# Patient Record
Sex: Male | Born: 1945 | Race: White | Hispanic: No | Smoking: Current every day smoker
Health system: Southern US, Community
[De-identification: ages and names within clinical notes are randomized; demographics above are authoritative.]

## PROBLEM LIST (undated history)

## (undated) DIAGNOSIS — F1010 Alcohol abuse, uncomplicated: Secondary | ICD-10-CM

## (undated) DIAGNOSIS — F0390 Unspecified dementia without behavioral disturbance: Secondary | ICD-10-CM

## (undated) DIAGNOSIS — J449 Chronic obstructive pulmonary disease, unspecified: Secondary | ICD-10-CM

## (undated) DIAGNOSIS — E46 Unspecified protein-calorie malnutrition: Secondary | ICD-10-CM

## (undated) DIAGNOSIS — K7460 Unspecified cirrhosis of liver: Secondary | ICD-10-CM

## (undated) DIAGNOSIS — C140 Malignant neoplasm of pharynx, unspecified: Secondary | ICD-10-CM

## (undated) DIAGNOSIS — B1920 Unspecified viral hepatitis C without hepatic coma: Secondary | ICD-10-CM

## (undated) HISTORY — PX: TONSILLECTOMY: SUR1361

## (undated) HISTORY — PX: VASECTOMY: SHX75

---

## 2006-09-09 ENCOUNTER — Emergency Department: Payer: Self-pay | Admitting: Emergency Medicine

## 2009-09-26 ENCOUNTER — Inpatient Hospital Stay: Payer: Self-pay | Admitting: Psychiatry

## 2009-10-01 ENCOUNTER — Ambulatory Visit: Payer: Self-pay | Admitting: Unknown Physician Specialty

## 2011-03-28 LAB — TSH: Thyroid Stimulating Horm: 4.84 u[IU]/mL — ABNORMAL HIGH

## 2011-03-28 LAB — ACETAMINOPHEN LEVEL: Acetaminophen: <2 ug/mL

## 2011-03-28 LAB — URINALYSIS, COMPLETE
Bacteria: NONE SEEN
Blood: NEGATIVE
Nitrite: NEGATIVE
Ph: 5 (ref 4.5–8.0)
Specific Gravity: 1.025 (ref 1.003–1.030)
Squamous Epithelial: 1

## 2011-03-28 LAB — CBC
HCT: 43.2 % (ref 40.0–52.0)
HGB: 15.1 g/dL (ref 13.0–18.0)
MCH: 34.4 pg — ABNORMAL HIGH (ref 26.0–34.0)
MCHC: 35 g/dL (ref 32.0–36.0)
MCV: 98 fL (ref 80–100)
RDW: 14.3 % (ref 11.5–14.5)
WBC: 6.2 10*3/uL (ref 3.8–10.6)

## 2011-03-28 LAB — COMPREHENSIVE METABOLIC PANEL
Alkaline Phosphatase: 91 U/L (ref 50–136)
Anion Gap: 15 (ref 7–16)
BUN: 6 mg/dL — ABNORMAL LOW (ref 7–18)
Bilirubin,Total: 1.3 mg/dL — ABNORMAL HIGH (ref 0.2–1.0)
Chloride: 96 mmol/L — ABNORMAL LOW (ref 98–107)
Creatinine: 0.67 mg/dL (ref 0.60–1.30)
EGFR (African American): >60
EGFR (Non-African Amer.): >60
Osmolality: 262 (ref 275–301)
SGOT(AST): 292 U/L — ABNORMAL HIGH (ref 15–37)
SGPT (ALT): 195 U/L — ABNORMAL HIGH
Total Protein: 8.2 g/dL (ref 6.4–8.2)

## 2011-03-28 LAB — DRUG SCREEN, URINE
Barbiturates, Ur Screen: NEGATIVE (ref ?–200)
Benzodiazepine, Ur Scrn: NEGATIVE (ref ?–200)
Cannabinoid 50 Ng, Ur ~~LOC~~: POSITIVE (ref ?–50)
Methadone, Ur Screen: NEGATIVE (ref ?–300)
Opiate, Ur Screen: NEGATIVE (ref ?–300)
Phencyclidine (PCP) Ur S: NEGATIVE (ref ?–25)

## 2011-03-28 LAB — ETHANOL
Ethanol %: 0.054 % (ref 0.000–0.080)
Ethanol: 54 mg/dL

## 2011-03-28 LAB — SALICYLATE LEVEL: Salicylates, Serum: 3.2 mg/dL — ABNORMAL HIGH

## 2011-03-29 ENCOUNTER — Inpatient Hospital Stay: Payer: Self-pay | Admitting: Psychiatry

## 2011-04-02 LAB — BASIC METABOLIC PANEL WITH GFR
Anion Gap: 7
BUN: 7 mg/dL
Calcium, Total: 8.8 mg/dL
Chloride: 102 mmol/L
Co2: 26 mmol/L
Creatinine: 0.63 mg/dL
EGFR (African American): >60
EGFR (Non-African Amer.): >60
Glucose: 95 mg/dL
Osmolality: 268
Potassium: 3.3 mmol/L — ABNORMAL LOW
Sodium: 135 mmol/L — ABNORMAL LOW

## 2011-04-02 LAB — HEPATIC FUNCTION PANEL A (ARMC)
Bilirubin,Total: 1.1 mg/dL — ABNORMAL HIGH (ref 0.2–1.0)
SGOT(AST): 164 U/L — ABNORMAL HIGH (ref 15–37)
SGPT (ALT): 144 U/L — ABNORMAL HIGH

## 2011-04-02 LAB — TSH: Thyroid Stimulating Horm: 7.37 u[IU]/mL — ABNORMAL HIGH

## 2011-04-13 ENCOUNTER — Emergency Department: Payer: Self-pay | Admitting: Emergency Medicine

## 2011-04-13 LAB — COMPREHENSIVE METABOLIC PANEL
Albumin: 3 g/dL — ABNORMAL LOW (ref 3.4–5.0)
Alkaline Phosphatase: 119 U/L (ref 50–136)
Anion Gap: 10 (ref 7–16)
BUN: 4 mg/dL — ABNORMAL LOW (ref 7–18)
Bilirubin,Total: 0.6 mg/dL (ref 0.2–1.0)
Calcium, Total: 8.4 mg/dL — ABNORMAL LOW (ref 8.5–10.1)
Co2: 25 mmol/L (ref 21–32)
EGFR (Non-African Amer.): >60
Glucose: 129 mg/dL — ABNORMAL HIGH (ref 65–99)
SGPT (ALT): 106 U/L — ABNORMAL HIGH
Sodium: 143 mmol/L (ref 136–145)
Total Protein: 6.9 g/dL (ref 6.4–8.2)

## 2011-04-13 LAB — URINALYSIS, COMPLETE
Bacteria: NONE SEEN
Blood: NEGATIVE
Leukocyte Esterase: NEGATIVE
Nitrite: NEGATIVE
Ph: 5 (ref 4.5–8.0)
RBC,UR: 1 /HPF (ref 0–5)
Specific Gravity: 1.023 (ref 1.003–1.030)

## 2011-04-13 LAB — DRUG SCREEN, URINE
Barbiturates, Ur Screen: NEGATIVE (ref ?–200)
Benzodiazepine, Ur Scrn: NEGATIVE (ref ?–200)
Cannabinoid 50 Ng, Ur ~~LOC~~: NEGATIVE (ref ?–50)
Methadone, Ur Screen: NEGATIVE (ref ?–300)
Opiate, Ur Screen: NEGATIVE (ref ?–300)
Phencyclidine (PCP) Ur S: NEGATIVE (ref ?–25)
Tricyclic, Ur Screen: NEGATIVE (ref ?–1000)

## 2011-04-13 LAB — TSH: Thyroid Stimulating Horm: 7.03 u[IU]/mL — ABNORMAL HIGH

## 2011-04-13 LAB — CBC
MCH: 34.1 pg — ABNORMAL HIGH (ref 26.0–34.0)
Platelet: 299 10*3/uL (ref 150–440)
RBC: 3.81 10*6/uL — ABNORMAL LOW (ref 4.40–5.90)
RDW: 14.2 % (ref 11.5–14.5)
WBC: 3.8 10*3/uL (ref 3.8–10.6)

## 2011-10-06 ENCOUNTER — Inpatient Hospital Stay: Payer: Self-pay | Admitting: Unknown Physician Specialty

## 2011-10-06 LAB — COMPREHENSIVE METABOLIC PANEL
Albumin: 3.1 g/dL — ABNORMAL LOW (ref 3.4–5.0)
Anion Gap: 6 — ABNORMAL LOW (ref 7–16)
BUN: 6 mg/dL — ABNORMAL LOW (ref 7–18)
Calcium, Total: 8.7 mg/dL (ref 8.5–10.1)
Chloride: 99 mmol/L (ref 98–107)
Creatinine: 0.89 mg/dL (ref 0.60–1.30)
EGFR (African American): >60
Glucose: 94 mg/dL (ref 65–99)
Potassium: 3.1 mmol/L — ABNORMAL LOW (ref 3.5–5.1)
SGOT(AST): 197 U/L — ABNORMAL HIGH (ref 15–37)
Sodium: 135 mmol/L — ABNORMAL LOW (ref 136–145)

## 2011-10-06 LAB — DRUG SCREEN, URINE
Amphetamines, Ur Screen: NEGATIVE (ref ?–1000)
Barbiturates, Ur Screen: NEGATIVE (ref ?–200)
Cannabinoid 50 Ng, Ur ~~LOC~~: POSITIVE (ref ?–50)
Cocaine Metabolite,Ur ~~LOC~~: POSITIVE (ref ?–300)
Methadone, Ur Screen: NEGATIVE (ref ?–300)
Opiate, Ur Screen: NEGATIVE (ref ?–300)
Phencyclidine (PCP) Ur S: NEGATIVE (ref ?–25)
Tricyclic, Ur Screen: NEGATIVE (ref ?–1000)

## 2011-10-06 LAB — CBC
HGB: 13.7 g/dL (ref 13.0–18.0)
MCH: 34.6 pg — ABNORMAL HIGH (ref 26.0–34.0)
MCV: 101 fL — ABNORMAL HIGH (ref 80–100)
Platelet: 158 10*3/uL (ref 150–440)
RBC: 3.95 10*6/uL — ABNORMAL LOW (ref 4.40–5.90)

## 2011-10-06 LAB — URINALYSIS, COMPLETE
Bilirubin,UR: NEGATIVE
Blood: NEGATIVE
Ketone: NEGATIVE
Ph: 6 (ref 4.5–8.0)
Squamous Epithelial: NONE SEEN

## 2011-10-06 LAB — ETHANOL: Ethanol: <3 mg/dL

## 2011-10-06 LAB — TROPONIN I: Troponin-I: <0.02 ng/mL

## 2011-10-06 LAB — SALICYLATE LEVEL: Salicylates, Serum: <1.7 mg/dL

## 2011-10-06 LAB — ACETAMINOPHEN LEVEL: Acetaminophen: <2 ug/mL

## 2012-08-29 ENCOUNTER — Inpatient Hospital Stay: Payer: Self-pay | Admitting: Family Medicine

## 2012-08-29 LAB — COMPREHENSIVE METABOLIC PANEL
Anion Gap: 17 — ABNORMAL HIGH (ref 7–16)
BUN: 7 mg/dL (ref 7–18)
Bilirubin,Total: 4.3 mg/dL — ABNORMAL HIGH (ref 0.2–1.0)
Calcium, Total: 8.3 mg/dL — ABNORMAL LOW (ref 8.5–10.1)
Chloride: 91 mmol/L — ABNORMAL LOW (ref 98–107)
Creatinine: 0.71 mg/dL (ref 0.60–1.30)
EGFR (African American): >60
EGFR (Non-African Amer.): >60
Glucose: 73 mg/dL (ref 65–99)
SGPT (ALT): 64 U/L (ref 12–78)
Sodium: 126 mmol/L — ABNORMAL LOW (ref 136–145)
Total Protein: 7.5 g/dL (ref 6.4–8.2)

## 2012-08-29 LAB — URINALYSIS, COMPLETE
Blood: NEGATIVE
Hyaline Cast: 4
Leukocyte Esterase: NEGATIVE
Ph: 6 (ref 4.5–8.0)
Protein: 30
RBC,UR: 2 /HPF (ref 0–5)
Specific Gravity: 1.026 (ref 1.003–1.030)
Squamous Epithelial: <1

## 2012-08-29 LAB — DRUG SCREEN, URINE
Benzodiazepine, Ur Scrn: NEGATIVE (ref ?–200)
Cannabinoid 50 Ng, Ur ~~LOC~~: NEGATIVE (ref ?–50)
Cocaine Metabolite,Ur ~~LOC~~: NEGATIVE (ref ?–300)
MDMA (Ecstasy)Ur Screen: NEGATIVE (ref ?–500)
Methadone, Ur Screen: NEGATIVE (ref ?–300)
Opiate, Ur Screen: NEGATIVE (ref ?–300)
Phencyclidine (PCP) Ur S: NEGATIVE (ref ?–25)

## 2012-08-29 LAB — CBC
HCT: 34.6 % — ABNORMAL LOW (ref 40.0–52.0)
MCHC: 35.2 g/dL (ref 32.0–36.0)
Platelet: 86 10*3/uL — ABNORMAL LOW (ref 150–440)
RBC: 3.33 10*6/uL — ABNORMAL LOW (ref 4.40–5.90)
WBC: 7.1 10*3/uL (ref 3.8–10.6)

## 2012-08-29 LAB — TSH: Thyroid Stimulating Horm: 3.03 u[IU]/mL

## 2012-08-29 LAB — LIPASE, BLOOD: Lipase: 191 U/L (ref 73–393)

## 2012-08-29 LAB — TROPONIN I: Troponin-I: <0.02 ng/mL

## 2012-08-29 LAB — ETHANOL
Ethanol %: 0.063 % (ref 0.000–0.080)
Ethanol: 63 mg/dL

## 2012-08-30 LAB — BASIC METABOLIC PANEL
BUN: 9 mg/dL (ref 7–18)
Co2: 25 mmol/L (ref 21–32)
Creatinine: 0.76 mg/dL (ref 0.60–1.30)
EGFR (African American): >60
EGFR (Non-African Amer.): >60
Osmolality: 261 (ref 275–301)
Potassium: 3.5 mmol/L (ref 3.5–5.1)

## 2012-08-30 LAB — CBC WITH DIFFERENTIAL/PLATELET
Basophil %: 0.5 %
Eosinophil #: 0 10*3/uL (ref 0.0–0.7)
Eosinophil %: 0.6 %
HCT: 29.8 % — ABNORMAL LOW (ref 40.0–52.0)
HGB: 10.6 g/dL — ABNORMAL LOW (ref 13.0–18.0)
Lymphocyte %: 25 %
MCH: 37.1 pg — ABNORMAL HIGH (ref 26.0–34.0)
MCHC: 35.5 g/dL (ref 32.0–36.0)
Monocyte #: 0.7 x10 3/mm (ref 0.2–1.0)
Neutrophil #: 3.1 10*3/uL (ref 1.4–6.5)
Platelet: 74 10*3/uL — ABNORMAL LOW (ref 150–440)
RBC: 2.86 10*6/uL — ABNORMAL LOW (ref 4.40–5.90)
RDW: 13.7 % (ref 11.5–14.5)

## 2012-08-30 LAB — PROTIME-INR
INR: 2
Prothrombin Time: 22.2 secs — ABNORMAL HIGH (ref 11.5–14.7)

## 2012-08-30 LAB — HEPATIC FUNCTION PANEL A (ARMC)
Albumin: 2.1 g/dL — ABNORMAL LOW (ref 3.4–5.0)
Alkaline Phosphatase: 123 U/L (ref 50–136)
SGOT(AST): 102 U/L — ABNORMAL HIGH (ref 15–37)
Total Protein: 6.4 g/dL (ref 6.4–8.2)

## 2012-08-30 LAB — AMMONIA: Ammonia, Plasma: 47 mcmol/L — ABNORMAL HIGH (ref 11–32)

## 2012-08-31 LAB — CBC WITH DIFFERENTIAL/PLATELET
Basophil #: 0 10*3/uL (ref 0.0–0.1)
Basophil %: 0.4 %
Eosinophil #: 0 10*3/uL (ref 0.0–0.7)
Eosinophil %: 0.5 %
HGB: 11.4 g/dL — ABNORMAL LOW (ref 13.0–18.0)
Lymphocyte #: 0.8 10*3/uL — ABNORMAL LOW (ref 1.0–3.6)
MCH: 36.9 pg — ABNORMAL HIGH (ref 26.0–34.0)
Neutrophil %: 68.4 %
RBC: 3.08 10*6/uL — ABNORMAL LOW (ref 4.40–5.90)
RDW: 14 % (ref 11.5–14.5)
WBC: 4.2 10*3/uL (ref 3.8–10.6)

## 2012-08-31 LAB — SODIUM: Sodium: 134 mmol/L — ABNORMAL LOW (ref 136–145)

## 2012-08-31 LAB — PROTIME-INR: INR: 1.8

## 2012-09-04 LAB — CBC WITH DIFFERENTIAL/PLATELET
Basophil #: 0.1 10*3/uL (ref 0.0–0.1)
Eosinophil %: 1.5 %
HCT: 33.4 % — ABNORMAL LOW (ref 40.0–52.0)
HGB: 11.5 g/dL — ABNORMAL LOW (ref 13.0–18.0)
Lymphocyte %: 34.1 %
MCH: 36.8 pg — ABNORMAL HIGH (ref 26.0–34.0)
MCHC: 34.3 g/dL (ref 32.0–36.0)
MCV: 107 fL — ABNORMAL HIGH (ref 80–100)
Monocyte #: 1.4 x10 3/mm — ABNORMAL HIGH (ref 0.2–1.0)
Neutrophil #: 3.1 10*3/uL (ref 1.4–6.5)
WBC: 7.1 10*3/uL (ref 3.8–10.6)

## 2012-09-04 LAB — BASIC METABOLIC PANEL
Anion Gap: 6 — ABNORMAL LOW (ref 7–16)
Chloride: 109 mmol/L — ABNORMAL HIGH (ref 98–107)
Creatinine: 0.72 mg/dL (ref 0.60–1.30)
Glucose: 108 mg/dL — ABNORMAL HIGH (ref 65–99)
Potassium: 3.5 mmol/L (ref 3.5–5.1)
Sodium: 138 mmol/L (ref 136–145)

## 2012-09-05 LAB — PLATELET COUNT: Platelet: 106 10*3/uL — ABNORMAL LOW (ref 150–440)

## 2012-09-05 LAB — PROTIME-INR: Prothrombin Time: 21.2 secs — ABNORMAL HIGH (ref 11.5–14.7)

## 2012-09-06 LAB — BASIC METABOLIC PANEL
Co2: 23 mmol/L (ref 21–32)
Creatinine: 0.66 mg/dL (ref 0.60–1.30)
EGFR (African American): >60
EGFR (Non-African Amer.): >60
Glucose: 94 mg/dL (ref 65–99)
Osmolality: 267 (ref 275–301)
Sodium: 135 mmol/L — ABNORMAL LOW (ref 136–145)

## 2012-09-06 LAB — BODY FLUID CELL COUNT WITH DIFFERENTIAL
Basophil: 0 %
Neutrophils: 5 %
Other Cells BF: 0 %

## 2012-09-06 LAB — PROTIME-INR
INR: 1.8
Prothrombin Time: 20.3 secs — ABNORMAL HIGH (ref 11.5–14.7)

## 2012-09-06 LAB — PROTEIN, BODY FLUID: Protein, Body Fluid: 0.8 g/dL

## 2012-09-06 LAB — ALBUMIN, FLUID (OTHER): Body Fluid Albumin: <0.6 g/dL

## 2012-09-07 LAB — PROTIME-INR
INR: 1.7
Prothrombin Time: 19.5 secs — ABNORMAL HIGH (ref 11.5–14.7)

## 2012-09-07 LAB — APTT: Activated PTT: 37.9 secs — ABNORMAL HIGH (ref 23.6–35.9)

## 2012-09-22 ENCOUNTER — Inpatient Hospital Stay: Payer: Self-pay | Admitting: Internal Medicine

## 2012-09-22 LAB — URINALYSIS, COMPLETE
Bacteria: NONE SEEN
Blood: NEGATIVE
Glucose,UR: NEGATIVE mg/dL (ref 0–75)
Nitrite: NEGATIVE
RBC,UR: 5 /HPF (ref 0–5)

## 2012-09-22 LAB — COMPREHENSIVE METABOLIC PANEL
Albumin: 1.8 g/dL — ABNORMAL LOW (ref 3.4–5.0)
Alkaline Phosphatase: 154 U/L — ABNORMAL HIGH (ref 50–136)
Anion Gap: 3 — ABNORMAL LOW (ref 7–16)
BUN: 6 mg/dL — ABNORMAL LOW (ref 7–18)
Bilirubin,Total: 0.9 mg/dL (ref 0.2–1.0)
Calcium, Total: 8.4 mg/dL — ABNORMAL LOW (ref 8.5–10.1)
Chloride: 105 mmol/L (ref 98–107)
Co2: 29 mmol/L (ref 21–32)
Creatinine: 0.71 mg/dL (ref 0.60–1.30)
EGFR (African American): >60
Glucose: 97 mg/dL (ref 65–99)
Osmolality: 271 (ref 275–301)
Potassium: 3.7 mmol/L (ref 3.5–5.1)
Sodium: 137 mmol/L (ref 136–145)

## 2012-09-22 LAB — CBC
MCH: 36.5 pg — ABNORMAL HIGH (ref 26.0–34.0)
Platelet: 181 10*3/uL (ref 150–440)
RDW: 14.7 % — ABNORMAL HIGH (ref 11.5–14.5)
WBC: 7.6 10*3/uL (ref 3.8–10.6)

## 2012-09-22 LAB — PROTIME-INR
INR: 1.4
Prothrombin Time: 17.5 secs — ABNORMAL HIGH (ref 11.5–14.7)

## 2012-09-22 LAB — ETHANOL
Ethanol %: <0.003 % (ref 0.000–0.080)
Ethanol: <3 mg/dL

## 2012-09-22 LAB — MAGNESIUM: Magnesium: 1.8 mg/dL

## 2012-09-22 LAB — TROPONIN I: Troponin-I: <0.02 ng/mL

## 2012-09-23 LAB — CBC WITH DIFFERENTIAL/PLATELET
Basophil #: 0 10*3/uL (ref 0.0–0.1)
Eosinophil #: 0.1 10*3/uL (ref 0.0–0.7)
HCT: 30.1 % — ABNORMAL LOW (ref 40.0–52.0)
Lymphocyte #: 2.2 10*3/uL (ref 1.0–3.6)
Lymphocyte %: 33.1 %
MCH: 36.6 pg — ABNORMAL HIGH (ref 26.0–34.0)
MCV: 105 fL — ABNORMAL HIGH (ref 80–100)
Monocyte %: 15.7 %
Neutrophil #: 3.3 10*3/uL (ref 1.4–6.5)
Platelet: 147 10*3/uL — ABNORMAL LOW (ref 150–440)
RDW: 14.5 % (ref 11.5–14.5)
WBC: 6.8 10*3/uL (ref 3.8–10.6)

## 2012-09-23 LAB — COMPREHENSIVE METABOLIC PANEL
Albumin: 2.1 g/dL — ABNORMAL LOW (ref 3.4–5.0)
Alkaline Phosphatase: 120 U/L (ref 50–136)
Calcium, Total: 8.3 mg/dL — ABNORMAL LOW (ref 8.5–10.1)
Chloride: 105 mmol/L (ref 98–107)
Co2: 30 mmol/L (ref 21–32)
EGFR (African American): >60
Potassium: 3.8 mmol/L (ref 3.5–5.1)
SGPT (ALT): 45 U/L (ref 12–78)
Total Protein: 6 g/dL — ABNORMAL LOW (ref 6.4–8.2)

## 2012-09-23 LAB — LIPID PANEL
Cholesterol: 91 mg/dL (ref 0–200)
HDL Cholesterol: 18 mg/dL — ABNORMAL LOW (ref 40–60)
Ldl Cholesterol, Calc: 64 mg/dL (ref 0–100)
Triglycerides: 45 mg/dL (ref 0–200)
VLDL Cholesterol, Calc: 9 mg/dL (ref 5–40)

## 2012-09-23 LAB — PROTIME-INR: Prothrombin Time: 19.6 secs — ABNORMAL HIGH (ref 11.5–14.7)

## 2012-09-23 LAB — TSH: Thyroid Stimulating Horm: 9.54 u[IU]/mL — ABNORMAL HIGH

## 2012-09-23 LAB — MAGNESIUM: Magnesium: 2 mg/dL

## 2012-09-24 LAB — BASIC METABOLIC PANEL
Anion Gap: 6 — ABNORMAL LOW (ref 7–16)
Calcium, Total: 8.5 mg/dL (ref 8.5–10.1)
EGFR (African American): >60
EGFR (Non-African Amer.): >60
Osmolality: 273 (ref 275–301)
Potassium: 3.7 mmol/L (ref 3.5–5.1)

## 2012-09-24 LAB — SODIUM, URINE, RANDOM: Sodium, Urine Random: 148 mmol/L (ref 20–110)

## 2012-09-24 LAB — PROTIME-INR: INR: 1.6

## 2012-09-25 LAB — PLATELET COUNT: Platelet: 155 10*3/uL (ref 150–440)

## 2012-09-25 LAB — PROTIME-INR
INR: 1.6
Prothrombin Time: 19.2 secs — ABNORMAL HIGH (ref 11.5–14.7)

## 2012-09-25 LAB — BASIC METABOLIC PANEL
Calcium, Total: 8.2 mg/dL — ABNORMAL LOW (ref 8.5–10.1)
Chloride: 103 mmol/L (ref 98–107)
Co2: 29 mmol/L (ref 21–32)
Creatinine: 0.61 mg/dL (ref 0.60–1.30)
EGFR (African American): >60
Glucose: 95 mg/dL (ref 65–99)
Osmolality: 267 (ref 275–301)
Potassium: 3.7 mmol/L (ref 3.5–5.1)

## 2012-09-25 LAB — HEMOGLOBIN: HGB: 10.3 g/dL — ABNORMAL LOW (ref 13.0–18.0)

## 2012-09-26 LAB — BASIC METABOLIC PANEL
Chloride: 101 mmol/L (ref 98–107)
Co2: 31 mmol/L (ref 21–32)
Creatinine: 0.71 mg/dL (ref 0.60–1.30)
EGFR (African American): >60
EGFR (Non-African Amer.): >60
Glucose: 85 mg/dL (ref 65–99)
Osmolality: 270 (ref 275–301)
Sodium: 137 mmol/L (ref 136–145)

## 2012-09-26 LAB — PROTIME-INR
INR: 1.4
Prothrombin Time: 17.6 secs — ABNORMAL HIGH (ref 11.5–14.7)

## 2012-09-27 LAB — MAGNESIUM: Magnesium: 1.4 mg/dL — ABNORMAL LOW

## 2012-09-27 LAB — POTASSIUM: Potassium: 3.7 mmol/L (ref 3.5–5.1)

## 2013-02-03 ENCOUNTER — Ambulatory Visit: Payer: Self-pay | Admitting: Unknown Physician Specialty

## 2013-05-17 ENCOUNTER — Ambulatory Visit: Payer: Self-pay | Admitting: Unknown Physician Specialty

## 2013-12-28 ENCOUNTER — Ambulatory Visit: Payer: Self-pay | Admitting: Unknown Physician Specialty

## 2014-06-05 NOTE — Discharge Summary (Signed)
PATIENT NAME:  [name], [name] MR#:  [id number] DATE OF BIRTH:  1945-04-11  DATE OF ADMISSION:  10/06/2011 DATE OF DISCHARGE:  10/15/2011  HISTORY OF PRESENT ILLNESS: The patient was admitted with alcohol dependence. For further details, please see typed attached History and Physical.   ACCESSORY CLINICAL DATA: Acetaminophen was negative. BUN, creatinine, and electrolytes within normal limits with sodium 135, potassium 3.1. Liver function tests: Slightly elevated alkaline phosphatase at 138, SGPT 142, SGOT 197, albumin 3.1, otherwise liver function tests within normal limits. Calcium within normal limits. Ethanol blood level was less than 0.03.  CBC showed hemoglobin 13.7, hematocrit 39.8, otherwise within normal limits. Salicylates less than 1.7. TSH 4.1, within normal limits. Drug screen positive for cocaine and cannabinoids.  Urinalysis is within normal limits. Troponin less than 0.2? EKG was within normal limits.   HOSPITAL COURSE: The patient was withdrawn from alcohol using Ativan, thiamine, vitamins, etc.  Alcohol withdrawal with somewhat prolonged primarily because of dystaxia which had about subsided by the time of discharge. At the time of discharge he was no longer dystaxic or shaky. He agreed to follow up at mental health, did not desire inpatient rehab. He did admit to using cocaine and marijuana, but he reports just doing it rarely.   DIAGNOSES:  AXIS I:  Alcohol dependence, severe.  Marijuana and cocaine abuse.   AXIS III: Alcohol withdrawal state. Alcoholic liver disease.   CONDITION ON DISCHARGE: Good.   DISPOSITION: The patient will be seen at [facility]. He will follow up with his medical doctor.  MEDICATIONS:  No medications.     DIET AND ACTIVITY: As tolerated.     ____________________________ Tanna Furry, MD wjr:bjt D: 10/14/2011 15:12:24 ET T: 10/15/2011 12:05:42 ET JOB#: [id number]  cc: Tanna Furry, MD, <Dictator> Lew Dawes MD ELECTRONICALLY SIGNED 10/16/2011 14:50

## 2014-06-05 NOTE — Consult Note (Signed)
Patient seen at request of Baldo Ash, RN, LCAS for evaluation for participation in the CD-IOP after this inpatient Discharge. He reported that he has Antabuse at home and that he plans to use it to assist with alcoholic beverage free life. He was also able to express that he does not desire treatment in the CD-IOP at this time and also does not have the transportation to travel to [facility]. Persons with substance use disorders who are not motivated to change may not benefit from or participate in intensive treatment interventions unless their motivation improves. Such was the case with this patient. Assigned nursed informed of patient?s decision to not attend the CD-IOP.    Electronic Signatures: Laqueta Due (PsyD)  (Signed on 29-Aug-13 14:14)  Authored  Last Updated: 29-Aug-13 14:14 by Laqueta Due (PsyD)

## 2014-06-05 NOTE — H&P (Signed)
PATIENT NAME:  [name], [name] MR#:  [id number] DATE OF BIRTH:  [birth year]  DATE OF ADMISSION:  10/06/2011  CHIEF COMPLAINT: Drinking.   HISTORY OF PRESENT ILLNESS: This is one of several psychiatric hospitalizations for this 69 year old white single male who is admitted from the Emergency Room. The patient has a long-term substance abuse history, primarily alcohol, and has had periods of sobriety in the past. He was last hospitalized here in February of 2013, at that time discharged on Antabuse, which he subsequently stopped taking. He has taken naltrexone in the past. He, again, has had periods of time of up to a year of sobriety.    The patient came into the Emergency Room last night stating that he lives in his mother's house. She is in a nursing home. His sister took him to [place], who gave him a bunch of doctors' numbers to call. The patient was then brought to the Emergency Room. In the Emergency Room last night, he stated he was out of money. He drinks 12 beers per day, needs some nerve pills and some detox. The patient ran out of money and food, and his sister brought him to the Emergency Room. The patient also has some history of cocaine and marijuana abuse, and they are both positive in his urine, and this may be playing a factor.   Anyway, it is estimated that he has been drinking a 12-pack a day for some time, perhaps since last March or February. He does have blackouts. He has history of DWIs. He was in jail for six months for DWI, getting out in January of 2013, and he does have delirium when coming off alcohol. The patient also becomes extremely drowsy and noncommunicative when coming off alcohol. This has been reported in previous charts.   FAMILY HISTORY: Not known at this time. The patient is currently unable to give me a history as he is getting quite sleepy and is slightly disheveled. Nurses do report that this morning he was also incontinent as well as dystaxic with gait. He has been  given Ativan 2 mg at least 2 or 3 times.   PAST MEDICAL HISTORY: Past history is fairly unremarkable. He has a history of alcoholic hepatitis, and that apparently is all. He takes no current medications.   REVIEW OF SYSTEMS: Review of systems, according to the patient, is negative; but it is very difficult as he is a very poor historian at this point in time.  SOCIAL HISTORY: The patient currently lives in his mother's house by himself. He does get Fish farm manager. He is not married and apparently has no children. He does smoke cigarettes as well as abusing drugs.   ALLERGIES: Penicillin causes hives.   MENTAL STATUS EXAM: Mental status at this time revealed a drowsy white male. He could be aroused and awakened but was slightly irritable and was a very poor historian at this time. It was difficult to test for orientation, memory; and he did not appear to be hallucinating and appeared perhaps slightly having some difficulty with attention. He a couple of times he just wanted to sleep, and he was again slightly hard to arouse.   PHYSICAL EXAMINATION:  VITAL SIGNS: Vital signs at this time are stable with normal blood pressure, pulse and respirations.   HEENT: Pupils equal, round, and reactive to light and accommodation. Throat is clear.   NECK: Supple without masses.   CHEST: Clear.   CARDIAC: Markedly distant heart sounds.   ABDOMEN: Soft without tenderness,  masses, or organomegaly. Bowel sounds are hypoactive.   NEUROLOGICAL: The patient had sustention tremor on finger-to-nose. I did not test gait because I could not get him up. Reflexes were 2 to 3+ and symmetrical with no pathological reflexes.   IMPRESSION:  AXIS I:  1. Alcohol dependence, severe. 2. Marijuana abuse.  3. Cocaine abuse.   AXIS II: None.    AXIS III:  1. Alcohol withdrawal state.   2. History of alcoholic liver disease.   AXIS IV: Living alone.   AXIS V: 20. The patient is going through withdrawal at this  point in time.   ASSESSMENT AND PLAN: The patient will be continued on CIWA. We will watch giving too much Ativan, but he is going to need some because he has got a history of DTs in the past, and we will try to monitor his vital signs.   ____________________________ Tanna Furry, MD wjr:cbb D: 10/07/2011 12:05:04 ET T: 10/07/2011 12:22:18 ET JOB#: [id number]  cc: Tanna Furry, MD, <Dictator> Lew Dawes MD ELECTRONICALLY SIGNED 10/07/2011 16:39

## 2014-06-08 NOTE — H&P (Signed)
PATIENT NAME:  [name], [name] MR#:  [id number] DATE OF BIRTH:  21-Aug-1945  DATE OF ADMISSION:  08/29/2012  PRIMARY CAR PHYSICIAN:  None.    CHIEF COMPLAINT: I want to get help for alcohol.   HISTORY OF PRESENT ILLNESS: This is a 69 year old man who came in wanting to get help for alcohol. He drinks a 12 pack of beer per day and a few bottles of wine per day. He states that he does have some frequent falls, especially when he is not drinking. In the ER, he was found to have a low sodium of 126 and acidotic, with CO2 18 and an anion gap 17. The ER physician asked for medical admission because of the acidosis and hyponatremia. They also did place a Foley in the Emergency Room secondary to urinary retention, but only 100 mL came out. The patient is having a lot of pain with that and wants removed.   PAST MEDICAL HISTORY: Alcohol abuse. Frequent falls, anxiety.   PAST SURGICAL HISTORY: On the palate bone.   ALLERGIES: PENICILLIN.   MEDICATIONS ON A DAILY BASIS:  None.   SOCIAL HISTORY: Smokes a 1+ pack per day. Alcohol: Drinks a 12 pack of beer per day plus a few bottles of wine. Drug use: Occasional crack cocaine. He does live alone. Used to work as a Brewing technologist.   FAMILY HISTORY: Father died of heart disease, also had chronic obstructive pulmonary disease. Mother and living in a nursing home with dementia.   REVIEW OF SYSTEMS:  CONSTITUTIONAL: Positive for weight loss. No fever, chills, or sweats. No weakness or fatigue.  EYES: He does wear glasses.  EARS, NOSE, MOUTH, AND THROAT: Decreased hearing. No sore throat. No difficulty swallowing.   CARDIOVASCULAR: No chest pain. No palpitation.  RESPIRATORY: No shortness of breath. No sputum. No hemoptysis.  GASTROINTESTINAL: No nausea. No vomiting. No abdominal pain. No diarrhea. No constipation. No bright red blood per rectum.  GENITOURINARY: Last night and today had some difficulty urinating.  MUSCULOSKELETAL: No joint pain.  INTEGUMENT:  No rashes or eruptions.  NEUROLOGIC: No fainting or blackouts, but unsteady gait with frequent falls when not drinking.  PSYCHIATRIC: Positive for anxiety.  ENDOCRINE: No thyroid problems.  HEMATOLOGIC/LYMPHATIC: No anemia.   PHYSICAL EXAMINATION: VITAL SIGNS: Temperature 97.6, pulse 86, respirations 18, blood pressure 128/67, pulse oximetry 98%.  GENERAL: No respiratory distress.  EYES: Conjunctivae and lids normal. Pupils equal, round, and reactive to light. Extraocular muscles intact. No nystagmus.  EARS, NOSE, MOUTH, AND THROAT: Tympanic membranes: No erythema. Nasal mucosa: No erythema. Throat:  No erythema, no exudate seen. Lips and gums: No lesions.  NECK: No JVD. No bruits. No lymphadenopathy. No thyromegaly. No thyroid nodules palpated.  LUNGS: Clear to auscultation. No use of accessory muscles to breathe. No rhonchi, rales, or wheeze heard.  CARDIOVASCULAR: S1, S2 normal. No gallops, rubs or murmurs heard. Carotid upstroke 2+ bilaterally. No bruits. Dorsalis pedis pulses 2+ bilaterally. No edema of lower extremity.  ABDOMEN: Soft, distended. No organomegaly/splenomegaly palpated. Normoactive bowel sounds. No masses felt.  LYMPHATIC: No lymph nodes in the neck.  MUSCULOSKELETAL: No clubbing, edema or cyanosis.  SKIN: Positive bruising upper and lower extremities.  NEUROLOGICAL: Cranial nerves II through XII grossly intact. Power 5/5 upper and lower extremities. Deep tendon reflexes 2+ bilateral lower extremity. Babinski negative.  PSYCHIATRIC: The patient is oriented to person, place and time. The patient states that his alcohol has run out.   LABORATORY AND RADIOLOGICAL DATA: Lipase 191. Troponin negative. TSH  3.03. Ethanol 63, glucose 73, BUN 7, creatinine 0.71, sodium 126, potassium 3.4, chloride 91, CO2 18, calcium 8.3, albumin 2.4, AST 128. White blood cell count 7.1, hemoglobin and hematocrit 12.2 and 34.3, platelet count of 86, MCV 104. Urine toxicology negative.   Urinalysis:  Protein of 30 mg/dL 1+ ketones, 1+ bilirubin.   EKG: Sinus tachycardia at 107 beats per minute.   ASSESSMENT AND PLAN: 1.  Metabolic acidosis. I believe this is all secondary to alcohol. We will give IV fluid hydration and recheck a chemistry in the morning. No need for arterial blood gas at this point.  2.  Hyponatremia secondary to beer. We will give IV fluid hydration with normal saline and continue monitor sodium in the morning.  3. Alcohol abuse. The patient is slowly killing himself with alcohol. I advised if he wants to be aboveground and living, he must stop the alcohol. I will put CIWA protocol since the patient is high risk for going through withdrawal.  4.  Likely cirrhosis with thrombocytopenia and elevated liver function tests. Must stop the alcohol.  5.  Urinary retention. Discontinue Foley since it is paining him,  start Flomax 0.4 mg daily and urecholine to stimulate urination.  6.  Tobacco abuse. Smoking cessation counseling done 3 minutes by me. Nicotine patch applied.  7.  Hypokalemia: We will replace potassium orally stat and at night and also magnesium supplementation and recheck electrolytes in the morning   TIME SPENT ON ADMISSION: 55 minutes.   CODE STATUS: The patient is a full code    ____________________________ Tana Conch. Leslye Peer, MD rjw:cc D: 08/29/2012 17:41:00 ET T: 08/29/2012 17:48:44 ET JOB#: [id number]  cc: Tana Conch. Leslye Peer, MD, <Dictator> Marisue Brooklyn MD ELECTRONICALLY SIGNED 08/30/2012 19:17

## 2014-06-08 NOTE — H&P (Signed)
PATIENT NAME:  [name], [name] MR#:  [id number] DATE OF BIRTH:  1945/03/22  DATE OF ADMISSION:  09/22/2012  PRIMARY CARE PHYSICIAN:  None.    REFERRING ER PHYSICIAN:  Dr. Debria Garret   CHIEF COMPLAINT:  Worsening abdominal distention and pain causing shortness of breath.   HISTORY OF PRESENT ILLNESS:  A 69 year old male with past medical history of alcohol abuse, frequent falls, anxiety, and alcoholic liver cirrhosis, recent admission to the hospital 3 weeks ago for the same issue, was sent to [facility] because of his generalized weakness for rehab and, as per him, his swelling continued getting worse and he had abdominal pain and now he started getting short of breath for the last night. He was sent to the ER from [facility] because of these issues. On further questioning, he denies any complaint of diarrhea, constipation, nausea/vomiting, or having any fever. His pain is constant, which is dull in the abdomen, and his swelling is getting worse. Now it has gone to his scrotum and his legs, and is causing him trouble breathing, too. Hospitalist service is contacted for further management of his case.   REVIEW OF SYSTEMS:  CONSTITUTIONAL: Negative for fever, fatigue. Positive for generalized weakness and abdominal pain. No weight loss. He has increased swelling all over his body.  EYES:  No blurring, double vision, pain or redness.  EARS, NOSE, THROAT:  No tinnitus, ear pain or hearing loss.  RESPIRATORY:  No cough, wheezing, hemoptysis or dyspnea, but he has shallow and slightly rapid breathing.  CARDIOVASCULAR:  No chest pain, orthopnea. He has significant edema in both his legs. High scrotum and abdomen, but no palpitations.  GASTROINTESTINAL:  No nausea, vomiting, diarrhea. Has abdominal pain and distention due to ascites.  GENITOURINARY:  No dysuria, hematuria. He has slightly decreased frequency of the urination.  ENDOCRINE: No polyuria, nocturia or heat or cold intolerance.  SKIN:  No  acne or rashes.  MUSCULOSKELETAL: No pain or swelling in the joints.  NEUROLOGICAL: No numbness, weakness or tremors  PAST MEDICAL HISTORY: Alcohol abuse, frequent falls, anxiety and ascites due to liver cirrhosis.   PAST SURGICAL HISTORY:  Surgery on palate bone.   ALLERGIES:  PENICILLIN.   SOCIAL HISTORY: He smokes 1 pack cigarettes per day. Alcohol, he was drinking a 12-pack of beer every day, plus a few bottles of wine, but since he was admitted last time to hospital and then sent to rehab, he did not have any drink. Occasional use of cocaine before previous admission to the hospital. He was living at home alone before last admission, and since last admission he was sent to [facility] for the last 2 weeks.  FAMILY HISTORY: Father died of heart disease. He had chronic obstructive pulmonary disease also. Mother living in a nursing home with dementia.   HOME MEDICATIONS:  1.  Tamsulosin 0.4 mg oral 2 times a day.  2.  Nicotine 21 mg patch. 3.  Nadolol 20 mg oral tablet 2 times a day.  4.  Multivitamin once a day.  5.  Magnesium oxide 400 mg 2 times a day.  6.  Lactulose 30 mL 2 times a day.  7.  Benzonatate 100 mg oral 4 times a day.   8.  Ativan 0.5 mg every 6 hours as needed for anxiety.   PHYSICAL EXAMINATION: VITAL SIGNS: In the ER, temperature 98.4, pulse 78, respirations 20, blood pressure 105/70, pulse ox 95% on room air.  GENERAL: The patient has pain and  has severe muscle wasting on his face and temporal area. He is alert and oriented to time, place and person, and cooperative with history taking and physical examination. Mild distress due to distended abdomen.  HEENT: Head and neck atraumatic. Conjunctivae pink. Oral mucosa moist.  NECK:  Supple. No JVD.  RESPIRATORY: Bilateral clear and equal air entry. Decreased respiratory effort due to distended abdomen. No rhonchi.  CARDIOVASCULAR:  S1, S2 present. Regular. No murmur.  ABDOMEN:  Distended. Bowel sounds present. No  organomegaly felt due to distention. Dullness present.  SKIN: There is severe edema from abdomen on scrotum, thighs and legs, and this stretched and has shiny skin due to that. No rashes.  NEUROLOGICAL: Moves all 4 limbs. Power 4/5. Generalized weakness. No tremors of limbs.   LAB RESULTS:  Glucose of 97, BNP 411, BUN 6, creatinine 0.71, sodium 137, potassium 3.7, chloride 105, CO2 of 29, calcium 8.4. Total protein 6.7, albumin 1.8, bilirubin 0.9, alkaline phosphatase  154, SGOT 74. Troponin less than 0.02. WBC 7.6, hemoglobin 11.9, platelet count 181, MCV 105. Prothrombin time 17.5, and INR 1.4.   ASSESSMENT AND PLAN:  A 69 year old male who had recent admission to the hospital for ascites, and was sent to rehab. Came back with ascites and abdominal pain, with shortness of breath.   1.  Ascites causing respiratory distress. This is possibly due to alcoholic liver cirrhosis. Will try to get ultrasound-guided ascitic trap tomorrow morning. Currently there are no signs of infection, so will not start on any antibiotics, and will get GI consult for further management for him.  2.  Hypoalbuminemia, generalized edema. Will give him albumin IV, and he might need more albumin tomorrow after getting ascitic tap.  Due to his liver failure and severe ascites,  will check his ammonia level, though right now he does not appear having disorientation, and he does not have any tremors. Will continue him on lactulose.  3.  Urinary retention. He has more than 900 mL of retained urine, as per bladder scan done in ER. I instructed nurse to place Foley catheter in, as he has severe swelling on his scrotum and penis, and Foley would be beneficial until we get rid of swelling. Will continue on tamsulosin meanwhile.   4.  Smoking cessation counseling done for 5 minutes, and nicotine patch will be applied.  5.  Alcohol abuse. Last drink was one month ago. Currently no signs of withdrawal.   6.  Anxiety disorder. Will  continue Ativan on as-needed basis.   His health care power of attorney is his sister, as per him, and he wants to be a full code.   TOTAL TIME SPENT:  50 minutes on this admission.   ____________________________ Ceasar Lund Anselm Jungling, MD vgv:mr D: 09/22/2012 18:02:06 ET T: 09/22/2012 20:03:34 ET JOB#: [id number]  cc: Ceasar Lund. Anselm Jungling, MD, <Dictator> Vaughan Basta MD ELECTRONICALLY SIGNED 10/04/2012 11:26

## 2014-06-08 NOTE — Consult Note (Signed)
PATIENT NAME:  [name], [name] MR#:  [id number] DATE OF BIRTH:  1945-03-15  DATE OF CONSULTATION:  09/23/2012  REFERRING PHYSICIAN: Dr. Anselm Jungling.  CONSULTING PHYSICIAN: Dr. Gaylyn Cheers, MD/Tomisha Reppucci Jerelene Redden, ANP.   REASON FOR CONSULTATION: Ascites with history of cirrhosis.   HISTORY OF PRESENT ILLNESS: This 69 year old patient who was admitted to the hospital from [facility] for worsening ascites, abdominal pain and shortness of breath. The patient was found to have increasing abdominal girth due to ascites and edema to the lower legs. He was hospitalized for further management.   The patient was admitted 08/29/2012 through 09/07/2012 for alcohol withdrawal assistance. He was found to have metabolic acidosis and was detoxed and treated appropriately. He did have an INR of 1.7 and received oral vitamin K that did not work. He required 2 units of FFP. A bedside paracentesis last month revealed 200 mL of fluid, and the culture did not meet criteria for SBP and antibiotic use. The patient was discharged to [facility] on nadolol, lactulose, and his usual medication.   The patient states that yesterday he started to notice shortness of breath and had one of his coughing spells. He says he coughs a lot. He does not bring up any phlegm most of the time. He has noticed no fevers or chills or chest pain. He says his abdomen is getting more tight, and is mildly uncomfortable, but no discrete pain. He reports laxatives makes him have a bowel movement twice a day. He cannot control his bowels often. He is not missing alcohol and reports he has been abstinent for 2 months now. The patient does note edema in the legs and scrotum and that is bothersome to him. He denies abdominal pain, but chart review, reports he has reported a dull, constant ache in the abdomen. The patient denies prior history of EGD.   PAST MEDICAL HISTORY:   1.  Alcoholic cirrhosis with ascites, hypoalbuminemia, coagulopathy,  hyponatremia, history of alcohol withdrawal and delirium tremors, thrombocytopenia and anemia.  2.  Urinary retention.  3.  Alcohol abuse.  4.  Tobacco abuse.  5.  History of fractured right jaw.  6.  History of substance abuse, prior cocaine use.  7.  History of ruptured ulcer.  8.  Alcoholic hepatitis.  9.  Emphysema.    PAST SURGICAL HISTORY:   1.  Appendectomy.  2.  Right jaw fracture with palate bone.   MEDICATIONS ON ADMISSION:  1. Tamsulosin 0.4 mg twice daily.  2.  Nicotine 21 mg patch. 3.  Nadolol 20 mg twice daily.  4.  Multivitamin daily.  5.  Magnesium oxide 400 mg twice daily.  6.  Lactulose 30 mL 2 times daily.  7.  Benzonatate 100 mg  4 times daily.  8.  Ativan 0.5 mg every 6 hours as needed for anxiety.   ALLERGIES: PENICILLIN.   HABITS: Tobacco, 1 pack per day. No longer smoking at [facility]. On nicotine patch.  Alcohol:  Was drinking 12 packs of beer daily plus bottles of wine, reports no alcohol over the last two months. He has had a prior history of marijuana and cocaine use noted in previous chart records.   FAMILY HISTORY: Father deceased with heart disease. Mother with dementia, negative for colon cancer or GI malignancy.   REVIEW OF SYSTEMS: The patient is a poor historian. He does report cough, edema and aggravation with bowel laxatives that cause him to have loose movements twice a day. He currently denies any  abdominal pain, chest pain or shortness of breath. He has noted generalized weakness. He reports good appetite, diet.  GU: He reports of urinary retention, no burning, no blood, Foley catheter kinks at times but he is draining urine and feels better.  MUSCULOSKELETAL: Denies any pain, joint swelling.  SKIN: Denies rashes or pruritus.   LABORATORY:  Admission blood work with BUN 6, creatinine 0.71, sodium 137, glucose 97. B-type natriuretic peptide 411, CO2 29, calcium 8.4, magnesium 1.8, albumin 1.8, alkaline phosphatase 154, total bilirubin  0.9, AST 74, ALT 58. Troponin less than 0.2, hemoglobin 11.9, platelet 181, WBC 7.6. Pro time 17.5, INR 1.4. Urine is hazy amber, 1+ bilirubin, positive for protein, leukocyte esterase and WBCs.   Repeat laboratory studies 09/23/2012 notable for BUN 6, creatinine 0.65, sodium 137, albumin 2.1, total bilirubin 1.1. TSH 9.54, hemoglobin 10.5, platelet 147, pro time 19.6, INR 1.7.   RADIOLOGY:  Three-way of the abdomen including PA chest showed nonspecific gas pattern, mildly distended loops of small bowel filled with air in the mid abdominal region.  Air and fecal material is present in the colon to the rectum without abnormal distention. No free air evident. Ultrasound performed today with intention of paracentesis revealed a moderate amount of abdominal free fluid. The INR was elevated and Radiology recommend correction of INR prior to paracentesis.    PHYSICAL EXAMINATION: VITAL SIGNS: 98.8, 81, 16, 97/61, repeat at 128/80, pulse ox, room air is about 94%, on 1 liter 100%.  GENERAL: Older than stated age. Small framed Caucasian male sitting upright in bed. NAD. The patient is disheveled.  HEENT: Head is normocephalic. Conjunctivae pink. Sclerae anicteric. Oral mucosa is dry and intact.  NECK: Supple without thyromegaly or lymphadenopathy.  CARDIAC: S1 and S2 regular without murmur or gallop.  LUNGS: CTA, however, there is scattered rhonchi noted. The patient has frequent cough noted.  ABDOMEN: Distended. Bowel sounds are present. There is no discrete area of tenderness.  RECTAL: Deferred.  SKIN: Warm and dry, severe edema from the abdomen, scrotum, thighs and legs down to the feet. No rashes no weeping skin noted.  MUSCULOSKELETAL: No joint swelling, inflammation. Gait not evaluated.  NEUROLOGIC: The patient is alert, follows commands weakly, unable to sit up easily in bed. No tremors noted.  PSYCHIATRIC: He is mildly irritable, alert, fair historian.   IMPRESSION:  1.  This patient presents  with advanced alcoholic cirrhosis with significant ascites and peripheral edema.  2.  He has significant hypoalbuminemia, coagulopathy, and has a MELD score of 13 today. Child-Pugh score class C. He is abstinent from alcohol as he had a detox admission last month and has been residing at a skilled nursing facility until this admission.   PLAN: 1.  Began SBP empiric treatment for nonspecific abdominal discomfort and ascites with Levaquin 500 mg IV. The patient is allergic to penicillin so Rocephin cannot be used.  2.  He is not a paracentesis candidate according to Radiology because of his elevated INR. Therefore, we will begin diuretics, furosemide and Aldactone and monitor blood pressure, electrolytes and urine sodium closely for response.  3.  Try vitamin K subcutaneous daily x3. It probably will not improve the INR because of the severity of the liver disease. Continue with proton pump inhibitor daily. This case was discussed with Dr. Vira Agar in collaboration care. Further GI recommendations pending the patient's clinical course.   Thank you for this consultation. These services were provided by Denice Paradise.  ____________________________ Janalyn Harder Jerelene Redden, ANP (Adult Nurse  Practitioner) kam:dp D: 09/23/2012 14:03:49 ET T: 09/23/2012 14:38:43 ET JOB#: [id number]  cc: Joelene Millin A. Jerelene Redden, ANP (Adult Nurse Practitioner), <Dictator> Janalyn Harder Sherlyn Hay, MSN, ANP-BC Adult Nurse Practitioner ELECTRONICALLY SIGNED 09/28/2012 12:15

## 2014-06-08 NOTE — Discharge Summary (Signed)
PATIENT NAME:  [name], [name] MR#:  [id number] DATE OF BIRTH:  [birth year]  DATE OF ADMISSION:  09/22/2012 DATE OF DISCHARGE:  09/27/2012   ADMITTING DIAGNOSIS: Ascites.   DISCHARGE DIAGNOSES:  1. Ascites, lower extremity swelling as well as dyspnea due to ascites.  2. Liver failure with coagulopathy, status post 1 unit of packed red blood cell transfusion. 3. Hepatic encephalopathy.  4. Medical noncompliance.  5. Tobacco and alcohol abuse, ongoing.  6. Anxiety.  7. Falls. 8. Skin erosions.    DISCHARGE CONDITION: Stable.   DISCHARGE MEDICATIONS: The patient is to resume:  1. Ativan 0.5 mg p.o. every 6 hours as needed.  2. Tamsulosin 0.5 mg twice daily.  3. Benzonatate 100 mg p.o. every 4 hours as needed. 4. Nadolol 20 mg p.o. twice daily. 5. Lactulose 30 mL twice daily.  6. NicoDerm transdermal film 21 mg topically daily.  7. Multivitamins 1 daily.  8. Melatonin 3 mg p.o. at bedtime as needed.  9. Guaifenesin 100 mg in 5 mL oral liquid 20 mL 4 times daily as needed. 10. Spironolactone 100 mg p.o. once daily. 11. Bacitracin polymyxin 500/10,000 units topical ointment 3 times daily to affected areas. 12. Furosemide 10 mg p.o. twice daily.  13. Potassium chloride 20 mEq p.o. once daily.  14. Magnesium oxide 400 mg p.o. twice daily. 15. Rifaximin 550 mg p.o. twice daily.  16. Pantoprazole 40 mg p.o. once daily.  17. Levofloxacin 500 mg p.o. once daily for 2 more days.  18. Folic acid 1 mg p.o. once daily. 19. Thiamine 100 mg p.o. once daily. 20. Ensure Plus 237 mL 3 times daily with meals.  OXYGEN: None.   DIET: 2 grams salt, 60 grams protein restriction. Also, the patient is to use Ensure 3 times a day. The diet will be mechanical soft.   ACTIVITY LIMITATIONS: As tolerated.   FOLLOWUP APPOINTMENT: With Dr. Vira Agar in 1 week after discharge. The patient is referred to physical therapy 2 to 4 times a week.  CONSULTANTS: Care management, Dr. Vira Agar, social work, Ms. Denice Paradise, nurse practitioner for Dr. Vira Agar.   RADIOLOGIC STUDIES: Abdominal 3-way, including PA of chest x-ray on 09/22/2012 showed nonspecific bowel gas pattern. Correlate for ileus or possibly partial obstruction. Hyperinflation of the lungs was also noted. Ultrasound-guided paracentesis on 09/23/2012. Real-time ultrasound of abdomen was performed. There is moderate amount of abdominal free fluid noted. The amount of fluid was adequate for performing paracentesis. The patient's INR was elevated at 1.7. Recommend correction of the patient's INR prior to paracentesis. Paracentesis was not performed. Ultrasound-guided paracentesis on 09/26/2012 showed ascites. No safe pockets for fluid aspiration identified. Repeat exam can be obtained when ascites progresses, according to radiologist.   HISTORY OF PRESENT ILLNESS: The patient is a 69 year old Caucasian male with history of alcohol abuse, history of hepatic liver cirrhosis with ascites, who presented to the hospital with complaints of worsening abdominal distention and pain, also shortness of breath due to abdominal distention. Please refer to Dr. Nichola Sizer admission note on 09/22/2012. On arrival to the Emergency Room, the patient's, temperature was 98.4, pulse was 78, respiratory rate was 20, blood pressure 105/70, pulse oximetry was 95% on room air. Physical exam revealed a distended abdomen with good bowel sounds and fluid wave. The patient's lab data done in the Emergency Room showed elevated beta natriuretic peptide of 411. Otherwise, BMP was unremarkable. The patient's calcium level was low at 8.4. Magnesium 1.8. Lipase 162. Alcohol level was less than 3. Liver enzymes revealed  albumin level of 1.8, alkaline phosphatase 154, AST was 74. The patient's troponin was less than 0.02. TSH was elevated at 9.54 on 09/23/2012. CBC showed normal white blood cell count of 7.6, hemoglobin 11.9, platelet count 181 with MCV of 105. Coagulation panel initially on  09/22/2012: The patient's pro time was 17.5 and INR was 1.4; however, repeated on 09/23/2012, showed pro time elevation to 19.6 and INR of 1.7. Urinalysis revealed 11 white blood cells, 5 red blood cells, 1+ leukocyte esterase, 1+ bilirubin, trace ketones, 30 mg/dL of protein. Mucus was present, but no bacterial or epithelial cells were noted. EKG showed normal sinus rhythm at 76 beats per minute, normal axis, no acute ST-T changes were noted; however, QRS voltage was decreased.   HOSPITAL COURSE: The patient was admitted to the hospital for further evaluation. He was started on spironolactone and Lasix, and with this, the patient's condition somewhat improved. His shortness of breath improved as well. We attempted paracentesis on 09/23/2012. At that time, the patient's INR was too high, and the radiologist was not able to perform that. However, fluid amount found in peritoneal cavity was high. We attempted one more time paracentesis on 09/26/2012. By that time, the patient's fluid level was little, and even though pro time was normal, paracentesis could not be safely performed due to very little amount of fluid. So, it was felt that the patient would benefit from medical management only with spironolactone as well as Lasix. The patient was followed by Dr. Vira Agar, and the patient is to follow up with Dr. Vira Agar as outpatient as well.   In regards to coagulopathy, as mentioned above, the patient's pro time was elevated. We initiated the patient on vitamin K to prepare him for paracentesis, and we transfused him with 1 unit of fresh frozen plasma on 09/26/2012 for correction of his coagulopathy, after which the patient's coagulopathy has improved somewhat. We checked the patient's pro time level on 09/26/2012 prior to attempted paracentesis. At that time, the patient's pro time level was 17.6 with INR of 1.4.  In regards to hepatic encephalopathy, will continue the patient on lactulose as well as Xifaxan;  however, the patient refused intermittently lactulose due to diarrhea which is usually very uncomfortable for the patient. We feel that the patient should continue on lactulose; however, even if he refuses this medication, he safely can be continued on Xifaxan alone.  In regards to tobacco and alcohol abuse, it appeared that in fact he still drinks alcohol at home. Now, since he is in [facility], his alcohol consumption of course is none, but discussion with the patient's sister yielded information that he still abused alcohol quite significantly while at home.   He was noted to have some skin erosions after falls. We should continue this patient on bacitracin and polymyxin topical ointment; however, no infection signs were noted upon discharge.   The patient is being discharged in stable condition with the above-mentioned medications and followup. His temperature is 98.4, pulse is 87 to 93, respiratory rate was 18, blood pressure 112/65, O2 saturations were 96% on room air at rest.   TIME SPENT: 40 minutes.   ____________________________ Theodoro Grist, MD rv:OSi D: 09/27/2012 13:55:15 ET T: 09/27/2012 14:22:32 ET JOB#: [id number]  cc: Theodoro Grist, MD, <Dictator> Manya Silvas, MD [place] MD ELECTRONICALLY SIGNED 10/05/2012 12:13

## 2014-06-08 NOTE — Consult Note (Signed)
CC: cirrhosis from alcoholism.  Pt case discussed with Dr. Farris Has.  Continued ascites, leg edema, low albumin, possible SBP on oral Levaquin due to iv came out and he refused another one.  Pt asleep and will see him tomorrow.  Electronic Signatures: Manya Silvas (MD)  (Signed on 09-Aug-14 12:20)  Authored  Last Updated: 09-Aug-14 12:20 by Manya Silvas (MD)

## 2014-06-08 NOTE — Discharge Summary (Signed)
PATIENT NAME:  [name], [name] MR#:  [id number] DATE OF BIRTH:  19-Feb-1945  DATE OF ADMISSION:  08/29/2012 DATE OF DISCHARGE:  09/07/2012  REASON FOR ADMISSION: Alcohol withdrawal. The patient presented to ER saying, "I want to get help for alcohol."    DISCHARGE DIAGNOSES:  1.  Alcohol withdrawal.  2.  Delirium tremens.  3.  Ascites.  4.  Coagulopathy.  5.  Hyponatremia.  6.  Hypomagnesemia.  7.  Urinary retention.  8.  Liver cirrhosis.  9.  Ascites.  10.  Thrombocytopenia.  11.  Anemia.  12.  Metabolic acidosis due to alcohol and exacerbation.   DISPOSITION: To skilled nursing facility.   DISCHARGE MEDICATIONS: Tylenol p.r.n. pain, Ativan 0.5 mg every 6 hours as needed for anxiety, agitation or detox, benzonatate 100 mg every 4 hours needed for cough, nadolol 20 mg 2 times a day, lactulose 20 grams 2 times a day, magnesium oxide 400 mg twice daily, Protonix 40 mg once a day, nicotine 21 mg dermal patch, Robitussin-DM for cough and multivitamins once daily.   DISPOSITION: Skilled nursing facility. Follow up with attending physician at the facility. Referral to see GI outpatient for management of cirrhosis and portal hypertension.   HOSPITAL COURSE: This is a 69 year old gentleman with history of severe alcoholism, no other previous medical history. HE IS ALLERGIC TO PENICILLIN. Comes on 07/14 asking for help trying to stop drinking. He drinks a 12-pack of beer a day and a few bottles of wine per day and has frequent falls, especially when he is not drinking. The patient was found to be very acidotic with a CO2 of 18 and a sodium of 126, anion gap of 17. The patient was admitted after evaluation and also because he had severe urinary retention.   He has a history of occasional cocaine and crack use and he lives alone. At this moment, he is not able to go back to live by himself, as he cannot take care of himself. He still is very weak and has multiple risks of fall, for which he is going to  be to a skilled nursing facility.  As for problems:  1.  Metabolic acidosis that was secondary to alcohol. IV fluids were given and it resolved with hydration.  2.  Hyponatremia was also due to alcohol, beer potomania and on top of that dehydration, for which the patient got better after he stopped drinking and IV fluids were given.  3.  Cirrhosis with thrombocytopenia and portal hypertension. The patient was started on nadolol.  4.  Urinary retention. Foley was removed. Flomax was started.  5.  Tobacco abuse. The patient has been counseled. 6.  Hypokalemia, likely due to alcohol abuse.   The patient actually did very well. We kept him several days because of withdrawal. After his withdrawal from the alcohol with CIWA protocol, we decided to do a paracentesis. His INR was really high, close to 2, for which we had to give him vitamin K. Vitamin K did not work, for which we had to give him 2 units of FFP. I did a bedside paracentesis, removing only 200 mL of fluid, as the patient did not have that much significant ascites. The patient is actually feeling pretty good and he has no other medical problems to account in this hospitalization.   His sodium is back to normal at 135. His hemoglobin is 11.5. At discharge, his platelets were 106 without any signs of bleeding. His INR is 1.7. His fluid urinalysis showed 400  nucleated cell count with 5% neutrophils, 50% lymphocytes.  not meeting criteria for SBP as less than 5 % ar PMN.  pt discharged in good condition. time spend 40 min    ____________________________ [place] Sink, MD rsg:jm D: 09/07/2012 13:44:00 ET T: 09/07/2012 16:32:49 ET JOB#: [id number]  cc: Bridger Sink, MD, <Dictator> Osker Ayoub America Brown MD ELECTRONICALLY SIGNED 09/09/2012 20:51

## 2014-06-08 NOTE — Consult Note (Signed)
CC: alcohol cirrhosis.  Has signif abd fluid on my exam, 2-3 + pitting edema of legs.  Can go up on aldactone, will start lasix low but increase soon.  Oral is well absorbed.  Concern about possible SBP and since may be few days til radiology is able to tap we will cover for possible SBP which is increased in cirrhotics with low albumin.  Prognosis poor.  Electronic Signatures: Manya Silvas (MD)  (Signed on 08-Aug-14 20:49)  Authored  Last Updated: 08-Aug-14 20:49 by Manya Silvas (MD)

## 2014-06-08 NOTE — Consult Note (Signed)
Pt CC abd pain and swelling, ascites, leg edema.  He is more alert and easier to understand.  Abd a little less swollen, legs still with signif edema.  urine sodium noted.  He has agreed to have an iv again and Dr. Clayton Bibles will give him FFP and try to get a tap from radiology. No new suggestions.  Electronic Signatures: Manya Silvas (MD)  (Signed on 10-Aug-14 11:49)  Authored  Last Updated: 10-Aug-14 11:49 by Manya Silvas (MD)

## 2014-06-08 NOTE — Consult Note (Signed)
Brief Consult Note: Diagnosis: Cirrhosis secondary to alcoholism.   Patient was seen by consultant.   Consult note dictated.   Orders entered.   Comments: Begin emperic treatment for SBP as patient had complained of mild non -specific abdominal discomfort in setting of ascites. SBP is catastrophic in end stage liver disease if it occurs. He is allergic to PCN so Rocephin is not used. Levaquin ordered. For  tense ascites and gross peripheral edema begin diuretics. Patient is small frame, low weight male with BP 97/61, therefore  will begin with lower doses of furosemide and aldactone and monitor BP, met b and urine sodium closely for response. Recommend diuretics now since paracentesis cannot be performed per radiology note becasue of INR1.7. Try Vit K SQ-but it may not improve the INR because of the severity  of his liver disease. Continue ppi. This cse was d/w Dr. Vira Agar in collaboration of care.  Electronic Signatures: Gershon Mussel (NP)  (Signed 08-Aug-14 13:43)  Authored: Brief Consult Note   Last Updated: 08-Aug-14 13:43 by Gershon Mussel (NP)

## 2014-06-08 NOTE — Consult Note (Signed)
CC: ascites and edema.  Pt weight down from 71 to 69.8 kg, his legs are less edematous, abd a little less distended.  He has skin bruises and abrasions and does not want to keep duoderm on so will apply polysporin.  Consider psy consult, pt refusing a lot of routine care measures.  Nurse aid said he slept better with the melatonin.  Needs magnesium supplement as level is 1.4,  I will order that for you. Check ammonia level in morning.  No hand flap today, responds better to questions.  I doubt radiology will be able to tap him, finish Levaquin for 7 days treatment.  Electronic Signatures: Manya Silvas (MD)  (Signed on 12-Aug-14 06:55)  Authored  Last Updated: 12-Aug-14 06:55 by Manya Silvas (MD)

## 2014-06-08 NOTE — Consult Note (Signed)
CC: pt [place] did not show a safe place to tap.  He has a little less fluid in legs, abd about same.  He requests something for sleep. Will try melatonin which is very safe in cirrhotics.  Will bump his lasix also.  check daily weights. finish levaquin 7 days of treatment since can't tap.  Electronic Signatures: Manya Silvas (MD)  (Signed on 11-Aug-14 19:27)  Authored  Last Updated: 11-Aug-14 19:27 by Manya Silvas (MD)

## 2014-06-10 NOTE — Discharge Summary (Signed)
PATIENT NAME:  [name], [name] MR#:  [id number] DATE OF BIRTH:  [birth year]  DATE OF ADMISSION:  03/29/2011 DATE OF DISCHARGE:  04/07/2011  HOSPITAL COURSE: See dictated History and Physical for details of admission. 69 year old man with a long history of alcohol dependence was admitted to the hospital for detoxification and consideration of substance abuse treatment. The patient was very unsteady and confused for several days. Underwent detox protocol. Required quite a bit of medications to control vital signs and shakiness.  He became slightly delirious at one point. Eventually he was able to come through the detox and regain lucidity. At that point, he did cooperate in some substance abuse treatment on the unit and showed some insight.  He was not complaining of depression or psychotic symptoms. He was offered the opportunity to go to the alcohol and drug abuse treatment center in [place]. He was counseled about the effects of continued drinking including death, seizures, DTs, etc. The patient understood all this and declined going for inpatient substance abuse treatment. He did request starting Antabuse. He had taken it before and was well aware of the risks of Antabuse including the risk of seizures or death if he drinks on it. He was started on 250 mg of Antabuse a day and took this for several days prior to discharge. The patient went back to stay at the home of his late mother where he had been staying previously. Followup was to be arranged with [facility] in the community.   LABORATORY RESULTS: CBC showed a low red blood cell count at 4.39, only slightly low. Chemistry panel showed a low potassium? at 132, elevated bilirubin at 1.3, elevated ALT at 195, elevated AST at 292. Alcohol level was 54 at admission. TSH elevated at 4.84. Urinalysis had some protein, did not appear to probably be infected. Drug screen positive for cocaine and cannabis.   DISCHARGE MEDICATIONS: Disulfiram 250 mg 1 p.o. daily.    MENTAL STATUS EXAM AT DISCHARGE: Elderly gentleman who looks his stated age or older. Good eye contact. Psychomotor activity shows a tremor but otherwise unremarkable. Affect is euthymic and reactive. Mood stated as being good. Thoughts are lucid with no evidence of loosening of associations. Alert and oriented. He shows reasonable understanding about his substance abuse and the risks he is running. Denies hallucinations. Denies suicidal or homicidal ideation.   DISPOSITION: As mentioned above, discharged home to the place he has been staying with followup through substance abuse services in the community.   DIAGNOSIS PRINCIPLE AND PRIMARY:  AXIS I: Alcohol dependence.   SECONDARY DIAGNOSES:  AXIS I: Cocaine and marijuana abuse versus dependence.   AXIS II: Deferred.   AXIS III: Status post alcohol withdrawal. Had an elevated TSH but was not thought to require treatment at this point.   AXIS IV: Severe. Chronic stress from homelessness partially, lack of work, lack of the general social support, ill health.   AXIS V: Functioning at time of discharge: 21.    ____________________________ Gonzella Lex, MD jtc:bjt D: 04/16/2011 16:14:13 ET T: 04/17/2011 11:53:28 ET JOB#: [id number]  cc: Gonzella Lex, MD, <Dictator> Gonzella Lex MD ELECTRONICALLY SIGNED 04/17/2011 13:54

## 2014-06-10 NOTE — H&P (Signed)
PATIENT NAME:  [name], Alder MR#:  [id number] DATE OF BIRTH:  04-03-1945  DATE OF ADMISSION:  03/28/2011  INITIAL ASSESSMENT AND PSYCHIATRIC EVALUATION   IDENTIFYING INFORMATION: The patient is a 69 year old white male who has long history of alcohol dependence and cocaine abuse and is brought back for admission to [facility] and was dropped off at the Emergency Room by his sister for the same. According to information obtained from the chart and ED notes, the patient has been sweating and has been restless and has been having rapid pulse and was nauseated and confused. The chief complaint was desire to get detox from alcohol. Most of the history was obtained from the Emergency Room notes and from the staff and from the chart as the patient was drowsy with CIWA protocol and medications and could not give history and is a poor historian.   HISTORY OF PRESENT ILLNESS:  The patient has long history of alcohol dependence and had been through detox programs in the past. Currently, he had been drinking alcohol probably as much as he could get. In addition, according to information obtained from the chart, the patient has been abusing drugs and there was past history of cocaine abuse. He has been sober in the past after he went through treatment at [facility] many years ago.   PAST PSYCHIATRIC HISTORY: There were three prior detox admissions to [facility] and was also admitted to [facility]. He has been sober for a year after being through [facility]. He has been sober for some time after he had been through detox at [facility] in August of 2011, but started drinking alcohol and started getting sick. The patient was discharged on Naltrexone 50 milligrams per day when he was discharged from [facility] in August of 2011. It is doubtful how long he took this medication.   FAMILY HISTORY OF MENTAL  ILLNESS: None known for mental illness in the family. No known history of suicides in the family.    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 No further history is available at this time.   PAST MEDICAL HISTORY:   History of alcoholic hepatitis. No known history of drug allergies. Not being followed by any physician at this time. No known history of high blood pressure. No known diabetes mellitus. No major surgeries. No major injuries or alcoholic hepatitis according to the chart.   ALCOHOL AND DRUGS: Long history of alcohol drinking. Had DWIs. History of cocaine abuse.   PHYSICAL EXAMINATION:   VITAL SIGNS: Temperature 98.2, pulse 86 per minute, regular, respirations 20 per minute, regular, blood pressure 105/60.   HEENT: According to information obtained from the chart and Emergency Room notes, normocephalic and atraumatic. EYES: Pupils are equal, round and reactive to light and accommodation. Fundi bilaterally benign.    NECK: Supple without any organomegaly, lymphadenopathy or thyromegaly. Extraocular movements visualized. Tympanic membranes visualized. No exudates.   CHEST: Normal expansion. Normal breath sounds.   HEART: Normal S1, S2 without any murmurs or gallops.   ABDOMEN: Soft. No organomegaly. Bowel sounds heard.   RECTAL: Deferred.   NEUROLOGIC: Gait is not tested. Romberg is not tested. Cranial nerves 2-12 are grossly intact. Deep tendon reflexes 2+, normal plantars, normal response.   MENTAL STATUS EXAMINATION: The patient is seen lying down  in bed. He is arousable, though he is very drowsy. He knew his name and person, place and situation. He was pleasant and he wants to get help. Grooming is rather poor and disheveled. Speech is very low and soft. Appears very tired. Mood is low and down about his drinking. Thought processes are logical and goal directed.  Denies any auditory or visual hallucinations. Denies any ideas to hurt himself or others and wants to get help. Further mental status could not be done. Insight and judgment are very limited and guarded.   IMPRESSION: AXIS I:  1. Alcohol dependence, chronic, continuous. 2. Cocaine abuse.   AXIS II: Deferred.   AXIS III: Alcoholic hepatitis, history of.   AXIS IV: Severe, substance abuse, problems with primary support, no insight into his alcohol-related problems.   AXIS V: Global Assessment of Functioning 25.   PLAN: The patient is admitted to [facility] for close observation, evaluation and help. He will be started on CIWA program. During his stay in the hospital, he will be given milieu therapy and supportive counseling and he will be encouraged to take part in substance abuse counseling as soon as he is ready to do so. Social Services will look into appropriate alcohol-related programs that will suit the convenience of the patient and appropriate follow-up appointments will be made at the time of discharge.    ____________________________ Wallace Cullens. Franchot Mimes, MD skc:ap D: 03/29/2011 20:25:57 ET T: 03/30/2011 08:04:05 ET JOB#: [id number]  cc: Arlyn Leak K. Franchot Mimes, MD, <Dictator> Dewain Penning MD ELECTRONICALLY SIGNED 04/04/2011 15:24
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
## 2015-08-24 ENCOUNTER — Emergency Department
Admission: EM | Admit: 2015-08-24 | Discharge: 2015-08-24 | Disposition: A | Discharge status: Home / Self Care | Payer: Medicare Other | Attending: Emergency Medicine | Admitting: Emergency Medicine

## 2015-08-24 ENCOUNTER — Encounter: Payer: Self-pay | Admitting: Emergency Medicine

## 2015-08-24 DIAGNOSIS — R531 Weakness: Secondary | ICD-10-CM | POA: Insufficient documentation

## 2015-08-24 DIAGNOSIS — Z5321 Procedure and treatment not carried out due to patient leaving prior to being seen by health care provider: Secondary | ICD-10-CM | POA: Insufficient documentation

## 2015-08-24 NOTE — ED Notes (Signed)
Pt refusing care and lab work.  Pt states he just wants to go home.   [place] for pt and they state they will come and pick up pt.

## 2015-08-24 NOTE — ED Notes (Signed)
Spoke with pt sister who states she is on her way to come and pick up pt.

## 2015-08-24 NOTE — ED Notes (Signed)
Called and spoke with worker at [facility].  She states she has tried to contact pt sister and has contacted the owner of the care home but has not been able to get anyone to come to get pt.  I asked for owners number but she refused and states she will call him again.

## 2015-08-24 NOTE — ED Notes (Signed)
Patient states was out walking and felt weak, states lives in a group home. Denies pain.

## 2015-10-21 ENCOUNTER — Encounter: Payer: Self-pay | Admitting: Emergency Medicine

## 2015-10-21 ENCOUNTER — Emergency Department
Admission: EM | Admit: 2015-10-21 | Discharge: 2015-10-25 | Disposition: A | Discharge status: Other Acute Inpatient Hospital | Payer: Medicare Other | Attending: Emergency Medicine | Admitting: Emergency Medicine

## 2015-10-21 ENCOUNTER — Emergency Department: Payer: Medicare Other

## 2015-10-21 DIAGNOSIS — F17210 Nicotine dependence, cigarettes, uncomplicated: Secondary | ICD-10-CM | POA: Insufficient documentation

## 2015-10-21 DIAGNOSIS — R4689 Other symptoms and signs involving appearance and behavior: Secondary | ICD-10-CM | POA: Diagnosis present

## 2015-10-21 DIAGNOSIS — F03918 Unspecified dementia, unspecified severity, with other behavioral disturbance: Secondary | ICD-10-CM

## 2015-10-21 DIAGNOSIS — F0391 Unspecified dementia with behavioral disturbance: Secondary | ICD-10-CM

## 2015-10-21 HISTORY — DX: Unspecified cirrhosis of liver: K74.60

## 2015-10-21 HISTORY — DX: Alcohol abuse, uncomplicated: F10.10

## 2015-10-21 LAB — URINALYSIS COMPLETE WITH MICROSCOPIC (ARMC ONLY)
Bacteria, UA: NONE SEEN
Bilirubin Urine: NEGATIVE
GLUCOSE, UA: NEGATIVE mg/dL
Hgb urine dipstick: NEGATIVE
KETONES UR: NEGATIVE mg/dL
LEUKOCYTES UA: NEGATIVE
NITRITE: NEGATIVE
Protein, ur: NEGATIVE mg/dL
RBC / HPF: NONE SEEN RBC/hpf (ref 0–5)
SPECIFIC GRAVITY, URINE: 1.01 (ref 1.005–1.030)
Squamous Epithelial / LPF: NONE SEEN
pH: 5 (ref 5.0–8.0)

## 2015-10-21 LAB — COMPREHENSIVE METABOLIC PANEL
ALT: 39 U/L (ref 17–63)
AST: 68 U/L — ABNORMAL HIGH (ref 15–41)
Albumin: 3.1 g/dL — ABNORMAL LOW (ref 3.5–5.0)
Alkaline Phosphatase: 72 U/L (ref 38–126)
Anion gap: 5 (ref 5–15)
BUN: 6 mg/dL (ref 6–20)
CHLORIDE: 107 mmol/L (ref 101–111)
CO2: 27 mmol/L (ref 22–32)
CREATININE: 0.63 mg/dL (ref 0.61–1.24)
Calcium: 9 mg/dL (ref 8.9–10.3)
Glucose, Bld: 83 mg/dL (ref 65–99)
Potassium: 3.8 mmol/L (ref 3.5–5.1)
Sodium: 139 mmol/L (ref 135–145)
Total Bilirubin: 0.6 mg/dL (ref 0.3–1.2)
Total Protein: 6.6 g/dL (ref 6.5–8.1)

## 2015-10-21 LAB — CBC WITH DIFFERENTIAL/PLATELET
Basophils Absolute: 0.1 10*3/uL (ref 0–0.1)
Basophils Relative: 1 %
EOS PCT: 3 %
Eosinophils Absolute: 0.2 10*3/uL (ref 0–0.7)
HCT: 34.8 % — ABNORMAL LOW (ref 40.0–52.0)
Hemoglobin: 12.2 g/dL — ABNORMAL LOW (ref 13.0–18.0)
LYMPHS ABS: 1.7 10*3/uL (ref 1.0–3.6)
LYMPHS PCT: 28 %
MCH: 33.8 pg (ref 26.0–34.0)
MCHC: 35 g/dL (ref 32.0–36.0)
MCV: 96.7 fL (ref 80.0–100.0)
MONO ABS: 0.9 10*3/uL (ref 0.2–1.0)
MONOS PCT: 14 %
Neutro Abs: 3.3 10*3/uL (ref 1.4–6.5)
Neutrophils Relative %: 54 %
PLATELETS: 215 10*3/uL (ref 150–440)
RBC: 3.6 MIL/uL — AB (ref 4.40–5.90)
RDW: 15 % — AB (ref 11.5–14.5)
WBC: 6.2 10*3/uL (ref 3.8–10.6)

## 2015-10-21 LAB — ETHANOL

## 2015-10-21 LAB — TROPONIN I

## 2015-10-21 MED ORDER — RISPERIDONE 0.5 MG PO TBDP
0.2500 mg | ORAL_TABLET | Freq: Every day | ORAL | Status: DC
  Filled 2015-10-21: qty 0.5

## 2015-10-21 NOTE — ED Notes (Signed)
BEHAVIORAL HEALTH ROUNDING Patient sleeping: Yes.   Patient alert and oriented: yes Behavior appropriate: Yes.  ; If no, describe:  Nutrition and fluids offered: Yes  Toileting and hygiene offered: Yes  Sitter present: not applicable Law enforcement present: Yes  

## 2015-10-21 NOTE — ED Notes (Signed)
Pt. Alert and oriented, warm and dry, in no distress. Pt. Denies SI, HI, and AVH. Pt. Encouraged to let nursing staff know of any concerns or needs. 

## 2015-10-21 NOTE — BH Assessment (Signed)
Writer called and left a HIPPA Compliant message with sister (Patrica-[phone]), requesting a return phone call. Writer seeking collateral information.

## 2015-10-21 NOTE — BH Assessment (Signed)
Writer called and left a HIPPA Compliant message with Group Home [phone]), requesting a return phone call. Wanted to update them of patient's disposition.  Writer spoke with the patient's sister ([phone]) and informed her of the disposition.   Informed them of the patient's disposition. Informed her of the process of a patient being placed at another facility. Informed her the patient will remain in the ER until placed with a [facility] or if he clears up and return to baseline while in the ER he may discharged back to the [place].

## 2015-10-21 NOTE — ED Notes (Signed)

## 2015-10-21 NOTE — ED Notes (Signed)
Pt continues to come out of his room naked even though he has been reminded several times to put on some clothes or even just underwear,ate well for dinner

## 2015-10-21 NOTE — Consult Note (Signed)
[name]   Reason for Consult:  Consult for 70 year old man with a history of alcohol abuse and alcohol related dementia brought in with worsening behavior problems at his living facility Referring Physician:  Clearnce Hasten Patient Identification: [name] MRN:  [id number] Principal Diagnosis: Dementia with behavioral disturbance Diagnosis:   Patient Active Problem List   Diagnosis Date Noted  . Dementia with behavioral disturbance [F03.91] 10/21/2015    Total Time spent with patient: 45 minutes  Subjective:   [name] is a 70 y.o. male patient admitted with "I just like to get out".  HPI:  Patient interviewed. Chart reviewed. 70 year old man with a history of alcohol abuse and alcohol related dementia. Had recently been living at a group home or assisted-living kind of facility. Has been getting more agitated wandering away and now at one point found lying down out in the middle of the street. Patient not able to give any coherent explanation for this makes sense. Denies any suicidal thoughts. Denies having any hallucinations. He appears confused although somewhat jolly and pleasant.  Medical history: Long-standing alcohol abuse problem. No other known active ongoing medical problems right now.  Social history: Daughter appears to be closest relative. Not sure if anyone has been appointed guardian or power of attorney. Recently had been living in an assisted living facility or group home of some sort.  Substance abuse history: Long-standing documented history of alcohol abuse with behavior problems alcohol withdrawal but no clear delirium tremens. Has been long resistant to engaging in any kind of actual substance abuse treatment.  Past Psychiatric History: No history of psychiatric problems other than alcohol abuse  Risk to Self: Suicidal Ideation: No Suicidal Intent: No Is patient at risk for suicide?: No Suicidal Plan?: No Access to Means: No What  has been your use of drugs/alcohol within the last 12 months?: Alcohol and THC How many times?: 0 Other Self Harm Risks: Reports of none Triggers for Past Attempts: None known Intentional Self Injurious Behavior: None Risk to Others: Homicidal Ideation: No Thoughts of Harm to Others: No Current Homicidal Intent: No Current Homicidal Plan: No Access to Homicidal Means: No Identified Victim: Reports of none History of harm to others?: No Assessment of Violence: None Noted Violent Behavior Description: Reports of none Does patient have access to weapons?: No Criminal Charges Pending?: No Does patient have a court date: No Prior Inpatient Therapy: Prior Inpatient Therapy: Yes Prior Therapy Dates: 1968 Prior Therapy Facilty/Provider(s): Patient unable to remember Reason for Treatment: Alcohol Prior Outpatient Therapy: Prior Outpatient Therapy: No Prior Therapy Dates: Reports of none Prior Therapy Facilty/Provider(s): Reports of none Reason for Treatment: Reports of none Does patient have an ACCT team?: No Does patient have Intensive In-House Services?  : No Does patient have [facility] services? : No Does patient have P4CC services?: No  Past Medical History:  Past Medical History:  Diagnosis Date  . Alcohol abuse   . Cirrhosis ([place])    History reviewed. No pertinent surgical history. Family History: No family history on file. Family Psychiatric  History: No known family history and the patient is not aware of any. Social History:  History  Alcohol Use No     History  Drug use: Unknown    Social History   Social History  . Marital status: Divorced    Spouse name: N/A  . Number of children: N/A  . Years of education: N/A   Social History Main Topics  . Smoking status: Current Every Day Smoker  Packs/day: 0.50    Types: Cigarettes  . Smokeless tobacco: Never Used  . Alcohol use No  . Drug use: Unknown  . Sexual activity: Not Asked   Other Topics Concern  . None    Social History Narrative  . None   Additional Social History:    Allergies:   Allergies  Allergen Reactions  . Penicillins Other (See Comments)    unknown    Labs:  Results for orders placed or performed during the hospital encounter of 10/21/15 (from the past 48 hour(s))  Urinalysis complete, with microscopic ([facility] only)     Status: Abnormal   Collection Time: 10/21/15 11:58 AM  Result Value Ref Range   Color, Urine YELLOW (A) YELLOW   APPearance CLEAR (A) CLEAR   Glucose, UA NEGATIVE NEGATIVE mg/dL   Bilirubin Urine NEGATIVE NEGATIVE   Ketones, ur NEGATIVE NEGATIVE mg/dL   Specific Gravity, Urine 1.010 1.005 - 1.030   Hgb urine dipstick NEGATIVE NEGATIVE   pH 5.0 5.0 - 8.0   Protein, ur NEGATIVE NEGATIVE mg/dL   Nitrite NEGATIVE NEGATIVE   Leukocytes, UA NEGATIVE NEGATIVE   RBC / HPF NONE SEEN 0 - 5 RBC/hpf   WBC, UA 0-5 0 - 5 WBC/hpf   Bacteria, UA NONE SEEN NONE SEEN   Squamous Epithelial / LPF NONE SEEN NONE SEEN   Mucous PRESENT   CBC with Differential     Status: Abnormal   Collection Time: 10/21/15 11:58 AM  Result Value Ref Range   WBC 6.2 3.8 - 10.6 K/uL   RBC 3.60 (L) 4.40 - 5.90 MIL/uL   Hemoglobin 12.2 (L) 13.0 - 18.0 g/dL   HCT 34.8 (L) 40.0 - 52.0 %   MCV 96.7 80.0 - 100.0 fL   MCH 33.8 26.0 - 34.0 pg   MCHC 35.0 32.0 - 36.0 g/dL   RDW 15.0 (H) 11.5 - 14.5 %   Platelets 215 150 - 440 K/uL   Neutrophils Relative % 54 %   Neutro Abs 3.3 1.4 - 6.5 K/uL   Lymphocytes Relative 28 %   Lymphs Abs 1.7 1.0 - 3.6 K/uL   Monocytes Relative 14 %   Monocytes Absolute 0.9 0.2 - 1.0 K/uL   Eosinophils Relative 3 %   Eosinophils Absolute 0.2 0 - 0.7 K/uL   Basophils Relative 1 %   Basophils Absolute 0.1 0 - 0.1 K/uL  Comprehensive metabolic panel     Status: Abnormal   Collection Time: 10/21/15 11:58 AM  Result Value Ref Range   Sodium 139 135 - 145 mmol/L   Potassium 3.8 3.5 - 5.1 mmol/L   Chloride 107 101 - 111 mmol/L   CO2 27 22 - 32 mmol/L    Glucose, Bld 83 65 - 99 mg/dL   BUN 6 6 - 20 mg/dL   Creatinine, Ser 0.63 0.61 - 1.24 mg/dL   Calcium 9.0 8.9 - 10.3 mg/dL   Total Protein 6.6 6.5 - 8.1 g/dL   Albumin 3.1 (L) 3.5 - 5.0 g/dL   AST 68 (H) 15 - 41 U/L   ALT 39 17 - 63 U/L   Alkaline Phosphatase 72 38 - 126 U/L   Total Bilirubin 0.6 0.3 - 1.2 mg/dL   GFR calc non Af Amer >60 >60 mL/min   GFR calc Af Amer >60 >60 mL/min    Comment: (NOTE) The eGFR has been calculated using the CKD EPI equation. This calculation has not been validated in all clinical situations. eGFR's persistently <60 mL/min signify  possible Chronic Kidney Disease.    Anion gap 5 5 - 15  Ethanol     Status: None   Collection Time: 10/21/15 11:58 AM  Result Value Ref Range   Alcohol, Ethyl (B) <5 <5 mg/dL    Comment:        LOWEST DETECTABLE LIMIT FOR SERUM ALCOHOL IS 5 mg/dL FOR MEDICAL PURPOSES ONLY     Current Facility-Administered Medications  Medication Dose Route Frequency Provider Last Rate Last Dose  . risperiDONE (RISPERDAL M-TABS) disintegrating tablet 0.25 mg  0.25 mg Oral QHS Gonzella Lex, MD       No current outpatient prescriptions on file.    Musculoskeletal: Strength & Muscle Tone: decreased Gait & Station: unsteady Patient leans: N/A  Psychiatric Specialty Exam: Physical Exam  Nursing note and vitals reviewed. Constitutional: He appears cachectic.  HENT:  Head: Normocephalic and atraumatic.  Eyes: Conjunctivae are normal. Pupils are equal, round, and reactive to light.  Neck: Normal range of motion.  Cardiovascular: Regular rhythm and normal heart sounds.   Respiratory: Effort normal. No respiratory distress.  GI: Soft.  Musculoskeletal: Normal range of motion.  Neurological: He is alert.  Skin: Skin is warm and dry.  Psychiatric: His affect is blunt. His speech is delayed. He is slowed. Cognition and memory are impaired. He expresses impulsivity. He expresses no suicidal ideation.    Review of Systems   Constitutional: Negative.   HENT: Negative.   Eyes: Negative.   Respiratory: Negative.   Cardiovascular: Negative.   Gastrointestinal: Negative.   Musculoskeletal: Negative.   Skin: Negative.   Neurological: Negative.   Psychiatric/Behavioral: Positive for memory loss. Negative for depression, hallucinations, substance abuse and suicidal ideas. The patient is not nervous/anxious and does not have insomnia.     Blood pressure 92/60, pulse 74, temperature 98.1 F (36.7 C), temperature source Oral, resp. rate 16, height 5' 10" (1.778 m), weight 58.1 kg (128 lb), SpO2 100 %.Body mass index is 18.37 kg/m.  General Appearance: Disheveled  Eye Contact:  Fair  Speech:  Garbled and Slow  Volume:  Decreased  Mood:  Euthymic  Affect:  Constricted  Thought Process:  Disorganized  Orientation:  Negative  Thought Content:  Illogical  Suicidal Thoughts:  No  Homicidal Thoughts:  No  Memory:  Immediate;   Fair Recent;   Poor Remote;   Fair  Judgement:  Impaired  Insight:  Lacking  Psychomotor Activity:  Decreased  Concentration:  Concentration: Poor  Recall:  Poor  Fund of Knowledge:  Poor  Language:  Poor  Akathisia:  No  Handed:  Right  AIMS (if indicated):     Assets:  Housing Social Support  ADL's:  Impaired  Cognition:  Impaired,  Mild  Sleep:        Treatment Plan Summary: Medication management and Plan 70 year old man with history of alcohol abuse and behavior problems. Asian is likely going to require eventual transfer to a locked unit but may need some control of behavior problems in the meantime. Recommend referral to geriatric psychiatry unit. IVC upheld. I had initially put in an order for a low dose of risperidone but will hold off on that given that there is now some concern about an abnormal EKG. TTS and ER physician aware.  Disposition: Recommend psychiatric Inpatient admission when medically cleared.  Alethia Berthold, MD 10/21/2015 5:04 PM

## 2015-10-21 NOTE — ED Notes (Signed)
New burgundy scrubs and socks given by this Probation officer.

## 2015-10-21 NOTE — ED Notes (Signed)
BEHAVIORAL HEALTH ROUNDING Patient sleeping: No. Patient alert and oriented: yes Behavior appropriate: Yes.  ; If no, describe:  Nutrition and fluids offered: Yes  Toileting and hygiene offered: Yes  Sitter present: not applicable Law enforcement present: Yes  

## 2015-10-21 NOTE — ED Provider Notes (Signed)
-----------------------------------------   5:06 PM on 10/21/2015 -----------------------------------------   Blood pressure 92/60, pulse 74, temperature 98.1 F (36.7 C), temperature source Oral, resp. rate 16, height '5\' 10"'$  (1.778 m), weight 128 lb (58.1 kg), SpO2 100 %.  The patient had no acute events since last update.  Calm and cooperative at this time.   Plan is to have the patient placed. EKG done for screening purposes.  ED ECG REPORT I, Doran Stabler, the attending physician, personally viewed and interpreted this ECG.   Date: 10/21/2015  EKG Time: 1655  Rate: 70  Rhythm: normal sinus rhythm  Axis: Normal  Intervals:mildly prolonged qtc at 468  ST&T Change: T wave inversions in aVL, V2 and V3 which are new from previous.  We'll check troponin to make sure there is no further indication for cardiac workup. Patient without any acute complaints at this time an EKG was done for screening purposes.     John Pyo, MD 10/21/15 [id number]

## 2015-10-21 NOTE — ED Triage Notes (Signed)
Brought in by [facility], IVCd by group home, reports pt has been wondering and trying to break in homes.  Pt calm and cooperative.

## 2015-10-21 NOTE — ED Provider Notes (Signed)
[facility] Emergency Department Provider Note ____________________________________________   I have reviewed the triage vital signs and the triage nursing note.  HISTORY  Chief Complaint Aggressive Behavior   Historian Level V, patient is a poor historian Some additional history obtained from IVC paperwork by sister who was the petitioner Additional history obtained from group home staff  HPI [name] is a 70 y.o. male with a history reportedly of prior drug abuse and alcohol abuse, but states now he is in a group home and his sister manages his money that he only drinks about 1 beer per month, is here under IVC from his sister the petitioner for odd behavior. In her IVC paperwork she notes that over the past month or so he has had increase in all behavior including wandering from the group home. After speaking with the group home staff, the patient has been found lifting the flags on stranger's mailboxes, taking down the Jesus signs in strangers yards, and also found lying a street. When I asked the patient about these incidents, he states that sometimes I do leave the group home without signing out. He deflects the question. He states he is not depressed. He denies suicidal ideation.  Per the group home, it sounds like things do seem more significantly different in the past several weeks.  They report no known prior psychiatric history, states that he was going to walk-in appointment at [facility] behavioral health last week, but they were not taking walk-ins.   Past Medical History:  Diagnosis Date  . Alcohol abuse   . Cirrhosis ([place])     There are no active problems to display for this patient.   History reviewed. No pertinent surgical history.  Prior to Admission medications   Not on File    Allergies  Allergen Reactions  . Penicillins Other (See Comments)    unknown    No family history on file.  Social History Social History  Substance  Use Topics  . Smoking status: Current Every Day Smoker    Packs/day: 0.50    Types: Cigarettes  . Smokeless tobacco: Never Used  . Alcohol use No    Review of Systems  Constitutional: Negative for fever. Eyes: Negative for visual changes. ENT: Negative for sore throat. Cardiovascular: Negative for chest pain. Respiratory: Negative for shortness of breath. Gastrointestinal: Negative for abdominal pain, vomiting and diarrhea. Genitourinary: Negative for dysuria. Musculoskeletal: Negative for back pain. Skin: Negative for rash. Neurological: Negative for headache. 10 point Review of Systems otherwise negative ____________________________________________   PHYSICAL EXAM:  VITAL SIGNS: ED Triage Vitals  Enc Vitals Group     BP 10/21/15 1153 129/62     Pulse Rate 10/21/15 1153 66     Resp 10/21/15 1153 18     Temp 10/21/15 1153 98 F (36.7 C)     Temp Source 10/21/15 1153 Oral     SpO2 10/21/15 1153 99 %     Weight 10/21/15 1153 128 lb (58.1 kg)     Height 10/21/15 1153 '5\' 10"'$  (1.778 m)     Head Circumference --      Peak Flow --      Pain Score 10/21/15 1154 0     Pain Loc --      Pain Edu? --      Excl. in [place]? --      Constitutional: Alert and oriented. Cooperative, well appearing and in no acute distress.  HEENT   Head: Normocephalic and atraumatic.  Eyes: Conjunctivae are normal. PERRL. Normal extraocular movements.      Ears:         Nose: No congestion/rhinnorhea.   Mouth/Throat: Mucous membranes are moist. Missing teeth.   Neck: No stridor. Cardiovascular/Chest: Normal rate, regular rhythm.  No murmurs, rubs, or gallops. Respiratory: Normal respiratory effort without tachypnea nor retractions. Breath sounds are clear and equal bilaterally. No wheezes/rales/rhonchi. Gastrointestinal: Soft. Nontender, mild distention in cirrhotic appearance. Genitourinary/rectal:Deferred Musculoskeletal: Nontender with normal range of motion in all extremities.  No joint effusions.  No lower extremity tenderness.  No edema. Neurologic: No slurred speech. No facial droop. No gross or focal neurologic deficits are appreciated. Skin:  Skin is warm, dry and intact. No rash noted. Psychiatric: Overall poor historian, but patient does not appear to be having acute psychosis, and certainly has no agitation or aggression now. Reports mild depressed mood due to boredom, but no suicidal ideation.  ____________________________________________   EKG I, Lisa Roca, MD, the attending physician have personally viewed and interpreted all ECGs.  None ____________________________________________  LABS (pertinent positives/negatives)  Labs Reviewed  URINALYSIS COMPLETEWITH MICROSCOPIC ([facility] ONLY) - Abnormal; Notable for the following:       Result Value   Color, Urine YELLOW (*)    APPearance CLEAR (*)    All other components within normal limits  CBC WITH DIFFERENTIAL/PLATELET - Abnormal; Notable for the following:    RBC 3.60 (*)    Hemoglobin 12.2 (*)    HCT 34.8 (*)    RDW 15.0 (*)    All other components within normal limits  COMPREHENSIVE METABOLIC PANEL - Abnormal; Notable for the following:    Albumin 3.1 (*)    AST 68 (*)    All other components within normal limits  ETHANOL    ____________________________________________  RADIOLOGY All Xrays were viewed by me. Imaging interpreted by Radiologist.  CT without contrast:  IMPRESSION: 1. Age related cerebral atrophy, ventriculomegaly and periventricular white matter disease. 2. Remote right temporal parietal infarct. 3. No acute intracranial findings.  __________________________________________  PROCEDURES  Procedure(s) performed: None  Critical Care performed: None  ____________________________________________   ED COURSE / ASSESSMENT AND PLAN  Pertinent labs & imaging results that were available during my care of the patient were reviewed by me and considered in my medical  decision making (see chart for details).   Mr. [name] is here by IVC per his sister out of concern for his safety. Although my initial evaluation the patient, he seems calm and without acute psychosis or suicidal ideation, after speaking with the group home staff, it sounds like he has had some bit of change in very odd behavior over the past several weeks and some dangerous behavior including lying in the street.  I will continue his IVC so he can see his psychiatrist and evaluation as well as TTS.  Due to the new behavioral change, without a known psychiatric history, I am going to CT image his head.  CT head without acute finding. Dispo per TTS and psychiatry.  Patient to be moved to [facility] for consults.    CONSULTATIONS:  TTS and psychiatry.    Patient / Family / Caregiver informed of clinical course, medical decision-making process, and agree with plan.     ___________________________________________   FINAL CLINICAL IMPRESSION(S) / ED DIAGNOSES   Final diagnoses:  None              Note: This dictation was prepared with Dragon dictation. Any transcriptional errors that result from  this process are unintentional    Lisa Roca, MD 10/21/15 1400

## 2015-10-21 NOTE — BH Assessment (Addendum)
Assessment Note  [name] is an 70 y.o. male who presents to the ER via law enforcement, due to his sister petitioning him to be under IVC. Per IVC, patient is having odd and bizarre behaviors. Per the sister, he is leaving the [place] and not returning on time. Several nights ago, he was seen by law enforcement trying to enter an abandoned house. He was taking back to the Group Home. Earlier on yesterday (10/20/2015), he left the [place] and law enforcement had to locate him and bring him back. When he was found, he was laying in the middle of the street. Sister reports, he stated he was tired and God told him to lay down and he will find him a way home. Sister states, she is concerned he is minimizing what is going on and he is going to get hurt while in the community. He often asked her about a million dollars family have left him. He also talked to her about people she doesn't know. She believes they are all "hallucinations" and he isn't completely forth coming about it.  Per the patient, he is unsure why he's in the ER. He denies SI/HI and AV/H. When he leaves the Group Home, he goes walking for exercise. He admits to drinking alcohol once a month. It's in the amount of an 8oz beer. He states he uses cannabis once a month as well.  Per Group Home, [facility] ([place]-[phone]), the patient is leaving the home and not returning in time. He is able to sign his self out and he is his own guardian. The home is concerned he is going to get hurt by someone in the community. He is also bringing alcohol back to the house but it's against the rules. Staff states, he is pleasant and polite. They have no problems with him at the home. They believe the patient isn't taking his medications and that is causing the changes.  He's been with the home since 01/24/2015. When stable, he is able to return.  Past Medical History:  Past Medical History:  Diagnosis Date  . Alcohol abuse   . Cirrhosis ([place])      History reviewed. No pertinent surgical history.  Family History: No family history on file.  Social History:  reports that he has been smoking Cigarettes.  He has been smoking about 0.50 packs per day. He has never used smokeless tobacco. He reports that he does not drink alcohol. His drug history is not on file.  Additional Social History:  Alcohol / Drug Use Pain Medications: See PTA Prescriptions: See PTA Over the Counter: See PTA History of alcohol / drug use?: Yes Longest period of sobriety (when/how long): Unable to Quantify Negative Consequences of Use:  (Reports of none) Withdrawal Symptoms:  (Reports of none) Substance #1 Name of Substance 1: Alcohol 1 - Age of First Use: Teenager 1 - Amount (size/oz): "A beer" 1 - Frequency: "About once a month." 1 - Duration: Unable to quantify  1 - Last Use / Amount: 09/2015  CIWA: CIWA-Ar BP: 92/60 (sleeping) Pulse Rate: 74 COWS:    Allergies:  Allergies  Allergen Reactions  . Penicillins Other (See Comments)    unknown    Home Medications:  (Not in a hospital admission)  OB/GYN Status:  No LMP for male patient.  General Assessment Data Location of Assessment: [facility] ED TTS Assessment: In system Is this a Tele or Face-to-Face Assessment?: Face-to-Face Is this an Initial Assessment or a Re-assessment for  this encounter?: Initial Assessment Marital status: Single Maiden name: n/a Is patient pregnant?: No Pregnancy Status: No Living Arrangements: Group Home Can pt return to current living arrangement?: Yes Admission Status: Involuntary Is patient capable of signing voluntary admission?: No Referral Source: Self/Family/Friend Insurance type: Medicare  Medical Screening Exam ([place]) Medical Exam completed: Yes  Crisis Care Plan Living Arrangements: Group Home Legal Guardian: Other: (Reports of none) Name of Psychiatrist: Reports of none Name of Therapist: Reports of none  Education Status Is  patient currently in school?: No Current Grade: n/a Highest grade of school patient has completed: 9th Grade Name of school: n/a Contact person: n/a  Risk to self with the past 6 months Suicidal Ideation: No Has patient been a risk to self within the past 6 months prior to admission? : No Suicidal Intent: No Has patient had any suicidal intent within the past 6 months prior to admission? : No Is patient at risk for suicide?: No Suicidal Plan?: No Has patient had any suicidal plan within the past 6 months prior to admission? : No Access to Means: No What has been your use of drugs/alcohol within the last 12 months?: Alcohol and THC Previous Attempts/Gestures: No How many times?: 0 Other Self Harm Risks: Reports of none Triggers for Past Attempts: None known Intentional Self Injurious Behavior: None Family Suicide History: No Recent stressful life event(s): Other (Comment) (Reports of none) Persecutory voices/beliefs?: No Depression: No Depression Symptoms:  (Patient denies symptoms) Substance abuse history and/or treatment for substance abuse?: No Suicide prevention information given to non-admitted patients: Not applicable  Risk to Others within the past 6 months Homicidal Ideation: No Does patient have any lifetime risk of violence toward others beyond the six months prior to admission? : No Thoughts of Harm to Others: No Current Homicidal Intent: No Current Homicidal Plan: No Access to Homicidal Means: No Identified Victim: Reports of none History of harm to others?: No Assessment of Violence: None Noted Violent Behavior Description: Reports of none Does patient have access to weapons?: No Criminal Charges Pending?: No Does patient have a court date: No Is patient on probation?: No  Psychosis Hallucinations: None noted Delusions: None noted  Mental Status Report Appearance/Hygiene: In scrubs, Unremarkable, In hospital gown Eye Contact: Good Motor Activity:  Freedom of movement, Unremarkable Speech: Logical/coherent Level of Consciousness: Alert Mood: Pleasant, Euthymic Affect: Appropriate to circumstance Anxiety Level: None Thought Processes: Coherent, Relevant Judgement: Unimpaired Orientation: Person, Place, Time, Situation, Appropriate for developmental age Obsessive Compulsive Thoughts/Behaviors: Minimal  Cognitive Functioning Concentration: Normal Memory: Recent Intact, Remote Intact IQ: Average Insight: Fair Impulse Control: Fair Appetite: Good Weight Loss: 0 Weight Gain: 0 Sleep: No Change Total Hours of Sleep: 8 Vegetative Symptoms: None  ADLScreening [facility] Assessment Services) Patient's cognitive ability adequate to safely complete daily activities?: Yes Patient able to express need for assistance with ADLs?: Yes Independently performs ADLs?: Yes (appropriate for developmental age)  Prior Inpatient Therapy Prior Inpatient Therapy: Yes Prior Therapy Dates: 1968 Prior Therapy Facilty/Provider(s): Patient unable to remember Reason for Treatment: Alcohol  Prior Outpatient Therapy Prior Outpatient Therapy: No Prior Therapy Dates: Reports of none Prior Therapy Facilty/Provider(s): Reports of none Reason for Treatment: Reports of none Does patient have an ACCT team?: No Does patient have Intensive In-House Services?  : No Does patient have [facility] services? : No Does patient have P4CC services?: No  ADL Screening (condition at time of admission) Patient's cognitive ability adequate to safely complete daily activities?: Yes Is the patient deaf  or have difficulty hearing?: No Does the patient have difficulty seeing, even when wearing glasses/contacts?: No Does the patient have difficulty concentrating, remembering, or making decisions?: No Patient able to express need for assistance with ADLs?: Yes Does the patient have difficulty dressing or bathing?: No Independently performs ADLs?: Yes (appropriate for developmental  age) Does the patient have difficulty walking or climbing stairs?: No Weakness of Legs: None Weakness of Arms/Hands: None  Home Assistive Devices/Equipment Home Assistive Devices/Equipment: None  Therapy Consults (therapy consults require a physician order) PT Evaluation Needed: No OT Evalulation Needed: No SLP Evaluation Needed: No Abuse/Neglect Assessment (Assessment to be complete while patient is alone) Physical Abuse: Denies Verbal Abuse: Denies Sexual Abuse: Denies Exploitation of patient/patient's resources: Denies Self-Neglect: Denies Values / Beliefs Cultural Requests During Hospitalization: None Spiritual Requests During Hospitalization: None Consults Spiritual Care Consult Needed: No Social Work Consult Needed: No Regulatory affairs officer (For Healthcare) Does patient have an advance directive?: No Would patient like information on creating an advanced directive?: Yes Higher education careers adviser given    Additional Information 1:1 In Past 12 Months?: No CIRT Risk: No Elopement Risk: No Does patient have medical clearance?: Yes  Child/Adolescent Assessment Running Away Risk: Denies (Patient is an adult)  Disposition:  Disposition Initial Assessment Completed for this Encounter: Yes Disposition of Patient: Other dispositions (ER MD ordered Psych Consult)  On Site Evaluation by:   Reviewed with Physician:    Gunnar Fusi MS, LCAS, LPC, [place], CCSI Therapeutic Triage Specialist 10/21/2015 3:59 PM

## 2015-10-21 NOTE — ED Notes (Signed)
Pt given lunch tray.

## 2015-10-21 NOTE — ED Notes (Signed)
Pt transferred to [facility] from main   ED pt is ambulatory without assistance ,his only c/o is that he is cold . He states that he was just outside getting exercise he says "I guess they called the cops on me cause I am wacko" he claims he is fine he has no si/hi or avh.He is very thin  But appears in no acute distress he is waiting to be seen by psych MD

## 2015-10-21 NOTE — ED Notes (Signed)
even though pt states he is freezing and wants coffee he took off all his clothes to sleep he says he always sleeps naked

## 2015-10-21 NOTE — ED Notes (Signed)
Pt given TV remote and two warm blankets.

## 2015-10-21 NOTE — ED Notes (Signed)
Sandwich and grape juice given.

## 2015-10-22 DIAGNOSIS — F0391 Unspecified dementia with behavioral disturbance: Secondary | ICD-10-CM | POA: Diagnosis not present

## 2015-10-22 NOTE — ED Notes (Signed)
Patient assigned to appropriate care area. Patient oriented to unit/care area: Informed that, for their safety, care areas are designed for safety and monitored by security cameras at all times; and visiting hours explained to patient. Patient verbalizes understanding, and verbal contract for safety obtained. 

## 2015-10-22 NOTE — ED Notes (Signed)

## 2015-10-22 NOTE — BHH Counselor (Signed)
Referral information for Geriatric Placement have been faxed to:  [facility] [phone])  [facility] [phone])  [facility] [phone])  [facility] [phone])  [facility] [phone])

## 2015-10-22 NOTE — ED Notes (Signed)
IVC/Pending Geri-psych placement

## 2015-10-22 NOTE — ED Notes (Signed)
Pt offered meal tray but reported being finished after a few bites. When asked if pt would prefer to keep meal tray near him in the event he was hungry later he reported he "won't be here long" and he was planning  "to make his way out" despite IVC status. Security notified.

## 2015-10-22 NOTE — ED Notes (Signed)
Patient currently denies SI/HI/AVH and pain. Patient says the he is bored and that he does not understand why he is in the hospital. Patient is calm and cooperative. Is currently resting in room with underwear on his head saying that his ears are cold. Maintained on 15 minute checks and observation by security camera for safety.

## 2015-10-22 NOTE — Consult Note (Signed)
[place] Psychiatry Consult   Reason for Consult:  Consult for 70 year old man with a history of alcohol abuse and alcohol related dementia brought in with worsening behavior problems at his living facility Referring Physician:  Clearnce Hasten Patient Identification: [name] MRN:  [id number] Principal Diagnosis: Dementia with behavioral disturbance Diagnosis:   Patient Active Problem List   Diagnosis Date Noted  . Dementia with behavioral disturbance [F03.91] 10/21/2015    Total Time spent with patient: 20 minutes  Subjective:   [name] is a 70 y.o. male patient admitted with "I just like to get out".   Follow-up for Tuesday the fifth. Patient has no new complaints. He has not shown any dangerous or agitated behavior. Still having some significant memory problems. Poor self-care. Poor insight.  HPI:  Patient interviewed. Chart reviewed. 70 year old man with a history of alcohol abuse and alcohol related dementia. Had recently been living at a group home or assisted-living kind of facility. Has been getting more agitated wandering away and now at one point found lying down out in the middle of the street. Patient not able to give any coherent explanation for this makes sense. Denies any suicidal thoughts. Denies having any hallucinations. He appears confused although somewhat jolly and pleasant.  Medical history: Long-standing alcohol abuse problem. No other known active ongoing medical problems right now.  Social history: Daughter appears to be closest relative. Not sure if anyone has been appointed guardian or power of attorney. Recently had been living in an assisted living facility or group home of some sort.  Substance abuse history: Long-standing documented history of alcohol abuse with behavior problems alcohol withdrawal but no clear delirium tremens. Has been long resistant to engaging in any kind of actual substance abuse treatment.  Past Psychiatric History: No  history of psychiatric problems other than alcohol abuse  Risk to Self: Suicidal Ideation: No Suicidal Intent: No Is patient at risk for suicide?: No Suicidal Plan?: No Access to Means: No What has been your use of drugs/alcohol within the last 12 months?: Alcohol and THC How many times?: 0 Other Self Harm Risks: Reports of none Triggers for Past Attempts: None known Intentional Self Injurious Behavior: None Risk to Others: Homicidal Ideation: No Thoughts of Harm to Others: No Current Homicidal Intent: No Current Homicidal Plan: No Access to Homicidal Means: No Identified Victim: Reports of none History of harm to others?: No Assessment of Violence: None Noted Violent Behavior Description: Reports of none Does patient have access to weapons?: No Criminal Charges Pending?: No Does patient have a court date: No Prior Inpatient Therapy: Prior Inpatient Therapy: Yes Prior Therapy Dates: 1968 Prior Therapy Facilty/Provider(s): Patient unable to remember Reason for Treatment: Alcohol Prior Outpatient Therapy: Prior Outpatient Therapy: No Prior Therapy Dates: Reports of none Prior Therapy Facilty/Provider(s): Reports of none Reason for Treatment: Reports of none Does patient have an ACCT team?: No Does patient have Intensive In-House Services?  : No Does patient have [facility] services? : No Does patient have P4CC services?: No  Past Medical History:  Past Medical History:  Diagnosis Date  . Alcohol abuse   . Cirrhosis ([place])    History reviewed. No pertinent surgical history. Family History: No family history on file. Family Psychiatric  History: No known family history and the patient is not aware of any. Social History:  History  Alcohol Use No     History  Drug use: Unknown    Social History   Social History  . Marital status: Divorced  Spouse name: N/A  . Number of children: N/A  . Years of education: N/A   Social History Main Topics  . Smoking status:  Current Every Day Smoker    Packs/day: 0.50    Types: Cigarettes  . Smokeless tobacco: Never Used  . Alcohol use No  . Drug use: Unknown  . Sexual activity: Not Asked   Other Topics Concern  . None   Social History Narrative  . None   Additional Social History:    Allergies:   Allergies  Allergen Reactions  . Penicillins Other (See Comments)    unknown    Labs:  Results for orders placed or performed during the hospital encounter of 10/21/15 (from the past 48 hour(s))  Urinalysis complete, with microscopic ([facility] only)     Status: Abnormal   Collection Time: 10/21/15 11:58 AM  Result Value Ref Range   Color, Urine YELLOW (A) YELLOW   APPearance CLEAR (A) CLEAR   Glucose, UA NEGATIVE NEGATIVE mg/dL   Bilirubin Urine NEGATIVE NEGATIVE   Ketones, ur NEGATIVE NEGATIVE mg/dL   Specific Gravity, Urine 1.010 1.005 - 1.030   Hgb urine dipstick NEGATIVE NEGATIVE   pH 5.0 5.0 - 8.0   Protein, ur NEGATIVE NEGATIVE mg/dL   Nitrite NEGATIVE NEGATIVE   Leukocytes, UA NEGATIVE NEGATIVE   RBC / HPF NONE SEEN 0 - 5 RBC/hpf   WBC, UA 0-5 0 - 5 WBC/hpf   Bacteria, UA NONE SEEN NONE SEEN   Squamous Epithelial / LPF NONE SEEN NONE SEEN   Mucous PRESENT   CBC with Differential     Status: Abnormal   Collection Time: 10/21/15 11:58 AM  Result Value Ref Range   WBC 6.2 3.8 - 10.6 K/uL   RBC 3.60 (L) 4.40 - 5.90 MIL/uL   Hemoglobin 12.2 (L) 13.0 - 18.0 g/dL   HCT 34.8 (L) 40.0 - 52.0 %   MCV 96.7 80.0 - 100.0 fL   MCH 33.8 26.0 - 34.0 pg   MCHC 35.0 32.0 - 36.0 g/dL   RDW 15.0 (H) 11.5 - 14.5 %   Platelets 215 150 - 440 K/uL   Neutrophils Relative % 54 %   Neutro Abs 3.3 1.4 - 6.5 K/uL   Lymphocytes Relative 28 %   Lymphs Abs 1.7 1.0 - 3.6 K/uL   Monocytes Relative 14 %   Monocytes Absolute 0.9 0.2 - 1.0 K/uL   Eosinophils Relative 3 %   Eosinophils Absolute 0.2 0 - 0.7 K/uL   Basophils Relative 1 %   Basophils Absolute 0.1 0 - 0.1 K/uL  Comprehensive metabolic panel      Status: Abnormal   Collection Time: 10/21/15 11:58 AM  Result Value Ref Range   Sodium 139 135 - 145 mmol/L   Potassium 3.8 3.5 - 5.1 mmol/L   Chloride 107 101 - 111 mmol/L   CO2 27 22 - 32 mmol/L   Glucose, Bld 83 65 - 99 mg/dL   BUN 6 6 - 20 mg/dL   Creatinine, Ser 0.63 0.61 - 1.24 mg/dL   Calcium 9.0 8.9 - 10.3 mg/dL   Total Protein 6.6 6.5 - 8.1 g/dL   Albumin 3.1 (L) 3.5 - 5.0 g/dL   AST 68 (H) 15 - 41 U/L   ALT 39 17 - 63 U/L   Alkaline Phosphatase 72 38 - 126 U/L   Total Bilirubin 0.6 0.3 - 1.2 mg/dL   GFR calc non Af Amer >60 >60 mL/min   GFR calc Af Amer >60 >  60 mL/min    Comment: (NOTE) The eGFR has been calculated using the CKD EPI equation. This calculation has not been validated in all clinical situations. eGFR's persistently <60 mL/min signify possible Chronic Kidney Disease.    Anion gap 5 5 - 15  Ethanol     Status: None   Collection Time: 10/21/15 11:58 AM  Result Value Ref Range   Alcohol, Ethyl (B) <5 <5 mg/dL    Comment:        LOWEST DETECTABLE LIMIT FOR SERUM ALCOHOL IS 5 mg/dL FOR MEDICAL PURPOSES ONLY   Troponin I     Status: None   Collection Time: 10/21/15 11:58 AM  Result Value Ref Range   Troponin I <0.03 <0.03 ng/mL    No current facility-administered medications for this encounter.    Current Outpatient Prescriptions  Medication Sig Dispense Refill  . ADVAIR DISKUS 250-50 MCG/DOSE AEPB Inhale 2 puffs into the lungs daily.    Marland Kitchen alendronate (FOSAMAX) 70 MG tablet Take 1 tablet by mouth once a week.    . folic acid (FOLVITE) 1 MG tablet Take 1 mg by mouth daily.    . furosemide (LASIX) 20 MG tablet Take 1 tablet by mouth daily.    . Ledipasvir-Sofosbuvir (HARVONI) 90-400 MG TABS Take 1 tablet by mouth daily. 12 weeks,    . LORazepam (ATIVAN) 0.5 MG tablet Take 1 tablet by mouth 2 (two) times daily.    . magnesium oxide (MAG-OX) 400 MG tablet Take 400 mg by mouth daily.    . meloxicam (MOBIC) 7.5 MG tablet Take 1 tablet by mouth daily.     . Multiple Vitamins-Minerals (MULTIVITAMIN WITH MINERALS) tablet Take 1 tablet by mouth daily.    . nadolol (CORGARD) 20 MG tablet Take 1 tablet by mouth daily.    . potassium chloride (K-DUR) 10 MEQ tablet Take 1 tablet by mouth daily.    Marland Kitchen SPIRIVA HANDIHALER 18 MCG inhalation capsule Place 1 capsule into inhaler and inhale daily.    Marland Kitchen spironolactone (ALDACTONE) 50 MG tablet Take 1 tablet by mouth daily.    . tamsulosin (FLOMAX) 0.4 MG CAPS capsule Take 1 capsule by mouth 2 (two) times daily.    Marland Kitchen thiamine 100 MG tablet Take 100 mg by mouth daily.      Musculoskeletal: Strength & Muscle Tone: decreased Gait & Station: unsteady Patient leans: N/A  Psychiatric Specialty Exam: Physical Exam  Nursing note and vitals reviewed. Constitutional: He appears cachectic.  HENT:  Head: Normocephalic and atraumatic.  Eyes: Conjunctivae are normal. Pupils are equal, round, and reactive to light.  Neck: Normal range of motion.  Cardiovascular: Regular rhythm and normal heart sounds.   Respiratory: Effort normal. No respiratory distress.  GI: Soft.  Musculoskeletal: Normal range of motion.  Neurological: He is alert.  Skin: Skin is warm and dry.  Psychiatric: His affect is blunt. His speech is delayed. He is slowed. Cognition and memory are impaired. He expresses impulsivity. He expresses no suicidal ideation.    Review of Systems  Constitutional: Negative.   HENT: Negative.   Eyes: Negative.   Respiratory: Negative.   Cardiovascular: Negative.   Gastrointestinal: Negative.   Musculoskeletal: Negative.   Skin: Negative.   Neurological: Negative.   Psychiatric/Behavioral: Positive for memory loss. Negative for depression, hallucinations, substance abuse and suicidal ideas. The patient is not nervous/anxious and does not have insomnia.     Blood pressure 125/78, pulse 61, temperature 98.7 F (37.1 C), temperature source Oral, resp. rate 17, height  5' 10" (1.778 m), weight 58.1 kg (128 lb),  SpO2 98 %.Body mass index is 18.37 kg/m.  General Appearance: Disheveled  Eye Contact:  Fair  Speech:  Garbled and Slow  Volume:  Decreased  Mood:  Euthymic  Affect:  Constricted  Thought Process:  Disorganized  Orientation:  Negative  Thought Content:  Illogical  Suicidal Thoughts:  No  Homicidal Thoughts:  No  Memory:  Immediate;   Fair Recent;   Poor Remote;   Fair  Judgement:  Impaired  Insight:  Lacking  Psychomotor Activity:  Decreased  Concentration:  Concentration: Poor  Recall:  Poor  Fund of Knowledge:  Poor  Language:  Poor  Akathisia:  No  Handed:  Right  AIMS (if indicated):     Assets:  Housing Social Support  ADL's:  Impaired  Cognition:  Impaired,  Mild  Sleep:        Treatment Plan Summary: Medication management and Plan Continue modest dose of risperidone. Still working on possible transfer to geriatric psychiatry unit.  Disposition: Recommend psychiatric Inpatient admission when medically cleared.  Alethia Berthold, MD 10/22/2015 11:36 PM

## 2015-10-22 NOTE — ED Notes (Signed)
Patient's sister states that patient's social worker is: Musician Thaxton [phone]

## 2015-10-22 NOTE — ED Notes (Signed)
Handoff given to [place], Therapist, sports.

## 2015-10-23 NOTE — ED Notes (Signed)
IVC/Pending Geri-psych placement

## 2015-10-23 NOTE — ED Notes (Signed)
Lunch was given to patient 

## 2015-10-23 NOTE — Progress Notes (Signed)
CSW followed up on the following referrals for pt:  [name] [phone]) is currently at capacity.  [name] [phone]) is currently at capacity.  [name] [phone]) did not receive initial fax. CSW resent information via fax number [phone].  [name] [phone]) did not answer. CSW left a message.  [name] [phone]) reports that pt is currently on the waitlist.  Georga Kaufmann, MSW, [place]

## 2015-10-23 NOTE — ED Notes (Signed)
This RN spoke to Dr. Joya Martyr in regards to patients medications. No new orders at this time.

## 2015-10-23 NOTE — ED Notes (Signed)
Breakfast was given to patient. 

## 2015-10-23 NOTE — ED Notes (Signed)
Pt lying in stretcher reading his book with briefs on his head. Pt calm and cooperative.

## 2015-10-23 NOTE — ED Notes (Signed)
IVC/Geri-psych placement

## 2015-10-23 NOTE — ED Notes (Signed)
Gave pt [place] snack tray and gingerale

## 2015-10-23 NOTE — Progress Notes (Signed)
CSW sent referral for pt to [facility].  P: [phone] F: [place], MSW, [place]

## 2015-10-23 NOTE — ED Notes (Signed)
Patient refused shower.

## 2015-10-23 NOTE — ED Provider Notes (Signed)
-----------------------------------------   6:44 AM on 10/23/2015 -----------------------------------------   Blood pressure 118/74, pulse 65, temperature 98.2 F (36.8 C), temperature source Oral, resp. rate 18, height '5\' 10"'$  (1.778 m), weight 128 lb (58.1 kg), SpO2 95 %.  The patient had no acute events since last update.  Calm and cooperative at this time.  Disposition is pending Psychiatry/Behavioral Medicine team recommendations.     Paulette Blanch, MD 10/23/15 [phone]

## 2015-10-24 DIAGNOSIS — F0391 Unspecified dementia with behavioral disturbance: Secondary | ICD-10-CM | POA: Diagnosis not present

## 2015-10-24 MED ORDER — MELOXICAM 7.5 MG PO TABS
7.5000 mg | ORAL_TABLET | Freq: Every day | ORAL | Status: DC
  Administered 2015-10-24 – 2015-10-25 (×2): 7.5 mg via ORAL
  Filled 2015-10-24 (×2): qty 1

## 2015-10-24 MED ORDER — MAGNESIUM OXIDE 400 (241.3 MG) MG PO TABS
800.0000 mg | ORAL_TABLET | Freq: Three times a day (TID) | ORAL | Status: DC
  Administered 2015-10-24 – 2015-10-25 (×4): 800 mg via ORAL
  Filled 2015-10-24: qty 2

## 2015-10-24 MED ORDER — NADOLOL 20 MG PO TABS
10.0000 mg | ORAL_TABLET | Freq: Two times a day (BID) | ORAL | Status: DC
  Administered 2015-10-24 – 2015-10-25 (×2): 10 mg via ORAL
  Filled 2015-10-24 (×4): qty 1

## 2015-10-24 MED ORDER — ADULT MULTIVITAMIN W/MINERALS CH
1.0000 | ORAL_TABLET | Freq: Every day | ORAL | Status: DC
  Administered 2015-10-24 – 2015-10-25 (×2): 1 via ORAL
  Filled 2015-10-24: qty 1

## 2015-10-24 MED ORDER — ALENDRONATE SODIUM 70 MG PO TABS: 70.0000 mg | ORAL_TABLET | ORAL | Status: DC

## 2015-10-24 MED ORDER — VITAMIN B-1 100 MG PO TABS
ORAL_TABLET | ORAL | Status: AC
  Filled 2015-10-24: qty 1

## 2015-10-24 MED ORDER — SPIRONOLACTONE 50 MG PO TABS
50.0000 mg | ORAL_TABLET | Freq: Every day | ORAL | Status: DC
  Administered 2015-10-24 – 2015-10-25 (×2): 50 mg via ORAL
  Filled 2015-10-24 (×2): qty 1

## 2015-10-24 MED ORDER — LACTULOSE 10 GM/15ML PO SOLN
20.0000 g | Freq: Three times a day (TID) | ORAL | Status: DC
  Administered 2015-10-24 – 2015-10-25 (×4): 20 g via ORAL
  Filled 2015-10-24 (×4): qty 30

## 2015-10-24 MED ORDER — MAGNESIUM OXIDE 400 (241.3 MG) MG PO TABS
ORAL_TABLET | ORAL | Status: AC
  Administered 2015-10-24: 800 mg via ORAL
  Filled 2015-10-24: qty 2

## 2015-10-24 MED ORDER — FUROSEMIDE 40 MG PO TABS
20.0000 mg | ORAL_TABLET | Freq: Every day | ORAL | Status: DC
  Administered 2015-10-24 – 2015-10-25 (×2): 20 mg via ORAL
  Filled 2015-10-24 (×3): qty 1

## 2015-10-24 MED ORDER — FOLIC ACID 1 MG PO TABS
1.0000 mg | ORAL_TABLET | Freq: Every day | ORAL | Status: DC
  Administered 2015-10-24 – 2015-10-25 (×2): 1 mg via ORAL
  Filled 2015-10-24 (×2): qty 1

## 2015-10-24 MED ORDER — ALBUTEROL SULFATE HFA 108 (90 BASE) MCG/ACT IN AERS
2.0000 | INHALATION_SPRAY | Freq: Four times a day (QID) | RESPIRATORY_TRACT | Status: DC | PRN
  Filled 2015-10-24: qty 6.7

## 2015-10-24 MED ORDER — TAMSULOSIN HCL 0.4 MG PO CAPS
0.4000 mg | ORAL_CAPSULE | Freq: Two times a day (BID) | ORAL | Status: DC
  Administered 2015-10-24 – 2015-10-25 (×3): 0.4 mg via ORAL
  Filled 2015-10-24 (×3): qty 1

## 2015-10-24 MED ORDER — ADULT MULTIVITAMIN W/MINERALS CH
ORAL_TABLET | ORAL | Status: AC
Start: 2015-10-24 — End: 2015-10-24
  Administered 2015-10-24: 1 via ORAL
  Filled 2015-10-24: qty 1

## 2015-10-24 MED ORDER — MOMETASONE FURO-FORMOTEROL FUM 200-5 MCG/ACT IN AERO
2.0000 | INHALATION_SPRAY | Freq: Two times a day (BID) | RESPIRATORY_TRACT | Status: DC
  Administered 2015-10-24 – 2015-10-25 (×2): 2 via RESPIRATORY_TRACT
  Filled 2015-10-24: qty 8.8

## 2015-10-24 MED ORDER — VITAMIN B-1 100 MG PO TABS
100.0000 mg | ORAL_TABLET | Freq: Every day | ORAL | Status: DC
  Administered 2015-10-24: 100 mg via ORAL

## 2015-10-24 MED ORDER — POTASSIUM CHLORIDE ER 10 MEQ PO TBCR
10.0000 meq | EXTENDED_RELEASE_TABLET | Freq: Every day | ORAL | Status: DC
  Administered 2015-10-24 – 2015-10-25 (×2): 10 meq via ORAL
  Filled 2015-10-24 (×2): qty 1

## 2015-10-24 MED ORDER — VITAMIN D 1000 UNITS PO TABS
1000.0000 [IU] | ORAL_TABLET | Freq: Every day | ORAL | Status: DC
  Administered 2015-10-24 – 2015-10-25 (×2): 1000 [IU] via ORAL
  Filled 2015-10-24 (×2): qty 1

## 2015-10-24 MED ORDER — MAGNESIUM OXIDE 400 (241.3 MG) MG PO TABS
ORAL_TABLET | ORAL | Status: AC
  Filled 2015-10-24: qty 2

## 2015-10-24 MED ORDER — TIOTROPIUM BROMIDE MONOHYDRATE 18 MCG IN CAPS
1.0000 | ORAL_CAPSULE | Freq: Every day | RESPIRATORY_TRACT | Status: DC
  Administered 2015-10-24 – 2015-10-25 (×2): 18 ug via RESPIRATORY_TRACT
  Filled 2015-10-24: qty 5

## 2015-10-24 MED ORDER — LORAZEPAM 1 MG PO TABS
1.0000 mg | ORAL_TABLET | Freq: Every day | ORAL | Status: DC
  Administered 2015-10-24: 1 mg via ORAL
  Filled 2015-10-24: qty 1

## 2015-10-24 NOTE — Progress Notes (Signed)
CSW called pt's sister Mardene Celeste at [phone] to give her an update on how pt is doing. Mardene Celeste states that she will be over to visit the pt later this afternoon.  Georga Kaufmann, MSW, [place]

## 2015-10-24 NOTE — BH Assessment (Signed)
Patient sister came to the ER to visit him. After the visit she spoke with this Probation officer and shared she had concerns and that he wasn't at baseline. She states he was focused on "God taking care of him." Writer told her at this point still looking for a bed with a [facility].

## 2015-10-24 NOTE — ED Provider Notes (Signed)
-----------------------------------------   6:20 AM on 10/24/2015 -----------------------------------------   Blood pressure (!) 125/96, pulse 63, temperature 98.2 F (36.8 C), temperature source Oral, resp. rate 16, height '5\' 10"'$  (1.778 m), weight 128 lb (58.1 kg), SpO2 100 %.  The patient had no acute events since last update.  Calm and cooperative at this time.  Disposition is pending Psychiatry/Behavioral Medicine team recommendations.    Paulette Blanch, MD 10/24/15 [phone]

## 2015-10-24 NOTE — ED Notes (Signed)
Pt eating lunch at present.

## 2015-10-25 DIAGNOSIS — F0391 Unspecified dementia with behavioral disturbance: Secondary | ICD-10-CM | POA: Diagnosis not present

## 2015-10-25 MED ORDER — HYDROXYZINE HCL 25 MG PO TABS
50.0000 mg | ORAL_TABLET | Freq: Once | ORAL | Status: AC
  Administered 2015-10-25: 50 mg via ORAL
  Filled 2015-10-25 (×2): qty 2

## 2015-10-25 NOTE — BHH Counselor (Signed)
Call received from [facility] with [facility] reporting that patient has been accepted for admission at their [place] facility.  Attending physician is Dr. Jamse Arn.  Pt can arrive after 11 am.  Call report to [phone] unit 900.

## 2015-10-25 NOTE — ED Notes (Signed)
Pt offered meal tray

## 2015-10-25 NOTE — ED Notes (Signed)
Pt getting agitated in room and wants to be away from current roommate.  MD notified, medication ordered.  Pt transferred to 22B and is refusing medicine at this time, wants medicine if unable to sleep.

## 2015-10-25 NOTE — BH Assessment (Signed)
Writer received phone call from patient's sister ([phone]) and was informed the patient is going to be admitted to [facility] in [place]. She stated she was going to come to the ER and drop off clothes to take with him. Writer informed the sister that he doesn't know the time when she is going to leave but it may be within the hour.  Writer updated the patient nurse Vicente Males).

## 2015-10-25 NOTE — ED Provider Notes (Signed)
Accepted to outside facility by Dr. Kieth Brightly.   John Drafts, MD 10/25/15 [phone]

## 2015-10-25 NOTE — ED Provider Notes (Signed)
-----------------------------------------   7:36 AM on 10/25/2015 -----------------------------------------   Blood pressure 111/71, pulse 89, temperature 98 F (36.7 C), temperature source Oral, resp. rate 18, height '5\' 10"'$  (1.778 m), weight 128 lb (58.1 kg), SpO2 98 %.  The patient had no acute events since last update.  Calm and cooperative at this time.  Disposition is pending Psychiatry/Behavioral Medicine team recommendations.     Paulette Blanch, MD 10/25/15 [phone]

## 2015-10-25 NOTE — ED Notes (Signed)
Spoke to patient's sister who brought extra belongings for patient to take to behavioral health hospital.  Sister gave password and was informed that patient is being transferred to [facility] in [place].

## 2015-11-14 ENCOUNTER — Encounter: Payer: Self-pay | Admitting: Urgent Care

## 2015-11-14 DIAGNOSIS — J449 Chronic obstructive pulmonary disease, unspecified: Secondary | ICD-10-CM | POA: Insufficient documentation

## 2015-11-14 DIAGNOSIS — F17210 Nicotine dependence, cigarettes, uncomplicated: Secondary | ICD-10-CM | POA: Insufficient documentation

## 2015-11-14 DIAGNOSIS — F1010 Alcohol abuse, uncomplicated: Secondary | ICD-10-CM | POA: Diagnosis not present

## 2015-11-14 DIAGNOSIS — F10120 Alcohol abuse with intoxication, uncomplicated: Secondary | ICD-10-CM | POA: Insufficient documentation

## 2015-11-14 NOTE — ED Notes (Signed)
.  FIRST NURSE NOTE: Patient presents to ED in custody of [employer] officer Pride; patient VOLUNTARY at this point. Patient reported to be intoxicated; was found sitting on the side of the road with feet in the road drinking a "pint of liquor".

## 2015-11-14 NOTE — ED Notes (Signed)
Patient presents to ED with large walking stick. Patient intoxicated and is sitting in close proximity to another patient who is expressing SI/HI. Stick could be used as a weapon, and was therefore removed from patient's possession at this time, labeled with his name, and placed with other [place] belongings until disposition is decided upon.

## 2015-11-14 NOTE — ED Notes (Signed)
Brown,MD consulted. MD made aware of presenting complaints and triage assessment. MD with VORB for ETOH level only. Patient not to be dressed out; place in sub-wait with sitter per ED charge nurse. Orders to be entered and carried by this RN.

## 2015-11-15 ENCOUNTER — Emergency Department
Admission: EM | Admit: 2015-11-15 | Discharge: 2015-11-15 | Disposition: A | Discharge status: Home / Self Care | Payer: Medicare Other | Attending: Emergency Medicine | Admitting: Emergency Medicine

## 2015-11-15 DIAGNOSIS — F1010 Alcohol abuse, uncomplicated: Secondary | ICD-10-CM

## 2015-11-15 DIAGNOSIS — F10920 Alcohol use, unspecified with intoxication, uncomplicated: Secondary | ICD-10-CM

## 2015-11-15 DIAGNOSIS — F10120 Alcohol abuse with intoxication, uncomplicated: Secondary | ICD-10-CM | POA: Diagnosis not present

## 2015-11-15 LAB — COMPREHENSIVE METABOLIC PANEL
ALT: 42 U/L (ref 17–63)
ANION GAP: 3 — AB (ref 5–15)
AST: 59 U/L — ABNORMAL HIGH (ref 15–41)
Albumin: 3.2 g/dL — ABNORMAL LOW (ref 3.5–5.0)
Alkaline Phosphatase: 82 U/L (ref 38–126)
BILIRUBIN TOTAL: 0.5 mg/dL (ref 0.3–1.2)
BUN: 5 mg/dL — AB (ref 6–20)
CO2: 28 mmol/L (ref 22–32)
Calcium: 8.7 mg/dL — ABNORMAL LOW (ref 8.9–10.3)
Chloride: 105 mmol/L (ref 101–111)
Creatinine, Ser: 0.63 mg/dL (ref 0.61–1.24)
Glucose, Bld: 104 mg/dL — ABNORMAL HIGH (ref 65–99)
POTASSIUM: 3.3 mmol/L — AB (ref 3.5–5.1)
Sodium: 136 mmol/L (ref 135–145)
TOTAL PROTEIN: 6.7 g/dL (ref 6.5–8.1)

## 2015-11-15 LAB — CBC WITH DIFFERENTIAL/PLATELET
BASOS ABS: 0 10*3/uL (ref 0–0.1)
BASOS PCT: 0 %
EOS ABS: 0.1 10*3/uL (ref 0–0.7)
EOS PCT: 1 %
HCT: 36.5 % — ABNORMAL LOW (ref 40.0–52.0)
Hemoglobin: 12.5 g/dL — ABNORMAL LOW (ref 13.0–18.0)
Lymphocytes Relative: 24 %
Lymphs Abs: 2.1 10*3/uL (ref 1.0–3.6)
MCH: 33.7 pg (ref 26.0–34.0)
MCHC: 34.3 g/dL (ref 32.0–36.0)
MCV: 98.4 fL (ref 80.0–100.0)
Monocytes Absolute: 1.1 10*3/uL — ABNORMAL HIGH (ref 0.2–1.0)
Monocytes Relative: 13 %
Neutro Abs: 5.2 10*3/uL (ref 1.4–6.5)
Neutrophils Relative %: 62 %
PLATELETS: 192 10*3/uL (ref 150–440)
RBC: 3.71 MIL/uL — AB (ref 4.40–5.90)
RDW: 13.8 % (ref 11.5–14.5)
WBC: 8.5 10*3/uL (ref 3.8–10.6)

## 2015-11-15 LAB — SALICYLATE LEVEL: Salicylate Lvl: <4 mg/dL (ref 2.8–30.0)

## 2015-11-15 LAB — ACETAMINOPHEN LEVEL

## 2015-11-15 LAB — LIPASE, BLOOD: LIPASE: 87 U/L — AB (ref 11–51)

## 2015-11-15 LAB — ETHANOL: Alcohol, Ethyl (B): 215 mg/dL — ABNORMAL HIGH (ref <5)

## 2015-11-15 NOTE — ED Provider Notes (Signed)
[facility] Emergency Department Provider Note   ____________________________________________   First MD Initiated Contact with Patient 11/15/15 [phone]     (approximate)  I have reviewed the triage vital signs and the nursing notes.   HISTORY  Chief Complaint Alcohol Intoxication and Gait Problem  History limited by ethanol intoxication  HPI [name] is a 70 y.o. male patient brought in intoxicated was sitting on the side of the road drinking a plan of liquor. The ER patient wakes up does not complain of anything since he is feeling okay and wants to go back to sleep again.Patient denies any coughing nausea vomiting abdominal pain.   Past Medical History:  Diagnosis Date  . Alcohol abuse   . Cirrhosis [facility])     Patient Active Problem List   Diagnosis Date Noted  . Dementia with behavioral disturbance 10/21/2015    History reviewed. No pertinent surgical history.  Prior to Admission medications   Medication Sig Start Date End Date Taking? Authorizing Provider  ADVAIR DISKUS 250-50 MCG/DOSE AEPB Inhale 2 puffs into the lungs daily. 08/29/15   Historical Provider, MD  albuterol (PROVENTIL HFA;VENTOLIN HFA) 108 (90 Base) MCG/ACT inhaler Inhale 2 puffs into the lungs every 6 (six) hours as needed for wheezing or shortness of breath.    Historical Provider, MD  alendronate (FOSAMAX) 70 MG tablet Take 70 mg by mouth once a week.  09/24/15   Historical Provider, MD  cholecalciferol (VITAMIN D) 1000 units tablet Take 5,000 Units by mouth daily.    Historical Provider, MD  folic acid (FOLVITE) 1 MG tablet Take 1 mg by mouth daily.    Historical Provider, MD  furosemide (LASIX) 20 MG tablet Take 20 mg by mouth daily.  10/14/15   Historical Provider, MD  lactulose (CHRONULAC) 10 GM/15ML solution Take 20 g by mouth 3 (three) times daily.    Historical Provider, MD  Ledipasvir-Sofosbuvir (HARVONI) 90-400 MG TABS Take 1 tablet by mouth daily. 12 weeks,     Historical Provider, MD  LORazepam (ATIVAN) 0.5 MG tablet Take 1 mg by mouth at bedtime.  09/16/15   Historical Provider, MD  magnesium oxide (MAG-OX) 400 MG tablet Take 800 mg by mouth 3 (three) times daily.     Historical Provider, MD  Melatonin 3 MG TABS Take 6 mg by mouth at bedtime.    Historical Provider, MD  meloxicam (MOBIC) 7.5 MG tablet Take 7.5 mg by mouth daily.  10/14/15   Historical Provider, MD  Multiple Vitamins-Minerals (MULTIVITAMIN WITH MINERALS) tablet Take 1 tablet by mouth daily.    Historical Provider, MD  nadolol (CORGARD) 20 MG tablet Take 10 mg by mouth 2 (two) times daily.  09/16/15   Historical Provider, MD  potassium chloride (K-DUR) 10 MEQ tablet Take 20 mEq by mouth daily.  10/14/15   Historical Provider, MD  SPIRIVA HANDIHALER 18 MCG inhalation capsule Place 18 mcg into inhaler and inhale daily.  10/07/15   Historical Provider, MD  spironolactone (ALDACTONE) 50 MG tablet Take 50 mg by mouth daily.  10/14/15   Historical Provider, MD  tamsulosin (FLOMAX) 0.4 MG CAPS capsule Take 0.4 mg by mouth 2 (two) times daily.  10/14/15   Historical Provider, MD  thiamine 100 MG tablet Take 100 mg by mouth daily.    Historical Provider, MD    Allergies Penicillins  No family history on file.  Social History Social History  Substance Use Topics  . Smoking status: Current Every Day Smoker  Packs/day: 0.50    Types: Cigarettes  . Smokeless tobacco: Never Used  . Alcohol use No    Review of Systems Constitutional: No fever/chills Eyes: No visual changes. ENT: No sore throat. Cardiovascular: Denies chest pain. Respiratory: Denies shortness of breath. Gastrointestinal: No abdominal pain.  No nausea, no vomiting.  No diarrhea.  No constipation. Genitourinary: Negative for dysuria.  10-point ROS otherwise negative.  ____________________________________________   PHYSICAL EXAM:  VITAL SIGNS: ED Triage Vitals  Enc Vitals Group     BP 11/14/15 2328 (!) 104/56      Pulse Rate 11/14/15 2328 73     Resp 11/14/15 2328 18     Temp 11/14/15 2328 98.6 F (37 C)     Temp Source 11/14/15 2328 Oral     SpO2 11/14/15 2328 98 %     Weight 11/14/15 2329 128 lb (58.1 kg)     Height 11/14/15 2329 '5\' 10"'$  (1.778 m)     Head Circumference --      Peak Flow --      Pain Score --      Pain Loc --      Pain Edu? --      Excl. in [place]? --     Constitutional: Sleeping but easily arousable Well appearing and in no acute distress. Eyes: Conjunctivae are normal. PERRL. EOMI. Head: Atraumatic. Nose: No congestion/rhinnorhea. Mouth/Throat: Mucous membranes are moist.  Oropharynx non-erythematous. Neck: No stridor. Cardiovascular: Normal rate, regular rhythm. Grossly normal heart sounds.  Good peripheral circulation. Respiratory: Normal respiratory effort.  No retractions. Lungs CTAB. Gastrointestinal: Soft and nontender. No distention. No abdominal bruits. No CVA tenderness. Musculoskeletal: No lower extremity tenderness nor edema.  No joint effusions. Neurologic:  Normal speech and language. No gross focal neurologic deficits are appreciated.  Skin:  Skin is warm, dry and intact. No rash noted.   ____________________________________________   LABS (all labs ordered are listed, but only abnormal results are displayed)  Labs Reviewed  ETHANOL - Abnormal; Notable for the following:       Result Value   Alcohol, Ethyl (B) 215 (*)    All other components within normal limits  LIPASE, BLOOD - Abnormal; Notable for the following:    Lipase 87 (*)    All other components within normal limits  CBC WITH DIFFERENTIAL/PLATELET - Abnormal; Notable for the following:    RBC 3.71 (*)    Hemoglobin 12.5 (*)    HCT 36.5 (*)    Monocytes Absolute 1.1 (*)    All other components within normal limits  ACETAMINOPHEN LEVEL - Abnormal; Notable for the following:    Acetaminophen (Tylenol), Serum <10 (*)    All other components within normal limits  COMPREHENSIVE METABOLIC  PANEL - Abnormal; Notable for the following:    Potassium 3.3 (*)    Glucose, Bld 104 (*)    BUN 5 (*)    Calcium 8.7 (*)    Albumin 3.2 (*)    AST 59 (*)    Anion gap 3 (*)    All other components within normal limits  SALICYLATE LEVEL   ____________________________________________  EKG   ____________________________________________  RADIOLOGY   ____________________________________________   PROCEDURES  Procedure(s) performed:   Procedures  Critical Care performed:   ____________________________________________   INITIAL IMPRESSION / ASSESSMENT AND PLAN / ED COURSE  Pertinent labs & imaging results that were available during my care of the patient were reviewed by me and considered in my medical decision making (see chart for  details).    Clinical Course     ____________________________________________   FINAL CLINICAL IMPRESSION(S) / ED DIAGNOSES  Final diagnoses:  Alcohol intoxication, uncomplicated ([place])  Alcohol abuse      NEW MEDICATIONS STARTED DURING THIS VISIT:  New Prescriptions   No medications on file     Note:  This document was prepared using Dragon voice recognition software and may include unintentional dictation errors.    Nena Polio, MD 11/15/15 [phone]

## 2015-11-15 NOTE — ED Provider Notes (Signed)
-----------------------------------------   7:46 AM on 11/15/2015 ----------------------------------------- Care was assumed from Dr. Cinda Quest at 7 AM. Briefly, this is a 70 year old male presenting with alcohol intoxication. Patient is awake, alert, oriented, ambulatory, appears clinically sober. He reports to me that he would like to go home, he dues not want to pursue detox at this time. His vital signs are stable, he is afebrile. His lipase was elevated at 87 however he has no abdominal pain, nausea or vomiting, is tolerating by mouth intake, not consistent with  clinically significant pancreatitis. We'll DC with return precautions and follow-up. DC home.   Joanne Gavel, MD 11/15/15 [phone]

## 2015-11-15 NOTE — ED Notes (Signed)
BEHAVIORAL HEALTH ROUNDING  Patient sleeping: Yes Patient alert and oriented: Sleeping Behavior appropriate: Yes. ; If no, describe:  Nutrition and fluids offered: No, sleeping  Toileting and hygiene offered: No, sleeping  Sitter present: q15 minute observations and security monitoring  Law enforcement present: Yes ODS 

## 2015-11-15 NOTE — ED Notes (Signed)
Pt from [facility]. Staff called and states that they will call back in a few minutes once they try to find transportation for the patient.

## 2015-11-15 NOTE — ED Notes (Signed)
Pt ambulatory to bathroom. Steady gait. Pt given phone to call for a ride. States he has a sister who may can pick him up.

## 2015-11-15 NOTE — ED Notes (Signed)
Spoke with staff at the group They have no one who can come to pick him up at this time. P[t t

## 2015-11-15 NOTE — ED Notes (Signed)
Attempted to assess pt but pt is asleep at this time. Pt in NAD.

## 2015-11-18 ENCOUNTER — Emergency Department
Admission: EM | Admit: 2015-11-18 | Discharge: 2015-11-18 | Disposition: A | Discharge status: Home / Self Care | Payer: Medicare Other | Attending: Emergency Medicine | Admitting: Emergency Medicine

## 2015-11-18 DIAGNOSIS — J449 Chronic obstructive pulmonary disease, unspecified: Secondary | ICD-10-CM | POA: Diagnosis not present

## 2015-11-18 DIAGNOSIS — F0391 Unspecified dementia with behavioral disturbance: Secondary | ICD-10-CM

## 2015-11-18 DIAGNOSIS — Z79899 Other long term (current) drug therapy: Secondary | ICD-10-CM | POA: Diagnosis not present

## 2015-11-18 DIAGNOSIS — F1010 Alcohol abuse, uncomplicated: Secondary | ICD-10-CM

## 2015-11-18 DIAGNOSIS — F1097 Alcohol use, unspecified with alcohol-induced persisting dementia: Secondary | ICD-10-CM

## 2015-11-18 DIAGNOSIS — E876 Hypokalemia: Secondary | ICD-10-CM | POA: Insufficient documentation

## 2015-11-18 DIAGNOSIS — Z791 Long term (current) use of non-steroidal anti-inflammatories (NSAID): Secondary | ICD-10-CM | POA: Insufficient documentation

## 2015-11-18 DIAGNOSIS — F17210 Nicotine dependence, cigarettes, uncomplicated: Secondary | ICD-10-CM | POA: Insufficient documentation

## 2015-11-18 DIAGNOSIS — F1019 Alcohol abuse with unspecified alcohol-induced disorder: Secondary | ICD-10-CM | POA: Diagnosis not present

## 2015-11-18 DIAGNOSIS — F03918 Unspecified dementia, unspecified severity, with other behavioral disturbance: Secondary | ICD-10-CM | POA: Diagnosis present

## 2015-11-18 DIAGNOSIS — Z046 Encounter for general psychiatric examination, requested by authority: Secondary | ICD-10-CM | POA: Diagnosis present

## 2015-11-18 HISTORY — DX: Chronic obstructive pulmonary disease, unspecified: J44.9

## 2015-11-18 LAB — COMPREHENSIVE METABOLIC PANEL
ALBUMIN: 3 g/dL — AB (ref 3.5–5.0)
ALT: 53 U/L (ref 17–63)
ANION GAP: 7 (ref 5–15)
AST: 125 U/L — AB (ref 15–41)
Alkaline Phosphatase: 96 U/L (ref 38–126)
BUN: 10 mg/dL (ref 6–20)
CHLORIDE: 105 mmol/L (ref 101–111)
CO2: 22 mmol/L (ref 22–32)
Calcium: 8.6 mg/dL — ABNORMAL LOW (ref 8.9–10.3)
Creatinine, Ser: 0.86 mg/dL (ref 0.61–1.24)
GFR calc Af Amer: >60 mL/min (ref >60)
GFR calc non Af Amer: >60 mL/min (ref >60)
GLUCOSE: 105 mg/dL — AB (ref 65–99)
POTASSIUM: 3.2 mmol/L — AB (ref 3.5–5.1)
SODIUM: 134 mmol/L — AB (ref 135–145)
TOTAL PROTEIN: 6.7 g/dL (ref 6.5–8.1)
Total Bilirubin: 0.5 mg/dL (ref 0.3–1.2)

## 2015-11-18 LAB — AMMONIA: Ammonia: 32 umol/L (ref 9–35)

## 2015-11-18 LAB — URINE DRUG SCREEN, QUALITATIVE (ARMC ONLY)
Amphetamines, Ur Screen: NOT DETECTED
Barbiturates, Ur Screen: NOT DETECTED
Benzodiazepine, Ur Scrn: NOT DETECTED
CANNABINOID 50 NG, UR ~~LOC~~: NOT DETECTED
COCAINE METABOLITE, UR ~~LOC~~: NOT DETECTED
MDMA (ECSTASY) UR SCREEN: NOT DETECTED
Methadone Scn, Ur: NOT DETECTED
OPIATE, UR SCREEN: NOT DETECTED
Phencyclidine (PCP) Ur S: NOT DETECTED
TRICYCLIC, UR SCREEN: NOT DETECTED

## 2015-11-18 LAB — CBC
HEMATOCRIT: 35 % — AB (ref 40.0–52.0)
HEMOGLOBIN: 12.1 g/dL — AB (ref 13.0–18.0)
MCH: 33.4 pg (ref 26.0–34.0)
MCHC: 34.5 g/dL (ref 32.0–36.0)
MCV: 96.9 fL (ref 80.0–100.0)
Platelets: 179 10*3/uL (ref 150–440)
RBC: 3.61 MIL/uL — AB (ref 4.40–5.90)
RDW: 14.1 % (ref 11.5–14.5)
WBC: 7 10*3/uL (ref 3.8–10.6)

## 2015-11-18 LAB — SALICYLATE LEVEL: Salicylate Lvl: <4 mg/dL (ref 2.8–30.0)

## 2015-11-18 LAB — ACETAMINOPHEN LEVEL

## 2015-11-18 LAB — ETHANOL
Alcohol, Ethyl (B): 93 mg/dL — ABNORMAL HIGH (ref <5)
Alcohol, Ethyl (B): 78 mg/dL — ABNORMAL HIGH (ref <5)

## 2015-11-18 MED ORDER — TRAZODONE HCL 150 MG PO TABS: 150.0000 mg | ORAL_TABLET | Freq: Every evening | ORAL | 0 refills | Status: DC | PRN

## 2015-11-18 NOTE — ED Notes (Signed)
Jeani Hawking from social work spoke with group home leader Leonard brown and told her the patient is being d/c and they have to accept him back. Cindy asked Jeani Hawking to call her supervisor Jerrye Noble and let him know. Jeani Hawking called [facility] and left a message since he did not answer. PD arrived to take patient home since group home refused to come pick up patient.

## 2015-11-18 NOTE — ED Triage Notes (Signed)
Pt brought into the ED via [employer] officer under IVC with c/o from group home that the pt is cutting out the screen in door and leaving the home at 2am, pt also has been burning sticks in the neighbors yard.. Officer today states pt was found in [place] store drinking alcohol  Pt denies burning sticks in neighbors yard , states he left the home just to go for a walk.Janice Norrie at group home [phone]

## 2015-11-18 NOTE — ED Notes (Addendum)
This RN called lynn with our social work and she reported that they cannot refuse to take patient back and she will be calling Cindy brown from group home.

## 2015-11-18 NOTE — ED Notes (Signed)
Spoke with Philis Nettle who is the caregiver at the home. She reports she cannot pick him up because she is working. Gave this RN Cindy's number [phone]

## 2015-11-18 NOTE — Progress Notes (Signed)
CSW called Janice Norrie (group home director) [phone]. CSW stated that pt has been medically and psychiatrically cleared and will be returning to the facility. Jenny Reichmann expressed her understanding and requested for CSW to call her supervisor to explain why pt is not being admitted to the hospital. CSW agreed.  CSW called Lamount Leath [phone] (Cindy's supervisor). No answer, CSW left a message and explained that pt would be returning to the facility shortly. CSW left contact information for Mr. Dia Sitter should he have additional questions.  Georga Kaufmann, MSW, [place]

## 2015-11-18 NOTE — Consult Note (Signed)
[place] [facility] Consult   Reason for Consult:  Consult for 70 year old man with a history of alcohol abuse and dementia who came into the hospital on IVC after being picked up in public using alcohol. Referring Physician:  Eual Fines Patient Identification: [name] MRN:  [id number] Principal Diagnosis: Dementia with behavioral disturbance Diagnosis:   Patient Active Problem List   Diagnosis Date Noted  . Alcohol abuse [F10.10] 11/18/2015  . Dementia with behavioral disturbance [F03.91] 10/21/2015    Total Time spent with patient: 45 minutes  Subjective:   [name] is a 70 y.o. male patient admitted with "I don't know they just brought me here".  HPI:  Patient interviewed. Chart reviewed. Patient known from previous hospital stay. Labs reviewed. 70 year old man was picked up by police and brought to the hospital after his group home became concerned about him. Reportedly he cut the screen out of the screen door so that he could leave the group home. He went to a Geophysicist/field seism[employer] store and was drinking beer. Patient admits that he was at the convenience store drinking beer. Denies that he damaged a door. Doesn't see anything particularly wrong about his behavior. Denies depression denies suicidal thoughts denies homicidal thoughts. Denies any new medical problems. Has some chronic trouble sleeping.  Social history: Has been living in a group home although they're having trouble keeping him there. Daughter is his closest relative and I suspect she may be at least his power of attorney.  Medical history: Remarkably good health given his age and his heavy drinking. Does have a diagnosis of some degree of cirrhosis although his ammonia level today was unremarkable.  Substance abuse history: Long-standing probably lifelong alcohol abuse. No history of seizures or delirium tremens  Past Psychiatric History: No history of psychiatric problems other than alcohol abuse. No history  of suicide attempts. No history of violence.  Risk to Self: Is patient at risk for suicide?: No Risk to Others:   Prior Inpatient Therapy:   Prior Outpatient Therapy:    Past Medical History:  Past Medical History:  Diagnosis Date  . Alcohol abuse   . Cirrhosis ([place])   . COPD (chronic obstructive pulmonary disease) ([place])    History reviewed. No pertinent surgical history. Family History: No family history on file. Family Psychiatric  History: Patient does not know of any family history of mental health or substance abuse problems Social History:  History  Alcohol Use  . Yes     History  Drug use: Unknown    Social History   Social History  . Marital status: Divorced    Spouse name: N/A  . Number of children: N/A  . Years of education: N/A   Social History Main Topics  . Smoking status: Current Every Day Smoker    Packs/day: 0.50    Types: Cigarettes  . Smokeless tobacco: Never Used  . Alcohol use Yes  . Drug use: Unknown  . Sexual activity: Not Asked   Other Topics Concern  . None   Social History Narrative  . None   Additional Social History:    Allergies:   Allergies  Allergen Reactions  . Penicillins Other (See Comments)    unknown    Labs:  Results for orders placed or performed during the hospital encounter of 11/18/15 (from the past 48 hour(s))  Comprehensive metabolic panel     Status: Abnormal   Collection Time: 11/18/15  1:52 PM  Result Value Ref Range   Sodium 134 (L) 135 -  145 mmol/L   Potassium 3.2 (L) 3.5 - 5.1 mmol/L   Chloride 105 101 - 111 mmol/L   CO2 22 22 - 32 mmol/L   Glucose, Bld 105 (H) 65 - 99 mg/dL   BUN 10 6 - 20 mg/dL   Creatinine, Ser 0.86 0.61 - 1.24 mg/dL   Calcium 8.6 (L) 8.9 - 10.3 mg/dL   Total Protein 6.7 6.5 - 8.1 g/dL   Albumin 3.0 (L) 3.5 - 5.0 g/dL   AST 125 (H) 15 - 41 U/L   ALT 53 17 - 63 U/L   Alkaline Phosphatase 96 38 - 126 U/L   Total Bilirubin 0.5 0.3 - 1.2 mg/dL   GFR calc non Af Amer >60 >60  mL/min   GFR calc Af Amer >60 >60 mL/min    Comment: (NOTE) The eGFR has been calculated using the CKD EPI equation. This calculation has not been validated in all clinical situations. eGFR's persistently <60 mL/min signify possible Chronic Kidney Disease.    Anion gap 7 5 - 15  Ethanol     Status: Abnormal   Collection Time: 11/18/15  1:52 PM  Result Value Ref Range   Alcohol, Ethyl (B) 93 (H) <5 mg/dL    Comment:        LOWEST DETECTABLE LIMIT FOR SERUM ALCOHOL IS 5 mg/dL FOR MEDICAL PURPOSES ONLY   cbc     Status: Abnormal   Collection Time: 11/18/15  1:52 PM  Result Value Ref Range   WBC 7.0 3.8 - 10.6 K/uL   RBC 3.61 (L) 4.40 - 5.90 MIL/uL   Hemoglobin 12.1 (L) 13.0 - 18.0 g/dL   HCT 35.0 (L) 40.0 - 52.0 %   MCV 96.9 80.0 - 100.0 fL   MCH 33.4 26.0 - 34.0 pg   MCHC 34.5 32.0 - 36.0 g/dL   RDW 14.1 11.5 - 14.5 %   Platelets 179 150 - 440 K/uL  Urine Drug Screen, Qualitative     Status: None   Collection Time: 11/18/15  1:52 PM  Result Value Ref Range   Tricyclic, Ur Screen NONE DETECTED NONE DETECTED   Amphetamines, Ur Screen NONE DETECTED NONE DETECTED   MDMA (Ecstasy)Ur Screen NONE DETECTED NONE DETECTED   Cocaine Metabolite,Ur [place] NONE DETECTED NONE DETECTED   Opiate, Ur Screen NONE DETECTED NONE DETECTED   Phencyclidine (PCP) Ur S NONE DETECTED NONE DETECTED   Cannabinoid 50 Ng, Ur [place] NONE DETECTED NONE DETECTED   Barbiturates, Ur Screen NONE DETECTED NONE DETECTED   Benzodiazepine, Ur Scrn NONE DETECTED NONE DETECTED   Methadone Scn, Ur NONE DETECTED NONE DETECTED    Comment: (NOTE) 751  Tricyclics, urine               Cutoff 1000 ng/mL 200  Amphetamines, urine             Cutoff 1000 ng/mL 300  MDMA (Ecstasy), urine           Cutoff 500 ng/mL 400  Cocaine Metabolite, urine       Cutoff 300 ng/mL 500  Opiate, urine                   Cutoff 300 ng/mL 600  Phencyclidine (PCP), urine      Cutoff 25 ng/mL 700  Cannabinoid, urine              Cutoff 50 ng/mL 800   Barbiturates, urine             Cutoff  200 ng/mL 900  Benzodiazepine, urine           Cutoff 200 ng/mL 1000 Methadone, urine                Cutoff 300 ng/mL 1100 1200 The urine drug screen provides only a preliminary, unconfirmed 1300 analytical test result and should not be used for non-medical 1400 purposes. Clinical consideration and professional judgment should 1500 be applied to any positive drug screen result due to possible 1600 interfering substances. A more specific alternate chemical method 1700 must be used in order to obtain a confirmed analytical result.  1800 Gas chromato graphy / mass spectrometry (GC/MS) is the preferred 1900 confirmatory method.   Salicylate level     Status: None   Collection Time: 11/18/15  1:52 PM  Result Value Ref Range   Salicylate Lvl <8.1 2.8 - 30.0 mg/dL  Acetaminophen level     Status: Abnormal   Collection Time: 11/18/15  1:52 PM  Result Value Ref Range   Acetaminophen (Tylenol), Serum <10 (L) 10 - 30 ug/mL    Comment:        THERAPEUTIC CONCENTRATIONS VARY SIGNIFICANTLY. A RANGE OF 10-30 ug/mL MAY BE AN EFFECTIVE CONCENTRATION FOR MANY PATIENTS. HOWEVER, SOME ARE BEST TREATED AT CONCENTRATIONS OUTSIDE THIS RANGE. ACETAMINOPHEN CONCENTRATIONS >150 ug/mL AT 4 HOURS AFTER INGESTION AND >50 ug/mL AT 12 HOURS AFTER INGESTION ARE OFTEN ASSOCIATED WITH TOXIC REACTIONS.   Ammonia     Status: None   Collection Time: 11/18/15  2:45 PM  Result Value Ref Range   Ammonia 32 9 - 35 umol/L  Ethanol     Status: Abnormal   Collection Time: 11/18/15  2:45 PM  Result Value Ref Range   Alcohol, Ethyl (B) 78 (H) <5 mg/dL    Comment:        LOWEST DETECTABLE LIMIT FOR SERUM ALCOHOL IS 5 mg/dL FOR MEDICAL PURPOSES ONLY     No current facility-administered medications for this encounter.    Current Outpatient Prescriptions  Medication Sig Dispense Refill  . ADVAIR DISKUS 250-50 MCG/DOSE AEPB Inhale 2 puffs into the lungs daily.    Marland Kitchen  albuterol (PROVENTIL HFA;VENTOLIN HFA) 108 (90 Base) MCG/ACT inhaler Inhale 2 puffs into the lungs every 6 (six) hours as needed for wheezing or shortness of breath.    Marland Kitchen alendronate (FOSAMAX) 70 MG tablet Take 70 mg by mouth once a week.     . cholecalciferol (VITAMIN D) 1000 units tablet Take 5,000 Units by mouth daily.    . folic acid (FOLVITE) 1 MG tablet Take 1 mg by mouth daily.    . furosemide (LASIX) 20 MG tablet Take 20 mg by mouth daily.     Marland Kitchen lactulose (CHRONULAC) 10 GM/15ML solution Take 20 g by mouth 3 (three) times daily.    . Ledipasvir-Sofosbuvir (HARVONI) 90-400 MG TABS Take 1 tablet by mouth daily. 12 weeks,    . LORazepam (ATIVAN) 0.5 MG tablet Take 1 mg by mouth at bedtime.     . magnesium oxide (MAG-OX) 400 MG tablet Take 800 mg by mouth 3 (three) times daily.     . Melatonin 3 MG TABS Take 6 mg by mouth at bedtime.    . meloxicam (MOBIC) 7.5 MG tablet Take 7.5 mg by mouth daily.     . Multiple Vitamins-Minerals (MULTIVITAMIN WITH MINERALS) tablet Take 1 tablet by mouth daily.    . nadolol (CORGARD) 20 MG tablet Take 10 mg by mouth 2 (two) times daily.     Marland Kitchen  potassium chloride (K-DUR) 10 MEQ tablet Take 20 mEq by mouth daily.     Marland Kitchen SPIRIVA HANDIHALER 18 MCG inhalation capsule Place 18 mcg into inhaler and inhale daily.     Marland Kitchen spironolactone (ALDACTONE) 50 MG tablet Take 50 mg by mouth daily.     . tamsulosin (FLOMAX) 0.4 MG CAPS capsule Take 0.4 mg by mouth 2 (two) times daily.     Marland Kitchen thiamine 100 MG tablet Take 100 mg by mouth daily.      Musculoskeletal: Strength & Muscle Tone: decreased Gait & Station: broad based Patient leans: N/A  Psychiatric Specialty Exam: Physical Exam  Nursing note and vitals reviewed. Constitutional: He appears well-developed.  HENT:  Head: Normocephalic and atraumatic.  Eyes: Conjunctivae are normal. Pupils are equal, round, and reactive to light.  Neck: Normal range of motion.  Cardiovascular: Regular rhythm and normal heart sounds.    Respiratory: Effort normal. No respiratory distress.  GI: Soft.  Musculoskeletal: Normal range of motion.  Neurological: He is alert.  Skin: Skin is warm and dry.  Psychiatric: He has a normal mood and affect. His speech is normal and behavior is normal. Judgment normal. Thought content is not paranoid. He expresses no homicidal and no suicidal ideation. He exhibits abnormal recent memory and abnormal remote memory.    Review of Systems  Constitutional: Negative.   HENT: Negative.   Eyes: Negative.   Respiratory: Negative.   Cardiovascular: Negative.   Gastrointestinal: Negative.   Musculoskeletal: Negative.   Skin: Negative.   Neurological: Negative.   Psychiatric/Behavioral: Positive for substance abuse. Negative for depression, hallucinations, memory loss and suicidal ideas. The patient has insomnia. The patient is not nervous/anxious.     Blood pressure (!) 122/55, pulse 79, resp. rate 16, SpO2 99 %.There is no height or weight on file to calculate BMI.  General Appearance: Fairly Groomed  Eye Contact:  Fair  Speech:  Clear and Coherent  Volume:  Decreased  Mood:  Euthymic  Affect:  Congruent  Thought Process:  Goal Directed  Orientation:  Full (Time, Place, and Person)  Thought Content:  Logical  Suicidal Thoughts:  No  Homicidal Thoughts:  No  Memory:  Immediate;   Fair Recent;   Poor Remote;   Fair  Judgement:  Impaired  Insight:  Shallow  Psychomotor Activity:  Decreased  Concentration:  Concentration: Fair  Recall:  [employer] of Knowledge:  Fair  Language:  Fair  Akathisia:  No  Handed:  Right  AIMS (if indicated):     Assets:  Housing Physical Health Resilience Social Support  ADL's:  Intact  Cognition:  Impaired,  Mild  Sleep:        Treatment Plan Summary: Plan 70 year old man with mild dementia and ongoing alcohol problems. Doesn't seem to have any intention of staying in the group home if he can possibly get out to buy himself some beer.  Pleasant gentleman who has not been violent or attempting to harm himself. Does not meet commitment criteria. Discontinue IVC. No need for any particular medication. Did a little bit of counseling with him about why he keeps being brought into the hospital. Case reviewed with TTS and ER physician.  Disposition: Patient does not meet criteria for psychiatric inpatient admission. Supportive therapy provided about ongoing stressors.  Alethia Berthold, MD 11/18/2015 3:46 PM

## 2015-11-18 NOTE — ED Notes (Signed)
[name] states [name] cannot come back to facility due to open guardianship case going on that started Friday. Milana Kidney social worker [phone]

## 2015-11-18 NOTE — Discharge Instructions (Signed)
If you desire to stop drinking alcohol, please talk directly to your primary care physician for safe detoxification.  Please also have your primary care doctor recheck your potassium.  Return to the emergency department for thoughts of hurting yourself or anyone else, hallucinations, seizures, or any other symptoms concerning to you.

## 2015-11-18 NOTE — ED Provider Notes (Signed)
[facility] Emergency Department Provider Note  ____________________________________________  Time seen: Approximately 2:09 PM  I have reviewed the triage vital signs and the nursing notes.   HISTORY  Chief Complaint Psychiatric Evaluation   HPI [name] is a 70 y.o. male history of dementia, alcohol abuse, cirrhosis, COPD who was brought in IVC by police from his group home. According to paperwork patient has been escaping from his group home. He cut a hole in the screen of his door and left 2AM to drink. He was found burn matches in the neighbor's yard. He was then found by police in a convenience store drinking alcohol. He denies suicidal homicidal ideation. He does endorse running away from the group home because he wants drink alcohol. Reports had a beer earlier today. He denies chest pain, shortness of breath, abdominal pain, nausea, vomiting.  Past Medical History:  Diagnosis Date  . Alcohol abuse   . Cirrhosis ([place])   . COPD (chronic obstructive pulmonary disease) [facility])     Patient Active Problem List   Diagnosis Date Noted  . Alcohol abuse 11/18/2015  . Dementia with behavioral disturbance 10/21/2015    History reviewed. No pertinent surgical history.  Prior to Admission medications   Medication Sig Start Date End Date Taking? Authorizing Provider  ADVAIR DISKUS 250-50 MCG/DOSE AEPB Inhale 2 puffs into the lungs daily. 08/29/15   Historical Provider, MD  albuterol (PROVENTIL HFA;VENTOLIN HFA) 108 (90 Base) MCG/ACT inhaler Inhale 2 puffs into the lungs every 6 (six) hours as needed for wheezing or shortness of breath.    Historical Provider, MD  alendronate (FOSAMAX) 70 MG tablet Take 70 mg by mouth once a week.  09/24/15   Historical Provider, MD  cholecalciferol (VITAMIN D) 1000 units tablet Take 5,000 Units by mouth daily.    Historical Provider, MD  folic acid (FOLVITE) 1 MG tablet Take 1 mg by mouth daily.    Historical Provider, MD    furosemide (LASIX) 20 MG tablet Take 20 mg by mouth daily.  10/14/15   Historical Provider, MD  lactulose (CHRONULAC) 10 GM/15ML solution Take 20 g by mouth 3 (three) times daily.    Historical Provider, MD  Ledipasvir-Sofosbuvir (HARVONI) 90-400 MG TABS Take 1 tablet by mouth daily. 12 weeks,    Historical Provider, MD  LORazepam (ATIVAN) 0.5 MG tablet Take 1 mg by mouth at bedtime.  09/16/15   Historical Provider, MD  magnesium oxide (MAG-OX) 400 MG tablet Take 800 mg by mouth 3 (three) times daily.     Historical Provider, MD  Melatonin 3 MG TABS Take 6 mg by mouth at bedtime.    Historical Provider, MD  meloxicam (MOBIC) 7.5 MG tablet Take 7.5 mg by mouth daily.  10/14/15   Historical Provider, MD  Multiple Vitamins-Minerals (MULTIVITAMIN WITH MINERALS) tablet Take 1 tablet by mouth daily.    Historical Provider, MD  nadolol (CORGARD) 20 MG tablet Take 10 mg by mouth 2 (two) times daily.  09/16/15   Historical Provider, MD  potassium chloride (K-DUR) 10 MEQ tablet Take 20 mEq by mouth daily.  10/14/15   Historical Provider, MD  SPIRIVA HANDIHALER 18 MCG inhalation capsule Place 18 mcg into inhaler and inhale daily.  10/07/15   Historical Provider, MD  spironolactone (ALDACTONE) 50 MG tablet Take 50 mg by mouth daily.  10/14/15   Historical Provider, MD  tamsulosin (FLOMAX) 0.4 MG CAPS capsule Take 0.4 mg by mouth 2 (two) times daily.  10/14/15   Historical  Provider, MD  thiamine 100 MG tablet Take 100 mg by mouth daily.    Historical Provider, MD  traZODone (DESYREL) 150 MG tablet Take 1 tablet (150 mg total) by mouth at bedtime as needed for sleep. 11/18/15   Gonzella Lex, MD    Allergies Penicillins  No family history on file.  Social History Social History  Substance Use Topics  . Smoking status: Current Every Day Smoker    Packs/day: 0.50    Types: Cigarettes  . Smokeless tobacco: Never Used  . Alcohol use Yes    Review of Systems  Constitutional: Negative for fever. Eyes:  Negative for visual changes. ENT: Negative for sore throat. Cardiovascular: Negative for chest pain. Respiratory: Negative for shortness of breath. Gastrointestinal: Negative for abdominal pain, vomiting or diarrhea. Genitourinary: Negative for dysuria. Musculoskeletal: Negative for back pain. Skin: Negative for rash. Neurological: Negative for headaches, weakness or numbness.  ____________________________________________   PHYSICAL EXAM:  VITAL SIGNS: Vitals:   11/18/15 1536  BP: (!) 122/55  Pulse: 79  Resp: 16   Constitutional: Alert and oriented. Well appearing and in no apparent distress. HEENT:      Head: Normocephalic and atraumatic.         Eyes: Conjunctivae are normal. Sclera is non-icteric. EOMI. PERRL      Mouth/Throat: Mucous membranes are moist.       Neck: Supple with no signs of meningismus. Cardiovascular: Regular rate and rhythm. No murmurs, gallops, or rubs. 2+ symmetrical distal pulses are present in all extremities. No JVD. Respiratory: Normal respiratory effort. Lungs are clear to auscultation bilaterally. No wheezes, crackles, or rhonchi.  Gastrointestinal: Soft, non tender, and non distended with positive bowel sounds. No rebound or guarding. Musculoskeletal: Nontender with normal range of motion in all extremities. No edema, cyanosis, or erythema of extremities. Neurologic: Normal speech and language. Face is symmetric. Moving all extremities. No gross focal neurologic deficits are appreciated. Skin: Skin is warm, dry and intact. No rash noted. Psychiatric: Mood and affect are normal. Speech and behavior are normal.  ____________________________________________   LABS (all labs ordered are listed, but only abnormal results are displayed)  Labs Reviewed  COMPREHENSIVE METABOLIC PANEL - Abnormal; Notable for the following:       Result Value   Sodium 134 (*)    Potassium 3.2 (*)    Glucose, Bld 105 (*)    Calcium 8.6 (*)    Albumin 3.0 (*)    AST  125 (*)    All other components within normal limits  ETHANOL - Abnormal; Notable for the following:    Alcohol, Ethyl (B) 93 (*)    All other components within normal limits  CBC - Abnormal; Notable for the following:    RBC 3.61 (*)    Hemoglobin 12.1 (*)    HCT 35.0 (*)    All other components within normal limits  ACETAMINOPHEN LEVEL - Abnormal; Notable for the following:    Acetaminophen (Tylenol), Serum <10 (*)    All other components within normal limits  ETHANOL - Abnormal; Notable for the following:    Alcohol, Ethyl (B) 78 (*)    All other components within normal limits  URINE DRUG SCREEN, QUALITATIVE ([facility] ONLY)  SALICYLATE LEVEL  AMMONIA  URINALYSIS COMPLETEWITH MICROSCOPIC ([facility] ONLY)   ____________________________________________  EKG  none  ____________________________________________  RADIOLOGY  none  ____________________________________________   PROCEDURES  Procedure(s) performed: None Procedures Critical Care performed:  None ____________________________________________   INITIAL IMPRESSION / ASSESSMENT AND PLAN /  ED COURSE  70 y.o. male history of dementia, alcohol abuse, cirrhosis, COPD who was brought in IVC by police from his group home for running away to drink alcohol. Patient denies SI or HI and has no medical complaints at this time. Plan for medical clearance and evaluation by psychiatry.   Clinical Course   Labs WNL, patient evaluated by psychiatry and cleared for DC back to group home. Patient transported by police back to group home.  Pertinent labs & imaging results that were available during my care of the patient were reviewed by me and considered in my medical decision making (see chart for details).    ____________________________________________   FINAL CLINICAL IMPRESSION(S) / ED DIAGNOSES  Final diagnoses:  Alcohol abuse  Hypokalemia      NEW MEDICATIONS STARTED DURING THIS VISIT:  Discharge Medication List  as of 11/18/2015  3:16 PM       Note:  This document was prepared using Dragon voice recognition software and may include unintentional dictation errors.    Rudene Re, MD 11/18/15 [phone]

## 2016-01-15 ENCOUNTER — Other Ambulatory Visit: Payer: Self-pay | Admitting: Unknown Physician Specialty

## 2016-01-15 DIAGNOSIS — R221 Localized swelling, mass and lump, neck: Secondary | ICD-10-CM

## 2016-01-22 ENCOUNTER — Ambulatory Visit
Admission: RE | Admit: 2016-01-22 | Discharge: 2016-01-22 | Disposition: A | Discharge status: Home / Self Care | Payer: Medicare Other | Source: Ambulatory Visit | Attending: Unknown Physician Specialty | Admitting: Unknown Physician Specialty

## 2016-01-22 DIAGNOSIS — R221 Localized swelling, mass and lump, neck: Secondary | ICD-10-CM | POA: Diagnosis present

## 2016-01-22 DIAGNOSIS — J320 Chronic maxillary sinusitis: Secondary | ICD-10-CM | POA: Insufficient documentation

## 2016-01-22 DIAGNOSIS — J439 Emphysema, unspecified: Secondary | ICD-10-CM | POA: Insufficient documentation

## 2016-01-22 LAB — POCT I-STAT CREATININE: Creatinine, Ser: 0.7 mg/dL (ref 0.61–1.24)

## 2016-01-22 MED ORDER — IOPAMIDOL (ISOVUE-300) INJECTION 61%
75.0000 mL | Freq: Once | INTRAVENOUS | Status: AC | PRN
  Administered 2016-01-22: 75 mL via INTRAVENOUS

## 2016-02-07 ENCOUNTER — Other Ambulatory Visit: Payer: Self-pay | Admitting: Unknown Physician Specialty

## 2016-02-07 DIAGNOSIS — C770 Secondary and unspecified malignant neoplasm of lymph nodes of head, face and neck: Secondary | ICD-10-CM

## 2016-02-19 ENCOUNTER — Encounter
Admission: RE | Admit: 2016-02-19 | Discharge: 2016-02-19 | Disposition: A | Discharge status: Home / Self Care | Payer: Medicare Other | Source: Ambulatory Visit | Attending: Unknown Physician Specialty | Admitting: Unknown Physician Specialty

## 2016-02-19 DIAGNOSIS — C329 Malignant neoplasm of larynx, unspecified: Secondary | ICD-10-CM | POA: Insufficient documentation

## 2016-02-19 DIAGNOSIS — C770 Secondary and unspecified malignant neoplasm of lymph nodes of head, face and neck: Secondary | ICD-10-CM | POA: Insufficient documentation

## 2016-02-19 NOTE — Progress Notes (Deleted)
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 Otherwise, a complete review of systems is negative.  PAST MEDICAL HISTORY: Past Medical History:  Diagnosis Date  . Alcohol abuse   . Cirrhosis ([place])   . COPD (chronic obstructive pulmonary disease) ([place])     PAST SURGICAL HISTORY: No past surgical history on file.  FAMILY HISTORY: No family history on file.  ADVANCED DIRECTIVES (Y/N):  N  HEALTH MAINTENANCE: Social History  Substance Use Topics  . Smoking status: Current Every Day Smoker    Packs/day: 0.50    Types: Cigarettes  . Smokeless tobacco: Never Used  . Alcohol use Yes     Colonoscopy:  PAP:  Bone density:  Lipid panel:  Allergies  Allergen Reactions  . Penicillins Other (See Comments)    unknown    Current Outpatient Prescriptions  Medication Sig Dispense Refill  . ADVAIR DISKUS 250-50 MCG/DOSE AEPB Inhale 2 puffs into the lungs daily.    Marland Kitchen albuterol (PROVENTIL HFA;VENTOLIN HFA) 108 (90 Base) MCG/ACT inhaler Inhale 2 puffs into the lungs every 6 (six) hours as needed for wheezing or shortness of breath.    Marland Kitchen alendronate (FOSAMAX) 70 MG tablet Take 70 mg by mouth once a week.     . cholecalciferol (VITAMIN D) 1000 units tablet Take 5,000 Units by mouth daily.    . folic acid (FOLVITE) 1 MG tablet Take 1 mg by mouth daily.    . furosemide (LASIX) 20 MG tablet Take 20 mg by mouth daily.     Marland Kitchen lactulose (CHRONULAC) 10 GM/15ML solution Take 20 g by mouth 3 (three) times daily.    . Ledipasvir-Sofosbuvir (HARVONI) 90-400 MG TABS Take 1 tablet by mouth daily. 12 weeks,    . LORazepam (ATIVAN) 0.5 MG tablet Take 1 mg by mouth at bedtime.     . magnesium oxide  (MAG-OX) 400 MG tablet Take 800 mg by mouth 3 (three) times daily.     . Melatonin 3 MG TABS Take 6 mg by mouth at bedtime.    . meloxicam (MOBIC) 7.5 MG tablet Take 7.5 mg by mouth daily.     . Multiple Vitamins-Minerals (MULTIVITAMIN WITH MINERALS) tablet Take 1 tablet by mouth daily.    . nadolol (CORGARD) 20 MG tablet Take 10 mg by mouth 2 (two) times daily.     . potassium chloride (K-DUR) 10 MEQ tablet Take 20 mEq by mouth daily.     Marland Kitchen SPIRIVA HANDIHALER 18 MCG inhalation capsule Place 18 mcg into inhaler and inhale daily.     Marland Kitchen spironolactone (ALDACTONE) 50 MG tablet Take 50 mg by mouth daily.     . tamsulosin (FLOMAX) 0.4 MG CAPS capsule Take 0.4 mg by mouth 2 (two) times daily.     Marland Kitchen thiamine 100 MG tablet Take 100 mg by mouth daily.    . traZODone (DESYREL) 150 MG tablet Take 1 tablet (150 mg total) by mouth at bedtime as needed for sleep. 30 tablet 0   No current facility-administered medications for this visit.     OBJECTIVE: There were no vitals filed for this visit.   There is no height or weight on file to calculate BMI.  ECOG FS:{CHL ONC [id number]  General: Well-developed, well-nourished, no acute distress. Eyes: Pink conjunctiva, anicteric sclera. HEENT: Normocephalic, moist mucous membranes, clear oropharnyx. Lungs: Clear to auscultation bilaterally. Heart: Regular rate and rhythm. No rubs, murmurs, or gallops. Abdomen: Soft, nontender, nondistended. No organomegaly noted, normoactive bowel sounds. Musculoskeletal: No edema, cyanosis, or clubbing. Neuro: Alert, answering all questions appropriately. Cranial nerves grossly intact. Skin: No rashes or petechiae noted. Psych: Normal affect. Lymphatics: No cervical, calvicular, axillary or inguinal LAD.   LAB RESULTS:  Lab Results  Component Value Date   NA 134 (L) 11/18/2015   K 3.2 (L) 11/18/2015   CL 105 11/18/2015   CO2 22 11/18/2015   GLUCOSE 105 (H) 11/18/2015   BUN 10 11/18/2015   CREATININE 0.70  01/22/2016   CALCIUM 8.6 (L) 11/18/2015   PROT 6.7 11/18/2015   ALBUMIN 3.0 (L) 11/18/2015   AST 125 (H) 11/18/2015   ALT 53 11/18/2015   ALKPHOS 96 11/18/2015   BILITOT 0.5 11/18/2015   GFRNONAA >60 11/18/2015   GFRAA >60 11/18/2015    Lab Results  Component Value Date   WBC 7.0 11/18/2015   NEUTROABS 5.2 11/15/2015   HGB 12.1 (L) 11/18/2015   HCT 35.0 (L) 11/18/2015   MCV 96.9 11/18/2015   PLT 179 11/18/2015     STUDIES: Ct Soft Tissue Neck W Contrast  Result Date: 01/22/2016 CLINICAL DATA:  Initial evaluation for right-sided neck swelling for 6 months. EXAM: CT NECK WITH CONTRAST TECHNIQUE: Multidetector CT imaging of the neck was performed using the standard protocol following the bolus administration of intravenous contrast. CONTRAST:  76m ISOVUE-300 IOPAMIDOL (ISOVUE-300) INJECTION 61% COMPARISON:  None available. FINDINGS: Pharynx and larynx: Oral cavity within normal limits without mass lesion or loculated fluid collection. Patient is edentulous. Mild asymmetric prominence of the right palatine tonsil as compared to the left (Series 2, image 24), indeterminate. Parapharyngeal fat is preserved. Nasopharynx within normal limits. Retropharyngeal soft tissues within normal limits. Epiglottis normal. Vallecula clear. Piriform sinuses are largely effaced, and not well evaluated. There is an ill-defined mass involving the right supraglottic/glottic larynx. Lesion is centered near the right false cords and right laryngeal ventricle, with asymmetric fullness seen within this region (series 2, image 62). There is infiltration of the paraglottic adjacent fat. Exact measurements fairly difficult given the infiltrative nature of this lesion. Lesion suspected to cross the midline along its posterior aspect. Asymmetric reactive sclerosis within the right thyroid cartilage concerning for disease involvement (series 2, image 69). The right arytenoid cartilage demonstrates asymmetric sclerosis as  well, and may also be involved. Cricoid within normal limits. Subglottic airway is clear inferiorly. Salivary glands: Parotid and submandibular glands within normal limits. Thyroid: Thyroid within normal limits. Lymph nodes: Necrotic nodal conglomerate centered at right level III measures approximately 3.6 x 2.3 x 5.2 cm, concerning for nodal metastases (series 2, image 66). This is positioned deep to the right sternocleidomastoid muscle. This nodal conglomerate is intimately associated with the right carotid artery system posteriorly. Additional small 7 mm node noted at the inferior margin of this nodal conglomerate (series 5, image 68). No other definite adenopathy. Vascular: Prominent atheromatous plaque present within the aortic arch in at the proximal great vessels. Extensive atheromatous plaque about the carotid bifurcations as well, right worse than left. Limited intracranial: Negative. Visualized orbits: Globes orbits not well evaluated on this exam. Mastoids and visualized paranasal sinuses: Chronic paranasal sinus disease involving the maxillary sinuses with associated osseous dehiscence. Visualized mastoids are clear. Middle ear cavities are  clear. Skeleton: No acute osseous abnormality. Remote posttraumatic deformity noted at the right sternoclavicular junction. No worrisome lytic or blastic osseous lesions. Moderate degenerative spondylolysis noted at C4-5 through C6-7. Upper chest: Visualized upper mediastinum within normal limits. Centrilobular and paraseptal emphysema present. IMPRESSION: 1. Ill-defined mass centered at the right supraglottic/glottic larynx as above, consistent with primary head and neck carcinoma. 2. 3.6 x 2.3 x 5.2 cm necrotic right level III nodal conglomerate, compatible with nodal metastases. 3. Emphysema. 4. Chronic maxillary sinusitis. Electronically Signed   By: Jeannine Boga M.D.   On: 01/22/2016 15:45    ASSESSMENT:  Squamous cell carcinoma of the right  larynx.  PLAN:    1. 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 He also understands that He can call clinic at any time with any questions, concerns, or complaints.   No matching staging information was found for the patient.  Lloyd Huger, MD   02/19/2016 11:21 PM

## 2016-02-20 ENCOUNTER — Inpatient Hospital Stay: Payer: Medicare Other | Admitting: Oncology

## 2016-02-20 ENCOUNTER — Other Ambulatory Visit: Payer: Self-pay | Admitting: *Deleted

## 2016-02-20 DIAGNOSIS — C329 Malignant neoplasm of larynx, unspecified: Secondary | ICD-10-CM

## 2016-02-24 ENCOUNTER — Other Ambulatory Visit: Payer: Self-pay | Admitting: *Deleted

## 2016-02-24 DIAGNOSIS — C329 Malignant neoplasm of larynx, unspecified: Secondary | ICD-10-CM

## 2016-02-26 NOTE — Progress Notes (Signed)
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 By report, he is also a ward of the state and cannot consent for himself.  Patient reports been there for several months. Subsequent biopsy and imaging revealed the above stated malignancy. Review of systems is difficult, but patient offers no specific complaints. He has no neurologic complaints. He denies any recent fevers or illnesses. He denies any dysphasia. He has a fair appetite, but denies weight loss. He has no chest pain or shortness of breath. He denies any nausea, vomiting, constipation, or diarrhea. He has no urinary complaints. Patient offers no further specific complaints.  REVIEW OF SYSTEMS:   Review of Systems  Constitutional: Negative.  Negative for fever, malaise/fatigue and weight loss.  Respiratory: Negative.  Negative for cough and shortness of breath.   Cardiovascular: Negative.  Negative for chest pain and leg swelling.  Gastrointestinal: Negative.  Negative for abdominal pain.  Genitourinary: Negative.   Musculoskeletal: Negative.   Neurological: Negative.  Negative for sensory change and weakness.  Psychiatric/Behavioral: Positive for substance abuse.    As per HPI. Otherwise, a complete review of systems is negative.  PAST MEDICAL HISTORY: Past Medical History:  Diagnosis Date  . Alcohol abuse   . Cirrhosis ([place])   . COPD (chronic obstructive pulmonary disease) ([place])     PAST SURGICAL HISTORY: Past Surgical History:  Procedure Laterality Date  . TONSILLECTOMY     age was 78  . VASECTOMY       FAMILY HISTORY: Family History  Problem Relation Age of Onset  . Breast cancer Mother     ADVANCED DIRECTIVES (Y/N):  N  HEALTH MAINTENANCE: Social History  Substance Use Topics  . Smoking status: Current Every Day Smoker    Packs/day: 0.50    Types: Cigarettes  . Smokeless tobacco: Never Used  . Alcohol use Yes     Colonoscopy:  PAP:  Bone density:  Lipid panel:  Allergies  Allergen Reactions  . Penicillins Other (See Comments)    unknown    Current Outpatient Prescriptions  Medication Sig Dispense Refill  . ADVAIR DISKUS 250-50 MCG/DOSE AEPB Inhale 2 puffs into the lungs daily.    Marland Kitchen albuterol (PROVENTIL HFA;VENTOLIN HFA) 108 (90 Base) MCG/ACT inhaler Inhale 2 puffs into the lungs every 6 (six) hours as needed for wheezing or shortness of breath.    Marland Kitchen alendronate (FOSAMAX) 70 MG tablet Take 70 mg by mouth once a week.     . cholecalciferol (VITAMIN D) 1000 units tablet Take 5,000 Units by mouth daily.    . folic acid (FOLVITE) 1 MG tablet Take 1 mg by mouth daily.    . furosemide (LASIX) 20 MG tablet Take 20 mg by mouth daily.     Marland Kitchen lactulose (CHRONULAC) 10 GM/15ML solution Take 20 g by mouth 3 (three) times daily.    . Ledipasvir-Sofosbuvir (HARVONI) 90-400 MG TABS Take 1 tablet by mouth daily. 12 weeks,    . LORazepam (ATIVAN) 0.5 MG tablet Take 1 mg by mouth at bedtime.     . magnesium oxide (MAG-OX) 400 MG tablet Take 800 mg by mouth  3 (three) times daily.     . Melatonin 3 MG TABS Take 6 mg by mouth at bedtime.    . meloxicam (MOBIC) 7.5 MG tablet Take 7.5 mg by mouth daily.     . Multiple Vitamins-Minerals (MULTIVITAMIN WITH MINERALS) tablet Take 1 tablet by mouth daily.    . nadolol (CORGARD) 20 MG tablet Take 10 mg by mouth 2 (two) times daily.     . potassium chloride (K-DUR) 10 MEQ tablet Take 20 mEq by mouth daily.     . QUEtiapine (SEROQUEL) 50 MG tablet     . SPIRIVA HANDIHALER 18 MCG inhalation capsule Place 18 mcg into inhaler and inhale daily.      Marland Kitchen spironolactone (ALDACTONE) 50 MG tablet Take 50 mg by mouth daily.     . tamsulosin (FLOMAX) 0.4 MG CAPS capsule Take 0.4 mg by mouth 2 (two) times daily.     Marland Kitchen thiamine 100 MG tablet Take 100 mg by mouth daily.    . traZODone (DESYREL) 150 MG tablet Take 1 tablet (150 mg total) by mouth at bedtime as needed for sleep. 30 tablet 0   No current facility-administered medications for this visit.     OBJECTIVE: Vitals:   02/27/16 1608  BP: 118/76  Pulse: 80  Resp: 18  Temp: 98.4 F (36.9 C)     Body mass index is 19.41 kg/m.    ECOG FS:0 - Asymptomatic  General: Well-developed, well-nourished, no acute distress. Eyes: Pink conjunctiva, anicteric sclera. HEENT: Normocephalic, moist mucous membranes, clear oropharnyx. Easily palpable lymphadenopathy in right cervical chain. Lungs: Clear to auscultation bilaterally. Heart: Regular rate and rhythm. No rubs, murmurs, or gallops. Abdomen: Soft, nontender, nondistended. No organomegaly noted, normoactive bowel sounds. Musculoskeletal: No edema, cyanosis, or clubbing. Neuro: Alert, answering all questions appropriately. Cranial nerves grossly intact. Skin: No rashes or petechiae noted. Psych: Normal affect.   LAB RESULTS:  Lab Results  Component Value Date   NA 134 (L) 11/18/2015   K 3.2 (L) 11/18/2015   CL 105 11/18/2015   CO2 22 11/18/2015   GLUCOSE 105 (H) 11/18/2015   BUN 10 11/18/2015   CREATININE 0.70 01/22/2016   CALCIUM 8.6 (L) 11/18/2015   PROT 6.7 11/18/2015   ALBUMIN 3.0 (L) 11/18/2015   AST 125 (H) 11/18/2015   ALT 53 11/18/2015   ALKPHOS 96 11/18/2015   BILITOT 0.5 11/18/2015   GFRNONAA >60 11/18/2015   GFRAA >60 11/18/2015    Lab Results  Component Value Date   WBC 7.0 11/18/2015   NEUTROABS 5.2 11/15/2015   HGB 12.1 (L) 11/18/2015   HCT 35.0 (L) 11/18/2015   MCV 96.9 11/18/2015   PLT 179 11/18/2015     STUDIES: Nm Pet Image Initial (pi) Skull Base To Thigh  Result Date: 02/27/2016 CLINICAL  DATA:  Initial treatment strategy for lymph node malignant neoplasm of the RIGHT neck. EXAM: NUCLEAR MEDICINE PET SKULL BASE TO THIGH TECHNIQUE: 11.9 mCi F-18 FDG was injected intravenously. Full-ring PET imaging was performed from the skull base to thigh after the radiotracer. CT data was obtained and used for attenuation correction and anatomic localization. FASTING BLOOD GLUCOSE:  Value: 114 mg/dl COMPARISON:  Neck CT 01/22/2016 FINDINGS: NECK Intense hypermetabolic activity in the parapharyngeal supraglottic tissue just above the hyoid bone with SUV max equal 19.4. Large hypermetabolic necrotic RIGHT level 2 lymph node measures 21 mm short axis with SUV max equal 11.6. This appears to be a conglomeration of lymph nodes measuring 5 cm and craniocaudad dimension.  No contralateral hypermetabolic lymph nodes. CHEST No hypermetabolic mediastinal or hilar nodes. No suspicious pulmonary nodules on the CT scan. ABDOMEN/PELVIS No abnormal hypermetabolic activity within the liver, pancreas, adrenal glands, or spleen. No hypermetabolic lymph nodes in the abdomen or pelvis. SKELETON No focal hypermetabolic activity to suggest skeletal metastasis. Activity at the medial RIGHT clavicle related to remote trauma. IMPRESSION: 1. Intensely hypermetabolic RIGHT supraglottic mass consistent primary head neck carcinoma. 2. Bulky conglomeration of intensely hypermetabolic necrotic RIGHT level II lymph nodes. 3. No contralateral hypermetabolic lymph nodes. 4. No distant metastatic disease evident. Electronically Signed   By: Suzy Bouchard M.D.   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  PLAN:    1. 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 Given patient's history of noncompliance, will not pursue induction chemotherapy and proceed with concurrent radiation  therapy and weekly chemotherapy using cisplatin. A referral was given to radiation oncology for further evaluation and treatment planning. Patient will return to clinic in approximately 2 weeks to initiate cycle 1 of weekly cisplatin. 2. Social situation: A representative from patient's care home was with him today, but she could not consent on his behalf. They awere instructed to ensure that his power of attorney is present at his next visit so that patient can legally consent for both chemotherapy and XRT.  Approximately 45 minutes was spent in discussion of which greater than 50% was consultation.  Patient expressed understanding and was in agreement with this plan. He also understands that He can call clinic at any time with any questions, concerns, or complaints.   Primary cancer of larynx [facility])   Staging form: Larynx - Supraglottis, AJCC 8th Edition   - Clinical stage from 02/27/2016: Stage IVA (cT1, cN2b, cM0) - Signed by Lloyd Huger, MD on 02/27/2016  Lloyd Huger, MD   03/01/2016 9:55 AM

## 2016-02-27 ENCOUNTER — Encounter: Payer: Self-pay | Admitting: Oncology

## 2016-02-27 ENCOUNTER — Ambulatory Visit
Admission: RE | Admit: 2016-02-27 | Discharge: 2016-02-27 | Disposition: A | Discharge status: Home / Self Care | Payer: Medicare Other | Source: Ambulatory Visit | Attending: Unknown Physician Specialty | Admitting: Unknown Physician Specialty

## 2016-02-27 ENCOUNTER — Inpatient Hospital Stay: Payer: Medicare Other | Attending: Oncology | Admitting: Oncology

## 2016-02-27 VITALS — BP 118/76 | HR 80 | Temp 98.4°F | Resp 18 | Ht 68.0 in | Wt 127.6 lb

## 2016-02-27 DIAGNOSIS — C770 Secondary and unspecified malignant neoplasm of lymph nodes of head, face and neck: Secondary | ICD-10-CM | POA: Diagnosis present

## 2016-02-27 DIAGNOSIS — F1020 Alcohol dependence, uncomplicated: Secondary | ICD-10-CM

## 2016-02-27 DIAGNOSIS — Z803 Family history of malignant neoplasm of breast: Secondary | ICD-10-CM | POA: Diagnosis not present

## 2016-02-27 DIAGNOSIS — Z79899 Other long term (current) drug therapy: Secondary | ICD-10-CM | POA: Diagnosis not present

## 2016-02-27 DIAGNOSIS — L040 Acute lymphadenitis of face, head and neck: Secondary | ICD-10-CM | POA: Insufficient documentation

## 2016-02-27 DIAGNOSIS — F17210 Nicotine dependence, cigarettes, uncomplicated: Secondary | ICD-10-CM

## 2016-02-27 DIAGNOSIS — Z9119 Patient's noncompliance with other medical treatment and regimen: Secondary | ICD-10-CM

## 2016-02-27 DIAGNOSIS — Z608 Other problems related to social environment: Secondary | ICD-10-CM

## 2016-02-27 DIAGNOSIS — J449 Chronic obstructive pulmonary disease, unspecified: Secondary | ICD-10-CM

## 2016-02-27 DIAGNOSIS — Z7189 Other specified counseling: Secondary | ICD-10-CM

## 2016-02-27 DIAGNOSIS — Z880 Allergy status to penicillin: Secondary | ICD-10-CM

## 2016-02-27 DIAGNOSIS — C329 Malignant neoplasm of larynx, unspecified: Secondary | ICD-10-CM | POA: Diagnosis present

## 2016-02-27 DIAGNOSIS — K7030 Alcoholic cirrhosis of liver without ascites: Secondary | ICD-10-CM

## 2016-02-27 LAB — GLUCOSE, CAPILLARY: GLUCOSE-CAPILLARY: 114 mg/dL — AB (ref 65–99)

## 2016-02-27 MED ORDER — FLUDEOXYGLUCOSE F - 18 (FDG) INJECTION
12.0000 | Freq: Once | INTRAVENOUS | Status: AC | PRN
  Administered 2016-02-27: 11.85 via INTRAVENOUS

## 2016-03-01 DIAGNOSIS — Z7189 Other specified counseling: Secondary | ICD-10-CM | POA: Insufficient documentation

## 2016-03-01 NOTE — Progress Notes (Signed)
START ON PATHWAY REGIMEN - Head and Neck  [id number]: Cisplatin 40 mg/m2 Weekly with Concurrent Radiation for 6 - 7 Weeks   Administer weekly:     Cisplatin (Platinol(R)) 40 mg/m2 in 500 mL NS IV over 2 hours. *Prehydrate and consider post-hydration.* Dose Mod: None  **Always confirm dose/schedule in your pharmacy ordering system**    Patient Characteristics: Larynx, Stage III, IVA; Resectable, Primary Chemoradiation Disease Classification: Larynx AJCC N Stage: X AJCC T Stage: X Current Disease Status: No Distant Mets or Local Recurrence AJCC Stage Grouping: IVA AJCC M Stage: X  Intent of Therapy: Curative Intent, Discussed with Patient

## 2016-03-03 NOTE — Patient Instructions (Signed)
Cisplatin injection What is this medicine? CISPLATIN (SIS pla tin) is a chemotherapy drug. It targets fast dividing cells, like cancer cells, and causes these cells to die. This medicine is used to treat many types of cancer like bladder, ovarian, and testicular cancers. This medicine may be used for other purposes; ask your health care provider or pharmacist if you have questions. COMMON BRAND NAME(S): Platinol, Platinol -AQ What should I tell my health care provider before I take this medicine? They need to know if you have any of these conditions: -blood disorders -hearing problems -kidney disease -recent or ongoing radiation therapy -an unusual or allergic reaction to cisplatin, carboplatin, other chemotherapy, other medicines, foods, dyes, or preservatives -pregnant or trying to get pregnant -breast-feeding How should I use this medicine? This drug is given as an infusion into a vein. It is administered in a hospital or clinic by a specially trained health care professional. Talk to your pediatrician regarding the use of this medicine in children. Special care may be needed. Overdosage: If you think you have taken too much of this medicine contact a poison control center or emergency room at once. NOTE: This medicine is only for you. Do not share this medicine with others. What if I miss a dose? It is important not to miss a dose. Call your doctor or health care professional if you are unable to keep an appointment. What may interact with this medicine? -dofetilide -foscarnet -medicines for seizures -medicines to increase blood counts like filgrastim, pegfilgrastim, sargramostim -probenecid -pyridoxine used with altretamine -rituximab -some antibiotics like amikacin, gentamicin, neomycin, polymyxin B, streptomycin, tobramycin -sulfinpyrazone -vaccines -zalcitabine Talk to your doctor or health care professional before taking any of these  medicines: -acetaminophen -aspirin -ibuprofen -ketoprofen -naproxen This list may not describe all possible interactions. Give your health care provider a list of all the medicines, herbs, non-prescription drugs, or dietary supplements you use. Also tell them if you smoke, drink alcohol, or use illegal drugs. Some items may interact with your medicine. What should I watch for while using this medicine? Your condition will be monitored carefully while you are receiving this medicine. You will need important blood work done while you are taking this medicine. This drug may make you feel generally unwell. This is not uncommon, as chemotherapy can affect healthy cells as well as cancer cells. Report any side effects. Continue your course of treatment even though you feel ill unless your doctor tells you to stop. In some cases, you may be given additional medicines to help with side effects. Follow all directions for their use. Call your doctor or health care professional for advice if you get a fever, chills or sore throat, or other symptoms of a cold or flu. Do not treat yourself. This drug decreases your body's ability to fight infections. Try to avoid being around people who are sick. This medicine may increase your risk to bruise or bleed. Call your doctor or health care professional if you notice any unusual bleeding. Be careful brushing and flossing your teeth or using a toothpick because you may get an infection or bleed more easily. If you have any dental work done, tell your dentist you are receiving this medicine. Avoid taking products that contain aspirin, acetaminophen, ibuprofen, naproxen, or ketoprofen unless instructed by your doctor. These medicines may hide a fever. Do not become pregnant while taking this medicine. Women should inform their doctor if they wish to become pregnant or think they might be pregnant. There is a   potential for serious side effects to an unborn child. Talk to  your health care professional or pharmacist for more information. Do not breast-feed an infant while taking this medicine. Drink fluids as directed while you are taking this medicine. This will help protect your kidneys. Call your doctor or health care professional if you get diarrhea. Do not treat yourself. What side effects may I notice from receiving this medicine? Side effects that you should report to your doctor or health care professional as soon as possible: -allergic reactions like skin rash, itching or hives, swelling of the face, lips, or tongue -signs of infection - fever or chills, cough, sore throat, pain or difficulty passing urine -signs of decreased platelets or bleeding - bruising, pinpoint red spots on the skin, black, tarry stools, nosebleeds -signs of decreased red blood cells - unusually weak or tired, fainting spells, lightheadedness -breathing problems -changes in hearing -gout pain -low blood counts - This drug may decrease the number of white blood cells, red blood cells and platelets. You may be at increased risk for infections and bleeding. -nausea and vomiting -pain, swelling, redness or irritation at the injection site -pain, tingling, numbness in the hands or feet -problems with balance, movement -trouble passing urine or change in the amount of urine Side effects that usually do not require medical attention (report to your doctor or health care professional if they continue or are bothersome): -changes in vision -loss of appetite -metallic taste in the mouth or changes in taste This list may not describe all possible side effects. Call your doctor for medical advice about side effects. You may report side effects to FDA at 1-800-FDA-1088. Where should I keep my medicine? This drug is given in a hospital or clinic and will not be stored at home. NOTE: This sheet is a summary. It may not cover all possible information. If you have questions about this medicine,  talk to your doctor, pharmacist, or health care provider.  2017 [employer]vier/Gold Standard (2007-05-10 14:40:54)

## 2016-03-05 ENCOUNTER — Inpatient Hospital Stay: Payer: Medicare Other

## 2016-03-05 ENCOUNTER — Ambulatory Visit: Payer: Medicare Other | Admitting: Radiation Oncology

## 2016-03-12 ENCOUNTER — Inpatient Hospital Stay: Payer: Medicare Other

## 2016-03-12 ENCOUNTER — Encounter: Payer: Self-pay | Admitting: Radiation Oncology

## 2016-03-12 ENCOUNTER — Ambulatory Visit
Admission: RE | Admit: 2016-03-12 | Discharge: 2016-03-12 | Disposition: A | Discharge status: Home / Self Care | Payer: Medicare Other | Source: Ambulatory Visit | Attending: Radiation Oncology | Admitting: Radiation Oncology

## 2016-03-12 VITALS — BP 115/67 | HR 73 | Temp 96.3°F | Resp 20 | Wt 130.5 lb

## 2016-03-12 DIAGNOSIS — Z79899 Other long term (current) drug therapy: Secondary | ICD-10-CM | POA: Insufficient documentation

## 2016-03-12 DIAGNOSIS — Z510 Encounter for antineoplastic radiation therapy: Secondary | ICD-10-CM | POA: Diagnosis not present

## 2016-03-12 DIAGNOSIS — F17210 Nicotine dependence, cigarettes, uncomplicated: Secondary | ICD-10-CM | POA: Diagnosis not present

## 2016-03-12 DIAGNOSIS — J449 Chronic obstructive pulmonary disease, unspecified: Secondary | ICD-10-CM | POA: Insufficient documentation

## 2016-03-12 DIAGNOSIS — K7460 Unspecified cirrhosis of liver: Secondary | ICD-10-CM | POA: Diagnosis not present

## 2016-03-12 DIAGNOSIS — C329 Malignant neoplasm of larynx, unspecified: Secondary | ICD-10-CM

## 2016-03-12 DIAGNOSIS — C321 Malignant neoplasm of supraglottis: Secondary | ICD-10-CM | POA: Insufficient documentation

## 2016-03-12 DIAGNOSIS — F1020 Alcohol dependence, uncomplicated: Secondary | ICD-10-CM | POA: Insufficient documentation

## 2016-03-12 NOTE — Consult Note (Signed)
NEW PATIENT EVALUATION  Name: [name]  MRN: [id number]  Date:   03/12/2016     DOB: [birth year]   This 71 y.o. 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  REFERRING PHYSICIAN: No ref. provider found  CHIEF COMPLAINT:  Chief Complaint  Patient presents with  . Cancer    Pt is here for initial consultation of head and neck cancer.      DIAGNOSIS: The encounter diagnosis was Malignant tumor of larynx ([place]).   PREVIOUS INVESTIGATIONS:  PET CT scan reviewed Pathology report reviewed Clinical notes reviewed  HPI: Patient is a 71 year old male with long-standing history of alcoholism and smoking who is a ward of the state presented with increasing right neck mass. Interestingly this was not associated with any difficulty swallowing or any head and neck pain. He underwent biopsy which showed squamous cell carcinoma. PET CT scan showed hypermetabolic right supraglottic mass consistent with primary head and neck cancer. He also had a conglomeration of hypermetabolic right level II lymph nodes. No other evidence of distant metastatic disease or left neck adenopathy was detected. He's been seen by medical oncology and is now referred to radiation oncology for consideration of treatment.  PLANNED TREATMENT REGIMEN: Concurrent chemoradiation  PAST MEDICAL HISTORY:  has a past medical history of Alcohol abuse; Cirrhosis ([place]); and COPD (chronic obstructive pulmonary disease) ([place]).    PAST SURGICAL HISTORY:  Past Surgical History:  Procedure Laterality Date  . TONSILLECTOMY     age was 21  . VASECTOMY      FAMILY HISTORY: family history includes Breast cancer in his mother.  SOCIAL HISTORY:  reports that he has been smoking Cigarettes.  He has been smoking about 0.50 packs per day. He has never used smokeless tobacco. He reports that he drinks alcohol. He reports that he does not use drugs.  ALLERGIES:  Penicillins  MEDICATIONS:  Current Outpatient Prescriptions  Medication Sig Dispense Refill  . ADVAIR DISKUS 250-50 MCG/DOSE AEPB Inhale 2 puffs into the lungs daily.    Marland Kitchen albuterol (PROVENTIL HFA;VENTOLIN HFA) 108 (90 Base) MCG/ACT inhaler Inhale 2 puffs into the lungs every 6 (six) hours as needed for wheezing or shortness of breath.    Marland Kitchen alendronate (FOSAMAX) 70 MG tablet Take 70 mg by mouth once a week.     . cholecalciferol (VITAMIN D) 1000 units tablet Take 5,000 Units by mouth daily.    Marland Kitchen donepezil (ARICEPT) 5 MG tablet     . folic acid (FOLVITE) 1 MG tablet Take 1 mg by mouth daily.    . furosemide (LASIX) 20 MG tablet Take 20 mg by mouth daily.     Marland Kitchen lactulose (CHRONULAC) 10 GM/15ML solution Take 20 g by mouth 3 (three) times daily.    Marland Kitchen LORazepam (ATIVAN) 0.5 MG tablet Take 1 mg by mouth at bedtime.     . magnesium oxide (MAG-OX) 400 MG tablet Take 800 mg by mouth 3 (three) times daily.     . Melatonin 3 MG TABS Take 6 mg by mouth at bedtime.    . meloxicam (MOBIC) 7.5 MG tablet Take 7.5 mg by mouth daily.     . Multiple Vitamins-Minerals (MULTIVITAMIN WITH MINERALS) tablet Take 1 tablet by mouth daily.    . nadolol (CORGARD) 20 MG tablet Take 10 mg by mouth 2 (two) times daily.     . potassium chloride (K-DUR) 10 MEQ tablet Take 20 mEq by mouth daily.     Marland Kitchen  QUEtiapine (SEROQUEL) 50 MG tablet     . SPIRIVA HANDIHALER 18 MCG inhalation capsule Place 18 mcg into inhaler and inhale daily.     Marland Kitchen spironolactone (ALDACTONE) 50 MG tablet Take 50 mg by mouth daily.     . tamsulosin (FLOMAX) 0.4 MG CAPS capsule Take 0.4 mg by mouth 2 (two) times daily.     Marland Kitchen thiamine 100 MG tablet Take 100 mg by mouth daily.    . traZODone (DESYREL) 150 MG tablet Take 1 tablet (150 mg total) by mouth at bedtime as needed for sleep. 30 tablet 0  . venlafaxine XR (EFFEXOR-XR) 150 MG 24 hr capsule     . Ledipasvir-Sofosbuvir (HARVONI) 90-400 MG TABS Take 1 tablet by mouth daily. 12 weeks,     No current  facility-administered medications for this encounter.     ECOG PERFORMANCE STATUS:  0 - Asymptomatic  REVIEW OF SYSTEMS:  Patient denies any weight loss, fatigue, weakness, fever, chills or night sweats. Patient denies any loss of vision, blurred vision. Patient denies any ringing  of the ears or hearing loss. No irregular heartbeat. Patient denies heart murmur or history of fainting. Patient denies any chest pain or pain radiating to her upper extremities. Patient denies any shortness of breath, difficulty breathing at night, cough or hemoptysis. Patient denies any swelling in the lower legs. Patient denies any nausea vomiting, vomiting of blood, or coffee ground material in the vomitus. Patient denies any stomach pain. Patient states has had normal bowel movements no significant constipation or diarrhea. Patient denies any dysuria, hematuria or significant nocturia. Patient denies any problems walking, swelling in the joints or loss of balance. Patient denies any skin changes, loss of hair or loss of weight. Patient denies any excessive worrying or anxiety or significant depression. Patient denies any problems with insomnia. Patient denies excessive thirst, polyuria, polydipsia. Patient denies any swollen glands, patient denies easy bruising or easy bleeding. Patient denies any recent infections, allergies or URI. Patient "s visual fields have not changed significantly in recent time.    PHYSICAL EXAM: BP 115/67   Pulse 73   Temp (!) 96.3 F (35.7 C)   Resp 20   Wt 130 lb 8.2 oz (59.2 kg)   BMI 19.84 kg/m  Oral cavity is clear patient is edentulous. He does have asymmetry on mirror examination in the right supra glottic pharynx. He has bulky adenopathy present in the right sub-digastric region. Left neck is clear no evidence of supraclavicular adenopathy is identified. Well-developed well-nourished patient in NAD. HEENT reveals PERLA, EOMI, discs not visualized.  Oral cavity is clear. No oral  mucosal lesions are identified. Neck is clear without evidence of cervical or supraclavicular adenopathy. Lungs are clear to A&P. Cardiac examination is essentially unremarkable with regular rate and rhythm without murmur rub or thrill. Abdomen is benign with no organomegaly or masses noted. Motor sensory and DTR levels are equal and symmetric in the upper and lower extremities. Cranial nerves II through XII are grossly intact. Proprioception is intact. No peripheral adenopathy or edema is identified. No motor or sensory levels are noted. Crude visual fields are within normal range.  LABORATORY DATA: Pathology reports have been requested for my review    RADIOLOGY RESULTS: CT scans and PET/CT scan reviewed   IMPRESSION: Stage for a squamous cell carcinoma the supraglottic larynx in 71 year old male with significant alcohol history as well as smoking history for concurrent chemoradiation with curative intent  PLAN: At this time like to go ahead  with radiation therapy with concurrent chemotherapy. I would use I MRT radiation therapy to treat up to 7000 cGy to the primary tumor. Would use PET/CT fusion study to delineate actual primary tumor involvement. I would also treat his remaining neck nodes to 5400 cGy again using IM RT dose painting capabilities.There will be extra effort by both professional staff as well as technical staff to coordinate and manage concurrent chemoradiation and ensuing side effects during his treatments. Risks and benefits of treatment including increased dysphasia oral mucositis alteration of taste fatigue skin reaction and alteration of blood counts all were discussed in detail with the patient. He seems to comprehend my treatment plan well. I person 7 ordered CT simulation for next week.  I would like to take this opportunity to thank you for allowing me to participate in the care of your patient.Armstead Peaks., MD

## 2016-03-16 ENCOUNTER — Ambulatory Visit
Admission: RE | Admit: 2016-03-16 | Discharge: 2016-03-16 | Disposition: A | Discharge status: Home / Self Care | Payer: Medicare Other | Source: Ambulatory Visit | Attending: Radiation Oncology | Admitting: Radiation Oncology

## 2016-03-16 DIAGNOSIS — Z510 Encounter for antineoplastic radiation therapy: Secondary | ICD-10-CM | POA: Diagnosis not present

## 2016-03-18 ENCOUNTER — Other Ambulatory Visit: Payer: Self-pay | Admitting: Oncology

## 2016-03-18 DIAGNOSIS — Z510 Encounter for antineoplastic radiation therapy: Secondary | ICD-10-CM | POA: Diagnosis not present

## 2016-03-18 DIAGNOSIS — C329 Malignant neoplasm of larynx, unspecified: Secondary | ICD-10-CM

## 2016-03-18 MED ORDER — PROCHLORPERAZINE MALEATE 10 MG PO TABS
10.0000 mg | ORAL_TABLET | Freq: Four times a day (QID) | ORAL | 1 refills | Status: DC | PRN
Start: 2016-03-18 — End: 2019-03-23

## 2016-03-18 MED ORDER — ONDANSETRON HCL 8 MG PO TABS: 8.0000 mg | ORAL_TABLET | Freq: Two times a day (BID) | ORAL | 1 refills | Status: DC | PRN

## 2016-03-25 ENCOUNTER — Ambulatory Visit
Admission: RE | Admit: 2016-03-25 | Discharge: 2016-03-25 | Disposition: A | Discharge status: Home / Self Care | Payer: Medicare Other | Source: Ambulatory Visit | Attending: Radiation Oncology | Admitting: Radiation Oncology

## 2016-03-25 NOTE — Progress Notes (Signed)
[facility]  Telephone:([phone] [phone]  ID: [name] OB: 05-Dec-1945  MR#: [id number]  DXA#:[id number]  Patient Care Team: No Pcp Per Patient as PCP - General (General Practice)  CHIEF COMPLAINT: Clinical stage IVa squamous cell carcinoma of the right larynx  INTERVAL HISTORY: Patient returns to clinic today for further evaluation and initiation of weekly cisplatin along with his daily XRT. By report, he is also a ward of the state and cannot consent for himself. He has difficulty sleeping, but otherwise feels well. He has no neurologic complaints. He denies any recent fevers or illnesses. He denies any dysphasia. He has a fair appetite, but denies weight loss. He has no chest pain or shortness of breath. He denies any nausea, vomiting, constipation, or diarrhea. He has no urinary complaints. Patient offers no further specific complaints.  REVIEW OF SYSTEMS:   Review of Systems  Constitutional: Negative.  Negative for fever, malaise/fatigue and weight loss.  Respiratory: Negative.  Negative for cough and shortness of breath.   Cardiovascular: Negative.  Negative for chest pain and leg swelling.  Gastrointestinal: Negative.  Negative for abdominal pain.  Genitourinary: Negative.   Musculoskeletal: Negative.   Neurological: Negative.  Negative for sensory change and weakness.  Psychiatric/Behavioral: Positive for substance abuse. The patient has insomnia.     As per HPI. Otherwise, a complete review of systems is negative.  PAST MEDICAL HISTORY: Past Medical History:  Diagnosis Date  . Alcohol abuse   . Cirrhosis ([place])   . COPD (chronic obstructive pulmonary disease) ([place])     PAST SURGICAL HISTORY: Past Surgical History:  Procedure Laterality Date  . TONSILLECTOMY     age was 24  . VASECTOMY      FAMILY HISTORY: Family History  Problem Relation Age of Onset  . Breast cancer Mother     ADVANCED DIRECTIVES (Y/N):  N  HEALTH  MAINTENANCE: Social History  Substance Use Topics  . Smoking status: Current Every Day Smoker    Packs/day: 0.50    Types: Cigarettes  . Smokeless tobacco: Never Used  . Alcohol use Yes     Colonoscopy:  PAP:  Bone density:  Lipid panel:  Allergies  Allergen Reactions  . Penicillins Other (See Comments)    unknown    Current Outpatient Prescriptions  Medication Sig Dispense Refill  . ADVAIR DISKUS 250-50 MCG/DOSE AEPB Inhale 2 puffs into the lungs daily.    . cholecalciferol (VITAMIN D) 1000 units tablet Take 5,000 Units by mouth daily.    Marland Kitchen donepezil (ARICEPT) 5 MG tablet     . folic acid (FOLVITE) 1 MG tablet Take 1 mg by mouth daily.    . furosemide (LASIX) 20 MG tablet Take 20 mg by mouth daily.     . magnesium oxide (MAG-OX) 400 MG tablet Take 800 mg by mouth 3 (three) times daily.     . meloxicam (MOBIC) 7.5 MG tablet Take 7.5 mg by mouth daily.     . nadolol (CORGARD) 20 MG tablet Take 10 mg by mouth daily.     . polyethylene glycol (MIRALAX / GLYCOLAX) packet Take 17 g by mouth daily.    . potassium chloride (K-DUR) 10 MEQ tablet Take 20 mEq by mouth daily.     . QUEtiapine (SEROQUEL) 50 MG tablet Take 100 mg by mouth at bedtime.     Marland Kitchen SPIRIVA HANDIHALER 18 MCG inhalation capsule Place 18 mcg into inhaler and inhale daily.     Marland Kitchen spironolactone (ALDACTONE) 50  MG tablet Take 50 mg by mouth daily.     . tamsulosin (FLOMAX) 0.4 MG CAPS capsule Take 0.4 mg by mouth 2 (two) times daily.     Marland Kitchen thiamine 100 MG tablet Take 100 mg by mouth daily.    . traZODone (DESYREL) 50 MG tablet Take 50 mg by mouth at bedtime.    Marland Kitchen venlafaxine XR (EFFEXOR-XR) 150 MG 24 hr capsule Take 150 mg by mouth.     . vitamin B-12 (CYANOCOBALAMIN) 1000 MCG tablet Take 1,000 mcg by mouth daily.    Marland Kitchen albuterol (PROVENTIL HFA;VENTOLIN HFA) 108 (90 Base) MCG/ACT inhaler Inhale 2 puffs into the lungs every 6 (six) hours as needed for wheezing or shortness of breath.    Marland Kitchen alendronate (FOSAMAX) 70 MG  tablet Take 70 mg by mouth once a week.     . lactulose (CHRONULAC) 10 GM/15ML solution Take 20 g by mouth 3 (three) times daily.    . Ledipasvir-Sofosbuvir (HARVONI) 90-400 MG TABS Take 1 tablet by mouth daily. 12 weeks,    . LORazepam (ATIVAN) 0.5 MG tablet Take 1 mg by mouth at bedtime.     . Melatonin 3 MG TABS Take 6 mg by mouth at bedtime.    . Multiple Vitamins-Minerals (MULTIVITAMIN WITH MINERALS) tablet Take 1 tablet by mouth daily.    . ondansetron (ZOFRAN) 8 MG tablet Take 1 tablet (8 mg total) by mouth 2 (two) times daily as needed. (Patient not taking: Reported on 03/26/2016) 30 tablet 1  . prochlorperazine (COMPAZINE) 10 MG tablet Take 1 tablet (10 mg total) by mouth every 6 (six) hours as needed (Nausea or vomiting). (Patient not taking: Reported on 03/26/2016) 30 tablet 1   No current facility-administered medications for this visit.     OBJECTIVE: Vitals:   03/26/16 0941  BP: 128/71  Pulse: 67  Resp: 18  Temp: 97.4 F (36.3 C)     Body mass index is 19.71 kg/m.    ECOG FS:0 - Asymptomatic  General: Well-developed, well-nourished, no acute distress. Eyes: Pink conjunctiva, anicteric sclera. HEENT: Normocephalic, moist mucous membranes, clear oropharnyx. Easily palpable lymphadenopathy in right cervical chain. Lungs: Clear to auscultation bilaterally. Heart: Regular rate and rhythm. No rubs, murmurs, or gallops. Abdomen: Soft, nontender, nondistended. No organomegaly noted, normoactive bowel sounds. Musculoskeletal: No edema, cyanosis, or clubbing. Neuro: Alert, answering all questions appropriately. Cranial nerves grossly intact. Skin: No rashes or petechiae noted. Psych: Normal affect.   LAB RESULTS:  Lab Results  Component Value Date   NA 132 (L) 03/26/2016   K 4.3 03/26/2016   CL 99 (L) 03/26/2016   CO2 27 03/26/2016   GLUCOSE 103 (H) 03/26/2016   BUN 16 03/26/2016   CREATININE 0.84 03/26/2016   CALCIUM 9.2 03/26/2016   PROT 6.7 11/18/2015   ALBUMIN 3.0  (L) 11/18/2015   AST 125 (H) 11/18/2015   ALT 53 11/18/2015   ALKPHOS 96 11/18/2015   BILITOT 0.5 11/18/2015   GFRNONAA >60 03/26/2016   GFRAA >60 03/26/2016    Lab Results  Component Value Date   WBC 7.5 03/26/2016   NEUTROABS 4.0 03/26/2016   HGB 13.3 03/26/2016   HCT 38.1 (L) 03/26/2016   MCV 96.2 03/26/2016   PLT 195 03/26/2016     STUDIES: No results found.  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  PLAN:    1. 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 Given patient's history of noncompliance, will not pursue induction chemotherapy and proceed with concurrent radiation therapy and weekly chemotherapy using cisplatin. Proceed with cycle 1 of weekly cisplatin. Return to clinic in 1 week for consideration of cycle 2.  2. Social situation: Because patient is a ward of the state, he will require his power of attorney to be present for any further consents. 3. Insomnia: Will consider Ambien in the future.  Approximately 30 minutes was spent in discussion of which greater than 50% was consultation.  Patient expressed understanding and was in agreement with this plan. He also understands that He can call clinic at any time with any questions, concerns, or complaints.   Cancer Staging Primary cancer of larynx [facility]) Staging form: Larynx - Supraglottis, AJCC 8th Edition - Clinical stage from 02/27/2016: Stage IVA (cT1, cN2b, cM0) - Signed by Lloyd Huger, MD on 02/27/2016   Lloyd Huger, MD   03/29/2016 6:32 PM

## 2016-03-26 ENCOUNTER — Ambulatory Visit
Admission: RE | Admit: 2016-03-26 | Discharge: 2016-03-26 | Disposition: A | Discharge status: Home / Self Care | Payer: Medicare Other | Source: Ambulatory Visit | Attending: Radiation Oncology | Admitting: Radiation Oncology

## 2016-03-26 ENCOUNTER — Inpatient Hospital Stay: Payer: Medicare Other

## 2016-03-26 ENCOUNTER — Inpatient Hospital Stay (HOSPITAL_BASED_OUTPATIENT_CLINIC_OR_DEPARTMENT_OTHER): Payer: Medicare Other | Admitting: Oncology

## 2016-03-26 ENCOUNTER — Inpatient Hospital Stay: Payer: Medicare Other | Attending: Oncology

## 2016-03-26 VITALS — BP 128/71 | HR 67 | Temp 97.4°F | Resp 18 | Wt 129.6 lb

## 2016-03-26 DIAGNOSIS — R590 Localized enlarged lymph nodes: Secondary | ICD-10-CM | POA: Insufficient documentation

## 2016-03-26 DIAGNOSIS — Z9119 Patient's noncompliance with other medical treatment and regimen: Secondary | ICD-10-CM

## 2016-03-26 DIAGNOSIS — C329 Malignant neoplasm of larynx, unspecified: Secondary | ICD-10-CM

## 2016-03-26 DIAGNOSIS — K7030 Alcoholic cirrhosis of liver without ascites: Secondary | ICD-10-CM

## 2016-03-26 DIAGNOSIS — C321 Malignant neoplasm of supraglottis: Secondary | ICD-10-CM

## 2016-03-26 DIAGNOSIS — F17210 Nicotine dependence, cigarettes, uncomplicated: Secondary | ICD-10-CM

## 2016-03-26 DIAGNOSIS — Z803 Family history of malignant neoplasm of breast: Secondary | ICD-10-CM

## 2016-03-26 DIAGNOSIS — Z5111 Encounter for antineoplastic chemotherapy: Secondary | ICD-10-CM | POA: Insufficient documentation

## 2016-03-26 DIAGNOSIS — J449 Chronic obstructive pulmonary disease, unspecified: Secondary | ICD-10-CM | POA: Diagnosis not present

## 2016-03-26 DIAGNOSIS — Z880 Allergy status to penicillin: Secondary | ICD-10-CM

## 2016-03-26 DIAGNOSIS — Z510 Encounter for antineoplastic radiation therapy: Secondary | ICD-10-CM | POA: Diagnosis not present

## 2016-03-26 DIAGNOSIS — Z79899 Other long term (current) drug therapy: Secondary | ICD-10-CM | POA: Insufficient documentation

## 2016-03-26 DIAGNOSIS — G4700 Insomnia, unspecified: Secondary | ICD-10-CM

## 2016-03-26 LAB — BASIC METABOLIC PANEL
ANION GAP: 6 (ref 5–15)
BUN: 16 mg/dL (ref 6–20)
CHLORIDE: 99 mmol/L — AB (ref 101–111)
CO2: 27 mmol/L (ref 22–32)
Calcium: 9.2 mg/dL (ref 8.9–10.3)
Creatinine, Ser: 0.84 mg/dL (ref 0.61–1.24)
Glucose, Bld: 103 mg/dL — ABNORMAL HIGH (ref 65–99)
POTASSIUM: 4.3 mmol/L (ref 3.5–5.1)
SODIUM: 132 mmol/L — AB (ref 135–145)

## 2016-03-26 LAB — CBC WITH DIFFERENTIAL/PLATELET
BASOS ABS: 0.1 10*3/uL (ref 0–0.1)
BASOS PCT: 1 %
EOS ABS: 0.1 10*3/uL (ref 0–0.7)
EOS PCT: 2 %
HCT: 38.1 % — ABNORMAL LOW (ref 40.0–52.0)
HEMOGLOBIN: 13.3 g/dL (ref 13.0–18.0)
LYMPHS ABS: 2.1 10*3/uL (ref 1.0–3.6)
Lymphocytes Relative: 28 %
MCH: 33.5 pg (ref 26.0–34.0)
MCHC: 34.8 g/dL (ref 32.0–36.0)
MCV: 96.2 fL (ref 80.0–100.0)
Monocytes Absolute: 1.2 10*3/uL — ABNORMAL HIGH (ref 0.2–1.0)
Monocytes Relative: 16 %
NEUTROS PCT: 53 %
Neutro Abs: 4 10*3/uL (ref 1.4–6.5)
PLATELETS: 195 10*3/uL (ref 150–440)
RBC: 3.96 MIL/uL — AB (ref 4.40–5.90)
RDW: 14 % (ref 11.5–14.5)
WBC: 7.5 10*3/uL (ref 3.8–10.6)

## 2016-03-26 MED ORDER — FOSAPREPITANT DIMEGLUMINE INJECTION 150 MG
Freq: Once | INTRAVENOUS | Status: AC
  Administered 2016-03-26: 13:00:00 via INTRAVENOUS
  Filled 2016-03-26: qty 5

## 2016-03-26 MED ORDER — SODIUM CHLORIDE 0.9 % IV SOLN
Freq: Once | INTRAVENOUS | Status: AC
  Administered 2016-03-26: 11:00:00 via INTRAVENOUS
  Filled 2016-03-26: qty 1000

## 2016-03-26 MED ORDER — PALONOSETRON HCL INJECTION 0.25 MG/5ML
0.2500 mg | Freq: Once | INTRAVENOUS | Status: AC
Start: 2016-03-26 — End: 2016-03-26
  Administered 2016-03-26: 0.25 mg via INTRAVENOUS
  Filled 2016-03-26: qty 5

## 2016-03-26 MED ORDER — POTASSIUM CHLORIDE 2 MEQ/ML IV SOLN
Freq: Once | INTRAVENOUS | Status: AC
  Administered 2016-03-26: 11:00:00 via INTRAVENOUS
  Filled 2016-03-26: qty 1000

## 2016-03-26 MED ORDER — SODIUM CHLORIDE 0.9 % IV SOLN
40.0000 mg/m2 | Freq: Once | INTRAVENOUS | Status: AC
  Administered 2016-03-26: 67 mg via INTRAVENOUS
  Filled 2016-03-26: qty 67

## 2016-03-26 MED ORDER — HEPARIN SOD (PORK) LOCK FLUSH 100 UNIT/ML IV SOLN: 500.0000 [IU] | Freq: Once | INTRAVENOUS | Status: DC | PRN

## 2016-03-26 NOTE — Progress Notes (Addendum)
Nutrition Assessment   Reason for Assessment:   Patient identified on Malnutrition Screening Report for weight loss  ASSESSMENT:  71 year old male with new diagnosis of stage IV squamous cell carcinoma of larynx (right supraglottic mass). Planning chemo and radiation. Past medical history of alcoholism, smoking, cirrhosis, COPD, dementia  Patient seen in infusion this am.  Patient currently living at [facility] per patient and is ward of the state.    Patient reports that he eats everything that "they give me". Reports typical day he eats 2 link sausages, scrambled eggs, sometimes has biscuit or toast and jelly too with coffee and juice.  Reports for lunch has hamburger helper. For supper patient reports "I don't know what we have."  Finally said sometimes we have grilled cheese sandwich.  Patient reports no problems swallowing (ask question in several different ways).  Patient reports is able to eat soft foods better because does not have any teeth. Reports that he wants to get dental implants.  When asked if care home prepares patient soft foods to eat patient reports yes.  Kept asking question regarding if foods still had nutrients in them if they were cooked too much (ie dry hamburger).   Hard to keep patient on track.  Started talking about how he has been unable to sleep and was prescribed medication but currently not getting it.  Wanting to know if this writer will be able to get him medication for sleep.  Called Tasha at [facility] and she reports patient has been eating well and cleans his plate and usually asks for more.  She reports at times will cut foods up for him due to lack of teeth.  She reports he has not described any problems swallowing foods.  Aniceto Boss reports when patient first came to care home about 8 months ago appetite was decreased for a few months but it has really picked up since then.  Aniceto Boss reports weight is 142 pounds and has been at that weight since  coming to the care home 8 months ago.    Nutrition Focused Physical Exam: Nutrition-Focused physical exam completed. Findings are severe biceps, under eye and rib area fat depletion, severe in temple area  muscle depletion (limited exam as patient wearing jeans and in infusion area) not examed  edema.    Medications: Vit D, folic acid, lasix, lactulose, mag ox, MVI, thiamine, zofran, compazine  Labs: Na 132, glucose 103  Anthropometrics:   Height: 68 inches Weight: 129 pounds today UBW: 150 pounds per patient.  Patient reports has lost 50 pounds in the last year.  Reports he has lost weight due to not being able to have his weight bench in his room and lift weights.  Wanted to know if this writer would be able to get the facility to let him keep his weight bench in his room.   Noted per chart wt of 145 pounds on 08/24/15.   Using weight of 145 pounds,  11% weight loss in the last 7 months, significant. BMI: 19   Estimated Energy Needs  Kcals: 1770-2000 calories/d Protein: 71-89 g/d Fluid: 2 L/d  NUTRITION DIAGNOSIS: Malnutrition related to cancer as evidenced by weight loss of 11% in the last 7 months and severe fat depletion and severe temple muscle mass loss.     MALNUTRITION DIAGNOSIS:  Patient meets criteria for severe malnutrition in chronic illness as evidenced by significant weight loss of 11% in the last 7 months and severe fat depletion  in all areas and severe muscle mass loss in temple region   INTERVENTION:   Discussed importance of good nutrition and consuming foods that are high in calories and protein.   Gave patient samples of boost plus and encouraged patient to drink to provide added nutrition.  Patient reports that his sister might be able to get him some boost plus but she does not come to visit him often. He said he would talk to her.   Patient would benefit from baseline SLP evaluation due to radiation being delivered. Discussed with MD Grayland Ormond and agreeable for  SLP evaluation. Recommend placement of PEG tube due to patient meeting criteria for severe malnutrition prior to starting treatment.  Discussed with MD Grayland Ormond and wanting to hold off at this time.   Per [facility] can give patient ensure/boost products with MD's request. MD agreeable and will fax prescription. Recommend boost plus TID between meals at this time. Discussed the importance of good nutrition and that care home should let this writer know if changes in appetite occur.    MONITORING, EVALUATION, GOAL: Patient will consume adequate calories and protein to prevent further weight loss   NEXT VISIT: Feb 15th during infusion  Aviella Disbrow B. Zenia Resides, [place], [place] (pager)

## 2016-03-27 ENCOUNTER — Ambulatory Visit
Admission: RE | Admit: 2016-03-27 | Discharge: 2016-03-27 | Disposition: A | Discharge status: Home / Self Care | Payer: Medicare Other | Source: Ambulatory Visit | Attending: Radiation Oncology | Admitting: Radiation Oncology

## 2016-03-27 ENCOUNTER — Telehealth: Payer: Self-pay | Admitting: *Deleted

## 2016-03-27 DIAGNOSIS — Z510 Encounter for antineoplastic radiation therapy: Secondary | ICD-10-CM | POA: Diagnosis not present

## 2016-03-27 NOTE — Telephone Encounter (Signed)
Order for boost was received but this would be an out of pocket expense for the patient and at this time cannot afford these expenses. [name] was questioning if our clinic has any programs that could assist the patient with getting boost or if we know of any programs outside of our clinic. Informed Liliana Cline will give his contact info to [facility] who will reach out to him to discuss these concerns with him. [name] verbalized understanding.

## 2016-03-30 ENCOUNTER — Ambulatory Visit
Admission: RE | Admit: 2016-03-30 | Discharge: 2016-03-30 | Disposition: A | Discharge status: Home / Self Care | Payer: Medicare Other | Source: Ambulatory Visit | Attending: Radiation Oncology | Admitting: Radiation Oncology

## 2016-03-30 DIAGNOSIS — Z510 Encounter for antineoplastic radiation therapy: Secondary | ICD-10-CM | POA: Diagnosis not present

## 2016-03-30 NOTE — Progress Notes (Unsigned)
PSN left a message with patient's caregiver, Serita Grit, asking that he call back to discuss financial assistance for boost.

## 2016-03-31 ENCOUNTER — Ambulatory Visit
Admission: RE | Admit: 2016-03-31 | Discharge: 2016-03-31 | Disposition: A | Discharge status: Home / Self Care | Payer: Medicare Other | Source: Ambulatory Visit | Attending: Radiation Oncology | Admitting: Radiation Oncology

## 2016-03-31 DIAGNOSIS — Z510 Encounter for antineoplastic radiation therapy: Secondary | ICD-10-CM | POA: Diagnosis not present

## 2016-04-01 ENCOUNTER — Ambulatory Visit: Admission: RE | Admit: 2016-04-01 | Payer: Medicare Other | Source: Ambulatory Visit

## 2016-04-01 NOTE — Progress Notes (Signed)
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 He tolerated his first treatment well without significant side effects. He continues to smoke heavily. He has no neurologic complaints. He denies any recent fevers or illnesses. He denies any dysphasia. He has a fair appetite, but denies weight loss. He has no chest pain or shortness of breath. He denies any nausea, vomiting, constipation, or diarrhea. He has no urinary complaints. Patient offers no further specific complaints.  REVIEW OF SYSTEMS:   Review of Systems  Constitutional: Negative.  Negative for fever, malaise/fatigue and weight loss.  Respiratory: Negative.  Negative for cough and shortness of breath.   Cardiovascular: Negative.  Negative for chest pain and leg swelling.  Gastrointestinal: Negative.  Negative for abdominal pain.  Genitourinary: Negative.   Musculoskeletal: Negative.   Neurological: Negative.  Negative for sensory change and weakness.  Psychiatric/Behavioral: Positive for substance abuse. The patient has insomnia.     As per HPI. Otherwise, a complete review of systems is negative.  PAST MEDICAL HISTORY: Past Medical History:  Diagnosis Date  . Alcohol abuse   . Cirrhosis ([place])   . COPD (chronic obstructive pulmonary disease) ([place])     PAST SURGICAL HISTORY: Past Surgical History:  Procedure Laterality Date  . TONSILLECTOMY     age was 30  . VASECTOMY      FAMILY HISTORY: Family History  Problem Relation Age of Onset  . Breast cancer Mother     ADVANCED DIRECTIVES (Y/N):  N  HEALTH MAINTENANCE: Social  History  Substance Use Topics  . Smoking status: Current Every Day Smoker    Packs/day: 0.50    Types: Cigarettes  . Smokeless tobacco: Never Used  . Alcohol use Yes     Colonoscopy:  PAP:  Bone density:  Lipid panel:  Allergies  Allergen Reactions  . Penicillins Other (See Comments)    unknown    Current Outpatient Prescriptions  Medication Sig Dispense Refill  . ADVAIR DISKUS 250-50 MCG/DOSE AEPB Inhale 2 puffs into the lungs daily.    Marland Kitchen albuterol (PROVENTIL HFA;VENTOLIN HFA) 108 (90 Base) MCG/ACT inhaler Inhale 2 puffs into the lungs every 6 (six) hours as needed for wheezing or shortness of breath.    Marland Kitchen alendronate (FOSAMAX) 70 MG tablet Take 70 mg by mouth once a week.     . cholecalciferol (VITAMIN D) 1000 units tablet Take 5,000 Units by mouth daily.    Marland Kitchen donepezil (ARICEPT) 5 MG tablet     . folic acid (FOLVITE) 1 MG tablet Take 1 mg by mouth daily.    . furosemide (LASIX) 20 MG tablet Take 20 mg by mouth daily.     Marland Kitchen lactulose (CHRONULAC) 10 GM/15ML solution Take 20 g by mouth 3 (three) times daily.    . Ledipasvir-Sofosbuvir (HARVONI) 90-400 MG TABS Take 1 tablet by mouth daily. 12 weeks,    . LORazepam (ATIVAN) 0.5 MG tablet Take 1 mg by mouth at bedtime.     . magnesium oxide (MAG-OX) 400 MG tablet Take 800 mg by mouth 3 (three) times daily.     . Melatonin 3 MG TABS Take 6 mg by mouth  at bedtime.    . meloxicam (MOBIC) 7.5 MG tablet Take 7.5 mg by mouth daily.     . Multiple Vitamins-Minerals (MULTIVITAMIN WITH MINERALS) tablet Take 1 tablet by mouth daily.    . nadolol (CORGARD) 20 MG tablet Take 10 mg by mouth daily.     . ondansetron (ZOFRAN) 8 MG tablet Take 1 tablet (8 mg total) by mouth 2 (two) times daily as needed. 30 tablet 1  . polyethylene glycol (MIRALAX / GLYCOLAX) packet Take 17 g by mouth daily.    . potassium chloride (K-DUR) 10 MEQ tablet Take 20 mEq by mouth daily.     . prochlorperazine (COMPAZINE) 10 MG tablet Take 1 tablet (10 mg total) by  mouth every 6 (six) hours as needed (Nausea or vomiting). 30 tablet 1  . QUEtiapine (SEROQUEL) 50 MG tablet Take 100 mg by mouth at bedtime.     Marland Kitchen SPIRIVA HANDIHALER 18 MCG inhalation capsule Place 18 mcg into inhaler and inhale daily.     Marland Kitchen spironolactone (ALDACTONE) 50 MG tablet Take 50 mg by mouth daily.     . tamsulosin (FLOMAX) 0.4 MG CAPS capsule Take 0.4 mg by mouth 2 (two) times daily.     Marland Kitchen thiamine 100 MG tablet Take 100 mg by mouth daily.    . traZODone (DESYREL) 50 MG tablet Take 50 mg by mouth at bedtime.    Marland Kitchen venlafaxine XR (EFFEXOR-XR) 150 MG 24 hr capsule Take 150 mg by mouth.     . vitamin B-12 (CYANOCOBALAMIN) 1000 MCG tablet Take 1,000 mcg by mouth daily.     No current facility-administered medications for this visit.     OBJECTIVE: Vitals:   04/02/16 0940  BP: 110/65  Pulse: 60  Temp: 97.6 F (36.4 C)     Body mass index is 20.53 kg/m.    ECOG FS:0 - Asymptomatic  General: Well-developed, well-nourished, no acute distress. Eyes: Pink conjunctiva, anicteric sclera. HEENT: Normocephalic, moist mucous membranes, clear oropharnyx. Easily palpable lymphadenopathy in right cervical chain. Lungs: Clear to auscultation bilaterally. Heart: Regular rate and rhythm. No rubs, murmurs, or gallops. Abdomen: Soft, nontender, nondistended. No organomegaly noted, normoactive bowel sounds. Musculoskeletal: No edema, cyanosis, or clubbing. Neuro: Alert, answering all questions appropriately. Cranial nerves grossly intact. Skin: No rashes or petechiae noted. Psych: Normal affect.   LAB RESULTS:  Lab Results  Component Value Date   NA 129 (L) 04/02/2016   K 4.1 04/02/2016   CL 99 (L) 04/02/2016   CO2 26 04/02/2016   GLUCOSE 99 04/02/2016   BUN 24 (H) 04/02/2016   CREATININE 0.93 04/02/2016   CALCIUM 9.0 04/02/2016   PROT 6.7 11/18/2015   ALBUMIN 3.0 (L) 11/18/2015   AST 125 (H) 11/18/2015   ALT 53 11/18/2015   ALKPHOS 96 11/18/2015   BILITOT 0.5 11/18/2015    GFRNONAA >60 04/02/2016   GFRAA >60 04/02/2016    Lab Results  Component Value Date   WBC 6.7 04/02/2016   NEUTROABS 4.1 04/02/2016   HGB 12.7 (L) 04/02/2016   HCT 36.0 (L) 04/02/2016   MCV 95.9 04/02/2016   PLT 213 04/02/2016     STUDIES: No results found.  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  PLAN:    1. 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 Given patient's history of noncompliance, will not pursue induction chemotherapy and proceed with concurrent radiation therapy and weekly chemotherapy  using cisplatin. Proceed with cycle 2 of weekly cisplatin. Return to clinic in 1 week for consideration of cycle 3.  2. Social situation: Because patient is a ward of the state, he will require his power of attorney to be present for any further consents. 3. Smoking cessation: Although nausea and the risks, patient has declined to discontinue tobacco at this time. 4. Dietary: Patient has an appointment with dietary consult on April 09, 2017.   Approximately 30 minutes was spent in discussion of which greater than 50% was consultation.  Patient expressed understanding and was in agreement with this plan. He also understands that He can call clinic at any time with any questions, concerns, or complaints.   Cancer Staging Primary cancer of larynx [facility]) Staging form: Larynx - Supraglottis, AJCC 8th Edition - Clinical stage from 02/27/2016: Stage IVA (cT1, cN2b, cM0) - Signed by Lloyd Huger, MD on 02/27/2016   Lloyd Huger, MD   04/04/2016 11:49 AM

## 2016-04-02 ENCOUNTER — Inpatient Hospital Stay (HOSPITAL_BASED_OUTPATIENT_CLINIC_OR_DEPARTMENT_OTHER): Payer: Medicare Other | Admitting: Oncology

## 2016-04-02 ENCOUNTER — Ambulatory Visit
Admission: RE | Admit: 2016-04-02 | Discharge: 2016-04-02 | Disposition: A | Discharge status: Home / Self Care | Payer: Medicare Other | Source: Ambulatory Visit | Attending: Radiation Oncology | Admitting: Radiation Oncology

## 2016-04-02 ENCOUNTER — Inpatient Hospital Stay: Payer: Medicare Other

## 2016-04-02 ENCOUNTER — Other Ambulatory Visit: Payer: Self-pay

## 2016-04-02 VITALS — BP 110/65 | HR 60 | Temp 97.6°F | Ht 66.0 in | Wt 127.2 lb

## 2016-04-02 DIAGNOSIS — R590 Localized enlarged lymph nodes: Secondary | ICD-10-CM | POA: Diagnosis not present

## 2016-04-02 DIAGNOSIS — Z880 Allergy status to penicillin: Secondary | ICD-10-CM

## 2016-04-02 DIAGNOSIS — C321 Malignant neoplasm of supraglottis: Secondary | ICD-10-CM

## 2016-04-02 DIAGNOSIS — Z510 Encounter for antineoplastic radiation therapy: Secondary | ICD-10-CM | POA: Diagnosis not present

## 2016-04-02 DIAGNOSIS — Z79899 Other long term (current) drug therapy: Secondary | ICD-10-CM

## 2016-04-02 DIAGNOSIS — F17210 Nicotine dependence, cigarettes, uncomplicated: Secondary | ICD-10-CM

## 2016-04-02 DIAGNOSIS — C329 Malignant neoplasm of larynx, unspecified: Secondary | ICD-10-CM

## 2016-04-02 DIAGNOSIS — Z5111 Encounter for antineoplastic chemotherapy: Secondary | ICD-10-CM | POA: Diagnosis not present

## 2016-04-02 DIAGNOSIS — J449 Chronic obstructive pulmonary disease, unspecified: Secondary | ICD-10-CM | POA: Diagnosis not present

## 2016-04-02 DIAGNOSIS — G4700 Insomnia, unspecified: Secondary | ICD-10-CM | POA: Diagnosis not present

## 2016-04-02 DIAGNOSIS — K7030 Alcoholic cirrhosis of liver without ascites: Secondary | ICD-10-CM | POA: Diagnosis not present

## 2016-04-02 LAB — BASIC METABOLIC PANEL
ANION GAP: 4 — AB (ref 5–15)
BUN: 24 mg/dL — ABNORMAL HIGH (ref 6–20)
CALCIUM: 9 mg/dL (ref 8.9–10.3)
CO2: 26 mmol/L (ref 22–32)
Chloride: 99 mmol/L — ABNORMAL LOW (ref 101–111)
Creatinine, Ser: 0.93 mg/dL (ref 0.61–1.24)
Glucose, Bld: 99 mg/dL (ref 65–99)
Potassium: 4.1 mmol/L (ref 3.5–5.1)
Sodium: 129 mmol/L — ABNORMAL LOW (ref 135–145)

## 2016-04-02 LAB — CBC WITH DIFFERENTIAL/PLATELET
BASOS ABS: 0.1 10*3/uL (ref 0–0.1)
BASOS PCT: 1 %
EOS PCT: 1 %
Eosinophils Absolute: 0.1 10*3/uL (ref 0–0.7)
HEMATOCRIT: 36 % — AB (ref 40.0–52.0)
Hemoglobin: 12.7 g/dL — ABNORMAL LOW (ref 13.0–18.0)
Lymphocytes Relative: 19 %
Lymphs Abs: 1.2 10*3/uL (ref 1.0–3.6)
MCH: 33.7 pg (ref 26.0–34.0)
MCHC: 35.1 g/dL (ref 32.0–36.0)
MCV: 95.9 fL (ref 80.0–100.0)
MONO ABS: 1.1 10*3/uL — AB (ref 0.2–1.0)
Monocytes Relative: 17 %
NEUTROS ABS: 4.1 10*3/uL (ref 1.4–6.5)
Neutrophils Relative %: 62 %
PLATELETS: 213 10*3/uL (ref 150–440)
RBC: 3.76 MIL/uL — ABNORMAL LOW (ref 4.40–5.90)
RDW: 13.6 % (ref 11.5–14.5)
WBC: 6.7 10*3/uL (ref 3.8–10.6)

## 2016-04-02 MED ORDER — PALONOSETRON HCL INJECTION 0.25 MG/5ML
0.2500 mg | Freq: Once | INTRAVENOUS | Status: AC
  Administered 2016-04-02: 0.25 mg via INTRAVENOUS
  Filled 2016-04-02: qty 5

## 2016-04-02 MED ORDER — SODIUM CHLORIDE 0.9 % IV SOLN
Freq: Once | INTRAVENOUS | Status: AC
  Administered 2016-04-02: 11:00:00 via INTRAVENOUS
  Filled 2016-04-02: qty 1000

## 2016-04-02 MED ORDER — POTASSIUM CHLORIDE 2 MEQ/ML IV SOLN
Freq: Once | INTRAVENOUS | Status: AC
  Administered 2016-04-02: 11:00:00 via INTRAVENOUS
  Filled 2016-04-02: qty 1000

## 2016-04-02 MED ORDER — SODIUM CHLORIDE 0.9 % IV SOLN
Freq: Once | INTRAVENOUS | Status: AC
  Administered 2016-04-02: 13:00:00 via INTRAVENOUS
  Filled 2016-04-02: qty 5

## 2016-04-02 MED ORDER — SODIUM CHLORIDE 0.9 % IV SOLN
40.0000 mg/m2 | Freq: Once | INTRAVENOUS | Status: AC
  Administered 2016-04-02: 67 mg via INTRAVENOUS
  Filled 2016-04-02: qty 67

## 2016-04-02 NOTE — Progress Notes (Signed)
Nutrition Follow-up:  Nutrition follow-up completed during infusion.  Patient of Dr. Grayland Ormond.  Patient with new diagnosis of stage IV squamous cell carcinoma of larynx.  Patient undergoing chemotherapy and radiation  Patient continues to report that he has a good appetite. " I eat all that they give me." "I drank all those drinks (boost) you gave me last time." Reports this am ate 2 link sausage, egg, toast with jelly, coffee and juice before coming to clinic.  Said he couldn't remember what he ate last night for dinner or yesterday for lunch.   Reports no problems swallowing or chewing food, no dry mouth or thick saliva.  No issues with nausea or vomiting.    Medications: reviewed  Labs: Na 129, creatinine WDL, BUN 24  Anthropometrics:   Patient's weight has decreased 2 pounds from last week 127 pounds today vs 129 pounds on 2/8.     NUTRITION DIAGNOSIS: Malnutrition continues   MALNUTRITION DIAGNOSIS: Severe malnutrition continues   INTERVENTION:   Discussed importance of nutrition and eating to maintain weight.  Reviewed foods that he could eat that are higher in calories and protein. Limited by what care home provides for him.  Unable to afford additional snacks. Sister rarely visits and patient does not drive.   Boost and ensure samples given to patient today     MONITORING, EVALUATION, GOAL: Patient will consume adequate calories and protein to prevent further weight loss.   NEXT VISIT: Feb 22 during infusion  Aubrynn Katona B. Zenia Resides, [place], [place] (pager)

## 2016-04-02 NOTE — Progress Notes (Signed)
Patient here for pre treatment check. He is having difficulty sleeping which is not a new problem.

## 2016-04-03 ENCOUNTER — Ambulatory Visit: Payer: Medicare Other

## 2016-04-03 ENCOUNTER — Ambulatory Visit
Admission: RE | Admit: 2016-04-03 | Discharge: 2016-04-03 | Disposition: A | Discharge status: Home / Self Care | Payer: Medicare Other | Source: Ambulatory Visit | Attending: Radiation Oncology | Admitting: Radiation Oncology

## 2016-04-03 DIAGNOSIS — Z510 Encounter for antineoplastic radiation therapy: Secondary | ICD-10-CM | POA: Diagnosis not present

## 2016-04-06 ENCOUNTER — Ambulatory Visit
Admission: RE | Admit: 2016-04-06 | Discharge: 2016-04-06 | Disposition: A | Discharge status: Home / Self Care | Payer: Medicare Other | Source: Ambulatory Visit | Attending: Radiation Oncology | Admitting: Radiation Oncology

## 2016-04-06 DIAGNOSIS — Z510 Encounter for antineoplastic radiation therapy: Secondary | ICD-10-CM | POA: Diagnosis not present

## 2016-04-07 ENCOUNTER — Ambulatory Visit
Admission: RE | Admit: 2016-04-07 | Discharge: 2016-04-07 | Disposition: A | Discharge status: Home / Self Care | Payer: Medicare Other | Source: Ambulatory Visit | Attending: Radiation Oncology | Admitting: Radiation Oncology

## 2016-04-07 ENCOUNTER — Other Ambulatory Visit: Payer: Self-pay | Admitting: *Deleted

## 2016-04-07 DIAGNOSIS — Z510 Encounter for antineoplastic radiation therapy: Secondary | ICD-10-CM | POA: Diagnosis not present

## 2016-04-08 ENCOUNTER — Ambulatory Visit
Admission: RE | Admit: 2016-04-08 | Discharge: 2016-04-08 | Disposition: A | Discharge status: Home / Self Care | Payer: Medicare Other | Source: Ambulatory Visit | Attending: Radiation Oncology | Admitting: Radiation Oncology

## 2016-04-08 DIAGNOSIS — Z510 Encounter for antineoplastic radiation therapy: Secondary | ICD-10-CM | POA: Diagnosis not present

## 2016-04-08 NOTE — Progress Notes (Signed)
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 He is tolerating his treatments well without significant side effects. He continues to smoke heavily. He has no neurologic complaints. He denies any recent fevers or illnesses. He denies any dysphasia. He has a fair appetite, but denies weight loss. He has no chest pain or shortness of breath. He denies any nausea, vomiting, constipation, or diarrhea. He has no urinary complaints. Patient offers no further specific complaints.  REVIEW OF SYSTEMS:   Review of Systems  Constitutional: Negative.  Negative for fever, malaise/fatigue and weight loss.  Respiratory: Negative.  Negative for cough and shortness of breath.   Cardiovascular: Negative.  Negative for chest pain and leg swelling.  Gastrointestinal: Negative.  Negative for abdominal pain.  Genitourinary: Negative.   Musculoskeletal: Negative.   Neurological: Negative.  Negative for sensory change and weakness.  Psychiatric/Behavioral: Positive for substance abuse. The patient has insomnia.     As per HPI. Otherwise, a complete review of systems is negative.  PAST MEDICAL HISTORY: Past Medical History:  Diagnosis Date  . Alcohol abuse   . Cirrhosis ([place])   . COPD (chronic obstructive pulmonary disease) ([place])     PAST SURGICAL HISTORY: Past Surgical History:  Procedure Laterality Date  . TONSILLECTOMY     age was 35  . VASECTOMY      FAMILY HISTORY: Family History  Problem Relation Age of Onset  . Breast cancer Mother     ADVANCED DIRECTIVES (Y/N):  N  HEALTH MAINTENANCE: Social  History  Substance Use Topics  . Smoking status: Current Every Day Smoker    Packs/day: 0.50    Types: Cigarettes  . Smokeless tobacco: Never Used  . Alcohol use Yes     Colonoscopy:  PAP:  Bone density:  Lipid panel:  Allergies  Allergen Reactions  . Penicillins Other (See Comments)    unknown    Current Outpatient Prescriptions  Medication Sig Dispense Refill  . ADVAIR DISKUS 250-50 MCG/DOSE AEPB Inhale 2 puffs into the lungs daily.    Marland Kitchen albuterol (PROVENTIL HFA;VENTOLIN HFA) 108 (90 Base) MCG/ACT inhaler Inhale 2 puffs into the lungs every 6 (six) hours as needed for wheezing or shortness of breath.    Marland Kitchen alendronate (FOSAMAX) 70 MG tablet Take 70 mg by mouth once a week.     . cholecalciferol (VITAMIN D) 1000 units tablet Take 5,000 Units by mouth daily.    Marland Kitchen donepezil (ARICEPT) 5 MG tablet     . folic acid (FOLVITE) 1 MG tablet Take 1 mg by mouth daily.    . furosemide (LASIX) 20 MG tablet Take 20 mg by mouth daily.     Marland Kitchen lactulose (CHRONULAC) 10 GM/15ML solution Take 20 g by mouth 3 (three) times daily.    . Ledipasvir-Sofosbuvir (HARVONI) 90-400 MG TABS Take 1 tablet by mouth daily. 12 weeks,    . LORazepam (ATIVAN) 0.5 MG tablet Take 1 mg by mouth at bedtime.     . magnesium oxide (MAG-OX) 400 MG tablet Take 800 mg by mouth 3 (three) times daily.     . Melatonin 3 MG TABS Take 6 mg by mouth  at bedtime.    . meloxicam (MOBIC) 7.5 MG tablet Take 7.5 mg by mouth daily.     . Multiple Vitamins-Minerals (MULTIVITAMIN WITH MINERALS) tablet Take 1 tablet by mouth daily.    . nadolol (CORGARD) 20 MG tablet Take 10 mg by mouth daily.     . ondansetron (ZOFRAN) 8 MG tablet Take 1 tablet (8 mg total) by mouth 2 (two) times daily as needed. 30 tablet 1  . polyethylene glycol (MIRALAX / GLYCOLAX) packet Take 17 g by mouth daily.    . potassium chloride (K-DUR) 10 MEQ tablet Take 20 mEq by mouth daily.     . prochlorperazine (COMPAZINE) 10 MG tablet Take 1 tablet (10 mg total) by  mouth every 6 (six) hours as needed (Nausea or vomiting). 30 tablet 1  . QUEtiapine (SEROQUEL) 50 MG tablet Take 100 mg by mouth at bedtime.     Marland Kitchen SPIRIVA HANDIHALER 18 MCG inhalation capsule Place 18 mcg into inhaler and inhale daily.     Marland Kitchen spironolactone (ALDACTONE) 50 MG tablet Take 50 mg by mouth daily.     . tamsulosin (FLOMAX) 0.4 MG CAPS capsule Take 0.4 mg by mouth 2 (two) times daily.     Marland Kitchen thiamine 100 MG tablet Take 100 mg by mouth daily.    . traZODone (DESYREL) 50 MG tablet Take 50 mg by mouth at bedtime.    Marland Kitchen venlafaxine XR (EFFEXOR-XR) 150 MG 24 hr capsule Take 150 mg by mouth.     . vitamin B-12 (CYANOCOBALAMIN) 1000 MCG tablet Take 1,000 mcg by mouth daily.     No current facility-administered medications for this visit.     OBJECTIVE: Vitals:   04/09/16 0908  BP: 134/81  Pulse: 66  Resp: 18  Temp: 97.2 F (36.2 C)     Body mass index is 21.1 kg/m.    ECOG FS:0 - Asymptomatic  General: Well-developed, well-nourished, no acute distress. Eyes: Pink conjunctiva, anicteric sclera. HEENT: Normocephalic, moist mucous membranes, clear oropharnyx. Easily palpable lymphadenopathy in right cervical chain. Lungs: Clear to auscultation bilaterally. Heart: Regular rate and rhythm. No rubs, murmurs, or gallops. Abdomen: Soft, nontender, nondistended. No organomegaly noted, normoactive bowel sounds. Musculoskeletal: No edema, cyanosis, or clubbing. Neuro: Alert, answering all questions appropriately. Cranial nerves grossly intact. Skin: No rashes or petechiae noted. Psych: Normal affect.   LAB RESULTS:  Lab Results  Component Value Date   NA 130 (L) 04/09/2016   K 4.4 04/09/2016   CL 96 (L) 04/09/2016   CO2 28 04/09/2016   GLUCOSE 117 (H) 04/09/2016   BUN 17 04/09/2016   CREATININE 0.72 04/09/2016   CALCIUM 9.3 04/09/2016   PROT 6.7 11/18/2015   ALBUMIN 3.0 (L) 11/18/2015   AST 125 (H) 11/18/2015   ALT 53 11/18/2015   ALKPHOS 96 11/18/2015   BILITOT 0.5  11/18/2015   GFRNONAA >60 04/09/2016   GFRAA >60 04/09/2016    Lab Results  Component Value Date   WBC 8.4 04/09/2016   NEUTROABS 6.1 04/09/2016   HGB 12.6 (L) 04/09/2016   HCT 35.8 (L) 04/09/2016   MCV 96.3 04/09/2016   PLT 209 04/09/2016     STUDIES: No results found.  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  PLAN:    1. 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 Given patient's history of noncompliance, will not pursue induction chemotherapy and proceed with concurrent radiation therapy  and weekly chemotherapy using cisplatin. Proceed with cycle 3 of weekly cisplatin. Return to clinic in 1 week for consideration of cycle 4.  2. Social situation: Because patient is a ward of the state, he will require his power of attorney to be present for any further consents. 3. Smoking cessation: Although he acknowledges the risks, patient has declined to discontinue tobacco at this time. 4. Dietary: Patient has an appointment with dietary on April 09, 2017. 5. Insomnia: Although the patient reports he does not sleep, his caretaker states he sleeps adequately.   Patient expressed understanding and was in agreement with this plan. He also understands that He can call clinic at any time with any questions, concerns, or complaints.    Cancer Staging Primary cancer of larynx [facility]) Staging form: Larynx - Supraglottis, AJCC 8th Edition - Clinical stage from 02/27/2016: Stage IVA (cT1, cN2b, cM0) - Signed by Lloyd Huger, MD on 02/27/2016   Lloyd Huger, MD   04/12/2016 9:28 AM

## 2016-04-09 ENCOUNTER — Ambulatory Visit
Admission: RE | Admit: 2016-04-09 | Discharge: 2016-04-09 | Disposition: A | Discharge status: Home / Self Care | Payer: Medicare Other | Source: Ambulatory Visit | Attending: Radiation Oncology | Admitting: Radiation Oncology

## 2016-04-09 ENCOUNTER — Inpatient Hospital Stay: Payer: Medicare Other

## 2016-04-09 ENCOUNTER — Inpatient Hospital Stay (HOSPITAL_BASED_OUTPATIENT_CLINIC_OR_DEPARTMENT_OTHER): Payer: Medicare Other | Admitting: Oncology

## 2016-04-09 VITALS — BP 134/81 | HR 66 | Temp 97.2°F | Resp 18 | Wt 130.7 lb

## 2016-04-09 DIAGNOSIS — Z5111 Encounter for antineoplastic chemotherapy: Secondary | ICD-10-CM | POA: Diagnosis not present

## 2016-04-09 DIAGNOSIS — Z510 Encounter for antineoplastic radiation therapy: Secondary | ICD-10-CM | POA: Diagnosis not present

## 2016-04-09 DIAGNOSIS — R590 Localized enlarged lymph nodes: Secondary | ICD-10-CM | POA: Diagnosis not present

## 2016-04-09 DIAGNOSIS — K7030 Alcoholic cirrhosis of liver without ascites: Secondary | ICD-10-CM

## 2016-04-09 DIAGNOSIS — F17210 Nicotine dependence, cigarettes, uncomplicated: Secondary | ICD-10-CM

## 2016-04-09 DIAGNOSIS — Z9119 Patient's noncompliance with other medical treatment and regimen: Secondary | ICD-10-CM

## 2016-04-09 DIAGNOSIS — C321 Malignant neoplasm of supraglottis: Secondary | ICD-10-CM

## 2016-04-09 DIAGNOSIS — Z880 Allergy status to penicillin: Secondary | ICD-10-CM | POA: Diagnosis not present

## 2016-04-09 DIAGNOSIS — G4700 Insomnia, unspecified: Secondary | ICD-10-CM

## 2016-04-09 DIAGNOSIS — C329 Malignant neoplasm of larynx, unspecified: Secondary | ICD-10-CM

## 2016-04-09 DIAGNOSIS — Z803 Family history of malignant neoplasm of breast: Secondary | ICD-10-CM

## 2016-04-09 DIAGNOSIS — Z79899 Other long term (current) drug therapy: Secondary | ICD-10-CM | POA: Diagnosis not present

## 2016-04-09 DIAGNOSIS — J449 Chronic obstructive pulmonary disease, unspecified: Secondary | ICD-10-CM | POA: Diagnosis not present

## 2016-04-09 LAB — BASIC METABOLIC PANEL
ANION GAP: 6 (ref 5–15)
BUN: 17 mg/dL (ref 6–20)
CALCIUM: 9.3 mg/dL (ref 8.9–10.3)
CO2: 28 mmol/L (ref 22–32)
Chloride: 96 mmol/L — ABNORMAL LOW (ref 101–111)
Creatinine, Ser: 0.72 mg/dL (ref 0.61–1.24)
GFR calc Af Amer: >60 mL/min (ref >60)
GFR calc non Af Amer: >60 mL/min (ref >60)
GLUCOSE: 117 mg/dL — AB (ref 65–99)
Potassium: 4.4 mmol/L (ref 3.5–5.1)
Sodium: 130 mmol/L — ABNORMAL LOW (ref 135–145)

## 2016-04-09 LAB — CBC WITH DIFFERENTIAL/PLATELET
BASOS ABS: 0 10*3/uL (ref 0–0.1)
Basophils Relative: 1 %
EOS PCT: 0 %
Eosinophils Absolute: 0 10*3/uL (ref 0–0.7)
HEMATOCRIT: 35.8 % — AB (ref 40.0–52.0)
Hemoglobin: 12.6 g/dL — ABNORMAL LOW (ref 13.0–18.0)
LYMPHS ABS: 1.4 10*3/uL (ref 1.0–3.6)
LYMPHS PCT: 16 %
MCH: 34 pg (ref 26.0–34.0)
MCHC: 35.3 g/dL (ref 32.0–36.0)
MCV: 96.3 fL (ref 80.0–100.0)
MONO ABS: 0.9 10*3/uL (ref 0.2–1.0)
MONOS PCT: 11 %
NEUTROS ABS: 6.1 10*3/uL (ref 1.4–6.5)
Neutrophils Relative %: 72 %
Platelets: 209 10*3/uL (ref 150–440)
RBC: 3.72 MIL/uL — ABNORMAL LOW (ref 4.40–5.90)
RDW: 14.1 % (ref 11.5–14.5)
WBC: 8.4 10*3/uL (ref 3.8–10.6)

## 2016-04-09 MED ORDER — POTASSIUM CHLORIDE 2 MEQ/ML IV SOLN
Freq: Once | INTRAVENOUS | Status: AC
  Administered 2016-04-09: 10:00:00 via INTRAVENOUS
  Filled 2016-04-09: qty 0

## 2016-04-09 MED ORDER — PALONOSETRON HCL INJECTION 0.25 MG/5ML
0.2500 mg | Freq: Once | INTRAVENOUS | Status: AC
  Administered 2016-04-09: 0.25 mg via INTRAVENOUS
  Filled 2016-04-09: qty 5

## 2016-04-09 MED ORDER — SODIUM CHLORIDE 0.9 % IV SOLN
40.0000 mg/m2 | Freq: Once | INTRAVENOUS | Status: AC
  Administered 2016-04-09: 67 mg via INTRAVENOUS
  Filled 2016-04-09: qty 67

## 2016-04-09 MED ORDER — SODIUM CHLORIDE 0.9 % IV SOLN
Freq: Once | INTRAVENOUS | Status: AC
  Administered 2016-04-09: 10:00:00 via INTRAVENOUS
  Filled 2016-04-09: qty 1000

## 2016-04-09 MED ORDER — SODIUM CHLORIDE 0.9 % IV SOLN
Freq: Once | INTRAVENOUS | Status: AC
  Administered 2016-04-09: 12:00:00 via INTRAVENOUS
  Filled 2016-04-09: qty 5

## 2016-04-09 NOTE — Progress Notes (Signed)
Nutrition Follow-up:  Brief nutrition follow-up completed in infusion this am.  Patient of Dr. Grayland Ormond.  Patient with new stage IV squamous cell carcinoma of larynx.  Patient undergoing chemotherapy and radiation.   Patient continues to report that he is eating " everything they give me." Reports that he is drinking the boost plus 3 times per day that the center is giving him.  Noted Elease Etienne has worked with caregiver Vincente Liberty and able to provide supplements to patient.    Continues to report no problems chewing or swallowing.  Hard to keep on track as patient wanting this Probation officer to play a card game with him.  Also stated that he has run into a lot of money recently.  Told Probation officer that he purchase a [place] diamond years ago for $5000, swallowed it after drinking lots of beer to get it [place] customs and has recently had it appraised and it is worth 1 billion dollars.     Medications: reviewed  Labs: reviewed  Anthropometrics:   Weight has increased today to 130 pounds 11.7 oz as was 127 pounds 3.3 oz on 2/15.      NUTRITION DIAGNOSIS: Malnutrition improving with weight gain   MALNUTRITION DIAGNOSIS: Severe malnutrition improving   INTERVENTION:   Continued to discuss the importance of patient drinking boost plus supplements 3 times per day to prevent weight loss. Patient dependent on foods prepared at care home.  Patient unable to afford supplements but Elease Etienne has worked with Vincente Liberty, patient caregiver to support cost of boost plus.  Patient has limited family that will support him with food.      MONITORING, EVALUATION, GOAL: Patient will consume adequate calories and protein to prevent further weight loss.   NEXT VISIT: March 1 during infusion  Jariana Shumard B. Zenia Resides, [place], [place] Registered Dietitian [phone] (pager)

## 2016-04-09 NOTE — Progress Notes (Signed)
Offers no complaints. States is feeling well. 

## 2016-04-10 ENCOUNTER — Ambulatory Visit
Admission: RE | Admit: 2016-04-10 | Discharge: 2016-04-10 | Disposition: A | Discharge status: Home / Self Care | Payer: Medicare Other | Source: Ambulatory Visit | Attending: Radiation Oncology | Admitting: Radiation Oncology

## 2016-04-10 DIAGNOSIS — Z510 Encounter for antineoplastic radiation therapy: Secondary | ICD-10-CM | POA: Diagnosis not present

## 2016-04-10 MED FILL — Potassium Chloride Inj 2 mEq/ML: INTRAVENOUS | Qty: 20 | Status: AC

## 2016-04-13 ENCOUNTER — Other Ambulatory Visit: Payer: Self-pay | Admitting: *Deleted

## 2016-04-13 ENCOUNTER — Ambulatory Visit
Admission: RE | Admit: 2016-04-13 | Discharge: 2016-04-13 | Disposition: A | Discharge status: Home / Self Care | Payer: Medicare Other | Source: Ambulatory Visit | Attending: Radiation Oncology | Admitting: Radiation Oncology

## 2016-04-13 DIAGNOSIS — Z510 Encounter for antineoplastic radiation therapy: Secondary | ICD-10-CM | POA: Diagnosis not present

## 2016-04-13 MED ORDER — SUCRALFATE 1 G PO TABS: 1.0000 g | ORAL_TABLET | Freq: Three times a day (TID) | ORAL | 2 refills | Status: DC

## 2016-04-13 MED ORDER — SUCRALFATE 1 G PO TABS: 1.0000 g | ORAL_TABLET | Freq: Three times a day (TID) | ORAL | 3 refills | Status: DC

## 2016-04-14 ENCOUNTER — Ambulatory Visit
Admission: RE | Admit: 2016-04-14 | Discharge: 2016-04-14 | Disposition: A | Discharge status: Home / Self Care | Payer: Medicare Other | Source: Ambulatory Visit | Attending: Radiation Oncology | Admitting: Radiation Oncology

## 2016-04-14 DIAGNOSIS — Z510 Encounter for antineoplastic radiation therapy: Secondary | ICD-10-CM | POA: Diagnosis not present

## 2016-04-15 ENCOUNTER — Ambulatory Visit
Admission: RE | Admit: 2016-04-15 | Discharge: 2016-04-15 | Disposition: A | Discharge status: Home / Self Care | Payer: Medicare Other | Source: Ambulatory Visit | Attending: Radiation Oncology | Admitting: Radiation Oncology

## 2016-04-15 DIAGNOSIS — Z510 Encounter for antineoplastic radiation therapy: Secondary | ICD-10-CM | POA: Diagnosis not present

## 2016-04-15 NOTE — Progress Notes (Signed)
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 He is tolerating his treatments well without significant side effects. He continues to smoke heavily. He has no neurologic complaints. He denies any recent fevers or illnesses. He denies any dysphasia. He has a fair appetite, but denies weight loss. He has no chest pain or shortness of breath. He denies any nausea, vomiting, constipation, or diarrhea. He has no urinary complaints. Patient offers no further specific complaints.  REVIEW OF SYSTEMS:   Review of Systems  Constitutional: Negative.  Negative for fever, malaise/fatigue and weight loss.  Respiratory: Negative.  Negative for cough and shortness of breath.   Cardiovascular: Negative.  Negative for chest pain and leg swelling.  Gastrointestinal: Negative.  Negative for abdominal pain.  Genitourinary: Negative.   Musculoskeletal: Negative.   Neurological: Negative.  Negative for sensory change and weakness.  Psychiatric/Behavioral: Positive for substance abuse. The patient has insomnia.     As per HPI. Otherwise, a complete review of systems is negative.  PAST MEDICAL HISTORY: Past Medical History:  Diagnosis Date  . Alcohol abuse   . Cirrhosis ([place])   . COPD (chronic obstructive pulmonary disease) ([place])     PAST SURGICAL HISTORY: Past Surgical History:  Procedure Laterality Date  . TONSILLECTOMY     age was 63  . VASECTOMY      FAMILY HISTORY: Family History  Problem Relation Age of Onset  . Breast cancer Mother     ADVANCED DIRECTIVES (Y/N):  N  HEALTH MAINTENANCE: Social  History  Substance Use Topics  . Smoking status: Current Every Day Smoker    Packs/day: 0.50    Types: Cigarettes  . Smokeless tobacco: Never Used  . Alcohol use Yes     Colonoscopy:  PAP:  Bone density:  Lipid panel:  Allergies  Allergen Reactions  . Penicillins Other (See Comments)    unknown    Current Outpatient Prescriptions  Medication Sig Dispense Refill  . ADVAIR DISKUS 250-50 MCG/DOSE AEPB Inhale 2 puffs into the lungs daily.    Marland Kitchen albuterol (PROVENTIL HFA;VENTOLIN HFA) 108 (90 Base) MCG/ACT inhaler Inhale 2 puffs into the lungs every 6 (six) hours as needed for wheezing or shortness of breath.    Marland Kitchen alendronate (FOSAMAX) 70 MG tablet Take 70 mg by mouth once a week.     . cholecalciferol (VITAMIN D) 1000 units tablet Take 5,000 Units by mouth daily.    Marland Kitchen donepezil (ARICEPT) 5 MG tablet     . folic acid (FOLVITE) 1 MG tablet Take 1 mg by mouth daily.    . furosemide (LASIX) 20 MG tablet Take 20 mg by mouth daily.     Marland Kitchen lactulose (CHRONULAC) 10 GM/15ML solution Take 20 g by mouth 3 (three) times daily.    . Ledipasvir-Sofosbuvir (HARVONI) 90-400 MG TABS Take 1 tablet by mouth daily. 12 weeks,    . LORazepam (ATIVAN) 0.5 MG tablet Take 1 mg by mouth at bedtime.     . magnesium oxide (MAG-OX) 400 MG tablet Take 800 mg by mouth 3 (three) times daily.     . Melatonin 3 MG TABS Take 6 mg by mouth  at bedtime.    . meloxicam (MOBIC) 7.5 MG tablet Take 7.5 mg by mouth daily.     . Multiple Vitamins-Minerals (MULTIVITAMIN WITH MINERALS) tablet Take 1 tablet by mouth daily.    . nadolol (CORGARD) 20 MG tablet Take 10 mg by mouth daily.     . ondansetron (ZOFRAN) 8 MG tablet Take 1 tablet (8 mg total) by mouth 2 (two) times daily as needed. 30 tablet 1  . polyethylene glycol (MIRALAX / GLYCOLAX) packet Take 17 g by mouth daily.    . potassium chloride (K-DUR) 10 MEQ tablet Take 20 mEq by mouth daily.     . prochlorperazine (COMPAZINE) 10 MG tablet Take 1 tablet (10 mg total) by  mouth every 6 (six) hours as needed (Nausea or vomiting). 30 tablet 1  . QUEtiapine (SEROQUEL) 50 MG tablet Take 100 mg by mouth at bedtime.     Marland Kitchen SPIRIVA HANDIHALER 18 MCG inhalation capsule Place 18 mcg into inhaler and inhale daily.     Marland Kitchen spironolactone (ALDACTONE) 50 MG tablet Take 50 mg by mouth daily.     . sucralfate (CARAFATE) 1 g tablet Take 1 tablet (1 g total) by mouth 3 (three) times daily. Dissolve in 4 tbs warm water, swish and swallow 90 tablet 2  . tamsulosin (FLOMAX) 0.4 MG CAPS capsule Take 0.4 mg by mouth 2 (two) times daily.     Marland Kitchen thiamine 100 MG tablet Take 100 mg by mouth daily.    . traZODone (DESYREL) 50 MG tablet Take 50 mg by mouth at bedtime.    Marland Kitchen venlafaxine XR (EFFEXOR-XR) 150 MG 24 hr capsule Take 150 mg by mouth.     . vitamin B-12 (CYANOCOBALAMIN) 1000 MCG tablet Take 1,000 mcg by mouth daily.    . magic mouthwash w/lidocaine SOLN Take 5 mLs by mouth 3 (three) times daily as needed for mouth pain. 300 mL 0   No current facility-administered medications for this visit.     OBJECTIVE: Vitals:   04/16/16 1050  BP: 122/74  Pulse: (!) 58  Resp: 18  Temp: 97.4 F (36.3 C)     Body mass index is 20.28 kg/m.    ECOG FS:0 - Asymptomatic  General: Well-developed, well-nourished, no acute distress. Eyes: Pink conjunctiva, anicteric sclera. HEENT: Normocephalic, moist mucous membranes, clear oropharnyx. Easily palpable lymphadenopathy in right cervical chain, improved. Lungs: Clear to auscultation bilaterally. Heart: Regular rate and rhythm. No rubs, murmurs, or gallops. Abdomen: Soft, nontender, nondistended. No organomegaly noted, normoactive bowel sounds. Musculoskeletal: No edema, cyanosis, or clubbing. Neuro: Alert, answering all questions appropriately. Cranial nerves grossly intact. Skin: No rashes or petechiae noted. Psych: Normal affect.   LAB RESULTS:  Lab Results  Component Value Date   NA 132 (L) 04/16/2016   K 4.1 04/16/2016   CL 99 (L)  04/16/2016   CO2 27 04/16/2016   GLUCOSE 118 (H) 04/16/2016   BUN 22 (H) 04/16/2016   CREATININE 0.89 04/16/2016   CALCIUM 9.2 04/16/2016   PROT 6.7 11/18/2015   ALBUMIN 3.0 (L) 11/18/2015   AST 125 (H) 11/18/2015   ALT 53 11/18/2015   ALKPHOS 96 11/18/2015   BILITOT 0.5 11/18/2015   GFRNONAA >60 04/16/2016   GFRAA >60 04/16/2016    Lab Results  Component Value Date   WBC 5.1 04/16/2016   NEUTROABS 3.2 04/16/2016   HGB 12.1 (L) 04/16/2016   HCT 34.7 (L) 04/16/2016   MCV 96.8 04/16/2016   PLT 164 04/16/2016     STUDIES:  No results found.  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  PLAN:    1. 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 Given patient's history of noncompliance, will not pursue induction chemotherapy and proceed with concurrent radiation therapy and weekly chemotherapy using cisplatin. Continue daily XRT completing on May 14, 2016. Proceed with cycle 4 of weekly cisplatin. Return to clinic in 1 week for consideration of cycle 5.  2. Social situation: Because patient is a ward of the state, he will require his power of attorney to be present for any further consents. 3. Smoking cessation: Although he acknowledges the risks, patient has declined to discontinue tobacco at this time. 4. Dietary: Appreciate dietary input. 5. Insomnia: Although the patient reports he does not sleep, his caretaker states he sleeps adequately.   Patient expressed understanding and was in agreement with this plan. He also understands that He can call clinic at any time with any questions, concerns, or complaints.    Cancer Staging Primary cancer of larynx [facility]) Staging form: Larynx - Supraglottis, AJCC 8th Edition - Clinical stage from 02/27/2016: Stage IVA (cT1, cN2b, cM0) - Signed by Lloyd Huger, MD on 02/27/2016   Lloyd Huger, MD   04/19/2016 5:38  PM

## 2016-04-16 ENCOUNTER — Inpatient Hospital Stay: Payer: Medicare Other

## 2016-04-16 ENCOUNTER — Ambulatory Visit
Admission: RE | Admit: 2016-04-16 | Discharge: 2016-04-16 | Disposition: A | Discharge status: Home / Self Care | Payer: Medicare Other | Source: Ambulatory Visit | Attending: Radiation Oncology | Admitting: Radiation Oncology

## 2016-04-16 ENCOUNTER — Inpatient Hospital Stay (HOSPITAL_BASED_OUTPATIENT_CLINIC_OR_DEPARTMENT_OTHER): Payer: Medicare Other | Admitting: Oncology

## 2016-04-16 ENCOUNTER — Inpatient Hospital Stay: Payer: Medicare Other | Attending: Oncology

## 2016-04-16 VITALS — BP 122/74 | HR 58 | Temp 97.4°F | Resp 18 | Wt 125.7 lb

## 2016-04-16 DIAGNOSIS — J449 Chronic obstructive pulmonary disease, unspecified: Secondary | ICD-10-CM | POA: Diagnosis not present

## 2016-04-16 DIAGNOSIS — Z803 Family history of malignant neoplasm of breast: Secondary | ICD-10-CM | POA: Diagnosis not present

## 2016-04-16 DIAGNOSIS — G4700 Insomnia, unspecified: Secondary | ICD-10-CM

## 2016-04-16 DIAGNOSIS — C321 Malignant neoplasm of supraglottis: Secondary | ICD-10-CM | POA: Insufficient documentation

## 2016-04-16 DIAGNOSIS — C329 Malignant neoplasm of larynx, unspecified: Secondary | ICD-10-CM

## 2016-04-16 DIAGNOSIS — Z9119 Patient's noncompliance with other medical treatment and regimen: Secondary | ICD-10-CM | POA: Diagnosis not present

## 2016-04-16 DIAGNOSIS — Z880 Allergy status to penicillin: Secondary | ICD-10-CM | POA: Insufficient documentation

## 2016-04-16 DIAGNOSIS — Z79899 Other long term (current) drug therapy: Secondary | ICD-10-CM | POA: Insufficient documentation

## 2016-04-16 DIAGNOSIS — K7030 Alcoholic cirrhosis of liver without ascites: Secondary | ICD-10-CM

## 2016-04-16 DIAGNOSIS — F17210 Nicotine dependence, cigarettes, uncomplicated: Secondary | ICD-10-CM | POA: Insufficient documentation

## 2016-04-16 DIAGNOSIS — K1379 Other lesions of oral mucosa: Secondary | ICD-10-CM | POA: Insufficient documentation

## 2016-04-16 DIAGNOSIS — Z5111 Encounter for antineoplastic chemotherapy: Secondary | ICD-10-CM | POA: Insufficient documentation

## 2016-04-16 DIAGNOSIS — R590 Localized enlarged lymph nodes: Secondary | ICD-10-CM | POA: Diagnosis not present

## 2016-04-16 DIAGNOSIS — Z510 Encounter for antineoplastic radiation therapy: Secondary | ICD-10-CM | POA: Diagnosis not present

## 2016-04-16 DIAGNOSIS — R1310 Dysphagia, unspecified: Secondary | ICD-10-CM | POA: Diagnosis not present

## 2016-04-16 LAB — CBC WITH DIFFERENTIAL/PLATELET
BASOS ABS: 0 10*3/uL (ref 0–0.1)
BASOS PCT: 1 %
Eosinophils Absolute: 0 10*3/uL (ref 0–0.7)
Eosinophils Relative: 0 %
HEMATOCRIT: 34.7 % — AB (ref 40.0–52.0)
HEMOGLOBIN: 12.1 g/dL — AB (ref 13.0–18.0)
LYMPHS PCT: 19 %
Lymphs Abs: 1 10*3/uL (ref 1.0–3.6)
MCH: 33.9 pg (ref 26.0–34.0)
MCHC: 35 g/dL (ref 32.0–36.0)
MCV: 96.8 fL (ref 80.0–100.0)
MONO ABS: 0.8 10*3/uL (ref 0.2–1.0)
Monocytes Relative: 16 %
NEUTROS ABS: 3.2 10*3/uL (ref 1.4–6.5)
NEUTROS PCT: 64 %
Platelets: 164 10*3/uL (ref 150–440)
RBC: 3.58 MIL/uL — ABNORMAL LOW (ref 4.40–5.90)
RDW: 14.2 % (ref 11.5–14.5)
WBC: 5.1 10*3/uL (ref 3.8–10.6)

## 2016-04-16 LAB — BASIC METABOLIC PANEL
ANION GAP: 6 (ref 5–15)
BUN: 22 mg/dL — AB (ref 6–20)
CALCIUM: 9.2 mg/dL (ref 8.9–10.3)
CO2: 27 mmol/L (ref 22–32)
CREATININE: 0.89 mg/dL (ref 0.61–1.24)
Chloride: 99 mmol/L — ABNORMAL LOW (ref 101–111)
Glucose, Bld: 118 mg/dL — ABNORMAL HIGH (ref 65–99)
POTASSIUM: 4.1 mmol/L (ref 3.5–5.1)
Sodium: 132 mmol/L — ABNORMAL LOW (ref 135–145)

## 2016-04-16 MED ORDER — MAGIC MOUTHWASH W/LIDOCAINE: 5.0000 mL | Freq: Three times a day (TID) | ORAL | 0 refills | Status: DC | PRN

## 2016-04-16 MED ORDER — SODIUM CHLORIDE 0.9 % IV SOLN
Freq: Once | INTRAVENOUS | Status: AC
  Administered 2016-04-16: 12:00:00 via INTRAVENOUS
  Filled 2016-04-16: qty 1000

## 2016-04-16 MED ORDER — POTASSIUM CHLORIDE 2 MEQ/ML IV SOLN
Freq: Once | INTRAVENOUS | Status: AC
  Administered 2016-04-16: 12:00:00 via INTRAVENOUS
  Filled 2016-04-16: qty 1000

## 2016-04-16 MED ORDER — CISPLATIN CHEMO INJECTION 100MG/100ML
40.0000 mg/m2 | Freq: Once | INTRAVENOUS | Status: AC
  Administered 2016-04-16: 67 mg via INTRAVENOUS
  Filled 2016-04-16: qty 67

## 2016-04-16 MED ORDER — PALONOSETRON HCL INJECTION 0.25 MG/5ML
0.2500 mg | Freq: Once | INTRAVENOUS | Status: AC
  Administered 2016-04-16: 0.25 mg via INTRAVENOUS
  Filled 2016-04-16: qty 5

## 2016-04-16 MED ORDER — SODIUM CHLORIDE 0.9 % IV SOLN
Freq: Once | INTRAVENOUS | Status: AC
  Administered 2016-04-16: 15:00:00 via INTRAVENOUS
  Filled 2016-04-16: qty 5

## 2016-04-16 NOTE — Progress Notes (Addendum)
Nutrition Follow-up:   Patient seen during infusion for stage IV squamous cell carcinoma of the larynx.  Patient receiving chemotherapy and radiation.   Patient wanting to play game during infusion.  Reports that his throat is sore.  No sores in mouth per report or trouble swallowing.  Shakes head yes that he has been dipping toast in coffee in the mornings to make it moist and easier to swallow.  Reports that he is eating "everything they give me."  Patient nods head yes that he is drinking boost 3 times per day.  Patient then talks about drinking beer, hard to keep on track with conversation.     Medications: reviewed  Labs: reviewed  Anthropometrics:   Patient weight today of 125 pounds 10.6 oz decreased from 130 pounds on 2/22.    NUTRITION DIAGNOSIS: Malnutrition continues   MALNUTRITION DIAGNOSIS: Severe malnutrition continues   INTERVENTION:   Encouraged patient to continue to consume soft liquid foods, provided examples of foods for patient to eat.   Encouraged patient to continue to drink boost plus TID for added calories and protein Called Tasha at [facility] where patient is a resident. Asked to speak with Serita Grit but Aniceto Boss reports she is patient's caregiver. Aniceto Boss reports that she is patient's caregiver and his appetite has decreased recently, eating about 50% of normal intake or not asking for seconds as usually would do.  Does report that he is drinking boost plus TID.  Talked to caregiver about preparing soft foods and adding gravy liquids to meats.  Also discussed soft moist protein foods and foods high in calories and protein to prepare for patient (ie ice cream, soups, chopped meats with gravies). Aniceto Boss reports that they can provide these items for patient.  At the end of the conversation Aniceto Boss gave this writer [employer] phone number, owner of the care home.  I called and left message on [place]'s phone.    Spoke with [facility] in [place] regarding SLP  evaluationMBSS not being scheduled.  Patient will be scheduled for March 14th at 1pm.  Discussed with RN, Hildred Alamin and she will notify patient/caregiver of upcoming appointment.     MONITORING, EVALUATION, GOAL: Patient will consume adequate calories and protein to prevent further weight loss.     NEXT VISIT: March 8th during infusion  Yutaka Holberg B. Zenia Resides, [place], [place] Registered Dietitian [phone] (pager)

## 2016-04-16 NOTE — Progress Notes (Signed)
Patient states he is having problems sleeping.  Also has a sore throat.  Hard to swallow anything that has a crust, toast, etc.

## 2016-04-17 ENCOUNTER — Ambulatory Visit
Admission: RE | Admit: 2016-04-17 | Discharge: 2016-04-17 | Disposition: A | Discharge status: Home / Self Care | Payer: Medicare Other | Source: Ambulatory Visit | Attending: Radiation Oncology | Admitting: Radiation Oncology

## 2016-04-17 DIAGNOSIS — Z510 Encounter for antineoplastic radiation therapy: Secondary | ICD-10-CM | POA: Diagnosis not present

## 2016-04-20 ENCOUNTER — Ambulatory Visit
Admission: RE | Admit: 2016-04-20 | Discharge: 2016-04-20 | Disposition: A | Discharge status: Home / Self Care | Payer: Medicare Other | Source: Ambulatory Visit | Attending: Radiation Oncology | Admitting: Radiation Oncology

## 2016-04-20 DIAGNOSIS — Z510 Encounter for antineoplastic radiation therapy: Secondary | ICD-10-CM | POA: Diagnosis not present

## 2016-04-21 ENCOUNTER — Ambulatory Visit
Admission: RE | Admit: 2016-04-21 | Discharge: 2016-04-21 | Disposition: A | Discharge status: Home / Self Care | Payer: Medicare Other | Source: Ambulatory Visit | Attending: Radiation Oncology | Admitting: Radiation Oncology

## 2016-04-21 DIAGNOSIS — Z510 Encounter for antineoplastic radiation therapy: Secondary | ICD-10-CM | POA: Diagnosis not present

## 2016-04-22 ENCOUNTER — Ambulatory Visit
Admission: RE | Admit: 2016-04-22 | Discharge: 2016-04-22 | Disposition: A | Discharge status: Home / Self Care | Payer: Medicare Other | Source: Ambulatory Visit | Attending: Radiation Oncology | Admitting: Radiation Oncology

## 2016-04-22 DIAGNOSIS — Z510 Encounter for antineoplastic radiation therapy: Secondary | ICD-10-CM | POA: Diagnosis not present

## 2016-04-22 NOTE — Progress Notes (Signed)
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 He is tolerating his treatments well without significant side effects. He continues to smoke heavily. He has no neurologic complaints. He denies any recent fevers or illnesses. He does admit to increasing dysphasia which is only mildly helped with Magic mouthwash and Carafate.  He has a fair appetite, but denies weight loss. He has no chest pain or shortness of breath. He denies any nausea, vomiting, constipation, or diarrhea. He has no urinary complaints. Patient offers no further specific complaints.  REVIEW OF SYSTEMS:   Review of Systems  Constitutional: Negative.  Negative for fever, malaise/fatigue and weight loss.  HENT: Positive for sore throat.   Respiratory: Negative.  Negative for cough and shortness of breath.   Cardiovascular: Negative.  Negative for chest pain and leg swelling.  Gastrointestinal: Negative.  Negative for abdominal pain.  Genitourinary: Negative.   Musculoskeletal: Negative.   Neurological: Negative.  Negative for sensory change and weakness.  Psychiatric/Behavioral: Positive for substance abuse. The patient has insomnia.     As per HPI. Otherwise, a complete review of systems is negative.  PAST MEDICAL HISTORY: Past Medical History:  Diagnosis Date  . Alcohol abuse   . Cirrhosis ([place])   . COPD (chronic obstructive pulmonary disease) ([place])     PAST SURGICAL HISTORY: Past Surgical History:  Procedure Laterality Date  . TONSILLECTOMY     age was 89  . VASECTOMY      FAMILY HISTORY: Family History  Problem  Relation Age of Onset  . Breast cancer Mother     ADVANCED DIRECTIVES (Y/N):  N  HEALTH MAINTENANCE: Social History  Substance Use Topics  . Smoking status: Current Every Day Smoker    Packs/day: 0.50    Types: Cigarettes  . Smokeless tobacco: Never Used  . Alcohol use Yes     Colonoscopy:  PAP:  Bone density:  Lipid panel:  Allergies  Allergen Reactions  . Penicillins Other (See Comments)    unknown    Current Outpatient Prescriptions  Medication Sig Dispense Refill  . ADVAIR DISKUS 250-50 MCG/DOSE AEPB Inhale 2 puffs into the lungs daily.    Marland Kitchen albuterol (PROVENTIL HFA;VENTOLIN HFA) 108 (90 Base) MCG/ACT inhaler Inhale 2 puffs into the lungs every 6 (six) hours as needed for wheezing or shortness of breath.    Marland Kitchen alendronate (FOSAMAX) 70 MG tablet Take 70 mg by mouth once a week.     . cholecalciferol (VITAMIN D) 1000 units tablet Take 5,000 Units by mouth daily.    Marland Kitchen donepezil (ARICEPT) 5 MG tablet     . folic acid (FOLVITE) 1 MG tablet Take 1 mg by mouth daily.    . furosemide (LASIX) 20 MG tablet Take 20 mg by mouth daily.     Marland Kitchen lactulose (CHRONULAC) 10 GM/15ML solution Take 20 g by mouth 3 (three) times daily.    . Ledipasvir-Sofosbuvir (HARVONI) 90-400 MG TABS Take 1 tablet by mouth daily. 12 weeks,    . LORazepam (ATIVAN) 0.5 MG tablet Take 1 mg by mouth at bedtime.     . magic mouthwash w/lidocaine SOLN Take 5 mLs by mouth  3 (three) times daily as needed for mouth pain. 300 mL 0  . magnesium oxide (MAG-OX) 400 MG tablet Take 800 mg by mouth 3 (three) times daily.     . Melatonin 3 MG TABS Take 6 mg by mouth at bedtime.    . meloxicam (MOBIC) 7.5 MG tablet Take 7.5 mg by mouth daily.     . Multiple Vitamins-Minerals (MULTIVITAMIN WITH MINERALS) tablet Take 1 tablet by mouth daily.    . nadolol (CORGARD) 20 MG tablet Take 10 mg by mouth daily.     . ondansetron (ZOFRAN) 8 MG tablet Take 1 tablet (8 mg total) by mouth 2 (two) times daily as needed. 30 tablet 1  .  polyethylene glycol (MIRALAX / GLYCOLAX) packet Take 17 g by mouth daily.    . potassium chloride (K-DUR) 10 MEQ tablet Take 20 mEq by mouth daily.     . prochlorperazine (COMPAZINE) 10 MG tablet Take 1 tablet (10 mg total) by mouth every 6 (six) hours as needed (Nausea or vomiting). 30 tablet 1  . QUEtiapine (SEROQUEL) 50 MG tablet Take 100 mg by mouth at bedtime.     Marland Kitchen SPIRIVA HANDIHALER 18 MCG inhalation capsule Place 18 mcg into inhaler and inhale daily.     Marland Kitchen spironolactone (ALDACTONE) 50 MG tablet Take 50 mg by mouth daily.     . sucralfate (CARAFATE) 1 g tablet Take 1 tablet (1 g total) by mouth 3 (three) times daily. Dissolve in 4 tbs warm water, swish and swallow 90 tablet 2  . tamsulosin (FLOMAX) 0.4 MG CAPS capsule Take 0.4 mg by mouth 2 (two) times daily.     Marland Kitchen thiamine 100 MG tablet Take 100 mg by mouth daily.    . traZODone (DESYREL) 50 MG tablet Take 50 mg by mouth at bedtime.    Marland Kitchen venlafaxine XR (EFFEXOR-XR) 150 MG 24 hr capsule Take 150 mg by mouth.     . vitamin B-12 (CYANOCOBALAMIN) 1000 MCG tablet Take 1,000 mcg by mouth daily.     No current facility-administered medications for this visit.     OBJECTIVE: Vitals:   04/23/16 1037  BP: 139/88  Pulse: 60  Resp: 18  Temp: 97.1 F (36.2 C)     Body mass index is 19.71 kg/m.    ECOG FS:0 - Asymptomatic  General: Well-developed, well-nourished, no acute distress. Eyes: Pink conjunctiva, anicteric sclera. HEENT: Normocephalic, moist mucous membranes, clear oropharnyx. Easily palpable lymphadenopathy in right cervical chain, improved. Lungs: Clear to auscultation bilaterally. Heart: Regular rate and rhythm. No rubs, murmurs, or gallops. Abdomen: Soft, nontender, nondistended. No organomegaly noted, normoactive bowel sounds. Musculoskeletal: No edema, cyanosis, or clubbing. Neuro: Alert, answering all questions appropriately. Cranial nerves grossly intact. Skin: No rashes or petechiae noted. Psych: Normal  affect.   LAB RESULTS:  Lab Results  Component Value Date   NA 133 (L) 04/23/2016   K 4.1 04/23/2016   CL 98 (L) 04/23/2016   CO2 27 04/23/2016   GLUCOSE 118 (H) 04/23/2016   BUN 33 (H) 04/23/2016   CREATININE 0.96 04/23/2016   CALCIUM 9.6 04/23/2016   PROT 6.7 11/18/2015   ALBUMIN 3.0 (L) 11/18/2015   AST 125 (H) 11/18/2015   ALT 53 11/18/2015   ALKPHOS 96 11/18/2015   BILITOT 0.5 11/18/2015   GFRNONAA >60 04/23/2016   GFRAA >60 04/23/2016    Lab Results  Component Value Date   WBC 5.0 04/23/2016   NEUTROABS 2.8 04/23/2016   HGB 12.1 (L) 04/23/2016  HCT 34.6 (L) 04/23/2016   MCV 97.4 04/23/2016   PLT 133 (L) 04/23/2016     STUDIES: No results found.  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  PLAN:    1. 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 Given patient's history of noncompliance, will not pursue induction chemotherapy and proceed with concurrent radiation therapy and weekly chemotherapy using cisplatin. Continue daily XRT completing on May 14, 2016. Proceed with cycle 5 of weekly cisplatin. Return to clinic in 1 week for consideration of cycle 6.  2. Social situation: Because patient is a ward of the state, he will require his power of attorney to be present for any further consents. 3. Smoking cessation: Although he acknowledges the risks, patient has declined to discontinue tobacco at this time. 4. Dysphagia/mouth sores: Continue Magic mouthwash and Carafate as indicated.  5. Insomnia: Although the patient reports he does not sleep, his caretaker states he sleeps adequately.  Patient expressed understanding and was in agreement with this plan. He also understands that He can call clinic at any time with any questions, concerns, or complaints.    Cancer Staging Primary cancer of larynx [facility]) Staging form: Larynx - Supraglottis, AJCC  8th Edition - Clinical stage from 02/27/2016: Stage IVA (cT1, cN2b, cM0) - Signed by Lloyd Huger, MD on 02/27/2016   Lloyd Huger, MD   04/26/2016 7:08 PM

## 2016-04-23 ENCOUNTER — Inpatient Hospital Stay (HOSPITAL_BASED_OUTPATIENT_CLINIC_OR_DEPARTMENT_OTHER): Payer: Medicare Other | Admitting: Oncology

## 2016-04-23 ENCOUNTER — Inpatient Hospital Stay: Payer: Medicare Other

## 2016-04-23 ENCOUNTER — Ambulatory Visit
Admission: RE | Admit: 2016-04-23 | Discharge: 2016-04-23 | Disposition: A | Discharge status: Home / Self Care | Payer: Medicare Other | Source: Ambulatory Visit | Attending: Radiation Oncology | Admitting: Radiation Oncology

## 2016-04-23 VITALS — BP 139/88 | HR 60 | Temp 97.1°F | Resp 18 | Wt 122.1 lb

## 2016-04-23 DIAGNOSIS — K7030 Alcoholic cirrhosis of liver without ascites: Secondary | ICD-10-CM | POA: Diagnosis not present

## 2016-04-23 DIAGNOSIS — Z803 Family history of malignant neoplasm of breast: Secondary | ICD-10-CM | POA: Diagnosis not present

## 2016-04-23 DIAGNOSIS — Z9119 Patient's noncompliance with other medical treatment and regimen: Secondary | ICD-10-CM

## 2016-04-23 DIAGNOSIS — C321 Malignant neoplasm of supraglottis: Secondary | ICD-10-CM | POA: Diagnosis not present

## 2016-04-23 DIAGNOSIS — J449 Chronic obstructive pulmonary disease, unspecified: Secondary | ICD-10-CM | POA: Diagnosis not present

## 2016-04-23 DIAGNOSIS — Z5111 Encounter for antineoplastic chemotherapy: Secondary | ICD-10-CM | POA: Diagnosis not present

## 2016-04-23 DIAGNOSIS — R590 Localized enlarged lymph nodes: Secondary | ICD-10-CM | POA: Diagnosis not present

## 2016-04-23 DIAGNOSIS — R1310 Dysphagia, unspecified: Secondary | ICD-10-CM | POA: Diagnosis not present

## 2016-04-23 DIAGNOSIS — Z880 Allergy status to penicillin: Secondary | ICD-10-CM | POA: Diagnosis not present

## 2016-04-23 DIAGNOSIS — C329 Malignant neoplasm of larynx, unspecified: Secondary | ICD-10-CM

## 2016-04-23 DIAGNOSIS — G4700 Insomnia, unspecified: Secondary | ICD-10-CM

## 2016-04-23 DIAGNOSIS — Z79899 Other long term (current) drug therapy: Secondary | ICD-10-CM | POA: Diagnosis not present

## 2016-04-23 DIAGNOSIS — F17210 Nicotine dependence, cigarettes, uncomplicated: Secondary | ICD-10-CM

## 2016-04-23 DIAGNOSIS — Z510 Encounter for antineoplastic radiation therapy: Secondary | ICD-10-CM | POA: Diagnosis not present

## 2016-04-23 LAB — CBC WITH DIFFERENTIAL/PLATELET
BASOS ABS: 0 10*3/uL (ref 0–0.1)
Basophils Relative: 0 %
EOS ABS: 0 10*3/uL (ref 0–0.7)
EOS PCT: 0 %
HCT: 34.6 % — ABNORMAL LOW (ref 40.0–52.0)
Hemoglobin: 12.1 g/dL — ABNORMAL LOW (ref 13.0–18.0)
LYMPHS ABS: 1.4 10*3/uL (ref 1.0–3.6)
Lymphocytes Relative: 28 %
MCH: 34.2 pg — AB (ref 26.0–34.0)
MCHC: 35.1 g/dL (ref 32.0–36.0)
MCV: 97.4 fL (ref 80.0–100.0)
MONO ABS: 0.8 10*3/uL (ref 0.2–1.0)
Monocytes Relative: 16 %
Neutro Abs: 2.8 10*3/uL (ref 1.4–6.5)
Neutrophils Relative %: 56 %
PLATELETS: 133 10*3/uL — AB (ref 150–440)
RBC: 3.55 MIL/uL — AB (ref 4.40–5.90)
RDW: 14.2 % (ref 11.5–14.5)
WBC: 5 10*3/uL (ref 3.8–10.6)

## 2016-04-23 LAB — BASIC METABOLIC PANEL
Anion gap: 8 (ref 5–15)
BUN: 33 mg/dL — AB (ref 6–20)
CALCIUM: 9.6 mg/dL (ref 8.9–10.3)
CO2: 27 mmol/L (ref 22–32)
Chloride: 98 mmol/L — ABNORMAL LOW (ref 101–111)
Creatinine, Ser: 0.96 mg/dL (ref 0.61–1.24)
GFR calc Af Amer: >60 mL/min (ref >60)
GFR calc non Af Amer: >60 mL/min (ref >60)
GLUCOSE: 118 mg/dL — AB (ref 65–99)
POTASSIUM: 4.1 mmol/L (ref 3.5–5.1)
SODIUM: 133 mmol/L — AB (ref 135–145)

## 2016-04-23 MED ORDER — SODIUM CHLORIDE 0.9 % IV SOLN
Freq: Once | INTRAVENOUS | Status: AC
  Administered 2016-04-23: 11:00:00 via INTRAVENOUS
  Filled 2016-04-23: qty 1000

## 2016-04-23 MED ORDER — POTASSIUM CHLORIDE 2 MEQ/ML IV SOLN
Freq: Once | INTRAVENOUS | Status: AC
  Administered 2016-04-23: 12:00:00 via INTRAVENOUS
  Filled 2016-04-23: qty 1000

## 2016-04-23 MED ORDER — SODIUM CHLORIDE 0.9 % IV SOLN
40.0000 mg/m2 | Freq: Once | INTRAVENOUS | Status: AC
  Administered 2016-04-23: 67 mg via INTRAVENOUS
  Filled 2016-04-23: qty 67

## 2016-04-23 MED ORDER — PALONOSETRON HCL INJECTION 0.25 MG/5ML
0.2500 mg | Freq: Once | INTRAVENOUS | Status: AC
  Administered 2016-04-23: 0.25 mg via INTRAVENOUS
  Filled 2016-04-23: qty 5

## 2016-04-23 MED ORDER — SODIUM CHLORIDE 0.9 % IV SOLN
Freq: Once | INTRAVENOUS | Status: AC
  Administered 2016-04-23: 14:00:00 via INTRAVENOUS
  Filled 2016-04-23: qty 5

## 2016-04-23 NOTE — Progress Notes (Addendum)
Nutrition Follow-up:  Nutrition follow-up completed during infusion this am. Patient receiving chemotherapy and radiation therapy.  Noted patient to see SLP on 3/14.    Patient reports throat is sore and scratchy.  Patient continues to report that "I am eating everything they give me."  Reports that he is drinking boost plus "everytime they give it to me."  Unable to tell me how often this is during the day.     Medications: magic mouthwash with lidocaine, carafate, MVI, miralax  Labs: Na 133, glucose 118, BUN 33, creatinine WDL  Anthropometrics:   Patient weight is 122 pounds today, decreased from 125 pounds on 3/1.    NUTRITION DIAGNOSIS: Malnutrition continues   MALNUTRITION DIAGNOSIS: Severe malnutrition continues   INTERVENTION:   Continue to encouraged intake of soft, liquid foods.  Spoke with caregiver Aniceto Boss last week via phone. Ernestene Kiel care home owner never called this Probation officer back after message left last week.   Recommend increasing boost plus to 4 times per day.  MD Grayland Ormond agreeable to rewriting prescription and nurse to send to care home.     Discussed continued weight loss with MD Grayland Ormond and recommendation to maximize nutrition, feeding tube would be next steps.   MONITORING, EVALUATION, GOAL: Patient will consume adequate calories and protein to prevent further weight loss.   NEXT VISIT: March 15 during infusion  Jessicia Napolitano B. Zenia Resides, [place], [place] Registered Dietitian [phone] (pager)

## 2016-04-23 NOTE — Progress Notes (Signed)
Complains of difficulty swallowing despite taking magic mouthwash and carafate.

## 2016-04-24 ENCOUNTER — Ambulatory Visit
Admission: RE | Admit: 2016-04-24 | Discharge: 2016-04-24 | Disposition: A | Discharge status: Home / Self Care | Payer: Medicare Other | Source: Ambulatory Visit | Attending: Radiation Oncology | Admitting: Radiation Oncology

## 2016-04-24 DIAGNOSIS — Z510 Encounter for antineoplastic radiation therapy: Secondary | ICD-10-CM | POA: Diagnosis not present

## 2016-04-27 ENCOUNTER — Ambulatory Visit
Admission: RE | Admit: 2016-04-27 | Discharge: 2016-04-27 | Disposition: A | Discharge status: Home / Self Care | Payer: Medicare Other | Source: Ambulatory Visit | Attending: Radiation Oncology | Admitting: Radiation Oncology

## 2016-04-27 DIAGNOSIS — Z510 Encounter for antineoplastic radiation therapy: Secondary | ICD-10-CM | POA: Diagnosis not present

## 2016-04-28 ENCOUNTER — Ambulatory Visit
Admission: RE | Admit: 2016-04-28 | Discharge: 2016-04-28 | Disposition: A | Discharge status: Home / Self Care | Payer: Medicare Other | Source: Ambulatory Visit | Attending: Radiation Oncology | Admitting: Radiation Oncology

## 2016-04-28 DIAGNOSIS — Z510 Encounter for antineoplastic radiation therapy: Secondary | ICD-10-CM | POA: Diagnosis not present

## 2016-04-29 ENCOUNTER — Ambulatory Visit
Admission: RE | Admit: 2016-04-29 | Discharge: 2016-04-29 | Disposition: A | Discharge status: Home / Self Care | Payer: Medicare Other | Source: Ambulatory Visit | Attending: Oncology | Admitting: Oncology

## 2016-04-29 ENCOUNTER — Ambulatory Visit: Payer: Medicare Other

## 2016-04-29 ENCOUNTER — Other Ambulatory Visit: Payer: Self-pay | Admitting: *Deleted

## 2016-04-29 DIAGNOSIS — Z923 Personal history of irradiation: Secondary | ICD-10-CM | POA: Insufficient documentation

## 2016-04-29 DIAGNOSIS — C329 Malignant neoplasm of larynx, unspecified: Secondary | ICD-10-CM | POA: Insufficient documentation

## 2016-04-29 DIAGNOSIS — R1312 Dysphagia, oropharyngeal phase: Secondary | ICD-10-CM

## 2016-04-29 NOTE — Progress Notes (Deleted)
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 He is tolerating his treatments well without significant side effects. He continues to smoke heavily. He has no neurologic complaints. He denies any recent fevers or illnesses. He does admit to increasing dysphasia which is only mildly helped with Magic mouthwash and Carafate.  He has a fair appetite, but denies weight loss. He has no chest pain or shortness of breath. He denies any nausea, vomiting, constipation, or diarrhea. He has no urinary complaints. Patient offers no further specific complaints.  REVIEW OF SYSTEMS:   Review of Systems  Constitutional: Negative.  Negative for fever, malaise/fatigue and weight loss.  HENT: Positive for sore throat.   Respiratory: Negative.  Negative for cough and shortness of breath.   Cardiovascular: Negative.  Negative for chest pain and leg swelling.  Gastrointestinal: Negative.  Negative for abdominal pain.  Genitourinary: Negative.   Musculoskeletal: Negative.   Neurological: Negative.  Negative for sensory change and weakness.  Psychiatric/Behavioral: Positive for substance abuse. The patient has insomnia.     As per HPI. Otherwise, a complete review of systems is negative.  PAST MEDICAL HISTORY: Past Medical History:  Diagnosis Date  . Alcohol abuse   . Cirrhosis ([place])   . COPD (chronic obstructive pulmonary disease) ([place])     PAST SURGICAL HISTORY: Past Surgical History:  Procedure Laterality Date  . TONSILLECTOMY     age was 82  . VASECTOMY      FAMILY HISTORY: Family History  Problem Relation Age of Onset    . Breast cancer Mother     ADVANCED DIRECTIVES (Y/N):  N  HEALTH MAINTENANCE: Social History  Substance Use Topics  . Smoking status: Current Every Day Smoker    Packs/day: 0.50    Types: Cigarettes  . Smokeless tobacco: Never Used  . Alcohol use Yes     Colonoscopy:  PAP:  Bone density:  Lipid panel:  Allergies  Allergen Reactions  . Penicillins Other (See Comments)    unknown    Current Outpatient Prescriptions  Medication Sig Dispense Refill  . ADVAIR DISKUS 250-50 MCG/DOSE AEPB Inhale 2 puffs into the lungs daily.    Marland Kitchen albuterol (PROVENTIL HFA;VENTOLIN HFA) 108 (90 Base) MCG/ACT inhaler Inhale 2 puffs into the lungs every 6 (six) hours as needed for wheezing or shortness of breath.    Marland Kitchen alendronate (FOSAMAX) 70 MG tablet Take 70 mg by mouth once a week.     . cholecalciferol (VITAMIN D) 1000 units tablet Take 5,000 Units by mouth daily.    Marland Kitchen donepezil (ARICEPT) 5 MG tablet     . folic acid (FOLVITE) 1 MG tablet Take 1 mg by mouth daily.    . furosemide (LASIX) 20 MG tablet Take 20 mg by mouth daily.     Marland Kitchen lactulose (CHRONULAC) 10 GM/15ML solution Take 20 g by mouth 3 (three) times daily.    . Ledipasvir-Sofosbuvir (HARVONI) 90-400 MG TABS Take 1 tablet by mouth daily. 12 weeks,    . LORazepam (ATIVAN) 0.5 MG tablet Take 1 mg by mouth at bedtime.     . magic mouthwash w/lidocaine SOLN Take 5 mLs by mouth 3 (  three) times daily as needed for mouth pain. 300 mL 0  . magnesium oxide (MAG-OX) 400 MG tablet Take 800 mg by mouth 3 (three) times daily.     . Melatonin 3 MG TABS Take 6 mg by mouth at bedtime.    . meloxicam (MOBIC) 7.5 MG tablet Take 7.5 mg by mouth daily.     . Multiple Vitamins-Minerals (MULTIVITAMIN WITH MINERALS) tablet Take 1 tablet by mouth daily.    . nadolol (CORGARD) 20 MG tablet Take 10 mg by mouth daily.     . ondansetron (ZOFRAN) 8 MG tablet Take 1 tablet (8 mg total) by mouth 2 (two) times daily as needed. 30 tablet 1  . polyethylene glycol  (MIRALAX / GLYCOLAX) packet Take 17 g by mouth daily.    . potassium chloride (K-DUR) 10 MEQ tablet Take 20 mEq by mouth daily.     . prochlorperazine (COMPAZINE) 10 MG tablet Take 1 tablet (10 mg total) by mouth every 6 (six) hours as needed (Nausea or vomiting). 30 tablet 1  . QUEtiapine (SEROQUEL) 50 MG tablet Take 100 mg by mouth at bedtime.     Marland Kitchen SPIRIVA HANDIHALER 18 MCG inhalation capsule Place 18 mcg into inhaler and inhale daily.     Marland Kitchen spironolactone (ALDACTONE) 50 MG tablet Take 50 mg by mouth daily.     . sucralfate (CARAFATE) 1 g tablet Take 1 tablet (1 g total) by mouth 3 (three) times daily. Dissolve in 4 tbs warm water, swish and swallow 90 tablet 2  . tamsulosin (FLOMAX) 0.4 MG CAPS capsule Take 0.4 mg by mouth 2 (two) times daily.     Marland Kitchen thiamine 100 MG tablet Take 100 mg by mouth daily.    . traZODone (DESYREL) 50 MG tablet Take 50 mg by mouth at bedtime.    Marland Kitchen venlafaxine XR (EFFEXOR-XR) 150 MG 24 hr capsule Take 150 mg by mouth.     . vitamin B-12 (CYANOCOBALAMIN) 1000 MCG tablet Take 1,000 mcg by mouth daily.     No current facility-administered medications for this visit.     OBJECTIVE: There were no vitals filed for this visit.   There is no height or weight on file to calculate BMI.    ECOG FS:0 - Asymptomatic  General: Well-developed, well-nourished, no acute distress. Eyes: Pink conjunctiva, anicteric sclera. HEENT: Normocephalic, moist mucous membranes, clear oropharnyx. Easily palpable lymphadenopathy in right cervical chain, improved. Lungs: Clear to auscultation bilaterally. Heart: Regular rate and rhythm. No rubs, murmurs, or gallops. Abdomen: Soft, nontender, nondistended. No organomegaly noted, normoactive bowel sounds. Musculoskeletal: No edema, cyanosis, or clubbing. Neuro: Alert, answering all questions appropriately. Cranial nerves grossly intact. Skin: No rashes or petechiae noted. Psych: Normal affect.   LAB RESULTS:  Lab Results  Component Value  Date   NA 133 (L) 04/23/2016   K 4.1 04/23/2016   CL 98 (L) 04/23/2016   CO2 27 04/23/2016   GLUCOSE 118 (H) 04/23/2016   BUN 33 (H) 04/23/2016   CREATININE 0.96 04/23/2016   CALCIUM 9.6 04/23/2016   PROT 6.7 11/18/2015   ALBUMIN 3.0 (L) 11/18/2015   AST 125 (H) 11/18/2015   ALT 53 11/18/2015   ALKPHOS 96 11/18/2015   BILITOT 0.5 11/18/2015   GFRNONAA >60 04/23/2016   GFRAA >60 04/23/2016    Lab Results  Component Value Date   WBC 5.0 04/23/2016   NEUTROABS 2.8 04/23/2016   HGB 12.1 (L) 04/23/2016   HCT 34.6 (L) 04/23/2016   MCV 97.4 04/23/2016  PLT 133 (L) 04/23/2016     STUDIES: Dg Op Swallowing Func-medicare/speech Path  Result Date: 04/29/2016 CLINICAL DATA:  Trouble swallowing for the past 6 months. History of laryngeal cancer. EXAM: MODIFIED BARIUM SWALLOW TECHNIQUE: Different consistencies of barium were administered orally to the patient by the Speech Pathologist. Imaging of the pharynx was performed in the lateral projection. FLUOROSCOPY TIME:  Fluoroscopy Time:  1 minutes 18 seconds Radiation Exposure Index (if provided by the fluoroscopic device): 0.5 mGy Number of Acquired Spot Images: 0 COMPARISON:  None. FINDINGS: Thin liquid- Mild vallecular residuals. Laryngeal flash penetration without aspiration. Otherwise within normal limits Nectar thick liquid- Mild vallecular residuals. Otherwise within normal limits Pure- Difficulty initiating swallow. Vallecular residuals. Otherwise within normal limits Pure with cracker- Difficulty initiating swallow. Vallecular residuals. Otherwise within normal limits IMPRESSION: Modified barium swallow as described above. Please refer to the Speech Pathologists report for complete details and recommendations. 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  PLAN:    1. 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 Given patient's history of noncompliance, will not pursue induction chemotherapy and proceed with concurrent radiation therapy and weekly chemotherapy using cisplatin. Continue daily XRT completing on May 14, 2016. Proceed with cycle 5 of weekly cisplatin. Return to clinic in 1 week for consideration of cycle 6.  2. Social situation: Because patient is a ward of the state, he will require his power of attorney to be present for any further consents. 3. Smoking cessation: Although he acknowledges the risks, patient has declined to discontinue tobacco at this time. 4. Dysphagia/mouth sores: Continue Magic mouthwash and Carafate as indicated.  5. Insomnia: Although the patient reports he does not sleep, his caretaker states he sleeps adequately.  Patient expressed understanding and was in agreement with this plan. He also understands that He can call clinic at any time with any questions, concerns, or complaints.    Cancer Staging Primary cancer of larynx [facility]) Staging form: Larynx - Supraglottis, AJCC 8th Edition - Clinical stage from 02/27/2016: Stage IVA (cT1, cN2b, cM0) - Signed by Lloyd Huger, MD on 02/27/2016   Lloyd Huger, MD   04/29/2016 9:20 PM

## 2016-04-29 NOTE — Therapy (Signed)
[place], [place], [address] Phone: [phone]   Fax:     Modified Barium Swallow  Patient Details  Name: [name] MRN: [id number] Date of Birth: [birth year] No Data Recorded  Encounter Date: 04/29/2016      End of Session - 04/29/16 1342    Visit Number 1   Number of Visits 1   Date for SLP Re-Evaluation 04/29/16   SLP Start Time 40   SLP Stop Time  1342   SLP Time Calculation (min) 42 min   Activity Tolerance Patient tolerated treatment well      Past Medical History:  Diagnosis Date  . Alcohol abuse   . Cirrhosis ([place])   . COPD (chronic obstructive pulmonary disease) ([place])     Past Surgical History:  Procedure Laterality Date  . TONSILLECTOMY     age was 53  . VASECTOMY      There were no vitals filed for this visit.     Subjective: Patient behavior: (alertness, ability to follow instructions, etc.): Patient is able to follow directions for this evaluation.  Per records, patient is noncompliant with medical recommendations at times.  Chief complaint" "My throat is right swollen"  Patient denies choking or coughing with swallowing   Objective:  Radiological Procedure: A videoflouroscopic evaluation of oral-preparatory, reflex initiation, and pharyngeal phases of the swallow was performed; as well as a screening of the upper esophageal phase.  I. POSTURE: Upright in MBS chair  II. VIEW: Lateral  III. COMPENSATORY STRATEGIES: N/A  IV. BOLUSES ADMINISTERED:   Thin Liquid: 2 small, 3 rapid consecutive   Nectar-thick Liquid: 1 moderate    Puree: 2 teaspoon presentations   Mechanical Soft: 1/4 graham cracker in applesauce  V. RESULTS OF EVALUATION: A. ORAL PREPARATORY PHASE: (The lips, tongue, and velum are observed for strength and coordination)       **Overall Severity Rating: Within normal limits  B. SWALLOW INITIATION/REFLEX: (The reflex is normal if  "triggered" by the time the bolus reached the base of the tongue)  **Overall Severity Rating: Mild-moderate; triggers while falling from the valleculae to the pyriform sinuses  C. PHARYNGEAL PHASE: (Pharyngeal function is normal if the bolus shows rapid, smooth, and continuous transit through the pharynx and there is no pharyngeal residue after the swallow)  **Overall Severity Rating: Mild; reduced tongue base retraction, incomplete epiglottic inversion, mild vallecular residue  D. LARYNGEAL PENETRATION: (Material entering into the laryngeal inlet/vestibule but not aspirated) Transient X1 during rapid consecutive swallows  E. ASPIRATION: None  F. ESOPHAGEAL PHASE: (Screening of the upper esophagus): difficult to see due to shoulder shadow but appears to back up (consistent with poor mobility) and aerophagia.  He is at risk for GERD with his history of alcoholism.  ASSESSMENT: This 71 year old man; with cancer of the right larynx, receiving chemoradiation, and subjective complaint of "throat is right swollen"; is presenting with mild oropharyngeal dysphagia characterized by delayed pharyngeal swallow initiation, reduced tongue base retraction, incomplete epiglottic inversion, xerostomia with foods/liquid adhering to mucus membranes, mild vallecular residue, and one episode of transient laryngeal penetration (without aspiration) while gulping thin liquid.  The patient's current symptoms could be accounted for by effects of his current treatment and GERD, if he has reflux.  The patient is currently safe for his oral diet and to continue swallowing will be the best treatment for maintaining his current status.  He is at risk for late effects of radiation on swallowing  musculature over time.  PLAN/RECOMMENDATIONS:   A. Diet: Regular as tolerated, he is edentulous   B. Swallowing Precautions: Encourage small bites/sips, reflux precautions, double swallows    C. Recommended consultation to: dietician to  maximize nutrition   D. Therapy recommendations: not at this time, please re-consult if patient exhibits symptoms of aspiration   E. Results and recommendations were discussed with the patient immediately following the study and the final report routed to the referring MD.    Dysphagia, oropharyngeal phase  Primary cancer of larynx ([place]) - Plan: DG OP Swallowing Func-Medicare/Speech Path, DG OP Swallowing Func-Medicare/Speech Path      G-Codes - May 20, 2016 1343    Functional Assessment Tool Used MBSclinical judgmentS,    Functional Limitations Swallowing   Swallow Current Status (N1833) At least 20 percent but less than 40 percent impaired, limited or restricted   Swallow Goal Status (P8251) At least 20 percent but less than 40 percent impaired, limited or restricted   Swallow Discharge Status [phone]) At least 20 percent but less than 40 percent impaired, limited or restricted          Problem List Patient Active Problem List   Diagnosis Date Noted  . Goals of care, counseling/discussion 03/01/2016  . Primary cancer of larynx ([place]) 02/19/2016  . Alcohol abuse 11/18/2015  . Dementia with behavioral disturbance 10/21/2015   Leroy Sea, MS/CCC- SLP  Lou Miner 05-20-16, 1:44 PM  [facility] [place], [place], [address] Phone: [phone]   Fax:     Name: [name] MRN: [id number] Date of Birth: [birth year]

## 2016-04-30 ENCOUNTER — Inpatient Hospital Stay: Payer: Medicare Other | Admitting: Oncology

## 2016-04-30 ENCOUNTER — Ambulatory Visit: Payer: Medicare Other

## 2016-04-30 ENCOUNTER — Inpatient Hospital Stay: Payer: Medicare Other

## 2016-05-01 ENCOUNTER — Ambulatory Visit: Payer: Medicare Other

## 2016-05-02 ENCOUNTER — Encounter: Payer: Self-pay | Admitting: *Deleted

## 2016-05-02 ENCOUNTER — Emergency Department
Admission: EM | Admit: 2016-05-02 | Discharge: 2016-05-02 | Disposition: A | Discharge status: Home / Self Care | Payer: Medicare Other | Attending: Emergency Medicine | Admitting: Emergency Medicine

## 2016-05-02 DIAGNOSIS — Z79899 Other long term (current) drug therapy: Secondary | ICD-10-CM | POA: Insufficient documentation

## 2016-05-02 DIAGNOSIS — F17210 Nicotine dependence, cigarettes, uncomplicated: Secondary | ICD-10-CM | POA: Diagnosis not present

## 2016-05-02 DIAGNOSIS — Z85819 Personal history of malignant neoplasm of unspecified site of lip, oral cavity, and pharynx: Secondary | ICD-10-CM | POA: Insufficient documentation

## 2016-05-02 DIAGNOSIS — J449 Chronic obstructive pulmonary disease, unspecified: Secondary | ICD-10-CM | POA: Diagnosis not present

## 2016-05-02 DIAGNOSIS — R070 Pain in throat: Secondary | ICD-10-CM | POA: Diagnosis present

## 2016-05-02 MED ORDER — MORPHINE SULFATE 10 MG/5ML PO SOLN
5.0000 mg | Freq: Once | ORAL | Status: AC
  Administered 2016-05-02: 5 mg via ORAL
  Filled 2016-05-02: qty 4

## 2016-05-02 MED ORDER — MORPHINE SULFATE 20 MG/5ML PO SOLN: 5.0000 mg | ORAL | 0 refills | Status: AC | PRN

## 2016-05-02 NOTE — ED Triage Notes (Signed)
Pt receiving radiation and chemotherapy for cancer in his throat. Pt c/o recent increase in R sided throat pain and difficulty swallowing over past 1 week. Pt states he told his [place] MD and is receiving pain meds, but they are not working and his pain was worse tonight.

## 2016-05-02 NOTE — Discharge Instructions (Signed)
Please take your morphine only as needed for pain control. Please follow-up with your primary care physician on Monday for recheck. Return to the emergency department sooner for any new or worsening symptoms such as if you cannot eat or drink, for worsening pain, or for any other concerns.  It was a pleasure to take care of you today, and thank you for coming to our emergency department.  If you have any questions or concerns before leaving please ask the nurse to grab me and I'm more than happy to go through your aftercare instructions again.  If you were prescribed any opioid pain medication today such as Norco, Vicodin, Percocet, morphine, hydrocodone, or oxycodone please make sure you do not drive when you are taking this medication as it can alter your ability to drive safely.  If you have any concerns once you are home that you are not improving or are in fact getting worse before you can make it to your follow-up appointment, please do not hesitate to call 911 and come back for further evaluation.  Darel Hong MD  Results for orders placed or performed in visit on 04/23/16  CBC with Differential  Result Value Ref Range   WBC 5.0 3.8 - 10.6 K/uL   RBC 3.55 (L) 4.40 - 5.90 MIL/uL   Hemoglobin 12.1 (L) 13.0 - 18.0 g/dL   HCT 34.6 (L) 40.0 - 52.0 %   MCV 97.4 80.0 - 100.0 fL   MCH 34.2 (H) 26.0 - 34.0 pg   MCHC 35.1 32.0 - 36.0 g/dL   RDW 14.2 11.5 - 14.5 %   Platelets 133 (L) 150 - 440 K/uL   Neutrophils Relative % 56 %   Neutro Abs 2.8 1.4 - 6.5 K/uL   Lymphocytes Relative 28 %   Lymphs Abs 1.4 1.0 - 3.6 K/uL   Monocytes Relative 16 %   Monocytes Absolute 0.8 0.2 - 1.0 K/uL   Eosinophils Relative 0 %   Eosinophils Absolute 0.0 0 - 0.7 K/uL   Basophils Relative 0 %   Basophils Absolute 0.0 0 - 0.1 K/uL  Basic metabolic panel  Result Value Ref Range   Sodium 133 (L) 135 - 145 mmol/L   Potassium 4.1 3.5 - 5.1 mmol/L   Chloride 98 (L) 101 - 111 mmol/L   CO2 27 22 - 32 mmol/L     Glucose, Bld 118 (H) 65 - 99 mg/dL   BUN 33 (H) 6 - 20 mg/dL   Creatinine, Ser 0.96 0.61 - 1.24 mg/dL   Calcium 9.6 8.9 - 10.3 mg/dL   GFR calc non Af Amer >60 >60 mL/min   GFR calc Af Amer >60 >60 mL/min   Anion gap 8 5 - 15   Dg Op Swallowing Func-medicare/speech Path  Result Date: 04/29/2016 CLINICAL DATA:  Trouble swallowing for the past 6 months. History of laryngeal cancer. EXAM: MODIFIED BARIUM SWALLOW TECHNIQUE: Different consistencies of barium were administered orally to the patient by the Speech Pathologist. Imaging of the pharynx was performed in the lateral projection. FLUOROSCOPY TIME:  Fluoroscopy Time:  1 minutes 18 seconds Radiation Exposure Index (if provided by the fluoroscopic device): 0.5 mGy Number of Acquired Spot Images: 0 COMPARISON:  None. FINDINGS: Thin liquid- Mild vallecular residuals. Laryngeal flash penetration without aspiration. Otherwise within normal limits Nectar thick liquid- Mild vallecular residuals. Otherwise within normal limits Pure- Difficulty initiating swallow. Vallecular residuals. Otherwise within normal limits Pure with cracker- Difficulty initiating swallow. Vallecular residuals. Otherwise within normal limits IMPRESSION: Modified  barium swallow as described above. Please refer to the Speech Pathologists report for complete details and recommendations. Electronically Signed   By: Kathreen Devoid   On: 04/29/2016 13:43

## 2016-05-02 NOTE — ED Provider Notes (Signed)
[facility] Emergency Department Provider Note  ____________________________________________   First MD Initiated Contact with Patient 05/02/16 0119     (approximate)  I have reviewed the triage vital signs and the nursing notes.   HISTORY  Chief Complaint Sore Throat    HPI [name] is a 71 y.o. male who comes to the emergency department with several weeks of moderate aching right throat pain. 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 He has not received any pain medications from his primary care physician or his oncologist. He denies fevers or chills. He is able to eat and drink without difficulty. He said the pain was bothering him tonight which prompted the visit.The pain is sharp and aching moderate severity nonradiating. Nothing makes it better or worse.   04/29/16 Swallow study:  ASSESSMENT: This 71 year old man; with cancer of the right larynx, receiving chemoradiation, and subjective complaint of "throat is right swollen"; is presenting with mild oropharyngeal dysphagia characterized by delayed pharyngeal swallow initiation, reduced tongue base retraction, incomplete epiglottic inversion, xerostomia with foods/liquid adhering to mucus membranes, mild vallecular residue, and one episode of transient laryngeal penetration (without aspiration) while gulping thin liquid.  The patient's current symptoms could be accounted for by effects of his current treatment and GERD, if he has reflux.  The patient is currently safe for his oral diet and to continue swallowing will be the best treatment for maintaining his current status.  He is at risk for late effects of radiation on swallowing musculature over time.  PLAN/RECOMMENDATIONS:              A. Diet: Regular as tolerated, he is edentulous              B. Swallowing Precautions: Encourage small  bites/sips, reflux precautions, double swallows               C. Recommended consultation to: dietician to maximize nutrition              D. Therapy recommendations: not at this time, please re-consult if patient exhibits symptoms of aspiration              E. Results and recommendations were discussed with the patient immediately following the study and the final report routed to the referring MD.    Dysphagia, oropharyngeal phase   Past Medical History:  Diagnosis Date  . Alcohol abuse   . Cirrhosis ([place])   . COPD (chronic obstructive pulmonary disease) [facility])     Patient Active Problem List   Diagnosis Date Noted  . Goals of care, counseling/discussion 03/01/2016  . Primary cancer of larynx ([place]) 02/19/2016  . Alcohol abuse 11/18/2015  . Dementia with behavioral disturbance 10/21/2015    Past Surgical History:  Procedure Laterality Date  . TONSILLECTOMY     age was 72  . VASECTOMY      Prior to Admission medications   Medication Sig Start Date End Date Taking? Authorizing Provider  ADVAIR DISKUS 250-50 MCG/DOSE AEPB Inhale 2 puffs into the lungs daily. 08/29/15   Historical Provider, MD  albuterol (PROVENTIL HFA;VENTOLIN HFA) 108 (90 Base) MCG/ACT inhaler Inhale 2 puffs into the lungs every 6 (six) hours as needed for wheezing or shortness of breath.    Historical Provider, MD  alendronate (FOSAMAX) 70 MG tablet Take 70 mg by mouth once a week.  09/24/15   Historical  Provider, MD  cholecalciferol (VITAMIN D) 1000 units tablet Take 5,000 Units by mouth daily.    Historical Provider, MD  donepezil (ARICEPT) 5 MG tablet  03/02/16   Historical Provider, MD  folic acid (FOLVITE) 1 MG tablet Take 1 mg by mouth daily.    Historical Provider, MD  furosemide (LASIX) 20 MG tablet Take 20 mg by mouth daily.  10/14/15   Historical Provider, MD  lactulose (CHRONULAC) 10 GM/15ML solution Take 20 g by mouth 3 (three) times daily.    Historical Provider, MD  Ledipasvir-Sofosbuvir  (HARVONI) 90-400 MG TABS Take 1 tablet by mouth daily. 12 weeks,    Historical Provider, MD  LORazepam (ATIVAN) 0.5 MG tablet Take 1 mg by mouth at bedtime.  09/16/15   Historical Provider, MD  magic mouthwash w/lidocaine SOLN Take 5 mLs by mouth 3 (three) times daily as needed for mouth pain. 04/16/16   Lloyd Huger, MD  magnesium oxide (MAG-OX) 400 MG tablet Take 800 mg by mouth 3 (three) times daily.     Historical Provider, MD  Melatonin 3 MG TABS Take 6 mg by mouth at bedtime.    Historical Provider, MD  meloxicam (MOBIC) 7.5 MG tablet Take 7.5 mg by mouth daily.  10/14/15   Historical Provider, MD  morphine 20 MG/5ML solution Take 1.3 mLs (5.2 mg total) by mouth every 4 (four) hours as needed for pain. 05/02/16 05/05/16  Darel Hong, MD  Multiple Vitamins-Minerals (MULTIVITAMIN WITH MINERALS) tablet Take 1 tablet by mouth daily.    Historical Provider, MD  nadolol (CORGARD) 20 MG tablet Take 10 mg by mouth daily.  09/16/15   Historical Provider, MD  ondansetron (ZOFRAN) 8 MG tablet Take 1 tablet (8 mg total) by mouth 2 (two) times daily as needed. 03/18/16   Lloyd Huger, MD  polyethylene glycol (MIRALAX / GLYCOLAX) packet Take 17 g by mouth daily.    Historical Provider, MD  potassium chloride (K-DUR) 10 MEQ tablet Take 20 mEq by mouth daily.  10/14/15   Historical Provider, MD  prochlorperazine (COMPAZINE) 10 MG tablet Take 1 tablet (10 mg total) by mouth every 6 (six) hours as needed (Nausea or vomiting). 03/18/16   Lloyd Huger, MD  QUEtiapine (SEROQUEL) 50 MG tablet Take 100 mg by mouth at bedtime.  02/25/16   Historical Provider, MD  SPIRIVA HANDIHALER 18 MCG inhalation capsule Place 18 mcg into inhaler and inhale daily.  10/07/15   Historical Provider, MD  spironolactone (ALDACTONE) 50 MG tablet Take 50 mg by mouth daily.  10/14/15   Historical Provider, MD  sucralfate (CARAFATE) 1 g tablet Take 1 tablet (1 g total) by mouth 3 (three) times daily. Dissolve in 4 tbs warm water,  swish and swallow 04/13/16   Noreene Filbert, MD  tamsulosin (FLOMAX) 0.4 MG CAPS capsule Take 0.4 mg by mouth 2 (two) times daily.  10/14/15   Historical Provider, MD  thiamine 100 MG tablet Take 100 mg by mouth daily.    Historical Provider, MD  traZODone (DESYREL) 50 MG tablet Take 50 mg by mouth at bedtime.    Historical Provider, MD  venlafaxine XR (EFFEXOR-XR) 150 MG 24 hr capsule Take 150 mg by mouth.  03/09/16   Historical Provider, MD  vitamin B-12 (CYANOCOBALAMIN) 1000 MCG tablet Take 1,000 mcg by mouth daily.    Historical Provider, MD    Allergies Penicillins  Family History  Problem Relation Age of Onset  . Breast cancer Mother     Social History  Social History  Substance Use Topics  . Smoking status: Current Every Day Smoker    Packs/day: 0.50    Types: Cigarettes  . Smokeless tobacco: Never Used  . Alcohol use Yes    Review of Systems Constitutional: No fever/chills Eyes: No visual changes. ENT: Positive sore throat. Cardiovascular: Denies chest pain. Respiratory: Denies shortness of breath. Gastrointestinal: No abdominal pain.  No nausea, no vomiting.  No diarrhea.  No constipation. Genitourinary: Negative for dysuria. Musculoskeletal: Negative for back pain. Skin: Negative for rash. Neurological: Negative for headaches, focal weakness or numbness.  10-point ROS otherwise negative.  ____________________________________________   PHYSICAL EXAM:  VITAL SIGNS: ED Triage Vitals  Enc Vitals Group     BP 05/02/16 0037 (!) 121/59     Pulse Rate 05/02/16 0037 64     Resp 05/02/16 0037 16     Temp 05/02/16 0037 98.7 F (37.1 C)     Temp Source 05/02/16 0037 Oral     SpO2 05/02/16 0037 99 %     Weight 05/02/16 0038 125 lb (56.7 kg)     Height 05/02/16 0038 '5\' 7"'$  (1.702 m)     Head Circumference --      Peak Flow --      Pain Score --      Pain Loc --      Pain Edu? --      Excl. in [place]? --     Constitutional: Alert and oriented x 4Somewhat cachectic  nontoxic no diaphoresis speaks in full, clear sentences Eyes: PERRL EOMI. Head: Atraumatic. Nose: No congestion/rhinnorhea. Mouth/Throat: No trismus uvula midline no pharyngeal erythema or exudate no lymphadenopathy or masses appreciated Neck: No stridor.   ardiovascular: Normal rate, regular rhythm. Grossly normal heart sounds.  Good peripheral circulation. Respiratory: Normal respiratory effort.  No retractions. Lungs CTAB and moving good air Gastrointestinal: Soft nondistended nontender no rebound no guarding no peritonitis no McBurney's tenderness negative Rovsing's no costovertebral tenderness negative Murphy's Musculoskeletal: No lower extremity edema   Neurologic:  Normal speech and language. No gross focal neurologic deficits are appreciated. Skin:  Skin is warm, dry and intact. No rash noted. Psychiatric: Mood and affect are normal. Speech and behavior are normal.    ____________________________________________   DIFFERENTIAL  Viral pharyngitis, streptococcal pharyngitis, radiation damage ____________________________________________   LABS (all labs ordered are listed, but only abnormal results are displayed)  Labs Reviewed - No data to display   __________________________________________  EKG   ____________________________________________  RADIOLOGY   ____________________________________________   PROCEDURES  Procedure(s) performed: no  Procedures  Critical Care performed: no  ____________________________________________   INITIAL IMPRESSION / ASSESSMENT AND PLAN / ED COURSE  Pertinent labs & imaging results that were available during my care of the patient were reviewed by me and considered in my medical decision making (see chart for details).  Prior to giving any pain medication I checked the patient's [place] controlled substances reporting system and he has no prescriptions for opioid pain medications in the last year. I will then give  him 5 mg of oral morphine and discharged with a short course. He is tolerating his secretions, is not short of breath, and has no signs of infection.    ----------------------------------------- 2:11 AM on 05/02/2016 -----------------------------------------  The patient's pain is improved. He is reading comfortably in bed. I will prescribe a short course of oral opioid pain medication. He is discharged home in good and improved condition.   ____________________________________________   FINAL CLINICAL IMPRESSION(S) / ED  DIAGNOSES  Final diagnoses:  Throat pain in adult      NEW MEDICATIONS STARTED DURING THIS VISIT:  New Prescriptions   MORPHINE 20 MG/5ML SOLUTION    Take 1.3 mLs (5.2 mg total) by mouth every 4 (four) hours as needed for pain.     Note:  This document was prepared using Dragon voice recognition software and may include unintentional dictation errors.     Darel Hong, MD 05/02/16 0430

## 2016-05-03 NOTE — Progress Notes (Signed)
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 He continues to have problems swallowing. Several XRT treatments were held last week due to his worsening sore throat. He states that it hurts to swallow anything. He had a barium swallow on March 13th that indicated difficulty initiating swallowing but overall safe to continue his normal diet. He is currently not aspirating his foods. He continues to smoke heavily. He has no neurologic complaints. He denies numbness and tingling in hands and feet. He denies any recent fevers or illnesses. He continues to complain of dysphagia and states that his magic mouthwash and Carafate is not helping. He has a fair appetite. He complains of insomnia and states that he has not slept in days. He has no chest pain or shortness of breath. He denies any nausea, vomiting, constipation, or diarrhea. He has no urinary complaints. Patient offers no further specific complaints.  REVIEW OF SYSTEMS:   Review of Systems  Constitutional: Negative.  Negative for fever, malaise/fatigue and weight loss.  HENT: Positive for sore throat.   Respiratory: Negative.  Negative for cough and shortness of breath.   Cardiovascular: Negative.  Negative for chest pain and leg swelling.  Gastrointestinal: Negative.  Negative for abdominal pain.  Genitourinary: Negative.   Musculoskeletal: Negative.   Neurological: Negative.  Negative for sensory change and weakness.  Psychiatric/Behavioral: Positive for substance abuse. The patient has insomnia.     As per HPI. Otherwise, a complete  review of systems is negative.  PAST MEDICAL HISTORY: Past Medical History:  Diagnosis Date  . Alcohol abuse   . Cirrhosis ([place])   . COPD (chronic obstructive pulmonary disease) ([place])     PAST SURGICAL HISTORY: Past Surgical History:  Procedure Laterality Date  . TONSILLECTOMY     age was 72  . VASECTOMY      FAMILY HISTORY: Family History  Problem Relation Age of Onset  . Breast cancer Mother     ADVANCED DIRECTIVES (Y/N):  N  HEALTH MAINTENANCE: Social History  Substance Use Topics  . Smoking status: Current Every Day Smoker    Packs/day: 0.50    Types: Cigarettes  . Smokeless tobacco: Never Used  . Alcohol use Yes     Colonoscopy:  PAP:  Bone density:  Lipid panel:  Allergies  Allergen Reactions  . Penicillins Other (See Comments)    unknown    Current Outpatient Prescriptions  Medication Sig Dispense Refill  . ADVAIR DISKUS 250-50 MCG/DOSE AEPB Inhale 2 puffs into the lungs daily.    Marland Kitchen albuterol (PROVENTIL HFA;VENTOLIN HFA) 108 (90 Base) MCG/ACT inhaler Inhale 2 puffs into the lungs every 6 (six) hours as needed for wheezing or shortness of breath.    Marland Kitchen alendronate (FOSAMAX) 70 MG tablet Take 70 mg by mouth once a week.     . cholecalciferol (VITAMIN D) 1000 units tablet Take 5,000 Units by mouth daily.    Marland Kitchen donepezil (ARICEPT) 5 MG tablet     . folic acid (FOLVITE) 1 MG tablet Take 1 mg by mouth daily.    . furosemide (LASIX) 20 MG tablet Take 20 mg by mouth daily.     Marland Kitchen  lactulose (CHRONULAC) 10 GM/15ML solution Take 20 g by mouth 3 (three) times daily.    . Ledipasvir-Sofosbuvir (HARVONI) 90-400 MG TABS Take 1 tablet by mouth daily. 12 weeks,    . LORazepam (ATIVAN) 0.5 MG tablet Take 1 mg by mouth at bedtime.     . magic mouthwash w/lidocaine SOLN Take 5 mLs by mouth 3 (three) times daily as needed for mouth pain. 300 mL 0  . magnesium oxide (MAG-OX) 400 MG tablet Take 800 mg by mouth 3 (three) times daily.     . Melatonin 3 MG TABS Take 6 mg by  mouth at bedtime.    . meloxicam (MOBIC) 7.5 MG tablet Take 7.5 mg by mouth daily.     Marland Kitchen morphine 20 MG/5ML solution Take 1.3 mLs (5.2 mg total) by mouth every 4 (four) hours as needed for pain. 50 mL 0  . Multiple Vitamins-Minerals (MULTIVITAMIN WITH MINERALS) tablet Take 1 tablet by mouth daily.    . nadolol (CORGARD) 20 MG tablet Take 10 mg by mouth daily.     . ondansetron (ZOFRAN) 8 MG tablet Take 1 tablet (8 mg total) by mouth 2 (two) times daily as needed. 30 tablet 1  . polyethylene glycol (MIRALAX / GLYCOLAX) packet Take 17 g by mouth daily.    . potassium chloride (K-DUR) 10 MEQ tablet Take 20 mEq by mouth daily.     . prochlorperazine (COMPAZINE) 10 MG tablet Take 1 tablet (10 mg total) by mouth every 6 (six) hours as needed (Nausea or vomiting). 30 tablet 1  . QUEtiapine (SEROQUEL) 50 MG tablet Take 100 mg by mouth at bedtime.     Marland Kitchen SPIRIVA HANDIHALER 18 MCG inhalation capsule Place 18 mcg into inhaler and inhale daily.     Marland Kitchen spironolactone (ALDACTONE) 50 MG tablet Take 50 mg by mouth daily.     . sucralfate (CARAFATE) 1 g tablet Take 1 tablet (1 g total) by mouth 3 (three) times daily. Dissolve in 4 tbs warm water, swish and swallow 90 tablet 2  . tamsulosin (FLOMAX) 0.4 MG CAPS capsule Take 0.4 mg by mouth 2 (two) times daily.     Marland Kitchen thiamine 100 MG tablet Take 100 mg by mouth daily.    . traZODone (DESYREL) 50 MG tablet Take 50 mg by mouth at bedtime.    Marland Kitchen venlafaxine XR (EFFEXOR-XR) 150 MG 24 hr capsule Take 150 mg by mouth.     . vitamin B-12 (CYANOCOBALAMIN) 1000 MCG tablet Take 1,000 mcg by mouth daily.     No current facility-administered medications for this visit.    Facility-Administered Medications Ordered in Other Visits  Medication Dose Route Frequency Provider Last Rate Last Dose  . CISplatin (PLATINOL) 67 mg in sodium chloride 0.9 % 250 mL chemo infusion  40 mg/m2 (Treatment Plan Recorded) Intravenous Once Lloyd Huger, MD      . fosaprepitant (EMEND) 150 mg,  dexamethasone (DECADRON) 12 mg in sodium chloride 0.9 % 145 mL IVPB   Intravenous Once Lloyd Huger, MD 454 mL/hr at 05/04/16 1258      OBJECTIVE: Vitals:   05/04/16 0920  BP: 120/71  Pulse: 72  Resp: 18  Temp: (!) 96.7 F (35.9 C)     Body mass index is 18.39 kg/m.    ECOG FS:0 - Asymptomatic  General: Skinny, nourished in no acute distress. Eyes: Pink conjunctiva, anicteric sclera. HEENT: Normocephalic, moist mucous membranes. Small ulcer noted in far right oropharynx. Easily palpable lymphadenopathy in right cervical chain,  improved. Lungs: Clear to auscultation bilaterally. Heart: Regular rate and rhythm. No rubs, murmurs, or gallops. Abdomen: Soft, nontender, nondistended. No organomegaly noted, normoactive bowel sounds. Musculoskeletal: No edema, cyanosis, or clubbing. Neuro: Alert, answering all questions appropriately. Cranial nerves grossly intact. Skin: No rashes or petechiae noted. Psych: Normal affect.   LAB RESULTS:  Lab Results  Component Value Date   NA 135 05/04/2016   K 4.0 05/04/2016   CL 101 05/04/2016   CO2 29 05/04/2016   GLUCOSE 153 (H) 05/04/2016   BUN 29 (H) 05/04/2016   CREATININE 1.09 05/04/2016   CALCIUM 9.3 05/04/2016   PROT 6.7 11/18/2015   ALBUMIN 3.0 (L) 11/18/2015   AST 125 (H) 11/18/2015   ALT 53 11/18/2015   ALKPHOS 96 11/18/2015   BILITOT 0.5 11/18/2015   GFRNONAA >60 05/04/2016   GFRAA >60 05/04/2016    Lab Results  Component Value Date   WBC 3.2 (L) 05/04/2016   NEUTROABS 1.5 05/04/2016   HGB 11.6 (L) 05/04/2016   HCT 32.4 (L) 05/04/2016   MCV 97.2 05/04/2016   PLT 156 05/04/2016     STUDIES: Dg Op Swallowing Func-medicare/speech Path  Result Date: 04/29/2016 CLINICAL DATA:  Trouble swallowing for the past 6 months. History of laryngeal cancer. EXAM: MODIFIED BARIUM SWALLOW TECHNIQUE: Different consistencies of barium were administered orally to the patient by the Speech Pathologist. Imaging of the pharynx was  performed in the lateral projection. FLUOROSCOPY TIME:  Fluoroscopy Time:  1 minutes 18 seconds Radiation Exposure Index (if provided by the fluoroscopic device): 0.5 mGy Number of Acquired Spot Images: 0 COMPARISON:  None. FINDINGS: Thin liquid- Mild vallecular residuals. Laryngeal flash penetration without aspiration. Otherwise within normal limits Nectar thick liquid- Mild vallecular residuals. Otherwise within normal limits Pure- Difficulty initiating swallow. Vallecular residuals. Otherwise within normal limits Pure with cracker- Difficulty initiating swallow. Vallecular residuals. Otherwise within normal limits IMPRESSION: Modified barium swallow as described above. Please refer to the Speech Pathologists report for complete details and recommendations. 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  PLAN:    1. 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 Given patient's history of noncompliance, will not pursue induction chemotherapy and proceed with concurrent radiation therapy and weekly chemotherapy using cisplatin. Continue daily XRT. Will complete on April 3rd. Proceed with cycle 6 of weekly cisplatin. Return to clinic in 1 week for consideration of cycle 7.  2. Social situation: Because patient is a ward of the state, he will require his power of attorney to be present for any further consents. 3. Smoking cessation: Although he acknowledges the risks, patient has declined to discontinue tobacco at this time. 4. Dysphagia/mouth sores: Continue Magic mouthwash and Carafate as indicated. Had Barium Swallow on March 13th. Follow speech pathology recommendations. 2 mg Morphine IV ordered during infusion today. 5. Insomnia: Although the patient reports he does not sleep, his caretaker states he sleeps  adequately.  Patient expressed understanding and was in agreement with this plan. He also understands that He can call clinic at any time with any questions, concerns, or complaints.   Jacquelin Hawking, NP  Cancer Staging Primary cancer of larynx [facility]) Staging form: Larynx - Supraglottis, AJCC 8th Edition - Clinical stage from 02/27/2016: Stage IVA (cT1, cN2b, cM0) - Signed by Lloyd Huger, MD on 02/27/2016  Patient was seen and evaluated independently and I agree with the assessment and plan as dictated above.  Lloyd Huger, MD 05/08/16 4:23 PM

## 2016-05-04 ENCOUNTER — Inpatient Hospital Stay: Payer: Medicare Other

## 2016-05-04 ENCOUNTER — Ambulatory Visit
Admission: RE | Admit: 2016-05-04 | Discharge: 2016-05-04 | Disposition: A | Discharge status: Home / Self Care | Payer: Medicare Other | Source: Ambulatory Visit | Attending: Radiation Oncology | Admitting: Radiation Oncology

## 2016-05-04 ENCOUNTER — Inpatient Hospital Stay (HOSPITAL_BASED_OUTPATIENT_CLINIC_OR_DEPARTMENT_OTHER): Payer: Medicare Other | Admitting: Oncology

## 2016-05-04 VITALS — BP 120/71 | HR 72 | Temp 96.7°F | Resp 18 | Wt 117.4 lb

## 2016-05-04 DIAGNOSIS — Z5111 Encounter for antineoplastic chemotherapy: Secondary | ICD-10-CM | POA: Diagnosis present

## 2016-05-04 DIAGNOSIS — C329 Malignant neoplasm of larynx, unspecified: Secondary | ICD-10-CM

## 2016-05-04 DIAGNOSIS — R590 Localized enlarged lymph nodes: Secondary | ICD-10-CM

## 2016-05-04 DIAGNOSIS — J449 Chronic obstructive pulmonary disease, unspecified: Secondary | ICD-10-CM | POA: Diagnosis not present

## 2016-05-04 DIAGNOSIS — F17210 Nicotine dependence, cigarettes, uncomplicated: Secondary | ICD-10-CM

## 2016-05-04 DIAGNOSIS — Z803 Family history of malignant neoplasm of breast: Secondary | ICD-10-CM | POA: Diagnosis not present

## 2016-05-04 DIAGNOSIS — C321 Malignant neoplasm of supraglottis: Secondary | ICD-10-CM | POA: Diagnosis not present

## 2016-05-04 DIAGNOSIS — Z880 Allergy status to penicillin: Secondary | ICD-10-CM | POA: Diagnosis not present

## 2016-05-04 DIAGNOSIS — Z9119 Patient's noncompliance with other medical treatment and regimen: Secondary | ICD-10-CM

## 2016-05-04 DIAGNOSIS — R1310 Dysphagia, unspecified: Secondary | ICD-10-CM | POA: Diagnosis not present

## 2016-05-04 DIAGNOSIS — K1379 Other lesions of oral mucosa: Secondary | ICD-10-CM

## 2016-05-04 DIAGNOSIS — Z79899 Other long term (current) drug therapy: Secondary | ICD-10-CM | POA: Diagnosis not present

## 2016-05-04 DIAGNOSIS — Z510 Encounter for antineoplastic radiation therapy: Secondary | ICD-10-CM | POA: Diagnosis not present

## 2016-05-04 DIAGNOSIS — G4700 Insomnia, unspecified: Secondary | ICD-10-CM | POA: Diagnosis not present

## 2016-05-04 DIAGNOSIS — K7030 Alcoholic cirrhosis of liver without ascites: Secondary | ICD-10-CM | POA: Diagnosis not present

## 2016-05-04 LAB — CBC WITH DIFFERENTIAL/PLATELET
BASOS ABS: 0 10*3/uL (ref 0–0.1)
BASOS PCT: 1 %
EOS ABS: 0 10*3/uL (ref 0–0.7)
Eosinophils Relative: 0 %
HEMATOCRIT: 32.4 % — AB (ref 40.0–52.0)
Hemoglobin: 11.6 g/dL — ABNORMAL LOW (ref 13.0–18.0)
Lymphocytes Relative: 30 %
Lymphs Abs: 1 10*3/uL (ref 1.0–3.6)
MCH: 34.6 pg — ABNORMAL HIGH (ref 26.0–34.0)
MCHC: 35.6 g/dL (ref 32.0–36.0)
MCV: 97.2 fL (ref 80.0–100.0)
MONO ABS: 0.7 10*3/uL (ref 0.2–1.0)
Monocytes Relative: 22 %
NEUTROS ABS: 1.5 10*3/uL (ref 1.4–6.5)
NEUTROS PCT: 47 %
PLATELETS: 156 10*3/uL (ref 150–440)
RBC: 3.34 MIL/uL — ABNORMAL LOW (ref 4.40–5.90)
RDW: 14.8 % — ABNORMAL HIGH (ref 11.5–14.5)
WBC: 3.2 10*3/uL — ABNORMAL LOW (ref 3.8–10.6)

## 2016-05-04 LAB — BASIC METABOLIC PANEL
ANION GAP: 5 (ref 5–15)
BUN: 29 mg/dL — ABNORMAL HIGH (ref 6–20)
CALCIUM: 9.3 mg/dL (ref 8.9–10.3)
CO2: 29 mmol/L (ref 22–32)
Chloride: 101 mmol/L (ref 101–111)
Creatinine, Ser: 1.09 mg/dL (ref 0.61–1.24)
Glucose, Bld: 153 mg/dL — ABNORMAL HIGH (ref 65–99)
Potassium: 4 mmol/L (ref 3.5–5.1)
SODIUM: 135 mmol/L (ref 135–145)

## 2016-05-04 MED ORDER — CISPLATIN CHEMO INJECTION 100MG/100ML
40.0000 mg/m2 | Freq: Once | INTRAVENOUS | Status: AC
  Administered 2016-05-04: 67 mg via INTRAVENOUS
  Filled 2016-05-04: qty 67

## 2016-05-04 MED ORDER — PALONOSETRON HCL INJECTION 0.25 MG/5ML
0.2500 mg | Freq: Once | INTRAVENOUS | Status: AC
  Administered 2016-05-04: 0.25 mg via INTRAVENOUS
  Filled 2016-05-04: qty 5

## 2016-05-04 MED ORDER — POTASSIUM CHLORIDE 2 MEQ/ML IV SOLN
Freq: Once | INTRAVENOUS | Status: AC
  Administered 2016-05-04: 11:00:00 via INTRAVENOUS
  Filled 2016-05-04: qty 1000

## 2016-05-04 MED ORDER — MORPHINE SULFATE 2 MG/ML IJ SOLN
2.0000 mg | Freq: Once | INTRAMUSCULAR | Status: AC
  Administered 2016-05-04: 2 mg via INTRAVENOUS
  Filled 2016-05-04: qty 1

## 2016-05-04 MED ORDER — SODIUM CHLORIDE 0.9 % IV SOLN
Freq: Once | INTRAVENOUS | Status: AC
  Administered 2016-05-04: 11:00:00 via INTRAVENOUS
  Filled 2016-05-04: qty 1000

## 2016-05-04 MED ORDER — SODIUM CHLORIDE 0.9 % IV SOLN
Freq: Once | INTRAVENOUS | Status: AC
  Administered 2016-05-04: 13:00:00 via INTRAVENOUS
  Filled 2016-05-04: qty 5

## 2016-05-04 NOTE — Progress Notes (Signed)
Nutrition Follow-up:  Nutrition follow-up completed during infusion this am.  Patient receiving cisplatin.  Noted last XRT treatment due on April 3rd.   Noted visit to ED on 3/17 for sore throat.  Patient reports throat is still sore and MD is aware.    Patient reports he ate eggs and waffles for breakfast this am.  "I didn't eat the sausage because it hurts my throat." Patient reports that he is drinking the boost plus "every time they give it to me"  Asked if he is getting it 4 times per day and patient reports "I don't know." Patient can't remember what he had for dinner last night.  Patient reports that care home is preparing him soft foods.    Medications: magic mouthwash with lidocaine, carafate  Labs: glucose 153, BUN 29, creatinine WDL  Anthropometrics:   Patient's weight is 117 pounds today, decreased from 122 pounds on 3/8   NUTRITION DIAGNOSIS: Malnutrition continues   MALNUTRITION DIAGNOSIS: Severe malnutrition continues   INTERVENTION:   Continue to encouraged intake of soft, liquid high calorie, high protein foods.   Continue Boost Plus 4 times per day to provide additional 1440 calories and 56 g of protein MD aware of RD recommendation for feeding tube placement to maximize nutrition.  Not planning feeding tube at this time.    MONITORING, EVALUATION, GOAL: Patient will consume adequate calories and protein to prevent further weight loss   NEXT VISIT: as needed   Jemima Petko B. Zenia Resides, [place], [place] Registered Dietitian [phone] (pager)

## 2016-05-04 NOTE — Progress Notes (Signed)
Complains of pain with swallowing. Has poor oral intake due to difficulty swallowing.

## 2016-05-05 ENCOUNTER — Ambulatory Visit
Admission: RE | Admit: 2016-05-05 | Discharge: 2016-05-05 | Disposition: A | Discharge status: Home / Self Care | Payer: Medicare Other | Source: Ambulatory Visit | Attending: Radiation Oncology | Admitting: Radiation Oncology

## 2016-05-05 DIAGNOSIS — Z510 Encounter for antineoplastic radiation therapy: Secondary | ICD-10-CM | POA: Diagnosis not present

## 2016-05-06 ENCOUNTER — Ambulatory Visit
Admission: RE | Admit: 2016-05-06 | Discharge: 2016-05-06 | Disposition: A | Discharge status: Home / Self Care | Payer: Medicare Other | Source: Ambulatory Visit | Attending: Radiation Oncology | Admitting: Radiation Oncology

## 2016-05-06 DIAGNOSIS — Z510 Encounter for antineoplastic radiation therapy: Secondary | ICD-10-CM | POA: Diagnosis not present

## 2016-05-07 ENCOUNTER — Ambulatory Visit
Admission: RE | Admit: 2016-05-07 | Discharge: 2016-05-07 | Disposition: A | Discharge status: Home / Self Care | Payer: Medicare Other | Source: Ambulatory Visit | Attending: Radiation Oncology | Admitting: Radiation Oncology

## 2016-05-07 DIAGNOSIS — Z510 Encounter for antineoplastic radiation therapy: Secondary | ICD-10-CM | POA: Diagnosis not present

## 2016-05-08 ENCOUNTER — Ambulatory Visit
Admission: RE | Admit: 2016-05-08 | Discharge: 2016-05-08 | Disposition: A | Discharge status: Home / Self Care | Payer: Medicare Other | Source: Ambulatory Visit | Attending: Radiation Oncology | Admitting: Radiation Oncology

## 2016-05-08 DIAGNOSIS — Z510 Encounter for antineoplastic radiation therapy: Secondary | ICD-10-CM | POA: Diagnosis not present

## 2016-05-10 NOTE — Progress Notes (Deleted)
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 He continues to have problems swallowing. Several XRT treatments were held last week due to his worsening sore throat. He states that it hurts to swallow anything. He had a barium swallow on March 13th that indicated difficulty initiating swallowing but overall safe to continue his normal diet. He is currently not aspirating his foods. He continues to smoke heavily. He has no neurologic complaints. He denies numbness and tingling in hands and feet. He denies any recent fevers or illnesses. He continues to complain of dysphagia and states that his magic mouthwash and Carafate is not helping. He has a fair appetite. He complains of insomnia and states that he has not slept in days. He has no chest pain or shortness of breath. He denies any nausea, vomiting, constipation, or diarrhea. He has no urinary complaints. Patient offers no further specific complaints.  REVIEW OF SYSTEMS:   Review of Systems  Constitutional: Negative.  Negative for fever, malaise/fatigue and weight loss.  HENT: Positive for sore throat.   Respiratory: Negative.  Negative for cough and shortness of breath.   Cardiovascular: Negative.  Negative for chest pain and leg swelling.  Gastrointestinal: Negative.  Negative for abdominal pain.  Genitourinary: Negative.   Musculoskeletal: Negative.   Neurological: Negative.  Negative for sensory change and weakness.  Psychiatric/Behavioral: Positive for substance abuse. The patient has insomnia.     As per HPI. Otherwise, a complete  review of systems is negative.  PAST MEDICAL HISTORY: Past Medical History:  Diagnosis Date  . Alcohol abuse   . Cirrhosis ([place])   . COPD (chronic obstructive pulmonary disease) ([place])     PAST SURGICAL HISTORY: Past Surgical History:  Procedure Laterality Date  . TONSILLECTOMY     age was 75  . VASECTOMY      FAMILY HISTORY: Family History  Problem Relation Age of Onset  . Breast cancer Mother     ADVANCED DIRECTIVES (Y/N):  N  HEALTH MAINTENANCE: Social History  Substance Use Topics  . Smoking status: Current Every Day Smoker    Packs/day: 0.50    Types: Cigarettes  . Smokeless tobacco: Never Used  . Alcohol use Yes     Colonoscopy:  PAP:  Bone density:  Lipid panel:  Allergies  Allergen Reactions  . Penicillins Other (See Comments)    unknown    Current Outpatient Prescriptions  Medication Sig Dispense Refill  . ADVAIR DISKUS 250-50 MCG/DOSE AEPB Inhale 2 puffs into the lungs daily.    Marland Kitchen albuterol (PROVENTIL HFA;VENTOLIN HFA) 108 (90 Base) MCG/ACT inhaler Inhale 2 puffs into the lungs every 6 (six) hours as needed for wheezing or shortness of breath.    Marland Kitchen alendronate (FOSAMAX) 70 MG tablet Take 70 mg by mouth once a week.     . cholecalciferol (VITAMIN D) 1000 units tablet Take 5,000 Units by mouth daily.    Marland Kitchen donepezil (ARICEPT) 5 MG tablet     . folic acid (FOLVITE) 1 MG tablet Take 1 mg by mouth daily.    . furosemide (LASIX) 20 MG tablet Take 20 mg by mouth daily.     Marland Kitchen  lactulose (CHRONULAC) 10 GM/15ML solution Take 20 g by mouth 3 (three) times daily.    . Ledipasvir-Sofosbuvir (HARVONI) 90-400 MG TABS Take 1 tablet by mouth daily. 12 weeks,    . LORazepam (ATIVAN) 0.5 MG tablet Take 1 mg by mouth at bedtime.     . magic mouthwash w/lidocaine SOLN Take 5 mLs by mouth 3 (three) times daily as needed for mouth pain. 300 mL 0  . magnesium oxide (MAG-OX) 400 MG tablet Take 800 mg by mouth 3 (three) times daily.     . Melatonin 3 MG TABS Take 6 mg by  mouth at bedtime.    . meloxicam (MOBIC) 7.5 MG tablet Take 7.5 mg by mouth daily.     . Multiple Vitamins-Minerals (MULTIVITAMIN WITH MINERALS) tablet Take 1 tablet by mouth daily.    . nadolol (CORGARD) 20 MG tablet Take 10 mg by mouth daily.     . ondansetron (ZOFRAN) 8 MG tablet Take 1 tablet (8 mg total) by mouth 2 (two) times daily as needed. 30 tablet 1  . polyethylene glycol (MIRALAX / GLYCOLAX) packet Take 17 g by mouth daily.    . potassium chloride (K-DUR) 10 MEQ tablet Take 20 mEq by mouth daily.     . prochlorperazine (COMPAZINE) 10 MG tablet Take 1 tablet (10 mg total) by mouth every 6 (six) hours as needed (Nausea or vomiting). 30 tablet 1  . QUEtiapine (SEROQUEL) 50 MG tablet Take 100 mg by mouth at bedtime.     Marland Kitchen SPIRIVA HANDIHALER 18 MCG inhalation capsule Place 18 mcg into inhaler and inhale daily.     Marland Kitchen spironolactone (ALDACTONE) 50 MG tablet Take 50 mg by mouth daily.     . sucralfate (CARAFATE) 1 g tablet Take 1 tablet (1 g total) by mouth 3 (three) times daily. Dissolve in 4 tbs warm water, swish and swallow 90 tablet 2  . tamsulosin (FLOMAX) 0.4 MG CAPS capsule Take 0.4 mg by mouth 2 (two) times daily.     Marland Kitchen thiamine 100 MG tablet Take 100 mg by mouth daily.    . traZODone (DESYREL) 50 MG tablet Take 50 mg by mouth at bedtime.    Marland Kitchen venlafaxine XR (EFFEXOR-XR) 150 MG 24 hr capsule Take 150 mg by mouth.     . vitamin B-12 (CYANOCOBALAMIN) 1000 MCG tablet Take 1,000 mcg by mouth daily.     No current facility-administered medications for this visit.     OBJECTIVE: There were no vitals filed for this visit.   There is no height or weight on file to calculate BMI.    ECOG FS:0 - Asymptomatic  General: Skinny, nourished in no acute distress. Eyes: Pink conjunctiva, anicteric sclera. HEENT: Normocephalic, moist mucous membranes. Small ulcer noted in far right oropharynx. Easily palpable lymphadenopathy in right cervical chain, improved. Lungs: Clear to auscultation  bilaterally. Heart: Regular rate and rhythm. No rubs, murmurs, or gallops. Abdomen: Soft, nontender, nondistended. No organomegaly noted, normoactive bowel sounds. Musculoskeletal: No edema, cyanosis, or clubbing. Neuro: Alert, answering all questions appropriately. Cranial nerves grossly intact. Skin: No rashes or petechiae noted. Psych: Normal affect.   LAB RESULTS:  Lab Results  Component Value Date   NA 135 05/04/2016   K 4.0 05/04/2016   CL 101 05/04/2016   CO2 29 05/04/2016   GLUCOSE 153 (H) 05/04/2016   BUN 29 (H) 05/04/2016   CREATININE 1.09 05/04/2016   CALCIUM 9.3 05/04/2016   PROT 6.7 11/18/2015   ALBUMIN 3.0 (L) 11/18/2015  AST 125 (H) 11/18/2015   ALT 53 11/18/2015   ALKPHOS 96 11/18/2015   BILITOT 0.5 11/18/2015   GFRNONAA >60 05/04/2016   GFRAA >60 05/04/2016    Lab Results  Component Value Date   WBC 3.2 (L) 05/04/2016   NEUTROABS 1.5 05/04/2016   HGB 11.6 (L) 05/04/2016   HCT 32.4 (L) 05/04/2016   MCV 97.2 05/04/2016   PLT 156 05/04/2016     STUDIES: Dg Op Swallowing Func-medicare/speech Path  Result Date: 04/29/2016 CLINICAL DATA:  Trouble swallowing for the past 6 months. History of laryngeal cancer. EXAM: MODIFIED BARIUM SWALLOW TECHNIQUE: Different consistencies of barium were administered orally to the patient by the Speech Pathologist. Imaging of the pharynx was performed in the lateral projection. FLUOROSCOPY TIME:  Fluoroscopy Time:  1 minutes 18 seconds Radiation Exposure Index (if provided by the fluoroscopic device): 0.5 mGy Number of Acquired Spot Images: 0 COMPARISON:  None. FINDINGS: Thin liquid- Mild vallecular residuals. Laryngeal flash penetration without aspiration. Otherwise within normal limits Nectar thick liquid- Mild vallecular residuals. Otherwise within normal limits Pure- Difficulty initiating swallow. Vallecular residuals. Otherwise within normal limits Pure with cracker- Difficulty initiating swallow. Vallecular residuals.  Otherwise within normal limits IMPRESSION: Modified barium swallow as described above. Please refer to the Speech Pathologists report for complete details and recommendations. 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  PLAN:    1. 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 Given patient's history of noncompliance, will not pursue induction chemotherapy and proceed with concurrent radiation therapy and weekly chemotherapy using cisplatin. Continue daily XRT. Will complete on April 3rd. Proceed with cycle 6 of weekly cisplatin. Return to clinic in 1 week for consideration of cycle 7.  2. Social situation: Because patient is a ward of the state, he will require his power of attorney to be present for any further consents. 3. Smoking cessation: Although he acknowledges the risks, patient has declined to discontinue tobacco at this time. 4. Dysphagia/mouth sores: Continue Magic mouthwash and Carafate as indicated. Had Barium Swallow on March 13th. Follow speech pathology recommendations. 2 mg Morphine IV ordered during infusion today. 5. Insomnia: Although the patient reports he does not sleep, his caretaker states he sleeps adequately.  Patient expressed understanding and was in agreement with this plan. He also understands that He can call clinic at any time with any questions, concerns, or complaints.    Cancer Staging Primary cancer of larynx [facility]) Staging form: Larynx - Supraglottis, AJCC 8th Edition - Clinical stage from 02/27/2016: Stage IVA (cT1, cN2b, cM0) - Signed by Lloyd Huger, MD on 02/27/2016   Lloyd Huger, MD 05/10/16 2:59 PM

## 2016-05-11 ENCOUNTER — Inpatient Hospital Stay: Payer: Medicare Other

## 2016-05-11 ENCOUNTER — Ambulatory Visit
Admission: RE | Admit: 2016-05-11 | Discharge: 2016-05-11 | Disposition: A | Discharge status: Home / Self Care | Payer: Medicare Other | Source: Ambulatory Visit | Attending: Radiation Oncology | Admitting: Radiation Oncology

## 2016-05-11 ENCOUNTER — Inpatient Hospital Stay: Payer: Medicare Other | Admitting: Oncology

## 2016-05-12 ENCOUNTER — Ambulatory Visit
Admission: RE | Admit: 2016-05-12 | Discharge: 2016-05-12 | Disposition: A | Discharge status: Home / Self Care | Payer: Medicare Other | Source: Ambulatory Visit | Attending: Radiation Oncology | Admitting: Radiation Oncology

## 2016-05-12 NOTE — Progress Notes (Signed)
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 He continues to have dysphasia, but this is helped with Magic mouthwash and morphine as needed. He continues to smoke heavily. He has no neurologic complaints. He denies numbness and tingling in hands and feet. He denies any recent fevers or illnesses. He has a fair appetite. He has no chest pain or shortness of breath. He denies any nausea, vomiting, constipation, or diarrhea. He has no urinary complaints. Patient offers no further specific complaints.  REVIEW OF SYSTEMS:   Review of Systems  Constitutional: Negative.  Negative for fever, malaise/fatigue and weight loss.  HENT: Positive for sore throat.   Respiratory: Negative.  Negative for cough and shortness of breath.   Cardiovascular: Negative.  Negative for chest pain and leg swelling.  Gastrointestinal: Negative.  Negative for abdominal pain.  Genitourinary: Negative.   Musculoskeletal: Negative.   Neurological: Negative.  Negative for sensory change and weakness.  Psychiatric/Behavioral: Positive for substance abuse. The patient has insomnia.     As per HPI. Otherwise, a complete review of systems is negative.  PAST MEDICAL HISTORY: Past Medical History:  Diagnosis Date  . Alcohol abuse   . Cirrhosis ([place])   . COPD (chronic obstructive pulmonary disease) ([place])     PAST SURGICAL HISTORY: Past Surgical History:  Procedure Laterality Date  . TONSILLECTOMY     age was 89  . VASECTOMY      FAMILY HISTORY: Family History  Problem Relation Age of Onset  . Breast cancer  Mother     ADVANCED DIRECTIVES (Y/N):  N  HEALTH MAINTENANCE: Social History  Substance Use Topics  . Smoking status: Current Every Day Smoker    Packs/day: 0.50    Types: Cigarettes  . Smokeless tobacco: Never Used  . Alcohol use Yes     Colonoscopy:  PAP:  Bone density:  Lipid panel:  Allergies  Allergen Reactions  . Penicillins Other (See Comments)    unknown    Current Outpatient Prescriptions  Medication Sig Dispense Refill  . ADVAIR DISKUS 250-50 MCG/DOSE AEPB Inhale 2 puffs into the lungs daily.    Marland Kitchen albuterol (PROVENTIL HFA;VENTOLIN HFA) 108 (90 Base) MCG/ACT inhaler Inhale 2 puffs into the lungs every 6 (six) hours as needed for wheezing or shortness of breath.    Marland Kitchen alendronate (FOSAMAX) 70 MG tablet Take 70 mg by mouth once a week.     . cholecalciferol (VITAMIN D) 1000 units tablet Take 5,000 Units by mouth daily.    Marland Kitchen donepezil (ARICEPT) 5 MG tablet Take 5 mg by mouth at bedtime.     . folic acid (FOLVITE) 1 MG tablet Take 1 mg by mouth daily.    . furosemide (LASIX) 20 MG tablet Take 20 mg by mouth daily.     Marland Kitchen lactulose (CHRONULAC) 10 GM/15ML solution Take 20 g by mouth 3 (three) times daily.    Marland Kitchen LORazepam (ATIVAN) 0.5 MG tablet Take 1 mg by mouth at bedtime.     . magic mouthwash w/lidocaine SOLN Take 5 mLs by mouth 3 (three) times daily as needed for mouth pain. 300 mL 0  . magnesium oxide (  MAG-OX) 400 MG tablet Take 800 mg by mouth 3 (three) times daily.     . Melatonin 3 MG TABS Take 6 mg by mouth at bedtime.    . meloxicam (MOBIC) 7.5 MG tablet Take 7.5 mg by mouth daily.     . Multiple Vitamins-Minerals (MULTIVITAMIN WITH MINERALS) tablet Take 1 tablet by mouth daily.    . nadolol (CORGARD) 20 MG tablet Take 10 mg by mouth daily.     . ondansetron (ZOFRAN) 8 MG tablet Take 1 tablet (8 mg total) by mouth 2 (two) times daily as needed. 30 tablet 1  . polyethylene glycol (MIRALAX / GLYCOLAX) packet Take 17 g by mouth daily.    . potassium chloride  (K-DUR) 10 MEQ tablet Take 20 mEq by mouth daily.     . prochlorperazine (COMPAZINE) 10 MG tablet Take 1 tablet (10 mg total) by mouth every 6 (six) hours as needed (Nausea or vomiting). 30 tablet 1  . QUEtiapine (SEROQUEL) 50 MG tablet Take 100 mg by mouth at bedtime.     Marland Kitchen SPIRIVA HANDIHALER 18 MCG inhalation capsule Place 18 mcg into inhaler and inhale daily.     Marland Kitchen spironolactone (ALDACTONE) 50 MG tablet Take 50 mg by mouth daily.     . sucralfate (CARAFATE) 1 g tablet Take 1 tablet (1 g total) by mouth 3 (three) times daily. Dissolve in 4 tbs warm water, swish and swallow 90 tablet 2  . tamsulosin (FLOMAX) 0.4 MG CAPS capsule Take 0.4 mg by mouth 2 (two) times daily.     Marland Kitchen thiamine 100 MG tablet Take 100 mg by mouth daily.    . traZODone (DESYREL) 50 MG tablet Take 50 mg by mouth at bedtime.    . vitamin B-12 (CYANOCOBALAMIN) 1000 MCG tablet Take 1,000 mcg by mouth daily.    . Ledipasvir-Sofosbuvir (HARVONI) 90-400 MG TABS Take 1 tablet by mouth daily. 12 weeks,    . venlafaxine XR (EFFEXOR-XR) 150 MG 24 hr capsule Take 150 mg by mouth daily with breakfast.      No current facility-administered medications for this visit.    Facility-Administered Medications Ordered in Other Visits  Medication Dose Route Frequency Provider Last Rate Last Dose  . heparin lock flush 100 unit/mL  500 Units Intravenous Once Lloyd Huger, MD        OBJECTIVE: Vitals:   05/13/16 1029  BP: 112/74  Pulse: (!) 54  Resp: 18  Temp: 97.4 F (36.3 C)     Body mass index is 18.18 kg/m.    ECOG FS:0 - Asymptomatic  General: Thin, no acute distress. Eyes: Pink conjunctiva, anicteric sclera. HEENT: Normocephalic, moist mucous membranes. Ulceration noted in far left oropharynx. No palpable lymphadenopathy in right cervical chain. Lungs: Clear to auscultation bilaterally. Heart: Regular rate and rhythm. No rubs, murmurs, or gallops. Abdomen: Soft, nontender, nondistended. No organomegaly noted, normoactive  bowel sounds. Musculoskeletal: No edema, cyanosis, or clubbing. Neuro: Alert, answering all questions appropriately. Cranial nerves grossly intact. Skin: No rashes or petechiae noted. Psych: Normal affect.   LAB RESULTS:  Lab Results  Component Value Date   NA 130 (L) 05/13/2016   K 5.5 (H) 05/13/2016   CL 98 (L) 05/13/2016   CO2 26 05/13/2016   GLUCOSE 113 (H) 05/13/2016   BUN 38 (H) 05/13/2016   CREATININE 1.66 (H) 05/13/2016   CALCIUM 9.2 05/13/2016   PROT 6.7 11/18/2015   ALBUMIN 3.0 (L) 11/18/2015   AST 125 (H) 11/18/2015   ALT 53 11/18/2015  ALKPHOS 96 11/18/2015   BILITOT 0.5 11/18/2015   GFRNONAA 39 (L) 05/13/2016   GFRAA 46 (L) 05/13/2016    Lab Results  Component Value Date   WBC 4.9 05/13/2016   NEUTROABS 3.1 05/13/2016   HGB 11.0 (L) 05/13/2016   HCT 30.6 (L) 05/13/2016   MCV 96.3 05/13/2016   PLT 204 05/13/2016     STUDIES: Dg Op Swallowing Func-medicare/speech Path  Result Date: 04/29/2016 CLINICAL DATA:  Trouble swallowing for the past 6 months. History of laryngeal cancer. EXAM: MODIFIED BARIUM SWALLOW TECHNIQUE: Different consistencies of barium were administered orally to the patient by the Speech Pathologist. Imaging of the pharynx was performed in the lateral projection. FLUOROSCOPY TIME:  Fluoroscopy Time:  1 minutes 18 seconds Radiation Exposure Index (if provided by the fluoroscopic device): 0.5 mGy Number of Acquired Spot Images: 0 COMPARISON:  None. FINDINGS: Thin liquid- Mild vallecular residuals. Laryngeal flash penetration without aspiration. Otherwise within normal limits Nectar thick liquid- Mild vallecular residuals. Otherwise within normal limits Pure- Difficulty initiating swallow. Vallecular residuals. Otherwise within normal limits Pure with cracker- Difficulty initiating swallow. Vallecular residuals. Otherwise within normal limits IMPRESSION: Modified barium swallow as described above. Please refer to the Speech Pathologists report  for complete details and recommendations. 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  PLAN:    1. 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 Given patient's history of noncompliance, will not pursue induction chemotherapy and proceed with concurrent radiation therapy and weekly chemotherapy using cisplatin. Continue daily XRT, which will be completed on May 19, 2016. Will discontinue cisplatin given patient's increasing creatinine. No further chemotherapy is planned. Return to clinic in 8 weeks with restaging PET scan and further evaluation.  2. Social situation: Because patient is a ward of the state, he will require his power of attorney to be present for any further consents. 3. Smoking cessation: Although he acknowledges the risks, patient has declined to discontinue tobacco at this time. 4. Dysphagia/mouth sores: Continue Magic mouthwash and Carafate as indicated. Had Barium Swallow on March 13th. Follow speech pathology recommendations. Continue morphine as prescribed.  5. Insomnia: Although the patient reports he does not sleep, his caretaker states he sleeps adequately. 6. Increasing creatinine: Discontinue cisplatin as above.  Patient expressed understanding and was in agreement with this plan. He also understands that He can call clinic at any time with any questions, concerns, or complaints.    Cancer Staging Primary cancer of larynx [facility]) Staging form: Larynx - Supraglottis, AJCC 8th Edition - Clinical stage from 02/27/2016: Stage IVA (cT1, cN2b, cM0) - Signed by Lloyd Huger, MD on 02/27/2016   Lloyd Huger, MD 05/13/16 11:44 AM

## 2016-05-13 ENCOUNTER — Encounter: Payer: Self-pay | Admitting: Emergency Medicine

## 2016-05-13 ENCOUNTER — Inpatient Hospital Stay
Admission: EM | Admit: 2016-05-13 | Discharge: 2016-05-15 | DRG: 682 | Disposition: A | Discharge status: Home / Self Care | Payer: Medicare Other | Attending: Internal Medicine | Admitting: Internal Medicine

## 2016-05-13 ENCOUNTER — Ambulatory Visit
Admission: RE | Admit: 2016-05-13 | Discharge: 2016-05-13 | Disposition: A | Discharge status: Home / Self Care | Payer: Medicare Other | Source: Ambulatory Visit | Attending: Radiation Oncology | Admitting: Radiation Oncology

## 2016-05-13 ENCOUNTER — Inpatient Hospital Stay (HOSPITAL_BASED_OUTPATIENT_CLINIC_OR_DEPARTMENT_OTHER): Payer: Medicare Other | Admitting: Oncology

## 2016-05-13 ENCOUNTER — Emergency Department: Payer: Medicare Other

## 2016-05-13 ENCOUNTER — Inpatient Hospital Stay: Payer: Medicare Other

## 2016-05-13 ENCOUNTER — Encounter: Payer: Self-pay | Admitting: Oncology

## 2016-05-13 ENCOUNTER — Ambulatory Visit: Payer: Medicare Other

## 2016-05-13 VITALS — BP 112/74 | HR 54 | Temp 97.4°F | Resp 18 | Ht 67.0 in | Wt 116.1 lb

## 2016-05-13 DIAGNOSIS — B1920 Unspecified viral hepatitis C without hepatic coma: Secondary | ICD-10-CM | POA: Diagnosis present

## 2016-05-13 DIAGNOSIS — J449 Chronic obstructive pulmonary disease, unspecified: Secondary | ICD-10-CM | POA: Diagnosis present

## 2016-05-13 DIAGNOSIS — Z515 Encounter for palliative care: Secondary | ICD-10-CM | POA: Diagnosis not present

## 2016-05-13 DIAGNOSIS — Z79899 Other long term (current) drug therapy: Secondary | ICD-10-CM | POA: Diagnosis not present

## 2016-05-13 DIAGNOSIS — Z681 Body mass index (BMI) 19 or less, adult: Secondary | ICD-10-CM

## 2016-05-13 DIAGNOSIS — N179 Acute kidney failure, unspecified: Principal | ICD-10-CM | POA: Diagnosis present

## 2016-05-13 DIAGNOSIS — R269 Unspecified abnormalities of gait and mobility: Secondary | ICD-10-CM | POA: Diagnosis present

## 2016-05-13 DIAGNOSIS — R1310 Dysphagia, unspecified: Secondary | ICD-10-CM

## 2016-05-13 DIAGNOSIS — E871 Hypo-osmolality and hyponatremia: Secondary | ICD-10-CM | POA: Diagnosis present

## 2016-05-13 DIAGNOSIS — W1830XA Fall on same level, unspecified, initial encounter: Secondary | ICD-10-CM | POA: Diagnosis present

## 2016-05-13 DIAGNOSIS — E875 Hyperkalemia: Secondary | ICD-10-CM | POA: Diagnosis not present

## 2016-05-13 DIAGNOSIS — Z791 Long term (current) use of non-steroidal anti-inflammatories (NSAID): Secondary | ICD-10-CM | POA: Diagnosis not present

## 2016-05-13 DIAGNOSIS — Z803 Family history of malignant neoplasm of breast: Secondary | ICD-10-CM | POA: Diagnosis not present

## 2016-05-13 DIAGNOSIS — Z9119 Patient's noncompliance with other medical treatment and regimen: Secondary | ICD-10-CM | POA: Diagnosis not present

## 2016-05-13 DIAGNOSIS — C321 Malignant neoplasm of supraglottis: Secondary | ICD-10-CM | POA: Diagnosis not present

## 2016-05-13 DIAGNOSIS — E43 Unspecified severe protein-calorie malnutrition: Secondary | ICD-10-CM | POA: Diagnosis present

## 2016-05-13 DIAGNOSIS — R590 Localized enlarged lymph nodes: Secondary | ICD-10-CM

## 2016-05-13 DIAGNOSIS — F1027 Alcohol dependence with alcohol-induced persisting dementia: Secondary | ICD-10-CM | POA: Diagnosis present

## 2016-05-13 DIAGNOSIS — Z880 Allergy status to penicillin: Secondary | ICD-10-CM | POA: Diagnosis not present

## 2016-05-13 DIAGNOSIS — K1379 Other lesions of oral mucosa: Secondary | ICD-10-CM

## 2016-05-13 DIAGNOSIS — F17210 Nicotine dependence, cigarettes, uncomplicated: Secondary | ICD-10-CM | POA: Diagnosis present

## 2016-05-13 DIAGNOSIS — R296 Repeated falls: Secondary | ICD-10-CM | POA: Diagnosis present

## 2016-05-13 DIAGNOSIS — K7030 Alcoholic cirrhosis of liver without ascites: Secondary | ICD-10-CM

## 2016-05-13 DIAGNOSIS — W19XXXA Unspecified fall, initial encounter: Secondary | ICD-10-CM

## 2016-05-13 DIAGNOSIS — Z7983 Long term (current) use of bisphosphonates: Secondary | ICD-10-CM

## 2016-05-13 DIAGNOSIS — C329 Malignant neoplasm of larynx, unspecified: Secondary | ICD-10-CM

## 2016-05-13 DIAGNOSIS — C801 Malignant (primary) neoplasm, unspecified: Secondary | ICD-10-CM

## 2016-05-13 DIAGNOSIS — Z7951 Long term (current) use of inhaled steroids: Secondary | ICD-10-CM | POA: Diagnosis not present

## 2016-05-13 DIAGNOSIS — C140 Malignant neoplasm of pharynx, unspecified: Secondary | ICD-10-CM | POA: Diagnosis not present

## 2016-05-13 DIAGNOSIS — K7460 Unspecified cirrhosis of liver: Secondary | ICD-10-CM | POA: Diagnosis present

## 2016-05-13 DIAGNOSIS — G4700 Insomnia, unspecified: Secondary | ICD-10-CM

## 2016-05-13 DIAGNOSIS — E860 Dehydration: Secondary | ICD-10-CM | POA: Diagnosis present

## 2016-05-13 DIAGNOSIS — Z7189 Other specified counseling: Secondary | ICD-10-CM | POA: Diagnosis not present

## 2016-05-13 HISTORY — DX: Unspecified viral hepatitis C without hepatic coma: B19.20

## 2016-05-13 HISTORY — DX: Unspecified protein-calorie malnutrition: E46

## 2016-05-13 HISTORY — DX: Malignant neoplasm of pharynx, unspecified: C14.0

## 2016-05-13 LAB — BASIC METABOLIC PANEL
ANION GAP: 6 (ref 5–15)
ANION GAP: 6 (ref 5–15)
Anion gap: 5 (ref 5–15)
BUN: 38 mg/dL — ABNORMAL HIGH (ref 6–20)
BUN: 36 mg/dL — ABNORMAL HIGH (ref 6–20)
BUN: 31 mg/dL — AB (ref 6–20)
CHLORIDE: 98 mmol/L — AB (ref 101–111)
CHLORIDE: 102 mmol/L (ref 101–111)
CHLORIDE: 107 mmol/L (ref 101–111)
CO2: 26 mmol/L (ref 22–32)
CO2: 25 mmol/L (ref 22–32)
CO2: 26 mmol/L (ref 22–32)
Calcium: 9.2 mg/dL (ref 8.9–10.3)
Calcium: 9.1 mg/dL (ref 8.9–10.3)
Calcium: 8.8 mg/dL — ABNORMAL LOW (ref 8.9–10.3)
Creatinine, Ser: 1.66 mg/dL — ABNORMAL HIGH (ref 0.61–1.24)
Creatinine, Ser: 1.7 mg/dL — ABNORMAL HIGH (ref 0.61–1.24)
Creatinine, Ser: 1.43 mg/dL — ABNORMAL HIGH (ref 0.61–1.24)
GFR calc Af Amer: 46 mL/min — ABNORMAL LOW (ref >60)
GFR calc Af Amer: 55 mL/min — ABNORMAL LOW (ref >60)
GFR calc non Af Amer: 39 mL/min — ABNORMAL LOW (ref >60)
GFR calc non Af Amer: 38 mL/min — ABNORMAL LOW (ref >60)
GFR calc non Af Amer: 47 mL/min — ABNORMAL LOW (ref >60)
GFR, EST AFRICAN AMERICAN: 44 mL/min — AB (ref >60)
GLUCOSE: 113 mg/dL — AB (ref 65–99)
GLUCOSE: 74 mg/dL (ref 65–99)
Glucose, Bld: 97 mg/dL (ref 65–99)
POTASSIUM: 4.5 mmol/L (ref 3.5–5.1)
Potassium: 5.5 mmol/L — ABNORMAL HIGH (ref 3.5–5.1)
Potassium: 6.3 mmol/L (ref 3.5–5.1)
SODIUM: 138 mmol/L (ref 135–145)
Sodium: 130 mmol/L — ABNORMAL LOW (ref 135–145)
Sodium: 133 mmol/L — ABNORMAL LOW (ref 135–145)

## 2016-05-13 LAB — CBC
HEMATOCRIT: 31.1 % — AB (ref 40.0–52.0)
HEMOGLOBIN: 11 g/dL — AB (ref 13.0–18.0)
MCH: 34.6 pg — AB (ref 26.0–34.0)
MCHC: 35.3 g/dL (ref 32.0–36.0)
MCV: 98.1 fL (ref 80.0–100.0)
Platelets: 185 10*3/uL (ref 150–440)
RBC: 3.17 MIL/uL — AB (ref 4.40–5.90)
RDW: 16 % — ABNORMAL HIGH (ref 11.5–14.5)
WBC: 5.2 10*3/uL (ref 3.8–10.6)

## 2016-05-13 LAB — CBC WITH DIFFERENTIAL/PLATELET
BASOS ABS: 0 10*3/uL (ref 0–0.1)
Basophils Relative: 1 %
Eosinophils Absolute: 0 10*3/uL (ref 0–0.7)
Eosinophils Relative: 0 %
HEMATOCRIT: 30.6 % — AB (ref 40.0–52.0)
HEMOGLOBIN: 11 g/dL — AB (ref 13.0–18.0)
LYMPHS PCT: 16 %
Lymphs Abs: 0.8 10*3/uL — ABNORMAL LOW (ref 1.0–3.6)
MCH: 34.5 pg — ABNORMAL HIGH (ref 26.0–34.0)
MCHC: 35.9 g/dL (ref 32.0–36.0)
MCV: 96.3 fL (ref 80.0–100.0)
MONOS PCT: 21 %
Monocytes Absolute: 1 10*3/uL (ref 0.2–1.0)
NEUTROS ABS: 3.1 10*3/uL (ref 1.4–6.5)
NEUTROS PCT: 62 %
Platelets: 204 10*3/uL (ref 150–440)
RBC: 3.18 MIL/uL — ABNORMAL LOW (ref 4.40–5.90)
RDW: 15.3 % — ABNORMAL HIGH (ref 11.5–14.5)
WBC: 4.9 10*3/uL (ref 3.8–10.6)

## 2016-05-13 MED ORDER — DEXTROSE 50 % IV SOLN
1.0000 | Freq: Once | INTRAVENOUS | Status: AC
  Administered 2016-05-13: 50 mL via INTRAVENOUS
  Filled 2016-05-13: qty 50

## 2016-05-13 MED ORDER — INSULIN ASPART 100 UNIT/ML ~~LOC~~ SOLN
5.0000 [IU] | Freq: Once | SUBCUTANEOUS | Status: AC
  Administered 2016-05-13: 5 [IU] via INTRAVENOUS
  Filled 2016-05-13: qty 5

## 2016-05-13 MED ORDER — TAMSULOSIN HCL 0.4 MG PO CAPS
ORAL_CAPSULE | ORAL | Status: AC
  Administered 2016-05-13: 0.4 mg via ORAL
  Filled 2016-05-13: qty 1

## 2016-05-13 MED ORDER — TRAZODONE HCL 50 MG PO TABS
50.0000 mg | ORAL_TABLET | Freq: Every day | ORAL | Status: DC
  Administered 2016-05-14 (×2): 50 mg via ORAL
  Filled 2016-05-13 (×2): qty 1

## 2016-05-13 MED ORDER — ALBUTEROL SULFATE (2.5 MG/3ML) 0.083% IN NEBU
5.0000 mg | INHALATION_SOLUTION | Freq: Once | RESPIRATORY_TRACT | Status: DC
  Filled 2016-05-13: qty 6

## 2016-05-13 MED ORDER — SODIUM CHLORIDE 0.9 % IV SOLN
1.0000 g | Freq: Once | INTRAVENOUS | Status: AC
  Administered 2016-05-13: 1 g via INTRAVENOUS
  Filled 2016-05-13: qty 10

## 2016-05-13 MED ORDER — QUETIAPINE FUMARATE 25 MG PO TABS
50.0000 mg | ORAL_TABLET | Freq: Every day | ORAL | Status: DC
  Administered 2016-05-14 (×2): 50 mg via ORAL
  Filled 2016-05-13 (×2): qty 2

## 2016-05-13 MED ORDER — OXYCODONE HCL 5 MG PO TABS: 5.0000 mg | ORAL_TABLET | ORAL | Status: DC | PRN

## 2016-05-13 MED ORDER — POLYETHYLENE GLYCOL 3350 17 G PO PACK: 17.0000 g | PACK | Freq: Every day | ORAL | Status: DC | PRN

## 2016-05-13 MED ORDER — ALBUTEROL SULFATE (2.5 MG/3ML) 0.083% IN NEBU
2.5000 mg | INHALATION_SOLUTION | RESPIRATORY_TRACT | Status: DC | PRN
  Administered 2016-05-13: 2.5 mg via RESPIRATORY_TRACT

## 2016-05-13 MED ORDER — HEPARIN SOD (PORK) LOCK FLUSH 100 UNIT/ML IV SOLN
500.0000 [IU] | Freq: Once | INTRAVENOUS | Status: AC
  Filled 2016-05-13: qty 5

## 2016-05-13 MED ORDER — SODIUM CHLORIDE 0.9 % IV BOLUS (SEPSIS)
1000.0000 mL | Freq: Once | INTRAVENOUS | Status: AC
  Administered 2016-05-13: 1000 mL via INTRAVENOUS

## 2016-05-13 MED ORDER — TIOTROPIUM BROMIDE MONOHYDRATE 18 MCG IN CAPS
18.0000 ug | ORAL_CAPSULE | Freq: Every day | RESPIRATORY_TRACT | Status: DC
  Administered 2016-05-14 – 2016-05-15 (×2): 18 ug via RESPIRATORY_TRACT
  Filled 2016-05-13: qty 5

## 2016-05-13 MED ORDER — TAMSULOSIN HCL 0.4 MG PO CAPS
0.4000 mg | ORAL_CAPSULE | Freq: Every day | ORAL | Status: DC
  Administered 2016-05-13 – 2016-05-15 (×3): 0.4 mg via ORAL
  Filled 2016-05-13 (×2): qty 1

## 2016-05-13 MED ORDER — SODIUM CHLORIDE 0.9 % IV SOLN
INTRAVENOUS | Status: DC
  Administered 2016-05-14 – 2016-05-15 (×4): via INTRAVENOUS

## 2016-05-13 MED ORDER — ENOXAPARIN SODIUM 40 MG/0.4ML ~~LOC~~ SOLN
30.0000 mg | SUBCUTANEOUS | Status: DC
  Administered 2016-05-14 (×2): 30 mg via SUBCUTANEOUS
  Filled 2016-05-13 (×2): qty 0.4

## 2016-05-13 MED ORDER — ONDANSETRON HCL 4 MG/2ML IJ SOLN: 4.0000 mg | Freq: Four times a day (QID) | INTRAMUSCULAR | Status: DC | PRN

## 2016-05-13 MED ORDER — ACETAMINOPHEN 325 MG PO TABS: 650.0000 mg | ORAL_TABLET | Freq: Four times a day (QID) | ORAL | Status: DC | PRN

## 2016-05-13 MED ORDER — SODIUM CHLORIDE 0.9% FLUSH
3.0000 mL | Freq: Two times a day (BID) | INTRAVENOUS | Status: DC
  Administered 2016-05-14 (×2): 3 mL via INTRAVENOUS

## 2016-05-13 MED ORDER — LACTULOSE 10 GM/15ML PO SOLN
20.0000 g | Freq: Two times a day (BID) | ORAL | Status: DC
  Administered 2016-05-14 (×3): 20 g via ORAL
  Filled 2016-05-13 (×3): qty 30

## 2016-05-13 MED ORDER — ACETAMINOPHEN 650 MG RE SUPP: 650.0000 mg | Freq: Four times a day (QID) | RECTAL | Status: DC | PRN

## 2016-05-13 MED ORDER — ONDANSETRON HCL 4 MG PO TABS: 4.0000 mg | ORAL_TABLET | Freq: Four times a day (QID) | ORAL | Status: DC | PRN

## 2016-05-13 MED ORDER — DONEPEZIL HCL 5 MG PO TABS
5.0000 mg | ORAL_TABLET | Freq: Every day | ORAL | Status: DC
  Administered 2016-05-14 (×2): 5 mg via ORAL
  Filled 2016-05-13 (×2): qty 1

## 2016-05-13 MED ORDER — MOMETASONE FURO-FORMOTEROL FUM 200-5 MCG/ACT IN AERO
2.0000 | INHALATION_SPRAY | Freq: Two times a day (BID) | RESPIRATORY_TRACT | Status: DC
  Administered 2016-05-14 – 2016-05-15 (×3): 2 via RESPIRATORY_TRACT
  Filled 2016-05-13: qty 8.8

## 2016-05-13 MED ORDER — SODIUM CHLORIDE 0.9 % IV SOLN: 1.0000 g | Freq: Once | INTRAVENOUS | Status: DC

## 2016-05-13 NOTE — Progress Notes (Signed)
Order for enoxaparin 40 mg subcutaneously daily for DVT prophylaxis was changed to 30 mg daily dose per anticoagulation protocol for CrCl < 30 mL/min.  Lenis Noon, PharmD Clinical Pharmacist 05/13/16 7:49 PM

## 2016-05-13 NOTE — ED Provider Notes (Signed)
[facility] Emergency Department Provider Note    First MD Initiated Contact with Patient 05/13/16 1843     (approximate)  I have reviewed the triage vital signs and the nursing notes.   HISTORY  Chief Complaint Weakness    HPI [name] is a 71 y.o. male with a history of alcohol abuse and cirrhosis as well as COPD with throat cancer undergoing chemotherapy and radiation therapy presents for frequent falls from group home. Patient had outpatient hemonc VISIT today. Routine checkup was reassured. Patient scheduled for restarting therapy of later this week. The patient back to group home. Had several falls today at his group home. Patient states that he slipped on slick floors.  States he did slip and hit his head on wall. No LOC. States she's had very poor appetite. States is secondary to sore throat secondary to his treatments. Denies any numbness or tingling. No chest pain. No shortness of breath.   Past Medical History:  Diagnosis Date  . Alcohol abuse   . Cirrhosis ([place])   . COPD (chronic obstructive pulmonary disease) ([place])   . Hepatitis C   . Malnutrition ([place])   . Throat cancer [facility])    Family History  Problem Relation Age of Onset  . Breast cancer Mother    Past Surgical History:  Procedure Laterality Date  . TONSILLECTOMY     age was 42  . VASECTOMY     Patient Active Problem List   Diagnosis Date Noted  . AKI (acute kidney injury) ([place]) 05/13/2016  . Goals of care, counseling/discussion 03/01/2016  . Primary cancer of larynx ([place]) 02/19/2016  . Alcohol abuse 11/18/2015  . Dementia with behavioral disturbance 10/21/2015      Prior to Admission medications   Medication Sig Start Date End Date Taking? Authorizing Provider  Cholecalciferol (VITAMIN D3) 5000 units TABS Take 5,000 Units by mouth daily.   Yes Historical Provider, MD  folic acid (FOLVITE) 1 MG tablet Take 1 mg by mouth daily.   Yes Historical Provider, MD    furosemide (LASIX) 20 MG tablet Take 20 mg by mouth daily.  10/14/15  Yes Historical Provider, MD  nadolol (CORGARD) 20 MG tablet Take 10 mg by mouth daily.  09/16/15  Yes Historical Provider, MD  Potassium Chloride ER 20 MEQ TBCR Take 20 mEq by mouth daily.  10/14/15  Yes Historical Provider, MD  venlafaxine XR (EFFEXOR-XR) 150 MG 24 hr capsule Take 150 mg by mouth daily with breakfast.  03/09/16  Yes Historical Provider, MD  ADVAIR DISKUS 250-50 MCG/DOSE AEPB Inhale 2 puffs into the lungs daily. 08/29/15   Historical Provider, MD  albuterol (PROVENTIL HFA;VENTOLIN HFA) 108 (90 Base) MCG/ACT inhaler Inhale 2 puffs into the lungs every 6 (six) hours as needed for wheezing or shortness of breath.    Historical Provider, MD  alendronate (FOSAMAX) 70 MG tablet Take 70 mg by mouth once a week.  09/24/15   Historical Provider, MD  donepezil (ARICEPT) 5 MG tablet Take 5 mg by mouth at bedtime.  03/02/16   Historical Provider, MD  lactulose (CHRONULAC) 10 GM/15ML solution Take 20 g by mouth 3 (three) times daily.    Historical Provider, MD  Ledipasvir-Sofosbuvir (HARVONI) 90-400 MG TABS Take 1 tablet by mouth daily. 12 weeks,    Historical Provider, MD  LORazepam (ATIVAN) 0.5 MG tablet Take 1 mg by mouth at bedtime.  09/16/15   Historical Provider, MD  magic mouthwash w/lidocaine SOLN Take 5 mLs by mouth  3 (three) times daily as needed for mouth pain. 04/16/16   Lloyd Huger, MD  magnesium oxide (MAG-OX) 400 MG tablet Take 800 mg by mouth 3 (three) times daily.     Historical Provider, MD  Melatonin 3 MG TABS Take 6 mg by mouth at bedtime.    Historical Provider, MD  meloxicam (MOBIC) 7.5 MG tablet Take 7.5 mg by mouth daily.  10/14/15   Historical Provider, MD  Multiple Vitamins-Minerals (MULTIVITAMIN WITH MINERALS) tablet Take 1 tablet by mouth daily.    Historical Provider, MD  ondansetron (ZOFRAN) 8 MG tablet Take 1 tablet (8 mg total) by mouth 2 (two) times daily as needed. 03/18/16   Lloyd Huger, MD   polyethylene glycol (MIRALAX / GLYCOLAX) packet Take 17 g by mouth daily.    Historical Provider, MD  prochlorperazine (COMPAZINE) 10 MG tablet Take 1 tablet (10 mg total) by mouth every 6 (six) hours as needed (Nausea or vomiting). 03/18/16   Lloyd Huger, MD  QUEtiapine (SEROQUEL) 50 MG tablet Take 100 mg by mouth at bedtime.  02/25/16   Historical Provider, MD  SPIRIVA HANDIHALER 18 MCG inhalation capsule Place 18 mcg into inhaler and inhale daily.  10/07/15   Historical Provider, MD  spironolactone (ALDACTONE) 50 MG tablet Take 50 mg by mouth daily.  10/14/15   Historical Provider, MD  sucralfate (CARAFATE) 1 g tablet Take 1 tablet (1 g total) by mouth 3 (three) times daily. Dissolve in 4 tbs warm water, swish and swallow 04/13/16   Noreene Filbert, MD  tamsulosin (FLOMAX) 0.4 MG CAPS capsule Take 0.4 mg by mouth 2 (two) times daily.  10/14/15   Historical Provider, MD  thiamine 100 MG tablet Take 100 mg by mouth daily.    Historical Provider, MD  traZODone (DESYREL) 50 MG tablet Take 50 mg by mouth at bedtime.    Historical Provider, MD  vitamin B-12 (CYANOCOBALAMIN) 1000 MCG tablet Take 1,000 mcg by mouth daily.    Historical Provider, MD    Allergies Penicillins    Social History Social History  Substance Use Topics  . Smoking status: Current Every Day Smoker    Packs/day: 0.50    Types: Cigarettes  . Smokeless tobacco: Never Used  . Alcohol use Yes    Review of Systems Patient denies headaches, rhinorrhea, blurry vision, numbness, shortness of breath, chest pain, edema, cough, abdominal pain, nausea, vomiting, diarrhea, dysuria, fevers, rashes or hallucinations unless otherwise stated above in HPI. ____________________________________________   PHYSICAL EXAM:  VITAL SIGNS: Vitals:   05/13/16 1900 05/13/16 1930  BP: 105/63 104/64  Pulse: (!) 55 61  Resp: 15 17  Temp:      Constitutional: Alert and oriented.  Head: Atraumatic. Nose: No  congestion/rhinnorhea. Mouth/Throat: Mucous membranes are moist.  Oropharynx non-erythematous. Neck: No stridor. Darkened skin 2/2 radiation changes Painless ROM. No cervical spine tenderness to palpation Hematological/Lymphatic/Immunilogical: No cervical lymphadenopathy. Cardiovascular: Normal rate, regular rhythm. Grossly normal heart sounds.  Good peripheral circulation. Respiratory: Normal respiratory effort.  No retractions. Lungs CTAB. Gastrointestinal: Soft and nontender. No distention. No abdominal bruits. No CVA tenderness. Musculoskeletal: No lower extremity tenderness nor edema.  No joint effusions. Neurologic:  Normal speech and language. No gross focal neurologic deficits are appreciated. No gait instability. Skin:  Skin is warm, dry and intact. No rash noted. Small superficial skin tear to left forearm,   Psychiatric: Mood and affect are normal. Speech and behavior are normal.  ____________________________________________   LABS (all labs ordered are  listed, but only abnormal results are displayed)  Results for orders placed or performed during the hospital encounter of 05/13/16 (from the past 24 hour(s))  Basic metabolic panel     Status: Abnormal   Collection Time: 05/13/16  6:49 PM  Result Value Ref Range   Sodium 133 (L) 135 - 145 mmol/L   Potassium 6.3 (HH) 3.5 - 5.1 mmol/L   Chloride 102 101 - 111 mmol/L   CO2 25 22 - 32 mmol/L   Glucose, Bld 97 65 - 99 mg/dL   BUN 36 (H) 6 - 20 mg/dL   Creatinine, Ser 1.70 (H) 0.61 - 1.24 mg/dL   Calcium 9.1 8.9 - 10.3 mg/dL   GFR calc non Af Amer 38 (L) >60 mL/min   GFR calc Af Amer 44 (L) >60 mL/min   Anion gap 6 5 - 15  CBC     Status: Abnormal   Collection Time: 05/13/16  6:49 PM  Result Value Ref Range   WBC 5.2 3.8 - 10.6 K/uL   RBC 3.17 (L) 4.40 - 5.90 MIL/uL   Hemoglobin 11.0 (L) 13.0 - 18.0 g/dL   HCT 31.1 (L) 40.0 - 52.0 %   MCV 98.1 80.0 - 100.0 fL   MCH 34.6 (H) 26.0 - 34.0 pg   MCHC 35.3 32.0 - 36.0 g/dL    RDW 16.0 (H) 11.5 - 14.5 %   Platelets 185 150 - 440 K/uL   ____________________________________________  EKG My review and personal interpretation at Time: 18:39   Indication: hyperkalemia  Rate: 55  Rhythm: normal Axis: normal Other: mild hyperacute t waves, normal intervals ____________________________________________  RADIOLOGY  I personally reviewed all radiographic images ordered to evaluate for the above acute complaints and reviewed radiology reports and findings.  These findings were personally discussed with the patient.  Please see medical record for radiology report.  ____________________________________________   PROCEDURES  Procedure(s) performed:  Procedures    Critical Care performed: CRITICAL CARE Performed by: Merlyn Lot   Total critical care time: 40 minutes  Critical care time was exclusive of separately billable procedures and treating other patients.  Critical care was necessary to treat or prevent imminent or life-threatening deterioration.  Critical care was time spent personally by me on the following activities: development of treatment plan with patient and/or surrogate as well as nursing, discussions with consultants, evaluation of patient's response to treatment, examination of patient, obtaining history from patient or surrogate, ordering and performing treatments and interventions, ordering and review of laboratory studies, ordering and review of radiographic studies, pulse oximetry and re-evaluation of patient's condition.  ____________________________________________   INITIAL IMPRESSION / ASSESSMENT AND PLAN / ED COURSE  Pertinent labs & imaging results that were available during my care of the patient were reviewed by me and considered in my medical decision making (see chart for details).  DDX: dehydration, electrolyte abn, fracture  [name] is a 71 y.o. who presents to the ED with fall from standing.  Patient arrives  afebrile hemodynamic stable. CT imaging ordered to evaluate for any evidence of traumatic injury shows no acute abnormality. Patient with laboratory evidence of acute kidney injury with associated worsening hyperkalemia with mild hyperacute T-wave abnormalities. Patient will be treated temporize for his hyperkalemia with insulin, calcium, albuterol and sodium bicarbonate.  Patient will need admission for further hemodynamic monitoring and management. Have discussed with the patient and available family all diagnostics and treatments performed thus far and all questions were answered to the best of my ability.  The patient demonstrates understanding and agreement with plan.       ____________________________________________   FINAL CLINICAL IMPRESSION(S) / ED DIAGNOSES  Final diagnoses:  Hyperkalemia  AKI (acute kidney injury) ([place])  Fall from standing, initial encounter      NEW MEDICATIONS STARTED DURING THIS VISIT:  New Prescriptions   No medications on file     Note:  This document was prepared using Dragon voice recognition software and may include unintentional dictation errors.    Merlyn Lot, MD 05/13/16 [phone]

## 2016-05-13 NOTE — ED Triage Notes (Addendum)
Pt presents to ED with c/o multiple falls, and is receiving chemo and radiation for throat cancer. Pt's group home is located on [address], reports missing chemo today due to abnormal labs. Per EMS called twice to residence, 2nd time was due to patient falling, per EMS patient was found in the bed and was ambulatory to EMS.

## 2016-05-13 NOTE — H&P (Addendum)
[place] [facility] [place] NAME: [name]    MR#:  [id number]  DATE OF BIRTH:  05-08-1945  DATE OF ADMISSION:  05/13/2016  PRIMARY CARE PHYSICIAN: Vista Mink, FNP   REQUESTING/REFERRING PHYSICIAN: Dr. Quentin Cornwall  CHIEF COMPLAINT:   Chief Complaint  Patient presents with  . Weakness    HISTORY OF PRESENT ILLNESS:  [name]  is a 71 y.o. male with a known history of COPD, stage IV laryngeal cancer, malnutrition, tobacco use presents to the emergency room sent in from group home due to recurrent falls. He has been found to have hyperkalemia on blood work with acute kidney injury. He has had decreased oral intake. Is also on Lasix, potassium supplements and Aldactone. He was at the cancer center today to see Dr. Grayland Ormond in the morning. His cisplatin chemotherapy has been discontinued due to worsening creatinine. Overall patient is a poor historian. For most of the questions he thinks and says "I don't know"  PAST MEDICAL HISTORY:   Past Medical History:  Diagnosis Date  . Alcohol abuse   . Cirrhosis ([place])   . COPD (chronic obstructive pulmonary disease) ([place])   . Hepatitis C   . Malnutrition ([place])   . Throat cancer ([place])     PAST SURGICAL HISTORY:   Past Surgical History:  Procedure Laterality Date  . TONSILLECTOMY     age was 53  . VASECTOMY      SOCIAL HISTORY:   Social History  Substance Use Topics  . Smoking status: Current Every Day Smoker    Packs/day: 0.50    Types: Cigarettes  . Smokeless tobacco: Never Used  . Alcohol use Yes    FAMILY HISTORY:   Family History  Problem Relation Age of Onset  . Breast cancer Mother     DRUG ALLERGIES:   Allergies  Allergen Reactions  . Penicillins Other (See Comments)    unknown    REVIEW OF SYSTEMS:   Review of Systems  Constitutional: Positive for malaise/fatigue and weight loss. Negative for chills and fever.  HENT: Negative for hearing loss and  nosebleeds.   Eyes: Negative for blurred vision, double vision and pain.  Respiratory: Negative for cough, hemoptysis, sputum production, shortness of breath and wheezing.   Cardiovascular: Negative for chest pain, palpitations, orthopnea and leg swelling.  Gastrointestinal: Negative for abdominal pain, constipation, diarrhea, nausea and vomiting.  Genitourinary: Negative for dysuria and hematuria.  Musculoskeletal: Positive for falls. Negative for back pain and myalgias.  Skin: Negative for rash.  Neurological: Positive for dizziness and weakness. Negative for tremors, sensory change, speech change, focal weakness, seizures and headaches.  Endo/Heme/Allergies: Does not bruise/bleed easily.  Psychiatric/Behavioral: Negative for depression and memory loss. The patient is not nervous/anxious.     MEDICATIONS AT HOME:   Prior to Admission medications   Medication Sig Start Date End Date Taking? Authorizing Provider  ADVAIR DISKUS 250-50 MCG/DOSE AEPB Inhale 2 puffs into the lungs daily. 08/29/15   Historical Provider, MD  albuterol (PROVENTIL HFA;VENTOLIN HFA) 108 (90 Base) MCG/ACT inhaler Inhale 2 puffs into the lungs every 6 (six) hours as needed for wheezing or shortness of breath.    Historical Provider, MD  alendronate (FOSAMAX) 70 MG tablet Take 70 mg by mouth once a week.  09/24/15   Historical Provider, MD  cholecalciferol (VITAMIN D) 1000 units tablet Take 5,000 Units by mouth daily.    Historical Provider, MD  donepezil (ARICEPT) 5 MG tablet Take 5 mg  by mouth at bedtime.  03/02/16   Historical Provider, MD  folic acid (FOLVITE) 1 MG tablet Take 1 mg by mouth daily.    Historical Provider, MD  furosemide (LASIX) 20 MG tablet Take 20 mg by mouth daily.  10/14/15   Historical Provider, MD  lactulose (CHRONULAC) 10 GM/15ML solution Take 20 g by mouth 3 (three) times daily.    Historical Provider, MD  Ledipasvir-Sofosbuvir (HARVONI) 90-400 MG TABS Take 1 tablet by mouth daily. 12 weeks,     Historical Provider, MD  LORazepam (ATIVAN) 0.5 MG tablet Take 1 mg by mouth at bedtime.  09/16/15   Historical Provider, MD  magic mouthwash w/lidocaine SOLN Take 5 mLs by mouth 3 (three) times daily as needed for mouth pain. 04/16/16   Lloyd Huger, MD  magnesium oxide (MAG-OX) 400 MG tablet Take 800 mg by mouth 3 (three) times daily.     Historical Provider, MD  Melatonin 3 MG TABS Take 6 mg by mouth at bedtime.    Historical Provider, MD  meloxicam (MOBIC) 7.5 MG tablet Take 7.5 mg by mouth daily.  10/14/15   Historical Provider, MD  Multiple Vitamins-Minerals (MULTIVITAMIN WITH MINERALS) tablet Take 1 tablet by mouth daily.    Historical Provider, MD  nadolol (CORGARD) 20 MG tablet Take 10 mg by mouth daily.  09/16/15   Historical Provider, MD  ondansetron (ZOFRAN) 8 MG tablet Take 1 tablet (8 mg total) by mouth 2 (two) times daily as needed. 03/18/16   Lloyd Huger, MD  polyethylene glycol (MIRALAX / GLYCOLAX) packet Take 17 g by mouth daily.    Historical Provider, MD  potassium chloride (K-DUR) 10 MEQ tablet Take 20 mEq by mouth daily.  10/14/15   Historical Provider, MD  prochlorperazine (COMPAZINE) 10 MG tablet Take 1 tablet (10 mg total) by mouth every 6 (six) hours as needed (Nausea or vomiting). 03/18/16   Lloyd Huger, MD  QUEtiapine (SEROQUEL) 50 MG tablet Take 100 mg by mouth at bedtime.  02/25/16   Historical Provider, MD  SPIRIVA HANDIHALER 18 MCG inhalation capsule Place 18 mcg into inhaler and inhale daily.  10/07/15   Historical Provider, MD  spironolactone (ALDACTONE) 50 MG tablet Take 50 mg by mouth daily.  10/14/15   Historical Provider, MD  sucralfate (CARAFATE) 1 g tablet Take 1 tablet (1 g total) by mouth 3 (three) times daily. Dissolve in 4 tbs warm water, swish and swallow 04/13/16   Noreene Filbert, MD  tamsulosin (FLOMAX) 0.4 MG CAPS capsule Take 0.4 mg by mouth 2 (two) times daily.  10/14/15   Historical Provider, MD  thiamine 100 MG tablet Take 100 mg by mouth  daily.    Historical Provider, MD  traZODone (DESYREL) 50 MG tablet Take 50 mg by mouth at bedtime.    Historical Provider, MD  venlafaxine XR (EFFEXOR-XR) 150 MG 24 hr capsule Take 150 mg by mouth daily with breakfast.  03/09/16   Historical Provider, MD  vitamin B-12 (CYANOCOBALAMIN) 1000 MCG tablet Take 1,000 mcg by mouth daily.    Historical Provider, MD     VITAL SIGNS:  Blood pressure 104/64, pulse 61, temperature 98 F (36.7 C), temperature source Oral, resp. rate 17, height '5\' 7"'$  (1.702 m), weight 52.6 kg (116 lb), SpO2 97 %.  PHYSICAL EXAMINATION:  Physical Exam  GENERAL:  71 y.o.-year-old patient lying in the bed with no acute distress. Thin and frail EYES: Pupils equal, round, reactive to light and accommodation. No scleral icterus. Extraocular  muscles intact. HEENT: Head atraumatic, normocephalic. Oropharynx and nasopharynx clear. No oropharyngeal erythema, moist oral mucosa. NECK:  Supple, no jugular venous distention. No thyroid enlargement, no tenderness.  LUNGS: Normal breath sounds bilaterally, no wheezing, rales, rhonchi. No use of accessory muscles of respiration.  CARDIOVASCULAR: S1, S2 normal. No murmurs, rubs, or gallops.  ABDOMEN: Soft, nontender, nondistended. Bowel sounds present. No organomegaly or mass.  EXTREMITIES: No pedal edema, cyanosis, or clubbing. + 2 pedal & radial pulses b/l.   NEUROLOGIC: Cranial nerves II through XII are intact. No focal Motor or sensory deficits appreciated b/l PSYCHIATRIC: The patient is alert and awake. SKIN: No obvious rash, lesion, or ulcer.   LABORATORY PANEL:   CBC  Recent Labs Lab 05/13/16 1849  WBC 5.2  HGB 11.0*  HCT 31.1*  PLT 185   ------------------------------------------------------------------------------------------------------------------  Chemistries   Recent Labs Lab 05/13/16 1849  NA 133*  K 6.3*  CL 102  CO2 25  GLUCOSE 97  BUN 36*  CREATININE 1.70*  CALCIUM 9.1    ------------------------------------------------------------------------------------------------------------------  Cardiac Enzymes No results for input(s): TROPONINI in the last 168 hours. ------------------------------------------------------------------------------------------------------------------  RADIOLOGY:  Ct Head Wo Contrast  Result Date: 05/13/2016 CLINICAL DATA:  Throat cancer with multiple falls. EXAM: CT HEAD WITHOUT CONTRAST TECHNIQUE: Contiguous axial images were obtained from the base of the skull through the vertex without intravenous contrast. COMPARISON:  10/21/2015 CT head FINDINGS: Brain: Superficial and central atrophy is again noted with chronic small vessel ischemic disease of periventricular white matter. Encephalomalacia with ex vacuo dilatation of the anterior horn of the right lateral ventricle secondary to remote right temporoparietal infarct remains unchanged. No new large vascular territory infarction, intra-axial mass nor extra-axial fluid collections are noted. Vascular: Atherosclerosis of the vertebral arteries and carotid siphons bilaterally. No hyperdense vessels. Skull: Normal. Negative for fracture or focal lesion. Sinuses/Orbits: No acute finding. Other: None. 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 No acute intracranial abnormality. Chronic small vessel ischemic disease of periventricular white matter. Electronically Signed   By: Ashley Royalty M.D.   On: 05/13/2016 19:35     IMPRESSION AND PLAN:   * AKI with hyperkalemia Due to dehydration. Decreased oral intake. Patient is also on Lasix at home. Along with potassium supplements and Aldactone. Hold these medications. Normal saline bolus now. IV insulin and dextrose. Dose of Kayexalate. Repeat labs in 2 hours. Telemetry monitoring.  * Hyponatremia. Patient has chronic mild hyponatremia. Should  improve with IV fluids.  * COPD. Stable. No wheezing. Continue nebulizers when necessary.  * Stage IV laryngeal cancer Patient is undergoing chemotherapy. He saw Dr. Grayland Ormond earlier today. Due to worsening creatinine cisplatin on hold. Patient is from group home. As per oncology note he is a ward of the state. We'll consult palliative care for goals of care.  * Dysphagia Recently had a modified barium swallow evaluation as outpatient. Patient had difficulty initiating swallowing but did not have any problems swallowing after that. Start regular diet.  * Falls due to gait abnormalities and weakness. Physical therapy consult.  * DVT prophylaxis with Lovenox  All the records are reviewed and case discussed with ED provider. Management plans discussed with the patient, family and they are in agreement.  CODE STATUS: FULL CODE  TOTAL TIME TAKING CARE OF THIS PATIENT: 40 minutes.   Hillary Bow R M.D on 05/13/2016 at 7:40 PM  Between 7am to 6pm - Pager - [phone]  After  6pm go to [email or web address] - password [email or web address] [facility]  Office  [phone]  CC: Primary care physician; Vista Mink, FNP  Note: This dictation was prepared with Dragon dictation along with smaller phrase technology. Any transcriptional errors that result from this process are unintentional.

## 2016-05-13 NOTE — ED Notes (Signed)
Admitting MD at the bedside for pt evaluation.  

## 2016-05-13 NOTE — Progress Notes (Signed)
Throat hurting today.

## 2016-05-14 ENCOUNTER — Ambulatory Visit: Payer: Medicare Other

## 2016-05-14 ENCOUNTER — Ambulatory Visit: Admission: RE | Admit: 2016-05-14 | Payer: Medicare Other | Source: Ambulatory Visit

## 2016-05-14 DIAGNOSIS — Z515 Encounter for palliative care: Secondary | ICD-10-CM

## 2016-05-14 DIAGNOSIS — N179 Acute kidney failure, unspecified: Principal | ICD-10-CM

## 2016-05-14 DIAGNOSIS — Z7189 Other specified counseling: Secondary | ICD-10-CM

## 2016-05-14 DIAGNOSIS — E43 Unspecified severe protein-calorie malnutrition: Secondary | ICD-10-CM | POA: Insufficient documentation

## 2016-05-14 DIAGNOSIS — C140 Malignant neoplasm of pharynx, unspecified: Secondary | ICD-10-CM

## 2016-05-14 LAB — BASIC METABOLIC PANEL WITH GFR
Anion gap: 5 (ref 5–15)
BUN: 25 mg/dL — ABNORMAL HIGH (ref 6–20)
CO2: 24 mmol/L (ref 22–32)
Calcium: 8.5 mg/dL — ABNORMAL LOW (ref 8.9–10.3)
Chloride: 109 mmol/L (ref 101–111)
Creatinine, Ser: 1.34 mg/dL — ABNORMAL HIGH (ref 0.61–1.24)
GFR calc Af Amer: 59 mL/min — ABNORMAL LOW
GFR calc non Af Amer: 51 mL/min — ABNORMAL LOW
Glucose, Bld: 107 mg/dL — ABNORMAL HIGH (ref 65–99)
Potassium: 4.9 mmol/L (ref 3.5–5.1)
Sodium: 138 mmol/L (ref 135–145)

## 2016-05-14 LAB — MRSA PCR SCREENING: MRSA BY PCR: NEGATIVE

## 2016-05-14 MED ORDER — VENLAFAXINE HCL ER 75 MG PO CP24
150.0000 mg | ORAL_CAPSULE | Freq: Every day | ORAL | Status: DC
  Administered 2016-05-15: 150 mg via ORAL
  Filled 2016-05-14: qty 2

## 2016-05-14 MED ORDER — ALUM & MAG HYDROXIDE-SIMETH 200-200-20 MG/5ML PO SUSP
15.0000 mL | ORAL | Status: DC | PRN
  Administered 2016-05-14: 15 mL via ORAL
  Filled 2016-05-14: qty 30

## 2016-05-14 MED ORDER — ENSURE ENLIVE PO LIQD
237.0000 mL | Freq: Three times a day (TID) | ORAL | Status: DC
  Administered 2016-05-14 – 2016-05-15 (×3): 237 mL via ORAL

## 2016-05-14 MED ORDER — INFLUENZA VAC SPLIT QUAD 0.5 ML IM SUSY: 0.5000 mL | PREFILLED_SYRINGE | INTRAMUSCULAR | Status: DC

## 2016-05-14 MED ORDER — PNEUMOCOCCAL VAC POLYVALENT 25 MCG/0.5ML IJ INJ: 0.5000 mL | INJECTION | INTRAMUSCULAR | Status: DC

## 2016-05-14 NOTE — Progress Notes (Signed)
Initial Nutrition Assessment  DOCUMENTATION CODES:   Severe malnutrition in context of chronic illness  INTERVENTION:  - Ensure Enlive TID, each supplement provides 350 calories and 20 grams protein  NUTRITION DIAGNOSIS:   Malnutrition (Severe) related to chronic illness (Laryngeal Cancer) as evidenced by severe depletion of muscle mass, severe depletion of body fat, percent weight loss (15% in 1 month).  GOAL:   Patient will meet greater than or equal to 90% of their needs  MONITOR:   PO intake, Supplement acceptance, Weight trends, I & O's, Labs  REASON FOR ASSESSMENT:   Malnutrition Screening Tool    ASSESSMENT:   71 y.o. male with a known history of COPD, stage IV laryngeal cancer, malnutrition, tobacco use presents from group home due to recurrent falls. He has been found to have hyperkalemia on blood work with acute kidney injury. He has had decreased oral intake. Pt's cisplatin chemotherapy has been discontinued due to worsening creatinine.  Overall pt was a poor historian and did not elaborate.  Pt reports consuming almost of all his meal trays this admission, per chart review pt only consumed negligeble sips and bites for breakfast this morning. Pt did not elaborate how he was eating PTA.  Pt reports weight loss of 50 lbs in 1 year. Per chart review pt has lost 20 lbs in three months (15%-significant for time frame)  Pt reports consuming Ensure or Boost PTA and is willing to continue supplementation while admitted.  Labs and medications reviewed  Nutrition-focused physical exam completed. Findings include severe fat depletion, severe muscle depletion and no edema.  Diet Order:  Diet regular Room service appropriate? Yes; Fluid consistency: Thin  Skin:  Reviewed, no issues  Last BM:  3/29  Height:   Ht Readings from Last 1 Encounters:  05/13/16 '5\' 7"'$  (1.702 m)    Weight:   Wt Readings from Last 1 Encounters:  05/13/16 110 lb 3.2 oz (50 kg)    Ideal  Body Weight:  67 kg  BMI:  Body mass index is 17.26 kg/m.  Estimated Nutritional Needs:   Kcal:  1650-1850  Protein:  60-70 grams  Fluid:  >/= 1.5 L/d  EDUCATION NEEDS:   Education needs no appropriate at this time  [employer]

## 2016-05-14 NOTE — Progress Notes (Signed)
[facility] [place] NAME: [name]    MR#:  [id number]  DATE OF BIRTH:  Nov 02, 1945  SUBJECTIVE:  CHIEF COMPLAINT:    REVIEW OF SYSTEMS:  CONSTITUTIONAL: No fever, fatigue or weakness.  EYES: No blurred or double vision.  EARS, NOSE, AND THROAT: No tinnitus or ear pain.  RESPIRATORY: No cough, shortness of breath, wheezing or hemoptysis.  CARDIOVASCULAR: No chest pain, orthopnea, edema.  GASTROINTESTINAL: No nausea, vomiting, diarrhea or abdominal pain.  GENITOURINARY: No dysuria, hematuria.  ENDOCRINE: No polyuria, nocturia,  HEMATOLOGY: No anemia, easy bruising or bleeding SKIN: No rash or lesion. MUSCULOSKELETAL: No joint pain or arthritis.   NEUROLOGIC: No tingling, numbness, weakness.  PSYCHIATRY: No anxiety or depression.   DRUG ALLERGIES:   Allergies  Allergen Reactions  . Penicillins Other (See Comments)    unknown    VITALS:  Blood pressure (!) 109/54, pulse 70, temperature 98.4 F (36.9 C), temperature source Oral, resp. rate 18, height '5\' 7"'$  (1.702 m), weight 50 kg (110 lb 3.2 oz), SpO2 100 %.  PHYSICAL EXAMINATION:  GENERAL:  71 y.o.-year-old patient lying in the bed with no acute distress.  EYES: Pupils equal, round, reactive to light and accommodation. No scleral icterus. Extraocular muscles intact.  HEENT: Head atraumatic, normocephalic. Oropharynx and nasopharynx clear.  NECK:  Supple, no jugular venous distention. No thyroid enlargement, no tenderness.  LUNGS: Normal breath sounds bilaterally, no wheezing, rales,rhonchi or crepitation. No use of accessory muscles of respiration.  CARDIOVASCULAR: S1, S2 normal. No murmurs, rubs, or gallops.  ABDOMEN: Soft, nontender, nondistended. Bowel sounds present. No organomegaly or mass.  EXTREMITIES: No pedal edema, cyanosis, or clubbing.  NEUROLOGIC: Cranial nerves II through XII are intact. Muscle strength 5/5 in all extremities. Sensation intact. Gait not  checked.  PSYCHIATRIC: The patient is alert and oriented x 3.  SKIN: No obvious rash, lesion, or ulcer.    LABORATORY PANEL:   CBC  Recent Labs Lab 05/13/16 1849  WBC 5.2  HGB 11.0*  HCT 31.1*  PLT 185   ------------------------------------------------------------------------------------------------------------------  Chemistries   Recent Labs Lab 05/14/16 0436  NA 138  K 4.9  CL 109  CO2 24  GLUCOSE 107*  BUN 25*  CREATININE 1.34*  CALCIUM 8.5*   ------------------------------------------------------------------------------------------------------------------  Cardiac Enzymes No results for input(s): TROPONINI in the last 168 hours. ------------------------------------------------------------------------------------------------------------------  RADIOLOGY:  Ct Head Wo Contrast  Result Date: 05/13/2016 CLINICAL DATA:  Throat cancer with multiple falls. EXAM: CT HEAD WITHOUT CONTRAST TECHNIQUE: Contiguous axial images were obtained from the base of the skull through the vertex without intravenous contrast. COMPARISON:  10/21/2015 CT head FINDINGS: Brain: Superficial and central atrophy is again noted with chronic small vessel ischemic disease of periventricular white matter. Encephalomalacia with ex vacuo dilatation of the anterior horn of the right lateral ventricle secondary to remote right temporoparietal infarct remains unchanged. No new large vascular territory infarction, intra-axial mass nor extra-axial fluid collections are noted. Vascular: Atherosclerosis of the vertebral arteries and carotid siphons bilaterally. No hyperdense vessels. Skull: Normal. Negative for fracture or focal lesion. Sinuses/Orbits: No acute finding. Other: None. 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 No acute intracranial abnormality. Chronic small vessel ischemic disease of periventricular  white matter. Electronically Signed   By: Ashley Royalty M.D.   On: 05/13/2016 19:35    EKG:   Orders placed or performed during the hospital encounter of 05/13/16  .  ED EKG  . ED EKG    ASSESSMENT AND PLAN:   * AKI -Prerenal secondary to dehydration Clinically improving with IV fluids Avoid nephrotoxins Hold Lasix  *Hyperkalemia Resolved potassium at 4.5 today  hold potassium supplements and aldactone Patient was given Kayexalate.  * Hyponatremia. Patient has chronic mild hyponatremia.  Improved with IV fluids sodium at 138 today  * COPD. Stable. No wheezing. Continue nebulizers when necessary.  * Stage IV laryngeal cancer Patient is undergoing chemotherapy. He saw Dr. Grayland Ormond earlier on March 28. Due to worsening creatinine cisplatin on hold. Patient is from group home. As per oncology note he is a ward of the state.  We'llfollow up with  palliative care for goals of care.  * Dysphagia Recently had a modified barium swallow evaluation as outpatient. Patient had difficulty initiating swallowing but did not have any problems swallowing after that. Start regular diet.  * Falls due to gait abnormalities and weakness. Physical therapy-recommended home health PT  * DVT prophylaxis with Lovenox     All the records are reviewed and case discussed with Care Management/Social Workerr. Management plans discussed with the patient, family and they are in agreement.  CODE STATUS: fc  TOTAL TIME TAKING CARE OF THIS PATIENT: 36 minutes.   POSSIBLE D/C IN 1-2 DAYS, DEPENDING ON CLINICAL CONDITION.  Note: This dictation was prepared with Dragon dictation along with smaller phrase technology. Any transcriptional errors that result from this process are unintentional.   Nicholes Mango M.D on 05/14/2016 at 3:42 PM  Between 7am to 6pm - Pager - [phone] After 6pm go to [email or web address] - password [email or web address] [facility]  Office  [phone]  CC: Primary  care physician; Vista Mink, FNP

## 2016-05-14 NOTE — Care Management (Signed)
Heads up referral to [facility] for [facility]. Now see that physical therapy is also recommended.  REferral has also been made for outpatient palliative care through [facility].  [facility] unable to accept referral for  home health.

## 2016-05-14 NOTE — Consult Note (Signed)
Consultation Note Date: 05/14/2016   Patient Name: [name]  DOB: [birth year]  MRN: [id number]  Age / Sex: 71 y.o., male  PCP: Vista Mink, [facility] Referring Physician: Nicholes Mango, MD  Reason for Consultation: Establishing goals of care  HPI/Patient Profile: 71 y.o. male  with past medical history of cirrhosis, alcoholism, tobacco abuse, COPD, and stage IV throat cancer and dementia, who is a ward of the state.  He was admitted on 05/13/2016 with hyperkalemia and acute kidney injury. He has been on chemotherapy and diuretics.  He has also had significant difficulty eating lately.   Clinical Assessment and Goals of Care: I met at bedside with the patient who tells me he is ready for breakfast and asks for applesauce.  He denies pain.  He requests his glasses so that he can read.  I asked him about his throat cancer and he replies "I just keep taking treatments and go on".  He sister, [name], met with me at bedside.  We discussed his acute kidney failure - likely from poor PO intake, diuretic medications and chemotherapy.  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  We discussed code status and [name] was supportive of DNR.  I mentioned hospice services would be appropriate at some point in the near future.  Pat asked about discharge.  She felt that maybe a more structured environment would be helpful for [name].  Later in the day, [name] from the department of social services came to see [name].  We met together with [name] to discuss [name] hospitalization and plan going forward.  We reviewed [name]'s health history and his current acute kidney injury.  We completed a MOST form.  [name] and Levada Dy were in agreement with the medical recommendations of DNR, limited interventions, and no PEG tube.    After  our meeting I talked with Dr. Baruch Gouty, radiation oncology, about radiation therapy in the future.  [name] has 7 more treatments and Dr. Baruch Gouty recommended that he return to continue treatments on Monday (4/2).  Primary Decision Maker:  LEGAL GUARDIAN, [name]    SUMMARY OF RECOMMENDATIONS   MOST form completed.  DSS guardian has taken it to get it signed/approved by DSS.  DNR after MOST is approved  D/C to group home.  With home health services and Palliative Care to follow outpatient.  Follow up with Radiation on Monday 4/2   Symptom Management:   Per primary team.  Continue magic mouthwash.  Additional Recommendations (Limitations, Scope, Preferences):  Minimize Medications  Palliative Prophylaxis:   Aspiration precautions  Prognosis:   Given malnourished state, cirrhosis, ongoing ETOH and tobacco abuse, and stage IV cancer of the larynx, if no further chemo/rad tx may have less than 6 mos.  Discharge Planning: Home with Home Health and Palliative outpatient follow up      Primary Diagnoses: Present on Admission: . AKI (acute kidney injury) ([place])  I have reviewed the medical record, interviewed the patient and family, and examined the patient. The following aspects are pertinent.  Past Medical History:  Diagnosis Date  . Alcohol abuse   . Cirrhosis ([place])   . COPD (chronic obstructive pulmonary disease) ([place])   . Hepatitis C   . Malnutrition ([place])   . Throat cancer [facility])    Social History   Social History  . Marital status: Divorced    Spouse name: N/A  . Number of children: N/A  . Years of education: N/A   Social History Main Topics  . Smoking status: Current Every Day Smoker    Packs/day: 0.50    Years: 63.00    Types: Cigarettes  . Smokeless tobacco: Never Used  . Alcohol use 0.6 oz/week    1 Cans of beer per week  . Drug use: No  . Sexual activity: Not Asked   Other Topics Concern  . None   Social History Narrative  . None    Family History  Problem Relation Age of Onset  . Breast cancer Mother    Scheduled Meds: . albuterol  5 mg Nebulization Once  . donepezil  5 mg Oral QHS  . enoxaparin (LOVENOX) injection  30 mg Subcutaneous Q24H  . [START ON 05/15/2016] Influenza vac split quadrivalent PF  0.5 mL Intramuscular Tomorrow-1000  . lactulose  20 g Oral BID  . mometasone-formoterol  2 puff Inhalation BID  . [START ON 05/15/2016] pneumococcal 23 valent vaccine  0.5 mL Intramuscular Tomorrow-1000  . QUEtiapine  50 mg Oral QHS  . sodium chloride flush  3 mL Intravenous Q12H  . tamsulosin  0.4 mg Oral Daily  . tiotropium  18 mcg Inhalation Daily  . traZODone  50 mg Oral QHS   Continuous Infusions: . sodium chloride 125 mL/hr at 05/14/16 0744   PRN Meds:.acetaminophen **OR** acetaminophen, albuterol, ondansetron **OR** ondansetron (ZOFRAN) IV, oxyCODONE, polyethylene glycol Allergies  Allergen Reactions  . Penicillins Other (See Comments)    unknown   Review of Systems patient denies pain, discomfort  Physical Exam  Thin frail male, appears chronically ill.  NAD CV rrr resp CTA no distress Abdomen soft, nt, nd, +bs Ext no edema, able to move all 4 Skin several small lesions on extremities  Vital Signs: BP (!) 103/38   Pulse 75   Temp 99.2 F (37.3 C) (Oral)   Resp 18   Ht _0  (1.702 m)   Wt 50 kg (110 lb 3.2 oz)   SpO2 97%   BMI 17.26 kg/m  Pain Assessment: 0-10   Pain Score: 0-No pain   SpO2: SpO2: 97 % O2 Device:SpO2: 97 % O2 Flow Rate: .   IO: Intake/output summary:  Intake/Output Summary (Last 24 hours) at 05/14/16 0836 Last data filed at 05/14/16 0636  Gross per 24 hour  Intake             2100 ml  Output              601 ml  Net             1499 ml    LBM:   Baseline Weight: Weight: 52.6 kg (116 lb) Most recent weight: Weight: 50 kg (110 lb 3.2 oz)     Palliative Assessment/Data:     Time In: 8:30 Time Out: 9:00 Time In :10:30 Time out:  11:15 Time in:  2:00 Time out:  2:30  Time Total: 90 min. Greater than 50%  of this  time was spent counseling and coordinating care related to the above assessment and plan.  Signed by: Imogene Burn, PA-C Palliative Medicine Pager: [phone]  Please contact Palliative Medicine Team phone at [phone] for questions and concerns.  For individual provider: See Shea Evans

## 2016-05-14 NOTE — Care Management (Signed)
Found that patient is from [facility] and [name] is patient's legal guardian.  Palliative care is reaching out to the guardian for care decisions in this patient with stage IV laryngeal cancer.  Was currently undergoing chemo which was placed on hold due to renal decline.  CSW referral placed.

## 2016-05-14 NOTE — Evaluation (Signed)
Physical Therapy Evaluation Patient Details Name: [name] MRN: [id number] DOB: 06-05-1945 Today's Date: 05/14/2016   History of Present Illness  Pt is a 71 yo male w/ current stage IV laryngeal cancer, he has been receiving chemotherapy and radiation PTA, he currently presents to [facility] w/ multiple falls and found to have acute kidney injury and hyperkalemia. PMH includes COPD, tobacco and alohol abuse, cirrhosis, and hepatitis C    Clinical Impression  Pt willing to participate in PT evaluation, able to follow basic commands and demonstrated good participation throughout. Per patient; he does not use any AD at baseline and is able to perform his own ADLs, comes from [facility] (ALF), difficult to determine his PLOF as patient is a poor historian. Overall strength appears WFLs for tasked assessed and patient is able to transfer OOB independently under PT supervision. Pt also able to perform toileting and hygiene w/ supervision, he attempted voiding 3x but was unsuccessful. He ambulated around nursing station w/o AD (supervision). He walks w/ safe symmetrical gait pattern and no LOB or staggering was noted, states he feels close to his baseline level of function. Pt may benefit from skilled PT due to recent history of falls to ensure safe return to his previous level of function. Recommend pt return to ALF w/ HHPT following acute hospital stay.     Follow Up Recommendations Home health PT    Equipment Recommendations       Recommendations for Other Services       Precautions / Restrictions Precautions Precautions: None Restrictions Weight Bearing Restrictions: No      Mobility  Bed Mobility Overal bed mobility: Independent             General bed mobility comments: able to move in and out of bed w/ little difficulty and safe techniques  Transfers Overall transfer level: Needs assistance   Transfers: Sit to/from Stand Sit to Stand: Supervision         General  transfer comment: pt demonstrated good safe technique able to transfer to and from [facility], chair and bed w/o difficulty  Ambulation/Gait Ambulation/Gait assistance: Min guard Ambulation Distance (Feet): 200 Feet Assistive device: None Gait Pattern/deviations: Step-through pattern Gait velocity: good for household ambulation   General Gait Details: pt ambulated around nursing station w/o AD, ambulated w/ symmetrical gait pattern, w minimal lateral swaying, no LOB or buckling noted  Stairs            Wheelchair Mobility    Modified Rankin (Stroke Patients Only)       Balance Overall balance assessment: Needs assistance;History of Falls Sitting-balance support: Feet supported;No upper extremity supported Sitting balance-Leahy Scale: Good Sitting balance - Comments: able to maintain upright seated posture w/o back support   Standing balance support: No upper extremity supported Standing balance-Leahy Scale: Good Standing balance comment: pt stable in standing able to wash hands and attempt voiding over the toilet, no LOB or staggering noted                              Pertinent Vitals/Pain Pain Assessment: No/denies pain    Home Living Family/patient expects to be discharged to:: Assisted living (From [facility])               Home Equipment: None      Prior Function Level of Independence: Independent         Comments: pt reports that his independent in  ADLs and ambulation and does not normally use an AD     Hand Dominance        Extremity/Trunk Assessment   Upper Extremity Assessment Upper Extremity Assessment: Overall WFL for tasks assessed    Lower Extremity Assessment Lower Extremity Assessment: Overall WFL for tasks assessed       Communication   Communication: No difficulties  Cognition Arousal/Alertness: Awake/alert Behavior During Therapy: Flat affect Overall Cognitive Status: No family/caregiver present to determine  baseline cognitive functioning                                 General Comments: pt poor historian in recalling events, slighly impulsive at times but able to follow basic commands       General Comments      Exercises     Assessment/Plan    PT Assessment Patient needs continued PT services  PT Problem List Decreased balance;Decreased mobility       PT Treatment Interventions Gait training;Balance training;Functional mobility training;Therapeutic activities    PT Goals (Current goals can be found in the Care Plan section)  Acute Rehab PT Goals Patient Stated Goal: To get better  PT Goal Formulation: With patient Time For Goal Achievement: 05/28/16 Potential to Achieve Goals: Good    Frequency Min 2X/week   Barriers to discharge        Co-evaluation               End of Session Equipment Utilized During Treatment: Gait belt Activity Tolerance: Patient tolerated treatment well Patient left: in bed;with call bell/phone within reach;with bed alarm set   PT Visit Diagnosis: History of falling (Z91.81)    Time: 2637-8588 PT Time Calculation (min) (ACUTE ONLY): 29 min   Charges:         PT G Codes:        [name] Student PT 05/14/16, 12:41 PM [phone]   Katleen Carraway 05/14/2016, 12:30 PM

## 2016-05-14 NOTE — Progress Notes (Signed)
New referral for Home Palliative services  at [facility] following discharge received from [place]. Patient to discharge back to [place] home. Referral made aware. Discharge date not known at this time. Thank you. Flo Shanks RN, BSN, [facility] [place], hospital Liaison [phone] c

## 2016-05-15 ENCOUNTER — Ambulatory Visit: Payer: Medicare Other

## 2016-05-15 LAB — BASIC METABOLIC PANEL
Anion gap: 4 — ABNORMAL LOW (ref 5–15)
BUN: 13 mg/dL (ref 6–20)
CO2: 24 mmol/L (ref 22–32)
CREATININE: 0.87 mg/dL (ref 0.61–1.24)
Calcium: 8.4 mg/dL — ABNORMAL LOW (ref 8.9–10.3)
Chloride: 110 mmol/L (ref 101–111)
GFR calc Af Amer: >60 mL/min (ref >60)
Glucose, Bld: 99 mg/dL (ref 65–99)
Potassium: 4.1 mmol/L (ref 3.5–5.1)
SODIUM: 138 mmol/L (ref 135–145)

## 2016-05-15 LAB — CBC
HCT: 25.5 % — ABNORMAL LOW (ref 40.0–52.0)
Hemoglobin: 9.2 g/dL — ABNORMAL LOW (ref 13.0–18.0)
MCH: 35 pg — AB (ref 26.0–34.0)
MCHC: 36 g/dL (ref 32.0–36.0)
MCV: 97.2 fL (ref 80.0–100.0)
PLATELETS: 167 10*3/uL (ref 150–440)
RBC: 2.62 MIL/uL — ABNORMAL LOW (ref 4.40–5.90)
RDW: 15.6 % — ABNORMAL HIGH (ref 11.5–14.5)
WBC: 6 10*3/uL (ref 3.8–10.6)

## 2016-05-15 MED ORDER — ENSURE ENLIVE PO LIQD: 237.0000 mL | Freq: Three times a day (TID) | ORAL | 12 refills | Status: DC

## 2016-05-15 MED ORDER — ALUM & MAG HYDROXIDE-SIMETH 200-200-20 MG/5ML PO SUSP: 15.0000 mL | ORAL | 0 refills | Status: DC | PRN

## 2016-05-15 MED ORDER — BOOST BREEZE PO LIQD: 1.0000 | Freq: Three times a day (TID) | ORAL | 0 refills | Status: DC

## 2016-05-15 NOTE — Discharge Summary (Addendum)
[facility] [place] NAME: [name]    MR#:  [id number]  DATE OF BIRTH:  1945-05-22  DATE OF ADMISSION:  05/13/2016 ADMITTING PHYSICIAN: Hillary Bow, MD  DATE OF DISCHARGE: 05/15/16 PRIMARY CARE PHYSICIAN: Vista Mink, FNP    ADMISSION DIAGNOSIS:  Hyperkalemia [E87.5] AKI (acute kidney injury) ([place]) [N17.9] Fall from standing, initial encounter [W19.XXXA]  DISCHARGE DIAGNOSIS:  Active Problems:   AKI (acute kidney injury) ([place])   Throat cancer ([place])   Palliative care encounter   Protein-calorie malnutrition, severe   SECONDARY DIAGNOSIS:   Past Medical History:  Diagnosis Date  . Alcohol abuse   . Cirrhosis ([place])   . COPD (chronic obstructive pulmonary disease) ([place])   . Hepatitis C   . Malnutrition ([place])   . Throat cancer [facility])     HOSPITAL COURSE:   HPI [name]  is a 71 y.o. male with a known history of COPD, stage IV laryngeal cancer, malnutrition, tobacco use presents to the emergency room sent in from group home due to recurrent falls. He has been found to have hyperkalemia on blood work with acute kidney injury. He has had decreased oral intake. Is also on Lasix, potassium supplements and Aldactone. He was at the cancer center today to see Dr. Grayland Ormond in the morning. His cisplatin chemotherapy has been discontinued due to worsening creatinine. Overall patient is a poor historian. For most of the questions he thinks and says "I don't know"  * AKI -Prerenal secondary to dehydration resolved Clinically improved with IV fluids Avoid nephrotoxins Resume lasix,Discontinuing potassium supplements as patient is on aldactone   *Hyperkalemia Resolved potassium at 4.5 today  hold potassium supplements and resume aldactone Patient was given Kayexalate.  * Hyponatremia. Patient has chronic mild hyponatremia.  Improved with IV fluids sodium at 138 today  * COPD. Stable. No wheezing. Continue  nebulizers when necessary.  * Stage IV laryngeal cancer Patient is undergoing chemotherapy. He saw Dr. Grayland Ormond earlier on March 28. Due to worsening creatinine cisplatin on hold. Patient is from group home. As per oncology note he is a ward of the state.   palliative care discussed with the patient's sister,MOST form signed, palliative care to follow-up with the patient at group home  * Dysphagia Recently had a modified barium swallow evaluation as outpatient. Patient had difficulty initiating swallowing but did not have any problems swallowing after that. Start regular diet.  * Falls due to gait abnormalities and weakness. Physical therapy-recommended home health PT  * DVT prophylaxis with Lovenox   DISCHARGE CONDITIONS:   fair  CONSULTS OBTAINED:     PROCEDURES none   DRUG ALLERGIES:   Allergies  Allergen Reactions  . Penicillins Other (See Comments)    unknown    DISCHARGE MEDICATIONS:   Current Discharge Medication List    START taking these medications   Details  alum & mag hydroxide-simeth (MAALOX/MYLANTA) 200-200-20 MG/5ML suspension Take 15 mLs by mouth as needed for indigestion or heartburn. Qty: 355 mL, Refills: 0    Nutritional Supplements (FEEDING SUPPLEMENT, BOOST BREEZE,) LIQD Take 1 Can by mouth 3 (three) times daily between meals. Qty: 90 Can, Refills: 0      CONTINUE these medications which have NOT CHANGED   Details  ADVAIR DISKUS 250-50 MCG/DOSE AEPB Inhale 2 puffs into the lungs daily.    Cholecalciferol (VITAMIN D3) 5000 units TABS Take 5,000 Units by mouth daily.    donepezil (ARICEPT) 5 MG tablet Take 5  mg by mouth at bedtime.     folic acid (FOLVITE) 1 MG tablet Take 1 mg by mouth daily.    furosemide (LASIX) 20 MG tablet Take 20 mg by mouth daily.     magic mouthwash w/lidocaine SOLN Take 5 mLs by mouth 3 (three) times daily as needed for mouth pain. Qty: 300 mL, Refills: 0   Associated Diagnoses: Primary cancer of larynx  (HCC)    magnesium oxide (MAG-OX) 400 MG tablet Take 800 mg by mouth 3 (three) times daily.     meloxicam (MOBIC) 7.5 MG tablet Take 7.5 mg by mouth daily.     morphine 10 MG/5ML solution Take 2.6 mLs by mouth every 4 (four) hours as needed for pain. Take 2.6 mLs (52 MG) by mouth every 4 (four) hours as needed for pain.    nadolol (CORGARD) 20 MG tablet Take 10 mg by mouth daily.     ondansetron (ZOFRAN) 8 MG tablet Take 1 tablet (8 mg total) by mouth 2 (two) times daily as needed. Qty: 30 tablet, Refills: 1   Associated Diagnoses: Primary cancer of larynx (HCC)    polyethylene glycol (MIRALAX / GLYCOLAX) packet Take 17 g by mouth 2 (two) times daily.    Associated Diagnoses: Primary cancer of larynx (HCC)    prochlorperazine (COMPAZINE) 10 MG tablet Take 1 tablet (10 mg total) by mouth every 6 (six) hours as needed (Nausea or vomiting). Qty: 30 tablet, Refills: 1   Associated Diagnoses: Primary cancer of larynx (HCC)    QUEtiapine (SEROQUEL) 50 MG tablet Take 100 mg by mouth at bedtime.     SPIRIVA HANDIHALER 18 MCG inhalation capsule Place 18 mcg into inhaler and inhale daily.     spironolactone (ALDACTONE) 50 MG tablet Take 50 mg by mouth daily.     sucralfate (CARAFATE) 1 g tablet Take 1 tablet (1 g total) by mouth 3 (three) times daily. Dissolve in 4 tbs warm water, swish and swallow Qty: 90 tablet, Refills: 2    tamsulosin (FLOMAX) 0.4 MG CAPS capsule Take 0.4 mg by mouth 2 (two) times daily.     thiamine 100 MG tablet Take 100 mg by mouth daily.    traZODone (DESYREL) 50 MG tablet Take 50 mg by mouth at bedtime.   Associated Diagnoses: Primary cancer of larynx (HCC)    venlafaxine XR (EFFEXOR-XR) 150 MG 24 hr capsule Take 150 mg by mouth daily with breakfast.     vitamin B-12 (CYANOCOBALAMIN) 1000 MCG tablet Take 1,000 mcg by mouth daily.   Associated Diagnoses: Primary cancer of larynx ([place])      STOP taking these medications     Potassium Chloride ER 20 MEQ TBCR           DISCHARGE INSTRUCTIONS:   Follow-up with primary care physician in a week Home health PT Follow-up with palliative care in a week at group home Follow-up with oncology Dr. Grayland Ormond as scheduled  DIET:  Cardiac diet  DISCHARGE CONDITION:  Stable  ACTIVITY:  Activity as tolerated, HHPT  OXYGEN:  Home Oxygen: No.   Oxygen Delivery: room air  DISCHARGE LOCATION:  home   If you experience worsening of your admission symptoms, develop shortness of breath, life threatening emergency, suicidal or homicidal thoughts you must seek medical attention immediately by calling 911 or calling your MD immediately  if symptoms less severe.  You Must read complete instructions/literature along with all the possible adverse reactions/side effects for all the Medicines you take and that have  been prescribed to you. Take any new Medicines after you have completely understood and accpet all the possible adverse reactions/side effects.   Please note  You were cared for by a hospitalist during your hospital stay. If you have any questions about your discharge medications or the care you received while you were in the hospital after you are discharged, you can call the unit and asked to speak with the hospitalist on call if the hospitalist that took care of you is not available. Once you are discharged, your primary care physician will handle any further medical issues. Please note that NO REFILLS for any discharge medications will be authorized once you are discharged, as it is imperative that you return to your primary care physician (or establish a relationship with a primary care physician if you do not have one) for your aftercare needs so that they can reassess your need for medications and monitor your lab values.     Today  Chief Complaint  Patient presents with  . Weakness   Pt is doing fine. Tolerating diet no new complaints. PT has recommended home health PT  ROS:   CONSTITUTIONAL: Denies fevers, chills. Denies any fatigue, weakness.  EYES: Denies blurry vision, double vision, eye pain. EARS, NOSE, THROAT: Denies tinnitus, ear pain, hearing loss. RESPIRATORY: Denies cough, wheeze, shortness of breath.  CARDIOVASCULAR: Denies chest pain, palpitations, edema.  GASTROINTESTINAL: Denies nausea, vomiting, diarrhea, abdominal pain. Denies bright red blood per rectum. GENITOURINARY: Denies dysuria, hematuria. ENDOCRINE: Denies nocturia or thyroid problems. HEMATOLOGIC AND LYMPHATIC: Denies easy bruising or bleeding. SKIN: Denies rash or lesion. MUSCULOSKELETAL: Denies pain in neck, back, shoulder, knees, hips or arthritic symptoms.  NEUROLOGIC: Denies paralysis, paresthesias.  PSYCHIATRIC: Denies anxiety or depressive symptoms.   VITAL SIGNS:  Blood pressure (!) 139/57, pulse 66, temperature 98.7 F (37.1 C), temperature source Oral, resp. rate 18, height '5\' 7"'$  (1.702 m), weight 52.5 kg (115 lb 11.2 oz), SpO2 100 %.  I/O:    Intake/Output Summary (Last 24 hours) at 05/15/16 1626 Last data filed at 05/15/16 1338  Gross per 24 hour  Intake          3427.92 ml  Output                0 ml  Net          3427.92 ml    PHYSICAL EXAMINATION:  GENERAL:  71 y.o.-year-old patient lying in the bed with no acute distress.  EYES: Pupils equal, round, reactive to light and accommodation. No scleral icterus. Extraocular muscles intact.  HEENT: Head atraumatic, normocephalic. Oropharynx and nasopharynx clear.  NECK:  Supple, no jugular venous distention. No thyroid enlargement, no tenderness.  LUNGS: Normal breath sounds bilaterally, no wheezing, rales,rhonchi or crepitation. No use of accessory muscles of respiration.  CARDIOVASCULAR: S1, S2 normal. No murmurs, rubs, or gallops.  ABDOMEN: Soft, non-tender, non-distended. Bowel sounds present. No organomegaly or mass.  EXTREMITIES: No pedal edema, cyanosis, or clubbing.  NEUROLOGIC: Cranial nerves II through  XII are intact. Muscle strength 5/5 in all extremities. Sensation intact. Gait not checked.  PSYCHIATRIC: The patient is alert and oriented x 3.  SKIN: No obvious rash, lesion, or ulcer.   DATA REVIEW:   CBC  Recent Labs Lab 05/15/16 0338  WBC 6.0  HGB 9.2*  HCT 25.5*  PLT 167    Chemistries   Recent Labs Lab 05/15/16 0338  NA 138  K 4.1  CL 110  CO2 24  GLUCOSE 99  BUN 13  CREATININE 0.87  CALCIUM 8.4*    Cardiac Enzymes No results for input(s): TROPONINI in the last 168 hours.  Microbiology Results  Results for orders placed or performed during the hospital encounter of 05/13/16  MRSA PCR Screening     Status: None   Collection Time: 05/14/16  1:00 AM  Result Value Ref Range Status   MRSA by PCR NEGATIVE NEGATIVE Final    Comment:        The GeneXpert MRSA Assay (FDA approved for NASAL specimens only), is one component of a comprehensive MRSA colonization surveillance program. It is not intended to diagnose MRSA infection nor to guide or monitor treatment for MRSA infections.     RADIOLOGY:  Ct Head Wo Contrast  Result Date: 05/13/2016 CLINICAL DATA:  Throat cancer with multiple falls. EXAM: CT HEAD WITHOUT CONTRAST TECHNIQUE: Contiguous axial images were obtained from the base of the skull through the vertex without intravenous contrast. COMPARISON:  10/21/2015 CT head FINDINGS: Brain: Superficial and central atrophy is again noted with chronic small vessel ischemic disease of periventricular white matter. Encephalomalacia with ex vacuo dilatation of the anterior horn of the right lateral ventricle secondary to remote right temporoparietal infarct remains unchanged. No new large vascular territory infarction, intra-axial mass nor extra-axial fluid collections are noted. Vascular: Atherosclerosis of the vertebral arteries and carotid siphons bilaterally. No hyperdense vessels. Skull: Normal. Negative for fracture or focal lesion. Sinuses/Orbits: No acute  finding. Other: None. 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 No acute intracranial abnormality. Chronic small vessel ischemic disease of periventricular white matter. Electronically Signed   By: Ashley Royalty M.D.   On: 05/13/2016 19:35    EKG:   Orders placed or performed during the hospital encounter of 05/13/16  . ED EKG  . ED EKG      Management plans discussed with the patient, family and they are in agreement.  CODE STATUS:     Code Status Orders        Start     Ordered   05/13/16 1943  Full code  Continuous     05/13/16 1945    Code Status History    Date Active Date Inactive Code Status Order ID Comments User Context   This patient has a current code status but no historical code status.      TOTAL TIME TAKING CARE OF THIS PATIENT: 45 minutes.   Note: This dictation was prepared with Dragon dictation along with smaller phrase technology. Any transcriptional errors that result from this process are unintentional.   '@MEC'$ @  on 05/15/2016 at 4:26 PM  Between 7am to 6pm - Pager - [phone]  After 6pm go to [email or web address] - password [email or web address] [facility]  Office  [phone]  CC: Primary care physician; Vista Mink, FNP

## 2016-05-15 NOTE — Progress Notes (Signed)
Per Dr [name] may leave IV out

## 2016-05-15 NOTE — Discharge Instructions (Signed)
Follow-up with primary care physician in a week Home health PT Follow-up with palliative care in a week at group home Follow-up with oncology Dr. Grayland Ormond as scheduled

## 2016-05-15 NOTE — Care Management (Signed)
Notified [facility] of discharge.  Confirmed they have order for SN and PT

## 2016-05-15 NOTE — Progress Notes (Signed)
I spoke with Mr. [name] on day two of his admission concerning his medication regimen. [facility] assists him with his drugs. He denies any problems obtaining or taking his medications and says the support he receives from the group home is adequate. No questions about particular agents were voiced and no complaints were mentioned. Mr. [name] told me he'd like to leave this afternoon because he is bored. He asked me for a ride home but since I am not leaving soon and live in the opposite direction, he ultimately decided to call his sister.  Please contact pharmacy if there is any support or education we can help provide.  Armanda Forand A. [place], [place].D., BCPS Clinical Pharmacist 05/15/2016 12:03

## 2016-05-15 NOTE — Progress Notes (Signed)
While rounding, [name] made initial visit to room 104. Pt was awake and alert. Pt indicated he was hoping to be discharged soon as he needed to go on a "mission". When asked about this mission, he indicated he was going to [place] in his [name] to a [employer]. CH is uncertain if this is a reality or if the Pt was having some fun. Pt did not indicate a need for spiritual care at this time. CH is available for follow up as needed.    05/15/16 1100  Clinical Encounter Type  Visited With Patient;Health care provider  Visit Type Initial  Consult/Referral To Chaplain

## 2016-05-15 NOTE — Clinical Social Work Note (Signed)
Pt is ready for discharge today and will return to [facility]. Facility is ready to return to facility. Per facility, pt's sister will be providing transportation. CSW updated Careers adviser at [employer]. Facility has received information and is ready for discharge. CSW is signing off as no further needs identified.   [name], MSW, LCSW  Clinical Social Worker  [phone]

## 2016-05-15 NOTE — Care Management Important Message (Signed)
Important Message  Patient Details  Name: [name] MRN: [id number] Date of Birth: 21-Apr-1945   Medicare Important Message Given:  Yes Signed copy given to sister in room   Katrina Stack, RN 05/15/2016, 12:39 PM

## 2016-05-15 NOTE — NC FL2 (Addendum)
[place] LEVEL OF CARE SCREENING TOOL     IDENTIFICATION  Patient Name: Divine [name] Birthdate: [birth year] Sex: male Admission Date (Current Location): 05/13/2016  [place] and [place] Number:  Engineering geologist and Address:  [facility], [address], [place], [place] [address]      Provider Number: [id number]  Attending Physician Name and Address:  Nicholes Mango, MD  Relative Name and Phone Number:       Current Level of Care: Hospital Recommended Level of Care: [place] Prior Approval Number:    Date Approved/Denied:   PASRR Number:    Discharge Plan: Domiciliary (Rest home)    Current Diagnoses: Patient Active Problem List   Diagnosis Date Noted  . Protein-calorie malnutrition, severe 05/14/2016  . Throat cancer ([place])   . Palliative care encounter   . AKI (acute kidney injury) ([place]) 05/13/2016  . Goals of care, counseling/discussion 03/01/2016  . Primary cancer of larynx ([place]) 02/19/2016  . Alcohol abuse 11/18/2015  . Dementia with behavioral disturbance 10/21/2015    Orientation RESPIRATION BLADDER Height & Weight     Self, Place  Normal Continent Weight: 115 lb 11.2 oz (52.5 kg) Height:  '5\' 7"'$  (170.2 cm)  BEHAVIORAL SYMPTOMS/MOOD NEUROLOGICAL BOWEL NUTRITION STATUS      Incontinent, Continent Diet (Heart Healthy, Thin Liquids)  AMBULATORY STATUS COMMUNICATION OF NEEDS Skin   Limited Assist Verbally Normal                       Personal Care Assistance Level of Assistance  Bathing, Feeding, Dressing Bathing Assistance: Limited assistance Feeding assistance: Independent Dressing Assistance: Limited assistance     Functional Limitations Info  Sight, Hearing, Speech Sight Info: Adequate Hearing Info: Adequate Speech Info: Adequate    SPECIAL CARE FACTORS FREQUENCY  PT (By licensed PT)     PT Frequency: HHPT              Contractures Contractures Info: Not present    Additional  Factors Info  Code Status, Allergies, Psychotropic Code Status Info: Full Code Allergies Info: Penicillins Psychotropic Info: Medications:  Seroquel, Effexor        Discharge Medications: Please see discharge summary for a list of discharge medications. Current Discharge Medication List        START taking these medications   Details  alum & mag hydroxide-simeth (MAALOX/MYLANTA) 200-200-20 MG/5ML suspension Take 15 mLs by mouth as needed for indigestion or heartburn. Qty: 355 mL, Refills: 0    Nutritional Supplements (FEEDING SUPPLEMENT, BOOST BREEZE,) LIQD Take 1 Can by mouth 3 (three) times daily between meals. Qty: 90 Can, Refills: 0          CONTINUE these medications which have NOT CHANGED   Details  ADVAIR DISKUS 250-50 MCG/DOSE AEPB Inhale 2 puffs into the lungs daily.    Cholecalciferol (VITAMIN D3) 5000 units TABS Take 5,000 Units by mouth daily.    donepezil (ARICEPT) 5 MG tablet Take 5 mg by mouth at bedtime.     folic acid (FOLVITE) 1 MG tablet Take 1 mg by mouth daily.    furosemide (LASIX) 20 MG tablet Take 20 mg by mouth daily.     magic mouthwash w/lidocaine SOLN Take 5 mLs by mouth 3 (three) times daily as needed for mouth pain. Qty: 300 mL, Refills: 0   Associated Diagnoses: Primary cancer of larynx (HCC)    magnesium oxide (MAG-OX) 400 MG tablet Take 800 mg by mouth  3 (three) times daily.     meloxicam (MOBIC) 7.5 MG tablet Take 7.5 mg by mouth daily.     morphine 10 MG/5ML solution Take 2.6 mLs by mouth every 4 (four) hours as needed for pain. Take 2.6 mLs (52 MG) by mouth every 4 (four) hours as needed for pain.    nadolol (CORGARD) 20 MG tablet Take 10 mg by mouth daily.     ondansetron (ZOFRAN) 8 MG tablet Take 1 tablet (8 mg total) by mouth 2 (two) times daily as needed. Qty: 30 tablet, Refills: 1   Associated Diagnoses: Primary cancer of larynx (HCC)    polyethylene glycol (MIRALAX / GLYCOLAX) packet Take 17 g by mouth 2  (two) times daily.    Associated Diagnoses: Primary cancer of larynx (HCC)    prochlorperazine (COMPAZINE) 10 MG tablet Take 1 tablet (10 mg total) by mouth every 6 (six) hours as needed (Nausea or vomiting). Qty: 30 tablet, Refills: 1   Associated Diagnoses: Primary cancer of larynx (HCC)    QUEtiapine (SEROQUEL) 50 MG tablet Take 100 mg by mouth at bedtime.     SPIRIVA HANDIHALER 18 MCG inhalation capsule Place 18 mcg into inhaler and inhale daily.     spironolactone (ALDACTONE) 50 MG tablet Take 50 mg by mouth daily.     sucralfate (CARAFATE) 1 g tablet Take 1 tablet (1 g total) by mouth 3 (three) times daily. Dissolve in 4 tbs warm water, swish and swallow Qty: 90 tablet, Refills: 2    tamsulosin (FLOMAX) 0.4 MG CAPS capsule Take 0.4 mg by mouth 2 (two) times daily.     thiamine 100 MG tablet Take 100 mg by mouth daily.    traZODone (DESYREL) 50 MG tablet Take 50 mg by mouth at bedtime.   Associated Diagnoses: Primary cancer of larynx (HCC)    venlafaxine XR (EFFEXOR-XR) 150 MG 24 hr capsule Take 150 mg by mouth daily with breakfast.     vitamin B-12 (CYANOCOBALAMIN) 1000 MCG tablet Take 1,000 mcg by mouth daily.   Associated Diagnoses: Primary cancer of larynx ([place])         STOP taking these medications     Potassium Chloride ER 20 MEQ TBCR    Relevant Imaging Results:  Relevant Lab Results:   Additional Information SSN:  [id number]   Palliative Care NP to follow (through [facility] and [place])  HHPT to follow (through [facility])   [name], LCSW

## 2016-05-15 NOTE — Progress Notes (Signed)
Received MD order to discharge patient. Reviewed prescriptions, home meds and follow up appointment with patient and sister and both verbalized understanding

## 2016-05-15 NOTE — Clinical Social Work Note (Signed)
Clinical Social Work Assessment  Patient Details  Name: [name] MRN: [id number] Date of Birth: 10-Oct-1945  Date of referral:  05/14/16               Reason for consult:  Facility Placement, Discharge Planning                Permission sought to share information with:  Family Supports Permission granted to share information::  Yes, Verbal Permission Granted  Name::     Jearld Lesch  Agency::  [facility]  Relationship::  [place] Social Worker  Contact Information:  [phone]  Housing/Transportation Living arrangements for the past 2 months:  [place] of Information:  Guardian Patient Interpreter Needed:  None Criminal Activity/Legal Involvement Pertinent to Current Situation/Hospitalization:  No - Comment as needed Significant Relationships:  Siblings Lives with:  Facility Resident Do you feel safe going back to the place where you live?  Yes Need for family participation in patient care:  Yes (Comment)  Care giving concerns:  No care giving concerns identified.   Social Worker assessment / plan:  CSW spoke with pt's guardian to address consult. CSW introduced herself and explained role of social work. CSW explained the process of returning to group home. Pt's guardian is aware of discharge recommendation for HHPT with Palliative Care following. Pt's guardian is agreeable to pt returning to facility, which likely be soon. CSW will update guardian at discharge. CSW will continue to follow.   Employment status:  Retired Forensic scientist:  Information systems manager, Medicaid In [place] PT Recommendations:  Home with [place] / Referral to community resources:  Other (Comment Required) (Home Health and Palliative Care)  Patient/Family's Response to care:  Pt's guardian was appreciative of CSW support.   Patient/Family's Understanding of and Emotional Response to Diagnosis, Current Treatment, and Prognosis:  Pt's guardian understands that pt is in need to  return to facility.   Emotional Assessment Appearance:  Other (Comment Required Attitude/Demeanor/Rapport:  Other Affect (typically observed):  Other Orientation:  Oriented to Self, Oriented to Place Alcohol / Substance use:  Not Applicable Psych involvement (Current and /or in the community):  No (Comment)  Discharge Needs  Concerns to be addressed:  Adjustment to Illness Readmission within the last 30 days:  No Current discharge risk:  Chronically ill Barriers to Discharge:  Continued Medical Work up   [employer], LCSW 05/15/2016, 5:10 PM

## 2016-05-18 ENCOUNTER — Ambulatory Visit
Admission: RE | Admit: 2016-05-18 | Discharge: 2016-05-18 | Disposition: A | Discharge status: Home / Self Care | Payer: Medicare Other | Source: Ambulatory Visit | Attending: Radiation Oncology | Admitting: Radiation Oncology

## 2016-05-18 DIAGNOSIS — F17210 Nicotine dependence, cigarettes, uncomplicated: Secondary | ICD-10-CM | POA: Diagnosis not present

## 2016-05-18 DIAGNOSIS — J449 Chronic obstructive pulmonary disease, unspecified: Secondary | ICD-10-CM | POA: Diagnosis not present

## 2016-05-18 DIAGNOSIS — K7460 Unspecified cirrhosis of liver: Secondary | ICD-10-CM | POA: Diagnosis not present

## 2016-05-18 DIAGNOSIS — C321 Malignant neoplasm of supraglottis: Secondary | ICD-10-CM | POA: Diagnosis not present

## 2016-05-18 DIAGNOSIS — Z79899 Other long term (current) drug therapy: Secondary | ICD-10-CM | POA: Diagnosis not present

## 2016-05-18 DIAGNOSIS — Z510 Encounter for antineoplastic radiation therapy: Secondary | ICD-10-CM | POA: Diagnosis present

## 2016-05-18 DIAGNOSIS — F1020 Alcohol dependence, uncomplicated: Secondary | ICD-10-CM | POA: Diagnosis not present

## 2016-05-19 ENCOUNTER — Ambulatory Visit
Admission: RE | Admit: 2016-05-19 | Discharge: 2016-05-19 | Disposition: A | Discharge status: Home / Self Care | Payer: Medicare Other | Source: Ambulatory Visit | Attending: Radiation Oncology | Admitting: Radiation Oncology

## 2016-05-19 ENCOUNTER — Ambulatory Visit: Payer: Medicare Other

## 2016-05-19 DIAGNOSIS — Z510 Encounter for antineoplastic radiation therapy: Secondary | ICD-10-CM | POA: Diagnosis not present

## 2016-05-20 ENCOUNTER — Ambulatory Visit
Admission: RE | Admit: 2016-05-20 | Discharge: 2016-05-20 | Disposition: A | Discharge status: Home / Self Care | Payer: Medicare Other | Source: Ambulatory Visit | Attending: Radiation Oncology | Admitting: Radiation Oncology

## 2016-05-20 DIAGNOSIS — Z510 Encounter for antineoplastic radiation therapy: Secondary | ICD-10-CM | POA: Diagnosis not present

## 2016-05-21 ENCOUNTER — Ambulatory Visit
Admission: RE | Admit: 2016-05-21 | Discharge: 2016-05-21 | Disposition: A | Discharge status: Home / Self Care | Payer: Medicare Other | Source: Ambulatory Visit | Attending: Radiation Oncology | Admitting: Radiation Oncology

## 2016-05-21 DIAGNOSIS — F17210 Nicotine dependence, cigarettes, uncomplicated: Secondary | ICD-10-CM | POA: Diagnosis not present

## 2016-05-21 DIAGNOSIS — J449 Chronic obstructive pulmonary disease, unspecified: Secondary | ICD-10-CM | POA: Diagnosis not present

## 2016-05-21 DIAGNOSIS — Z79899 Other long term (current) drug therapy: Secondary | ICD-10-CM | POA: Diagnosis not present

## 2016-05-21 DIAGNOSIS — F1020 Alcohol dependence, uncomplicated: Secondary | ICD-10-CM | POA: Diagnosis not present

## 2016-05-21 DIAGNOSIS — C321 Malignant neoplasm of supraglottis: Secondary | ICD-10-CM | POA: Diagnosis not present

## 2016-05-21 DIAGNOSIS — K7460 Unspecified cirrhosis of liver: Secondary | ICD-10-CM | POA: Diagnosis not present

## 2016-05-21 DIAGNOSIS — Z510 Encounter for antineoplastic radiation therapy: Secondary | ICD-10-CM | POA: Diagnosis not present

## 2016-05-24 ENCOUNTER — Emergency Department: Payer: Medicare Other

## 2016-05-24 ENCOUNTER — Other Ambulatory Visit: Payer: Self-pay

## 2016-05-24 ENCOUNTER — Inpatient Hospital Stay
Admission: EM | Admit: 2016-05-24 | Discharge: 2016-05-26 | DRG: 312 | Disposition: A | Discharge status: Hospice | Payer: Medicare Other | Attending: Internal Medicine | Admitting: Internal Medicine

## 2016-05-24 DIAGNOSIS — C140 Malignant neoplasm of pharynx, unspecified: Secondary | ICD-10-CM | POA: Diagnosis present

## 2016-05-24 DIAGNOSIS — J449 Chronic obstructive pulmonary disease, unspecified: Secondary | ICD-10-CM | POA: Diagnosis present

## 2016-05-24 DIAGNOSIS — E860 Dehydration: Secondary | ICD-10-CM | POA: Diagnosis present

## 2016-05-24 DIAGNOSIS — I951 Orthostatic hypotension: Secondary | ICD-10-CM | POA: Diagnosis not present

## 2016-05-24 DIAGNOSIS — Z9221 Personal history of antineoplastic chemotherapy: Secondary | ICD-10-CM

## 2016-05-24 DIAGNOSIS — Z923 Personal history of irradiation: Secondary | ICD-10-CM

## 2016-05-24 DIAGNOSIS — E871 Hypo-osmolality and hyponatremia: Secondary | ICD-10-CM | POA: Diagnosis present

## 2016-05-24 DIAGNOSIS — F03918 Unspecified dementia, unspecified severity, with other behavioral disturbance: Secondary | ICD-10-CM | POA: Diagnosis present

## 2016-05-24 DIAGNOSIS — E43 Unspecified severe protein-calorie malnutrition: Secondary | ICD-10-CM | POA: Diagnosis present

## 2016-05-24 DIAGNOSIS — C329 Malignant neoplasm of larynx, unspecified: Secondary | ICD-10-CM

## 2016-05-24 DIAGNOSIS — R296 Repeated falls: Secondary | ICD-10-CM | POA: Diagnosis present

## 2016-05-24 DIAGNOSIS — Z79899 Other long term (current) drug therapy: Secondary | ICD-10-CM

## 2016-05-24 DIAGNOSIS — Z7951 Long term (current) use of inhaled steroids: Secondary | ICD-10-CM

## 2016-05-24 DIAGNOSIS — B1920 Unspecified viral hepatitis C without hepatic coma: Secondary | ICD-10-CM | POA: Diagnosis present

## 2016-05-24 DIAGNOSIS — Z681 Body mass index (BMI) 19 or less, adult: Secondary | ICD-10-CM

## 2016-05-24 DIAGNOSIS — R55 Syncope and collapse: Secondary | ICD-10-CM

## 2016-05-24 DIAGNOSIS — Z7189 Other specified counseling: Secondary | ICD-10-CM

## 2016-05-24 DIAGNOSIS — Z66 Do not resuscitate: Secondary | ICD-10-CM | POA: Diagnosis present

## 2016-05-24 DIAGNOSIS — F17210 Nicotine dependence, cigarettes, uncomplicated: Secondary | ICD-10-CM | POA: Diagnosis present

## 2016-05-24 DIAGNOSIS — N289 Disorder of kidney and ureter, unspecified: Secondary | ICD-10-CM

## 2016-05-24 DIAGNOSIS — Z791 Long term (current) use of non-steroidal anti-inflammatories (NSAID): Secondary | ICD-10-CM

## 2016-05-24 DIAGNOSIS — K7030 Alcoholic cirrhosis of liver without ascites: Secondary | ICD-10-CM | POA: Diagnosis present

## 2016-05-24 DIAGNOSIS — F0391 Unspecified dementia with behavioral disturbance: Secondary | ICD-10-CM | POA: Diagnosis present

## 2016-05-24 DIAGNOSIS — N400 Enlarged prostate without lower urinary tract symptoms: Secondary | ICD-10-CM | POA: Diagnosis present

## 2016-05-24 DIAGNOSIS — N179 Acute kidney failure, unspecified: Secondary | ICD-10-CM | POA: Diagnosis present

## 2016-05-24 DIAGNOSIS — R531 Weakness: Secondary | ICD-10-CM

## 2016-05-24 LAB — BASIC METABOLIC PANEL
Anion gap: 5 (ref 5–15)
BUN: 25 mg/dL — ABNORMAL HIGH (ref 6–20)
CHLORIDE: 92 mmol/L — AB (ref 101–111)
CO2: 31 mmol/L (ref 22–32)
Calcium: 9.2 mg/dL (ref 8.9–10.3)
Creatinine, Ser: 1.58 mg/dL — ABNORMAL HIGH (ref 0.61–1.24)
GFR calc non Af Amer: 42 mL/min — ABNORMAL LOW (ref >60)
GFR, EST AFRICAN AMERICAN: 48 mL/min — AB (ref >60)
Glucose, Bld: 109 mg/dL — ABNORMAL HIGH (ref 65–99)
POTASSIUM: 4.5 mmol/L (ref 3.5–5.1)
SODIUM: 128 mmol/L — AB (ref 135–145)

## 2016-05-24 LAB — CBC
HEMATOCRIT: 30.2 % — AB (ref 40.0–52.0)
Hemoglobin: 10.7 g/dL — ABNORMAL LOW (ref 13.0–18.0)
MCH: 34.3 pg — AB (ref 26.0–34.0)
MCHC: 35.3 g/dL (ref 32.0–36.0)
MCV: 97.3 fL (ref 80.0–100.0)
Platelets: 257 10*3/uL (ref 150–440)
RBC: 3.11 MIL/uL — AB (ref 4.40–5.90)
RDW: 17.9 % — ABNORMAL HIGH (ref 11.5–14.5)
WBC: 5.5 10*3/uL (ref 3.8–10.6)

## 2016-05-24 LAB — HEPATIC FUNCTION PANEL
ALK PHOS: 69 U/L (ref 38–126)
ALT: 32 U/L (ref 17–63)
AST: 51 U/L — ABNORMAL HIGH (ref 15–41)
Albumin: 3.2 g/dL — ABNORMAL LOW (ref 3.5–5.0)
BILIRUBIN INDIRECT: 0.5 mg/dL (ref 0.3–0.9)
BILIRUBIN TOTAL: 0.8 mg/dL (ref 0.3–1.2)
Bilirubin, Direct: 0.3 mg/dL (ref 0.1–0.5)
Total Protein: 6.7 g/dL (ref 6.5–8.1)

## 2016-05-24 LAB — ETHANOL: Alcohol, Ethyl (B): <5 mg/dL (ref <5)

## 2016-05-24 LAB — LIPASE, BLOOD: LIPASE: 41 U/L (ref 11–51)

## 2016-05-24 MED ORDER — SODIUM CHLORIDE 0.9 % IV BOLUS (SEPSIS)
1000.0000 mL | Freq: Once | INTRAVENOUS | Status: AC
  Administered 2016-05-24: 1000 mL via INTRAVENOUS

## 2016-05-24 NOTE — ED Notes (Signed)
EDP notified of orthostatics, VO given for 1L bolus of NS.

## 2016-05-24 NOTE — ED Triage Notes (Signed)
Pt brought in from [facility] via EMS.  Pt getting up to use restroom and became weak and fell.  Pt states that he hit his head.  Pt A&Ox4, but states that he is tired.  Pt received HS trazodone and seroquel before fall.  Pt denies pain.

## 2016-05-24 NOTE — ED Provider Notes (Signed)
[facility] Emergency Department Provider Note   ____________________________________________   First MD Initiated Contact with Patient 05/24/16 2313     (approximate)  I have reviewed the triage vital signs and the nursing notes.   HISTORY  Chief Complaint Near Syncope  History limited by dementia  HPI [name] is a 71 y.o. male brought to the EDfrom care home via EMS with a chief complaint of generalized weakness, near syncope and fall. Patient with a history of alcohol abuse, cirrhosis, laryngeal cancer undergoing chemotherapy and radiation. Patient received his nighttime medications of trazodone and Seroquel before the fall. Reports he struck his head but denies LOC. Complains of feeling dizzy and lightheaded. Denies recent fever, chills, chest pain, shortness of breath, abdominal pain, nausea, vomiting, diarrhea. Denies recent travel. Nothing makes his symptoms better; standing makes his symptoms worse.   Past Medical History:  Diagnosis Date  . Alcohol abuse   . Cirrhosis ([place])   . COPD (chronic obstructive pulmonary disease) ([place])   . Hepatitis C   . Malnutrition ([place])   . Throat cancer [facility])     Patient Active Problem List   Diagnosis Date Noted  . Protein-calorie malnutrition, severe 05/14/2016  . Throat cancer ([place])   . Palliative care encounter   . AKI (acute kidney injury) ([place]) 05/13/2016  . Goals of care, counseling/discussion 03/01/2016  . Primary cancer of larynx ([place]) 02/19/2016  . Alcohol abuse 11/18/2015  . Dementia with behavioral disturbance 10/21/2015    Past Surgical History:  Procedure Laterality Date  . TONSILLECTOMY     age was 45  . VASECTOMY      Prior to Admission medications   Medication Sig Start Date End Date Taking? Authorizing Provider  ADVAIR DISKUS 250-50 MCG/DOSE AEPB Inhale 2 puffs into the lungs daily. 08/29/15   Historical Provider, MD  alum & mag hydroxide-simeth (MAALOX/MYLANTA) 200-200-20  MG/5ML suspension Take 15 mLs by mouth as needed for indigestion or heartburn. 05/15/16   Nicholes Mango, MD  Cholecalciferol (VITAMIN D3) 5000 units TABS Take 5,000 Units by mouth daily.    Historical Provider, MD  donepezil (ARICEPT) 5 MG tablet Take 5 mg by mouth at bedtime.  03/02/16   Historical Provider, MD  folic acid (FOLVITE) 1 MG tablet Take 1 mg by mouth daily.    Historical Provider, MD  furosemide (LASIX) 20 MG tablet Take 20 mg by mouth daily.  10/14/15   Historical Provider, MD  magic mouthwash w/lidocaine SOLN Take 5 mLs by mouth 3 (three) times daily as needed for mouth pain. 04/16/16   Lloyd Huger, MD  magnesium oxide (MAG-OX) 400 MG tablet Take 800 mg by mouth 3 (three) times daily.     Historical Provider, MD  meloxicam (MOBIC) 7.5 MG tablet Take 7.5 mg by mouth daily.  10/14/15   Historical Provider, MD  morphine 10 MG/5ML solution Take 2.6 mLs by mouth every 4 (four) hours as needed for pain. Take 2.6 mLs (52 MG) by mouth every 4 (four) hours as needed for pain. 05/04/16   Historical Provider, MD  nadolol (CORGARD) 20 MG tablet Take 10 mg by mouth daily.  09/16/15   Historical Provider, MD  Nutritional Supplements (FEEDING SUPPLEMENT, BOOST BREEZE,) LIQD Take 1 Can by mouth 3 (three) times daily between meals. 05/15/16   Nicholes Mango, MD  ondansetron (ZOFRAN) 8 MG tablet Take 1 tablet (8 mg total) by mouth 2 (two) times daily as needed. 03/18/16   Lloyd Huger, MD  polyethylene glycol (MIRALAX / GLYCOLAX) packet Take 17 g by mouth 2 (two) times daily.     Historical Provider, MD  prochlorperazine (COMPAZINE) 10 MG tablet Take 1 tablet (10 mg total) by mouth every 6 (six) hours as needed (Nausea or vomiting). 03/18/16   Lloyd Huger, MD  QUEtiapine (SEROQUEL) 50 MG tablet Take 100 mg by mouth at bedtime.  02/25/16   Historical Provider, MD  SPIRIVA HANDIHALER 18 MCG inhalation capsule Place 18 mcg into inhaler and inhale daily.  10/07/15   Historical Provider, MD  spironolactone  (ALDACTONE) 50 MG tablet Take 50 mg by mouth daily.  10/14/15   Historical Provider, MD  sucralfate (CARAFATE) 1 g tablet Take 1 tablet (1 g total) by mouth 3 (three) times daily. Dissolve in 4 tbs warm water, swish and swallow 04/13/16   Noreene Filbert, MD  tamsulosin (FLOMAX) 0.4 MG CAPS capsule Take 0.4 mg by mouth 2 (two) times daily.  10/14/15   Historical Provider, MD  thiamine 100 MG tablet Take 100 mg by mouth daily.    Historical Provider, MD  traZODone (DESYREL) 50 MG tablet Take 50 mg by mouth at bedtime.    Historical Provider, MD  venlafaxine XR (EFFEXOR-XR) 150 MG 24 hr capsule Take 150 mg by mouth daily with breakfast.  03/09/16   Historical Provider, MD  vitamin B-12 (CYANOCOBALAMIN) 1000 MCG tablet Take 1,000 mcg by mouth daily.    Historical Provider, MD    Allergies Penicillins  Family History  Problem Relation Age of Onset  . Breast cancer Mother     Social History Social History  Substance Use Topics  . Smoking status: Current Every Day Smoker    Packs/day: 0.50    Years: 63.00    Types: Cigarettes  . Smokeless tobacco: Never Used  . Alcohol use 0.6 oz/week    1 Cans of beer per week    Review of Systems  Constitutional: No fever/chills. Eyes: No visual changes. ENT: No sore throat. Cardiovascular: Denies chest pain. Respiratory: Denies shortness of breath. Gastrointestinal: No abdominal pain.  No nausea, no vomiting.  No diarrhea.  No constipation. Genitourinary: Negative for dysuria. Musculoskeletal: Negative for back pain. Skin: Negative for rash. Neurological: Positive for dizzy and lightheaded. Negative for headaches, focal weakness or numbness.  Limited by dementia; 10-point ROS otherwise negative.  ____________________________________________   PHYSICAL EXAM:  VITAL SIGNS: ED Triage Vitals  Enc Vitals Group     BP 05/24/16 2151 115/71     Pulse Rate 05/24/16 2151 92     Resp 05/24/16 2151 18     Temp 05/24/16 2151 97.5 F (36.4 C)      Temp Source 05/24/16 2151 Oral     SpO2 05/24/16 2151 92 %     Weight 05/24/16 2152 115 lb (52.2 kg)     Height 05/24/16 2152 '5\' 7"'$  (1.702 m)     Head Circumference --      Peak Flow --      Pain Score --      Pain Loc --      Pain Edu? --      Excl. in [place]? --     Constitutional: Asleep, awakened for exam. Alert and oriented. Chronically ill appearing and in no acute distress. Eyes: Conjunctivae are normal. PERRL. EOMI. Head: Atraumatic. Nose: No congestion/rhinnorhea. Mouth/Throat: Mucous membranes are moist.  Oropharynx non-erythematous. Neck: No stridor.  No cervical spine tenderness to palpation. Cardiovascular: Normal rate, regular rhythm. Grossly normal heart sounds.  Good peripheral circulation. Respiratory: Normal respiratory effort.  No retractions. Lungs CTAB. Gastrointestinal: Soft and nontender. Hepatomegaly. No distention. No abdominal bruits. No CVA tenderness. Musculoskeletal: No lower extremity tenderness nor edema.  No joint effusions. Neurologic:  Normal speech and language. No gross focal neurologic deficits are appreciated.  Skin:  Skin is warm, dry and intact. No rash noted. Psychiatric: Mood and affect are normal. Speech and behavior are normal.  ____________________________________________   LABS (all labs ordered are listed, but only abnormal results are displayed)  Labs Reviewed  BASIC METABOLIC PANEL - Abnormal; Notable for the following:       Result Value   Sodium 128 (*)    Chloride 92 (*)    Glucose, Bld 109 (*)    BUN 25 (*)    Creatinine, Ser 1.58 (*)    GFR calc non Af Amer 42 (*)    GFR calc Af Amer 48 (*)    All other components within normal limits  CBC - Abnormal; Notable for the following:    RBC 3.11 (*)    Hemoglobin 10.7 (*)    HCT 30.2 (*)    MCH 34.3 (*)    RDW 17.9 (*)    All other components within normal limits  URINALYSIS, COMPLETE (UACMP) WITH MICROSCOPIC  HEPATIC FUNCTION PANEL  LIPASE, BLOOD  ETHANOL  CBG  MONITORING, ED   ____________________________________________  EKG  ED ECG REPORT I, SUNG,JADE J, the attending physician, personally viewed and interpreted this ECG.   Date: 05/24/2016  EKG Time: 2136  Rate: 55  Rhythm: sinus bradycardia  Axis: Normal  Intervals:none  ST&T Change: Nonspecific  ____________________________________________  RADIOLOGY  CT head interpreted per Dr. Toney Reil: 1. No acute intracranial abnormality identified.  2. Stable chronic right anterior perisylvian infarction.  3. Stable mild chronic microvascular ischemic changes and mild  parenchymal volume loss of the brain.   ____________________________________________   PROCEDURES  Procedure(s) performed: None  Procedures  Critical Care performed: No  ____________________________________________   INITIAL IMPRESSION / ASSESSMENT AND PLAN / ED COURSE  Pertinent labs & imaging results that were available during my care of the patient were reviewed by me and considered in my medical decision making (see chart for details).  71 year old male with cirrhosis, laryngeal cancer who presents with near syncopal episode and fall. Laboratory results remarkable for new hyponatremia and renal insufficiency from 9 days ago. CT head is negative for intracranial hemorrhage. Will obtain orthostatic vital signs, initiate IV fluid resuscitation and reassess.  Clinical Course as of May 24 2337  Nancy Fetter May 24, 2016  2338 Orthostatic vital signs reveals a 40 point drop in blood pressure on standing to 64/44. Will discuss with hospitalist who value patient in the emergency department for admission.  [JS]    Clinical Course User Index [JS] Paulette Blanch, MD     ____________________________________________   FINAL CLINICAL IMPRESSION(S) / ED DIAGNOSES  Final diagnoses:  Near syncope  Weakness  Hyponatremia  Orthostasis  Renal insufficiency  Dehydration      NEW MEDICATIONS STARTED DURING THIS  VISIT:  New Prescriptions   No medications on file     Note:  This document was prepared using Dragon voice recognition software and may include unintentional dictation errors.    Paulette Blanch, MD 05/25/16 [phone]

## 2016-05-25 ENCOUNTER — Encounter: Payer: Self-pay | Admitting: Student

## 2016-05-25 DIAGNOSIS — K7030 Alcoholic cirrhosis of liver without ascites: Secondary | ICD-10-CM | POA: Diagnosis present

## 2016-05-25 DIAGNOSIS — Z9221 Personal history of antineoplastic chemotherapy: Secondary | ICD-10-CM

## 2016-05-25 DIAGNOSIS — E871 Hypo-osmolality and hyponatremia: Secondary | ICD-10-CM | POA: Diagnosis present

## 2016-05-25 DIAGNOSIS — F17210 Nicotine dependence, cigarettes, uncomplicated: Secondary | ICD-10-CM | POA: Diagnosis present

## 2016-05-25 DIAGNOSIS — Z681 Body mass index (BMI) 19 or less, adult: Secondary | ICD-10-CM | POA: Diagnosis not present

## 2016-05-25 DIAGNOSIS — R296 Repeated falls: Secondary | ICD-10-CM | POA: Diagnosis present

## 2016-05-25 DIAGNOSIS — R5381 Other malaise: Secondary | ICD-10-CM

## 2016-05-25 DIAGNOSIS — F0391 Unspecified dementia with behavioral disturbance: Secondary | ICD-10-CM | POA: Diagnosis not present

## 2016-05-25 DIAGNOSIS — I959 Hypotension, unspecified: Secondary | ICD-10-CM | POA: Diagnosis not present

## 2016-05-25 DIAGNOSIS — R531 Weakness: Secondary | ICD-10-CM

## 2016-05-25 DIAGNOSIS — Z79899 Other long term (current) drug therapy: Secondary | ICD-10-CM | POA: Diagnosis not present

## 2016-05-25 DIAGNOSIS — R5383 Other fatigue: Secondary | ICD-10-CM

## 2016-05-25 DIAGNOSIS — I951 Orthostatic hypotension: Secondary | ICD-10-CM | POA: Diagnosis present

## 2016-05-25 DIAGNOSIS — F0281 Dementia in other diseases classified elsewhere with behavioral disturbance: Secondary | ICD-10-CM

## 2016-05-25 DIAGNOSIS — J449 Chronic obstructive pulmonary disease, unspecified: Secondary | ICD-10-CM

## 2016-05-25 DIAGNOSIS — Z7951 Long term (current) use of inhaled steroids: Secondary | ICD-10-CM | POA: Diagnosis not present

## 2016-05-25 DIAGNOSIS — B1920 Unspecified viral hepatitis C without hepatic coma: Secondary | ICD-10-CM | POA: Diagnosis present

## 2016-05-25 DIAGNOSIS — C321 Malignant neoplasm of supraglottis: Secondary | ICD-10-CM

## 2016-05-25 DIAGNOSIS — E860 Dehydration: Secondary | ICD-10-CM

## 2016-05-25 DIAGNOSIS — N400 Enlarged prostate without lower urinary tract symptoms: Secondary | ICD-10-CM | POA: Diagnosis present

## 2016-05-25 DIAGNOSIS — Z923 Personal history of irradiation: Secondary | ICD-10-CM | POA: Diagnosis not present

## 2016-05-25 DIAGNOSIS — Z66 Do not resuscitate: Secondary | ICD-10-CM | POA: Diagnosis present

## 2016-05-25 DIAGNOSIS — Z791 Long term (current) use of non-steroidal anti-inflammatories (NSAID): Secondary | ICD-10-CM | POA: Diagnosis not present

## 2016-05-25 DIAGNOSIS — R55 Syncope and collapse: Secondary | ICD-10-CM | POA: Diagnosis present

## 2016-05-25 DIAGNOSIS — N289 Disorder of kidney and ureter, unspecified: Secondary | ICD-10-CM | POA: Diagnosis not present

## 2016-05-25 DIAGNOSIS — E43 Unspecified severe protein-calorie malnutrition: Secondary | ICD-10-CM | POA: Diagnosis present

## 2016-05-25 DIAGNOSIS — N179 Acute kidney failure, unspecified: Secondary | ICD-10-CM | POA: Diagnosis present

## 2016-05-25 DIAGNOSIS — R630 Anorexia: Secondary | ICD-10-CM | POA: Diagnosis not present

## 2016-05-25 DIAGNOSIS — C140 Malignant neoplasm of pharynx, unspecified: Secondary | ICD-10-CM | POA: Diagnosis present

## 2016-05-25 DIAGNOSIS — Z7189 Other specified counseling: Secondary | ICD-10-CM | POA: Diagnosis not present

## 2016-05-25 DIAGNOSIS — Z515 Encounter for palliative care: Secondary | ICD-10-CM | POA: Diagnosis not present

## 2016-05-25 LAB — URINALYSIS, COMPLETE (UACMP) WITH MICROSCOPIC
Bacteria, UA: NONE SEEN
Bilirubin Urine: NEGATIVE
Glucose, UA: NEGATIVE mg/dL
Hgb urine dipstick: NEGATIVE
Ketones, ur: NEGATIVE mg/dL
Leukocytes, UA: NEGATIVE
Nitrite: NEGATIVE
Protein, ur: NEGATIVE mg/dL
Specific Gravity, Urine: 1.006 (ref 1.005–1.030)
pH: 7 (ref 5.0–8.0)

## 2016-05-25 LAB — TSH: TSH: 4.175 u[IU]/mL (ref 0.350–4.500)

## 2016-05-25 MED ORDER — ONDANSETRON HCL 4 MG/2ML IJ SOLN: 4.0000 mg | Freq: Four times a day (QID) | INTRAMUSCULAR | Status: DC | PRN

## 2016-05-25 MED ORDER — DOCUSATE SODIUM 100 MG PO CAPS
100.0000 mg | ORAL_CAPSULE | Freq: Two times a day (BID) | ORAL | Status: DC
  Administered 2016-05-25 – 2016-05-26 (×3): 100 mg via ORAL
  Filled 2016-05-25 (×3): qty 1

## 2016-05-25 MED ORDER — HYDROMORPHONE HCL 1 MG/ML IJ SOLN: 1.0000 mg | INTRAMUSCULAR | Status: DC | PRN

## 2016-05-25 MED ORDER — TIOTROPIUM BROMIDE MONOHYDRATE 18 MCG IN CAPS
18.0000 ug | ORAL_CAPSULE | Freq: Every day | RESPIRATORY_TRACT | Status: DC
  Administered 2016-05-25 – 2016-05-26 (×2): 18 ug via RESPIRATORY_TRACT
  Filled 2016-05-25: qty 5

## 2016-05-25 MED ORDER — VITAMIN D 1000 UNITS PO TABS
5000.0000 [IU] | ORAL_TABLET | Freq: Every day | ORAL | Status: DC
  Administered 2016-05-25 – 2016-05-26 (×2): 5000 [IU] via ORAL
  Filled 2016-05-25 (×2): qty 5

## 2016-05-25 MED ORDER — MOMETASONE FURO-FORMOTEROL FUM 200-5 MCG/ACT IN AERO
2.0000 | INHALATION_SPRAY | Freq: Two times a day (BID) | RESPIRATORY_TRACT | Status: DC
  Administered 2016-05-25 – 2016-05-26 (×3): 2 via RESPIRATORY_TRACT
  Filled 2016-05-25: qty 8.8

## 2016-05-25 MED ORDER — ENOXAPARIN SODIUM 40 MG/0.4ML ~~LOC~~ SOLN
40.0000 mg | SUBCUTANEOUS | Status: DC
  Administered 2016-05-25: 40 mg via SUBCUTANEOUS
  Filled 2016-05-25: qty 0.4

## 2016-05-25 MED ORDER — SPIRONOLACTONE 25 MG PO TABS
50.0000 mg | ORAL_TABLET | Freq: Every day | ORAL | Status: DC
  Administered 2016-05-25 – 2016-05-26 (×2): 50 mg via ORAL
  Filled 2016-05-25 (×2): qty 2

## 2016-05-25 MED ORDER — MORPHINE SULFATE 10 MG/5ML PO SOLN: 5.0000 mg | ORAL | Status: DC | PRN

## 2016-05-25 MED ORDER — TRAZODONE HCL 50 MG PO TABS: 50.0000 mg | ORAL_TABLET | Freq: Every day | ORAL | Status: DC

## 2016-05-25 MED ORDER — LIDOCAINE VISCOUS 2 % MT SOLN
5.0000 mL | Freq: Three times a day (TID) | OROMUCOSAL | Status: DC | PRN
  Administered 2016-05-25: 5 mL via OROMUCOSAL
  Filled 2016-05-25: qty 15
  Filled 2016-05-25: qty 5

## 2016-05-25 MED ORDER — FOLIC ACID 1 MG PO TABS
1.0000 mg | ORAL_TABLET | Freq: Every day | ORAL | Status: DC
  Administered 2016-05-25 – 2016-05-26 (×2): 1 mg via ORAL
  Filled 2016-05-25 (×2): qty 1

## 2016-05-25 MED ORDER — FUROSEMIDE 20 MG PO TABS
20.0000 mg | ORAL_TABLET | Freq: Every day | ORAL | Status: DC
  Administered 2016-05-25 – 2016-05-26 (×2): 20 mg via ORAL
  Filled 2016-05-25 (×2): qty 1

## 2016-05-25 MED ORDER — SUCRALFATE 1 G PO TABS
1.0000 g | ORAL_TABLET | Freq: Three times a day (TID) | ORAL | Status: DC
  Administered 2016-05-25 – 2016-05-26 (×5): 1 g via ORAL
  Filled 2016-05-25 (×5): qty 1

## 2016-05-25 MED ORDER — VITAMIN B-12 1000 MCG PO TABS
1000.0000 ug | ORAL_TABLET | Freq: Every day | ORAL | Status: DC
  Administered 2016-05-25 – 2016-05-26 (×2): 1000 ug via ORAL
  Filled 2016-05-25 (×2): qty 1

## 2016-05-25 MED ORDER — DONEPEZIL HCL 5 MG PO TABS
5.0000 mg | ORAL_TABLET | Freq: Every day | ORAL | Status: DC
  Administered 2016-05-25: 5 mg via ORAL
  Filled 2016-05-25: qty 1

## 2016-05-25 MED ORDER — ACETAMINOPHEN 325 MG PO TABS
650.0000 mg | ORAL_TABLET | Freq: Four times a day (QID) | ORAL | Status: DC | PRN
Start: 2016-05-25 — End: 2016-05-26

## 2016-05-25 MED ORDER — TAMSULOSIN HCL 0.4 MG PO CAPS
0.4000 mg | ORAL_CAPSULE | Freq: Two times a day (BID) | ORAL | Status: DC
Start: 2016-05-25 — End: 2016-05-26
  Administered 2016-05-25 – 2016-05-26 (×3): 0.4 mg via ORAL
  Filled 2016-05-25 (×3): qty 1

## 2016-05-25 MED ORDER — VENLAFAXINE HCL ER 75 MG PO CP24
150.0000 mg | ORAL_CAPSULE | Freq: Every day | ORAL | Status: DC
  Administered 2016-05-25 – 2016-05-26 (×2): 150 mg via ORAL
  Filled 2016-05-25 (×2): qty 2

## 2016-05-25 MED ORDER — POLYETHYLENE GLYCOL 3350 17 G PO PACK
17.0000 g | PACK | Freq: Two times a day (BID) | ORAL | Status: DC
  Administered 2016-05-25 – 2016-05-26 (×3): 17 g via ORAL
  Filled 2016-05-25 (×3): qty 1

## 2016-05-25 MED ORDER — MAGIC MOUTHWASH
5.0000 mL | Freq: Three times a day (TID) | ORAL | Status: DC | PRN
  Filled 2016-05-25: qty 5

## 2016-05-25 MED ORDER — PROCHLORPERAZINE EDISYLATE 5 MG/ML IJ SOLN
10.0000 mg | Freq: Four times a day (QID) | INTRAMUSCULAR | Status: DC | PRN
  Filled 2016-05-25: qty 2

## 2016-05-25 MED ORDER — ONDANSETRON HCL 4 MG PO TABS: 4.0000 mg | ORAL_TABLET | Freq: Four times a day (QID) | ORAL | Status: DC | PRN

## 2016-05-25 MED ORDER — QUETIAPINE FUMARATE 25 MG PO TABS
50.0000 mg | ORAL_TABLET | Freq: Every day | ORAL | Status: DC
  Administered 2016-05-25: 50 mg via ORAL
  Filled 2016-05-25: qty 2

## 2016-05-25 MED ORDER — MAGIC MOUTHWASH W/LIDOCAINE: 5.0000 mL | Freq: Three times a day (TID) | ORAL | Status: DC | PRN

## 2016-05-25 MED ORDER — ACETAMINOPHEN 650 MG RE SUPP: 650.0000 mg | Freq: Four times a day (QID) | RECTAL | Status: DC | PRN

## 2016-05-25 MED ORDER — HEPARIN SODIUM (PORCINE) 5000 UNIT/ML IJ SOLN
5000.0000 [IU] | Freq: Three times a day (TID) | INTRAMUSCULAR | Status: DC
  Administered 2016-05-25: 5000 [IU] via SUBCUTANEOUS
  Filled 2016-05-25: qty 1

## 2016-05-25 MED ORDER — BOOST / RESOURCE BREEZE PO LIQD
237.0000 mL | Freq: Four times a day (QID) | ORAL | Status: DC
  Administered 2016-05-25 – 2016-05-26 (×5): 1 via ORAL

## 2016-05-25 MED ORDER — SODIUM CHLORIDE 0.9 % IV SOLN
INTRAVENOUS | Status: DC
  Administered 2016-05-25 (×3): via INTRAVENOUS

## 2016-05-25 MED ORDER — VITAMIN B-1 100 MG PO TABS
100.0000 mg | ORAL_TABLET | Freq: Every day | ORAL | Status: DC
Start: 2016-05-25 — End: 2016-05-26
  Administered 2016-05-25 – 2016-05-26 (×2): 100 mg via ORAL
  Filled 2016-05-25 (×2): qty 1

## 2016-05-25 MED ORDER — QUETIAPINE FUMARATE 25 MG PO TABS: 100.0000 mg | ORAL_TABLET | Freq: Every day | ORAL | Status: DC

## 2016-05-25 MED ORDER — NADOLOL 20 MG PO TABS
10.0000 mg | ORAL_TABLET | Freq: Every day | ORAL | Status: DC
  Administered 2016-05-25: 09:00:00 10 mg via ORAL
  Filled 2016-05-25: qty 1

## 2016-05-25 MED ORDER — MAGNESIUM OXIDE 400 (241.3 MG) MG PO TABS
800.0000 mg | ORAL_TABLET | Freq: Three times a day (TID) | ORAL | Status: DC
Start: 2016-05-25 — End: 2016-05-26
  Administered 2016-05-25 – 2016-05-26 (×5): 800 mg via ORAL
  Filled 2016-05-25 (×5): qty 2

## 2016-05-25 MED ORDER — ALUM & MAG HYDROXIDE-SIMETH 200-200-20 MG/5ML PO SUSP: 15.0000 mL | ORAL | Status: DC | PRN

## 2016-05-25 NOTE — Progress Notes (Signed)
Patient seen this am.  I concur with Dr Tinnie Gens plan for IVF given orthostasis.  Will consult palliative care, oncology (prognosis), speech and Psych as well as PT  Decrease IVF to 100 ml/hr

## 2016-05-25 NOTE — H&P (Addendum)
[name] is an 71 y.o. male.   Chief Complaint: Near syncope HPI: The patient with past medical history of pharyngeal cancer, alcohol abuse and cirrhosis presents emergency department complaining of feeling faint. The patient reportedly stood up and became dizzy. He fell but did not loose consciousness. CT of his head in the emergency department was negative for acute intracranial lesions. He is not a good historian but admits that it is difficult for him to eat due to throat pain. He was found to be orthostatic in the emergency department. Laboratory evaluation revealed acute kidney injury as well which prompted the emergency department staff to call the hospitalist service for further management.  Past Medical History:  Diagnosis Date  . Alcohol abuse   . Cirrhosis ([place])   . COPD (chronic obstructive pulmonary disease) ([place])   . Hepatitis C   . Malnutrition ([place])   . Throat cancer [facility])     Past Surgical History:  Procedure Laterality Date  . TONSILLECTOMY     age was 61  . VASECTOMY      Family History  Problem Relation Age of Onset  . Breast cancer Mother    Social History:  reports that he has been smoking Cigarettes.  He has a 31.50 pack-year smoking history. He has never used smokeless tobacco. He reports that he drinks about 0.6 oz of alcohol per week . He reports that he does not use drugs.  Allergies:  Allergies  Allergen Reactions  . Penicillins Other (See Comments)    Reaction: unknown Patient is not able to answer follow up questions    Medications Prior to Admission  Medication Sig Dispense Refill  . ADVAIR DISKUS 250-50 MCG/DOSE AEPB Inhale 2 puffs into the lungs daily.    Marland Kitchen alum & mag hydroxide-simeth (MAALOX/MYLANTA) 200-200-20 MG/5ML suspension Take 15 mLs by mouth as needed for indigestion or heartburn. 355 mL 0  . Cholecalciferol (VITAMIN D3) 5000 units TABS Take 5,000 Units by mouth daily.    Marland Kitchen donepezil (ARICEPT) 5 MG tablet Take 5 mg by mouth at  bedtime.     . folic acid (FOLVITE) 1 MG tablet Take 1 mg by mouth daily.    . furosemide (LASIX) 20 MG tablet Take 20 mg by mouth daily.     Marland Kitchen lactose free nutrition (BOOST PLUS) LIQD Take 237 mLs by mouth 4 (four) times daily.    . magic mouthwash w/lidocaine SOLN Take 5 mLs by mouth 3 (three) times daily as needed for mouth pain. 300 mL 0  . magnesium oxide (MAG-OX) 400 MG tablet Take 800 mg by mouth 3 (three) times daily.     . meloxicam (MOBIC) 7.5 MG tablet Take 7.5 mg by mouth daily.     Marland Kitchen morphine 10 MG/5ML solution Take 2.6 mLs by mouth every 4 (four) hours as needed for pain. Take 2.6 mLs (52 MG) by mouth every 4 (four) hours as needed for pain.    . nadolol (CORGARD) 20 MG tablet Take 10 mg by mouth daily.     . ondansetron (ZOFRAN) 8 MG tablet Take 1 tablet (8 mg total) by mouth 2 (two) times daily as needed. (Patient taking differently: Take 8 mg by mouth 2 (two) times daily as needed for nausea or vomiting. ) 30 tablet 1  . polyethylene glycol (MIRALAX / GLYCOLAX) packet Take 17 g by mouth 2 (two) times daily.     . prochlorperazine (COMPAZINE) 10 MG tablet Take 1 tablet (10 mg total) by mouth every  6 (six) hours as needed (Nausea or vomiting). 30 tablet 1  . QUEtiapine (SEROQUEL) 50 MG tablet Take 100 mg by mouth at bedtime.     Marland Kitchen SPIRIVA HANDIHALER 18 MCG inhalation capsule Place 18 mcg into inhaler and inhale daily.     Marland Kitchen spironolactone (ALDACTONE) 50 MG tablet Take 50 mg by mouth daily.     . sucralfate (CARAFATE) 1 g tablet Take 1 tablet (1 g total) by mouth 3 (three) times daily. Dissolve in 4 tbs warm water, swish and swallow 90 tablet 2  . tamsulosin (FLOMAX) 0.4 MG CAPS capsule Take 0.4 mg by mouth 2 (two) times daily.     Marland Kitchen thiamine 100 MG tablet Take 100 mg by mouth daily.    . traZODone (DESYREL) 50 MG tablet Take 50 mg by mouth at bedtime.    Marland Kitchen venlafaxine XR (EFFEXOR-XR) 150 MG 24 hr capsule Take 150 mg by mouth daily with breakfast.     . vitamin B-12 (CYANOCOBALAMIN)  1000 MCG tablet Take 1,000 mcg by mouth daily.      Results for orders placed or performed during the hospital encounter of 05/24/16 (from the past 48 hour(s))  Basic metabolic panel     Status: Abnormal   Collection Time: 05/24/16  9:47 PM  Result Value Ref Range   Sodium 128 (L) 135 - 145 mmol/L   Potassium 4.5 3.5 - 5.1 mmol/L   Chloride 92 (L) 101 - 111 mmol/L   CO2 31 22 - 32 mmol/L   Glucose, Bld 109 (H) 65 - 99 mg/dL   BUN 25 (H) 6 - 20 mg/dL   Creatinine, Ser 1.58 (H) 0.61 - 1.24 mg/dL   Calcium 9.2 8.9 - 10.3 mg/dL   GFR calc non Af Amer 42 (L) >60 mL/min   GFR calc Af Amer 48 (L) >60 mL/min    Comment: (NOTE) The eGFR has been calculated using the CKD EPI equation. This calculation has not been validated in all clinical situations. eGFR's persistently <60 mL/min signify possible Chronic Kidney Disease.    Anion gap 5 5 - 15  CBC     Status: Abnormal   Collection Time: 05/24/16  9:47 PM  Result Value Ref Range   WBC 5.5 3.8 - 10.6 K/uL   RBC 3.11 (L) 4.40 - 5.90 MIL/uL   Hemoglobin 10.7 (L) 13.0 - 18.0 g/dL   HCT 30.2 (L) 40.0 - 52.0 %   MCV 97.3 80.0 - 100.0 fL   MCH 34.3 (H) 26.0 - 34.0 pg   MCHC 35.3 32.0 - 36.0 g/dL   RDW 17.9 (H) 11.5 - 14.5 %   Platelets 257 150 - 440 K/uL  Hepatic function panel     Status: Abnormal   Collection Time: 05/24/16  9:47 PM  Result Value Ref Range   Total Protein 6.7 6.5 - 8.1 g/dL   Albumin 3.2 (L) 3.5 - 5.0 g/dL   AST 51 (H) 15 - 41 U/L   ALT 32 17 - 63 U/L   Alkaline Phosphatase 69 38 - 126 U/L   Total Bilirubin 0.8 0.3 - 1.2 mg/dL   Bilirubin, Direct 0.3 0.1 - 0.5 mg/dL   Indirect Bilirubin 0.5 0.3 - 0.9 mg/dL  Lipase, blood     Status: None   Collection Time: 05/24/16  9:47 PM  Result Value Ref Range   Lipase 41 11 - 51 U/L  Ethanol     Status: None   Collection Time: 05/24/16  9:47 PM  Result  Value Ref Range   Alcohol, Ethyl (B) <5 <5 mg/dL    Comment:        LOWEST DETECTABLE LIMIT FOR SERUM ALCOHOL IS 5  mg/dL FOR MEDICAL PURPOSES ONLY   TSH     Status: None   Collection Time: 05/24/16  9:47 PM  Result Value Ref Range   TSH 4.175 0.350 - 4.500 uIU/mL    Comment: Performed by a 3rd Generation assay with a functional sensitivity of <=0.01 uIU/mL.   Ct Head Wo Contrast  Result Date: 05/24/2016 CLINICAL DATA:  71 y/o M; status post fall with head injury. History of throat cancer. EXAM: CT HEAD WITHOUT CONTRAST TECHNIQUE: Contiguous axial images were obtained from the base of the skull through the vertex without intravenous contrast. COMPARISON:  05/13/2016 CT head. FINDINGS: Brain: No evidence of acute infarction, hemorrhage, hydrocephalus, extra-axial collection or mass lesion/mass effect. Stable right anterior perisylvian encephalomalacia and ex vacuo dilatation of frontal horn of right lateral ventricle compatible with sequelae of chronic infarction. Stable mild chronic microvascular ischemic changes of white matter and parenchymal volume loss. Vascular: Extensive calcific atherosclerosis of cavernous and paraclinoid internal carotid arteries. Skull: Normal. Negative for fracture or focal lesion. Sinuses/Orbits: Trace opacification of left mastoid tip. Bilateral maxillary sinus opacification. Otherwise normally aerated visualized paranasal sinuses. Orbits are unremarkable. High-riding right jugular bulb with thin sigmoid plate. Other: None. IMPRESSION: 1. No acute intracranial abnormality identified. 2. Stable chronic right anterior perisylvian infarction. 3. Stable mild chronic microvascular ischemic changes and mild parenchymal volume loss of the brain. Electronically Signed   By: Kristine Garbe M.D.   On: 05/24/2016 22:55   Dg Chest Port 1 View  Result Date: 05/25/2016 CLINICAL DATA:  Acute onset of syncope.  Initial encounter. EXAM: PORTABLE CHEST 1 VIEW COMPARISON:  Chest radiograph performed 09/04/2012 FINDINGS: The lungs are well-aerated and clear. There is no evidence of focal  opacification, pleural effusion or pneumothorax. The cardiomediastinal silhouette is within normal limits. No acute osseous abnormalities are seen. IMPRESSION: No acute cardiopulmonary process seen. Electronically Signed   By: Garald Balding M.D.   On: 05/25/2016 00:10    Review of Systems  Constitutional: Negative for chills and fever.  HENT: Negative for sore throat and tinnitus.   Eyes: Negative for blurred vision and redness.  Respiratory: Negative for cough and shortness of breath.   Cardiovascular: Negative for chest pain, palpitations, orthopnea and PND.  Gastrointestinal: Negative for abdominal pain, diarrhea, nausea and vomiting.  Genitourinary: Negative for dysuria, frequency and urgency.  Musculoskeletal: Negative for joint pain and myalgias.  Skin: Negative for rash.       No lesions  Neurological: Negative for speech change, focal weakness and weakness.  Endo/Heme/Allergies: Does not bruise/bleed easily.       No temperature intolerance  Psychiatric/Behavioral: Negative for depression and suicidal ideas.    Blood pressure (!) 136/54, pulse (!) 59, temperature 98.4 F (36.9 C), temperature source Oral, resp. rate 19, height 5' 7"  (1.702 m), weight 23.1 kg (51 lb), SpO2 100 %. Physical Exam  Vitals reviewed. Constitutional: He is oriented to person, place, and time. He appears well-developed. He appears cachectic. No distress.  HENT:  Head: Normocephalic and atraumatic.  Mouth/Throat: Oropharynx is clear and moist.  Eyes: Conjunctivae and EOM are normal. Pupils are equal, round, and reactive to light. No scleral icterus.  Neck: Normal range of motion. Neck supple. No JVD present. No tracheal deviation present. No thyromegaly present.  Cardiovascular: Normal rate, regular rhythm and normal heart  sounds.  Exam reveals no gallop and no friction rub.   No murmur heard. Respiratory: Effort normal and breath sounds normal. No respiratory distress. He has no wheezes.  GI: Soft.  Bowel sounds are normal. He exhibits no distension. There is no tenderness.  Genitourinary:  Genitourinary Comments: Deferred  Musculoskeletal: Normal range of motion. He exhibits no edema.  Lymphadenopathy:    He has no cervical adenopathy.  Neurological: He is alert and oriented to person, place, and time. No cranial nerve deficit.  Skin: Skin is warm and dry. No rash noted. No erythema.  Psychiatric: He has a normal mood and affect. His behavior is normal. Judgment and thought content normal.     Assessment/Plan This is a 71 year old male admitted for near-syncope. 1. Acute kidney injury: As the patient has not been able to eat well he is likely dehydrated. Hydrate with intravenous saline. Avoid nephrotoxic agents such as NSAIDs. 2. Near-syncope: Secondary to orthostatic hypotension. Replete volume 3. Pharyngeal carcinoma: Manage pain. Consider palliative care 4. COPD: Continue inhaled corticosteroid as well as Spiriva. Albuterol as needed. 5. Cirrhosis: Continue Lasix and spironolactone. Nadolol for varices. Continue thiamine and folic acid supplementation 6. BPH: Continue Flomax 7. Psychiatric issues: Continue Effexor, Seroquel and Aricept. He may need to be watched for fall precautions. Consider holding trazodone or Seroquel as this may contribute to his tendency to fall at night. 8. DVT prophylaxis: Heparin 9. GI prophylaxis: Sucralfate per home regimen The patient is a full code. Time spent on admission orders and patient care approximately 45 minutes  Harrie Foreman, MD 05/25/2016, 6:09 AM

## 2016-05-25 NOTE — Progress Notes (Signed)
Pnt admit overnight. No issues or concerns over night. Pnt is an unreliable medical historian and unable to complete admission profile. Day nurse aware admission profile incomplete and is now assuming pnt care.

## 2016-05-25 NOTE — Consult Note (Signed)
[facility] Face-to-Face Psychiatry Consult   Reason for Consult:  71 year old man with a history of cirrhosis alcohol abuse dementia. Consult specifically for assessment of his psychiatric medicine Referring Physician:  Mody Patient Identification: [name] MRN:  [id number] Principal Diagnosis: Dementia with behavioral disturbance Diagnosis:   Patient Active Problem List   Diagnosis Date Noted  . Protein-calorie malnutrition, severe [E43] 05/14/2016  . Throat cancer ([place]) [C14.0]   . Palliative care encounter [Z51.5]   . AKI (acute kidney injury) ([place]) [N17.9] 05/13/2016  . Goals of care, counseling/discussion [Z71.89] 03/01/2016  . 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 Alcohol abuse [F10.10] 11/18/2015  . Dementia with behavioral disturbance [F03.91] 10/21/2015    Total Time spent with patient: 1 hour  Subjective:   [name] is a 71 y.o. male patient admitted with "I think I'm alright".  HPI:  Patient interviewed chart reviewed. This is a 71 year old man who is in the hospital with malnutrition and dizziness and falls. Question is essentially whether any of his current medication can be removed and decrease his risk of falls. On interview today the patient has no psychiatric complaints. Denies feeling depressed. Says he has chronic problems with sleep but doesn't worry about it too much. Admits that his appetite is poor but that is also chronic. No suicidal thoughts at all. Denies that he has any hallucinations. Patient is pleasant enough to talk to but tends to ramble. He told us a story that ended with his claiming that he has $100 million in the bank and he is planning quite a bit of charity from at all of which sounded pretty dubious. Patient admits that he is no longer able to drink. He does have a recognition that he has orthostatic hypotension frequently. It looks like he is currently prescribes Seroquel 100 mg at night trazodone 50 mg at night and venlafaxine  extended release 150 mg a day as well as Aricept at a low dose.  Social history: Currently residing in a group home. He finds it reasonably acceptable. He has family but he has little contact with them anymore.  Medical history: Patient has cirrhosis. History of alcohol dependence. Quite a bit of severe weight loss.  Substance abuse history: Long history of alcohol abuse resulting in the cirrhosis and chronic medical problems  Past Psychiatric History: Patient has been seen in the past primarily for alcohol abuse and confusion in the hospital. No history of suicide attempts or violence. He is on antidepressant medicine currently although it's not clear when he's had a diagnosis of major depression. Presumably the other medicines are for sleep and agitation. I don't believe she's had psychiatric hospitalizations. No history of suicidality  Risk to Self: Is patient at risk for suicide?: No Risk to Others:   Prior Inpatient Therapy:   Prior Outpatient Therapy:    Past Medical History:  Past Medical History:  Diagnosis Date  . Alcohol abuse   . Cirrhosis ([place])   . COPD (chronic obstructive pulmonary disease) ([place])   . Hepatitis C   . Malnutrition ([place])   . Throat cancer [facility])     Past Surgical History:  Procedure Laterality Date  . TONSILLECTOMY     age was 16  . VASECTOMY     Family History:  Family History  Problem Relation Age of Onset  . Breast cancer Mother    Family Psychiatric  History: Does not know of any Social History:  History  Alcohol Use  . 0.6 oz/week  .  1 Cans of beer per week     History  Drug Use No    Social History   Social History  . Marital status: Divorced    Spouse name: N/A  . Number of children: N/A  . Years of education: N/A   Social History Main Topics  . Smoking status: Current Every Day Smoker    Packs/day: 0.50    Years: 63.00    Types: Cigarettes  . Smokeless tobacco: Never Used  . Alcohol use 0.6 oz/week    1 Cans of beer per  week  . Drug use: No  . Sexual activity: Not Asked   Other Topics Concern  . None   Social History Narrative  . None   Additional Social History:    Allergies:   Allergies  Allergen Reactions  . Penicillins Other (See Comments)    Reaction: unknown Patient is not able to answer follow up questions    Labs:  Results for orders placed or performed during the hospital encounter of 05/24/16 (from the past 48 hour(s))  Basic metabolic panel     Status: Abnormal   Collection Time: 05/24/16  9:47 PM  Result Value Ref Range   Sodium 128 (L) 135 - 145 mmol/L   Potassium 4.5 3.5 - 5.1 mmol/L   Chloride 92 (L) 101 - 111 mmol/L   CO2 31 22 - 32 mmol/L   Glucose, Bld 109 (H) 65 - 99 mg/dL   BUN 25 (H) 6 - 20 mg/dL   Creatinine, Ser 1.58 (H) 0.61 - 1.24 mg/dL   Calcium 9.2 8.9 - 10.3 mg/dL   GFR calc non Af Amer 42 (L) >60 mL/min   GFR calc Af Amer 48 (L) >60 mL/min    Comment: (NOTE) The eGFR has been calculated using the CKD EPI equation. This calculation has not been validated in all clinical situations. eGFR's persistently <60 mL/min signify possible Chronic Kidney Disease.    Anion gap 5 5 - 15  CBC     Status: Abnormal   Collection Time: 05/24/16  9:47 PM  Result Value Ref Range   WBC 5.5 3.8 - 10.6 K/uL   RBC 3.11 (L) 4.40 - 5.90 MIL/uL   Hemoglobin 10.7 (L) 13.0 - 18.0 g/dL   HCT 30.2 (L) 40.0 - 52.0 %   MCV 97.3 80.0 - 100.0 fL   MCH 34.3 (H) 26.0 - 34.0 pg   MCHC 35.3 32.0 - 36.0 g/dL   RDW 17.9 (H) 11.5 - 14.5 %   Platelets 257 150 - 440 K/uL  Hepatic function panel     Status: Abnormal   Collection Time: 05/24/16  9:47 PM  Result Value Ref Range   Total Protein 6.7 6.5 - 8.1 g/dL   Albumin 3.2 (L) 3.5 - 5.0 g/dL   AST 51 (H) 15 - 41 U/L   ALT 32 17 - 63 U/L   Alkaline Phosphatase 69 38 - 126 U/L   Total Bilirubin 0.8 0.3 - 1.2 mg/dL   Bilirubin, Direct 0.3 0.1 - 0.5 mg/dL   Indirect Bilirubin 0.5 0.3 - 0.9 mg/dL  Lipase, blood     Status: None    Collection Time: 05/24/16  9:47 PM  Result Value Ref Range   Lipase 41 11 - 51 U/L  Ethanol     Status: None   Collection Time: 05/24/16  9:47 PM  Result Value Ref Range   Alcohol, Ethyl (B) <5 <5 mg/dL    Comment:  LOWEST DETECTABLE LIMIT FOR SERUM ALCOHOL IS 5 mg/dL FOR MEDICAL PURPOSES ONLY   TSH     Status: None   Collection Time: 05/24/16  9:47 PM  Result Value Ref Range   TSH 4.175 0.350 - 4.500 uIU/mL    Comment: Performed by a 3rd Generation assay with a functional sensitivity of <=0.01 uIU/mL.  Urinalysis, Complete w Microscopic     Status: Abnormal   Collection Time: 05/25/16  5:40 AM  Result Value Ref Range   Color, Urine YELLOW (A) YELLOW   APPearance CLEAR (A) CLEAR   Specific Gravity, Urine 1.006 1.005 - 1.030   pH 7.0 5.0 - 8.0   Glucose, UA NEGATIVE NEGATIVE mg/dL   Hgb urine dipstick NEGATIVE NEGATIVE   Bilirubin Urine NEGATIVE NEGATIVE   Ketones, ur NEGATIVE NEGATIVE mg/dL   Protein, ur NEGATIVE NEGATIVE mg/dL   Nitrite NEGATIVE NEGATIVE   Leukocytes, UA NEGATIVE NEGATIVE   RBC / HPF 0-5 0 - 5 RBC/hpf   WBC, UA 0-5 0 - 5 WBC/hpf   Bacteria, UA NONE SEEN NONE SEEN   Squamous Epithelial / LPF 0-5 (A) NONE SEEN    Current Facility-Administered Medications  Medication Dose Route Frequency Provider Last Rate Last Dose  . 0.9 %  sodium chloride infusion   Intravenous Continuous Sital Mody, MD 100 mL/hr at 05/25/16 1230    . acetaminophen (TYLENOL) tablet 650 mg  650 mg Oral Q6H PRN Harrie Foreman, MD       Or  . acetaminophen (TYLENOL) suppository 650 mg  650 mg Rectal Q6H PRN Harrie Foreman, MD      . alum & mag hydroxide-simeth (MAALOX/MYLANTA) 200-200-20 MG/5ML suspension 15 mL  15 mL Oral PRN Harrie Foreman, MD      . cholecalciferol (VITAMIN D) tablet 5,000 Units  5,000 Units Oral Daily Harrie Foreman, MD   5,000 Units at 05/25/16 [phone]  . docusate sodium (COLACE) capsule 100 mg  100 mg Oral BID Harrie Foreman, MD   100 mg at 05/25/16  0910  . donepezil (ARICEPT) tablet 5 mg  5 mg Oral QHS Harrie Foreman, MD      . enoxaparin (LOVENOX) injection 40 mg  40 mg Subcutaneous Q24H Sital Mody, MD      . feeding supplement (BOOST / RESOURCE BREEZE) liquid 1 Container  237 mL Oral QID Harrie Foreman, MD   1 Container at 05/25/16 1723  . folic acid (FOLVITE) tablet 1 mg  1 mg Oral Daily Harrie Foreman, MD   1 mg at 05/25/16 9604  . furosemide (LASIX) tablet 20 mg  20 mg Oral Daily Harrie Foreman, MD   20 mg at 05/25/16 0910  . HYDROmorphone (DILAUDID) injection 1 mg  1 mg Intravenous Q4H PRN Harrie Foreman, MD      . magic mouthwash  5 mL Oral TID PRN Harrie Foreman, MD       And  . lidocaine (XYLOCAINE) 2 % viscous mouth solution 5 mL  5 mL Mouth/Throat TID PRN Harrie Foreman, MD   5 mL at 05/25/16 0905  . magnesium oxide (MAG-OX) tablet 800 mg  800 mg Oral TID Harrie Foreman, MD   800 mg at 05/25/16 1724  . mometasone-formoterol (DULERA) 200-5 MCG/ACT inhaler 2 puff  2 puff Inhalation BID Harrie Foreman, MD   2 puff at 05/25/16 0907  . morphine 10 MG/5ML solution 5 mg  5 mg Oral Q4H PRN Norva Riffle  Marcille Blanco, MD      . nadolol (CORGARD) tablet 10 mg  10 mg Oral Daily Harrie Foreman, MD   10 mg at 05/25/16 0910  . ondansetron (ZOFRAN) tablet 4 mg  4 mg Oral Q6H PRN Harrie Foreman, MD       Or  . ondansetron [facility]) injection 4 mg  4 mg Intravenous Q6H PRN Harrie Foreman, MD      . polyethylene glycol [facility] / GLYCOLAX) packet 17 g  17 g Oral BID Harrie Foreman, MD   17 g at 05/25/16 0905  . prochlorperazine (COMPAZINE) injection 10 mg  10 mg Intravenous Q6H PRN Harrie Foreman, MD      . QUEtiapine (SEROQUEL) tablet 50 mg  50 mg Oral QHS Gonzella Lex, MD      . spironolactone (ALDACTONE) tablet 50 mg  50 mg Oral Daily Harrie Foreman, MD   50 mg at 05/25/16 0909  . sucralfate (CARAFATE) tablet 1 g  1 g Oral TID Harrie Foreman, MD   1 g at 05/25/16 1723  . tamsulosin (FLOMAX) capsule 0.4  mg  0.4 mg Oral BID Harrie Foreman, MD   0.4 mg at 05/25/16 1610  . thiamine (VITAMIN B-1) tablet 100 mg  100 mg Oral Daily Harrie Foreman, MD   100 mg at 05/25/16 0909  . tiotropium (SPIRIVA) inhalation capsule 18 mcg  18 mcg Inhalation Daily Harrie Foreman, MD   18 mcg at 05/25/16 [phone]  . venlafaxine XR (EFFEXOR-XR) 24 hr capsule 150 mg  150 mg Oral Q breakfast Harrie Foreman, MD   150 mg at 05/25/16 1230  . vitamin B-12 (CYANOCOBALAMIN) tablet 1,000 mcg  1,000 mcg Oral Daily Harrie Foreman, MD   1,000 mcg at 05/25/16 0909   Facility-Administered Medications Ordered in Other Encounters  Medication Dose Route Frequency Provider Last Rate Last Dose  . heparin lock flush 100 unit/mL  500 Units Intravenous Once Lloyd Huger, MD        Musculoskeletal: Strength & Muscle Tone: decreased Gait & Station: unsteady Patient leans: Front  Psychiatric Specialty Exam: Physical Exam  Nursing note and vitals reviewed. Constitutional: He appears well-developed. He appears listless. He appears cachectic.  HENT:  Head: Normocephalic and atraumatic.  Eyes: Conjunctivae are normal. Pupils are equal, round, and reactive to light.  Neck: Normal range of motion.  Cardiovascular: Normal heart sounds.   Respiratory: Effort normal.  GI: Soft.  Musculoskeletal: Normal range of motion.  Neurological: He appears listless.  Skin: Skin is warm and dry.  Psychiatric: Judgment normal. His affect is blunt. His speech is delayed. He is slowed. Thought content is not paranoid. He expresses no homicidal and no suicidal ideation. He exhibits abnormal recent memory.    Review of Systems  Constitutional: Negative.   HENT: Negative.   Eyes: Negative.   Respiratory: Negative.   Cardiovascular: Negative.   Gastrointestinal: Negative.   Musculoskeletal: Negative.   Skin: Negative.   Neurological: Negative.   Psychiatric/Behavioral: Positive for memory loss. Negative for depression,  hallucinations, substance abuse and suicidal ideas. The patient is not nervous/anxious and does not have insomnia.     Blood pressure (!) 147/59, pulse (!) 55, temperature 98.6 F (37 C), temperature source Oral, resp. rate 18, height 5' 7"  (1.702 m), weight 52.9 kg (116 lb 9 oz), SpO2 100 %.Body mass index is 18.26 kg/m.  General Appearance: Casual  Eye Contact:  Minimal  Speech:  Slow  Volume:  Decreased  Mood:  Euthymic  Affect:  Constricted  Thought Process:  Disorganized  Orientation:  Full (Time, Place, and Person)  Thought Content:  Illogical, Rumination and Tangential  Suicidal Thoughts:  No  Homicidal Thoughts:  No  Memory:  Immediate;   Fair Recent;   Fair Remote;   Fair  Judgement:  Impaired  Insight:  Shallow  Psychomotor Activity:  Decreased  Concentration:  Concentration: Poor  Recall:  Poor  Fund of Knowledge:  Fair  Language:  Fair  Akathisia:  No  Handed:  Right  AIMS (if indicated):     Assets:  Housing Resilience  ADL's:  Impaired  Cognition:  Impaired,  Mild and Moderate  Sleep:        Treatment Plan Summary: Medication management and Plan 71 year old gentleman with chronic mild dementia and a tendency towards confusion when medically sick. Recent falls. Multiple obvious reason for falls including malnutrition and dehydration. Also on multiple medicines that could increase risk of falls including his blood pressure medicine. I am going to discontinue the trazodone and cut the Seroquel down to 50 mg at night to help to decrease the risk of orthostasis and falls. Continue the venlafaxine which is not likely to contribute to the problem. I will follow-up as needed.  Disposition: Patient does not meet criteria for psychiatric inpatient admission. Supportive therapy provided about ongoing stressors.  Alethia Berthold, MD 05/25/2016 7:22 PM

## 2016-05-25 NOTE — Evaluation (Signed)
Physical Therapy Evaluation Patient Details Name: [name] MRN: [id number] DOB: Mar 09, 1945 Today's Date: 05/25/2016   History of Present Illness  presented to ER secondary to dizziness, falls; admitted with acute kidney injury, dehydration.  Of note, patient with stage IV pharyngeal CA, hep C, COPD and ETOH/cirrhosis.  Clinical Impression  Upon evaluation, patient alert and oriented to basic information; generally confused to more complex information/recall, limited insight into deficits and need for assist.  Bilat UE/LE strength and ROM grossly symmetrical and WFL for basic transfers and mobility; no focal weakness, sensory deficit or pain noted.  Mild drop in BP with transition to upright, but appears to stabilize in standing (see flowsheet for details).  Patient asymptomatic throughout and able to complete full lap around nursing station (200') without significant difficulty (cga).  Mild sway noted with functional activities, tends to reach/hold IV pole for support; may consider trial of assist device next session as appropriate. Would benefit from skilled PT to address above deficits and promote optimal return to PLOF; Recommend transition to [place] upon discharge from acute hospitalization.     Follow Up Recommendations Home health PT    Equipment Recommendations       Recommendations for Other Services       Precautions / Restrictions Precautions Precautions: Fall Restrictions Weight Bearing Restrictions: No      Mobility  Bed Mobility Overal bed mobility: Independent                Transfers Overall transfer level: Needs assistance   Transfers: Sit to/from Stand Sit to Stand: Supervision         General transfer comment: fair LE strength/power with movement transition  Ambulation/Gait Ambulation/Gait assistance: Min guard Ambulation Distance (Feet): 200 Feet Assistive device: None       General Gait Details: mild lateral sway without overt buckling or  LOB; fair cadence and gait speed.  Tends to reach for IV pole for stability; may consider trial of SPC/RW next session as needed.  Stairs            Wheelchair Mobility    Modified Rankin (Stroke Patients Only)       Balance Overall balance assessment: Needs assistance Sitting-balance support: No upper extremity supported;Feet supported Sitting balance-Leahy Scale: Good     Standing balance support: No upper extremity supported Standing balance-Leahy Scale: Fair                               Pertinent Vitals/Pain Pain Assessment: No/denies pain    Home Living Family/patient expects to be discharged to:: Group home               Home Equipment:  (walking stick)      Prior Function           Comments: Reports use of walking stick with mobility as needed; denies fall history outside of this admission (however, documentation from previous admissions suggests mutliple recent fall history)     Hand Dominance        Extremity/Trunk Assessment   Upper Extremity Assessment Upper Extremity Assessment: Overall WFL for tasks assessed    Lower Extremity Assessment Lower Extremity Assessment: Overall WFL for tasks assessed (grossly 4/5 throughout bilat LEs)       Communication   Communication: No difficulties  Cognition Arousal/Alertness: Awake/alert Behavior During Therapy: WFL for tasks assessed/performed  General Comments: questionable historian; impulsive; follows commands and demonstrates good participation with session      General Comments      Exercises     Assessment/Plan    PT Assessment Patient needs continued PT services  PT Problem List Decreased balance;Decreased mobility;Decreased strength;Decreased range of motion;Decreased activity tolerance;Decreased coordination;Decreased knowledge of use of DME;Decreased safety awareness;Decreased knowledge of precautions       PT  Treatment Interventions Gait training;Balance training;Functional mobility training;Therapeutic activities;DME instruction;Therapeutic exercise;Patient/family education    PT Goals (Current goals can be found in the Care Plan section)  Acute Rehab PT Goals Patient Stated Goal: To get better  PT Goal Formulation: With patient Time For Goal Achievement: 06/08/16 Potential to Achieve Goals: Good    Frequency Min 2X/week   Barriers to discharge Decreased caregiver support      Co-evaluation               End of Session Equipment Utilized During Treatment: Gait belt Activity Tolerance: Patient tolerated treatment well Patient left: in bed;with call bell/phone within reach;with bed alarm set Nurse Communication: Mobility status PT Visit Diagnosis: Muscle weakness (generalized) (M62.81);History of falling (Z91.81)    Time: 1779-3903 PT Time Calculation (min) (ACUTE ONLY): 18 min   Charges:   PT Evaluation $PT Eval Low Complexity: 1 Procedure     PT G Codes:       Talar Fraley H. Owens Shark, PT, DPT, NCS 05/25/16, 12:18 PM [phone]

## 2016-05-25 NOTE — Evaluation (Signed)
Clinical/Bedside Swallow Evaluation Patient Details  Name: [name] MRN: [id number] Date of Birth: 1945/05/11  Today's Date: 05/25/2016 Time: SLP Start Time (ACUTE ONLY): 1100 SLP Stop Time (ACUTE ONLY): 1200 SLP Time Calculation (min) (ACUTE ONLY): 60 min  Past Medical History:  Past Medical History:  Diagnosis Date  . Alcohol abuse   . Cirrhosis ([place])   . COPD (chronic obstructive pulmonary disease) ([place])   . Hepatitis C   . Malnutrition ([place])   . Throat cancer [facility])    Past Surgical History:  Past Surgical History:  Procedure Laterality Date  . TONSILLECTOMY     age was 60  . 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 The patient reportedly stood up and became dizzy. He fell but did not loose consciousness. CT of his head in the emergency department was negative for acute intracranial lesions. He is not a good historian but admits that it is difficult for him to eat due to a new and is throat. He was found to be orthostatic in the emergency department. Laboratory evaluation revealed acute kidney injury as well which prompted the emergency department staff to call the hospitalist service for further management. Currently, pt is up and walking to the bathroom w/ NSG; resting in bed. Pt is verbally conversive; soft vocal quality w/ low volume. A/O x3. Pt stated he has not had much appetite in the past 2-3 days. He endorsed some difficulty swallowing "tougher foods" such as meats and breads but "I do well swallowing liquids". Post recent d/c from this hospital, pt had a new referral for Home Palliative services  at [facility] where he resides.    Assessment / Plan /  Recommendation Clinical Impression  Pt does have baseline oropharyngeal phase dysphagia and appears at increased risk for aspiration. When following aspiration precautions and using few swallowing strategies including f/u, dry swallowing w/ boluses, pt appears able to tolerate trials of thin liquids and loose purees w/ no immediate, overt s/s of aspiration noted; oral phase grossly wfl for bolus management. Pt does c/o difficulty w/ increased textured foods and has not been attempting such.  SLP Visit Diagnosis: Dysphagia, oropharyngeal phase (R13.12)    Aspiration Risk  Mild aspiration risk;Risk for inadequate nutrition/hydration (-Moderate aspiration risk)    Diet Recommendation  Ful Liquid diet - thin liquid consistency; aspiration precautions/strategies  Medication Administration: Crushed with puree    Other  Recommendations Recommended Consults:  (Dietician f/u) Oral Care Recommendations: Oral care BID;Patient independent with oral care   Follow up Recommendations  (TBD) Palliative Care services     Frequency and Duration min 2x/week  1 week       Prognosis Prognosis for Safe Diet Advancement: Fair Barriers to Reach Goals: Severity of deficits      Swallow Study   General Date of Onset: 05/24/16 HPI: Pt is a 71 y/o male w/ h/o 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 The patient reportedly stood up and  became dizzy. He fell but did not loose consciousness. CT of his head in the emergency department was negative for acute intracranial lesions. He is not a good historian but admits that it is difficult for him to eat due to a new and is throat. He was found to be orthostatic in the emergency department. Laboratory evaluation  revealed acute kidney injury as well which prompted the emergency department staff to call the hospitalist service for further management. Currently, pt is up and walking to the bathroom w/ NSG; resting in bed. Pt is verbally conversive; soft vocal quality w/ low volume. A/O x3. Pt stated he has not had much appetite in the past 2-3 days. He endorsed some difficulty swallowing "tougher foods" such as meats and breads but "I do well swallowing liquids". Post recent d/c from this hospital, pt had a new referral for Home Palliative services  at [facility] where he resides.  Type of Study: Bedside Swallow Evaluation Previous Swallow Assessment: unsure Diet Prior to this Study: Dysphagia 3 (soft);Thin liquids Temperature Spikes Noted: No (wbc 5.5) Respiratory Status: Room air History of Recent Intubation: No Behavior/Cognition: Alert;Cooperative;Pleasant mood Oral Cavity Assessment:  (grossly wfl now but does have mouth sores at times) Oral Care Completed by SLP: Recent completion by staff (recommended to use magic mouthwash) Oral Cavity - Dentition: Edentulous Vision: Functional for self-feeding Self-Feeding Abilities: Able to feed self Patient Positioning: Upright in bed Baseline Vocal Quality: Low vocal intensity Volitional Cough: Strong Volitional Swallow: Able to elicit    Oral/Motor/Sensory Function Overall Oral Motor/Sensory Function: Within functional limits   Ice Chips Ice chips: Within functional limits Presentation: Spoon (fed; 2 trials)   Thin Liquid Thin Liquid: Within functional limits Presentation: Cup;Self Fed (small single sips) Other Comments: ~4-5 ozs total    Nectar Thick Nectar Thick Liquid: Not tested   Honey Thick Honey Thick Liquid: Not tested   Puree Puree: Within functional limits Presentation: Self Fed;Spoon (7-8 trials) Other Comments: noted the increased textured foods   Solid   GO   Solid: Not tested Other Comments: d/t baseline dysphagia          Orinda Kenner, MS, CCC-SLP [name] 05/25/2016,7:27 PM

## 2016-05-25 NOTE — Progress Notes (Addendum)
Please note, patient has a pending HOME PALLIATIVE referral, he was re-hospitalized prior to services being initiated. CSW [employer] made aware. Thank you. Flo Shanks RN, BSN, [place] [facility] [place], [facility] [phone] c

## 2016-05-25 NOTE — Progress Notes (Signed)
Patient told this nurse that he was having a friend bring in something to help him sleep.  Asked patient what exactly that meant. Patient told this nurse not to worry about it, to just not wake him up if he was asleep when I came in later.  I educated patient on the fact that he cannot be taking any medications that someone brings him.  This nurse made the charge nurse aware and signs placed on patient's door telling visitors to report to the nurse's station.  Dr. Weber Cooks on floor seeing patient and made him aware as well.  This nurse in direct view of patient's rrom for rest of this shift and no visitor at this time.  Clarise Cruz, RN

## 2016-05-25 NOTE — Consult Note (Signed)
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 Patient denies any loss of consciousness. He has a poor appetite, but states this is improving. He does not complain of mouth pain or sores today. He has no neurologic complaints. He does not complain of pain today. He denies any chest pain or shortness of breath. He denies any nausea, vomiting, constipation, or diarrhea. He has no urinary complaints. Patient otherwise feels well and offers no further specific complaints.  REVIEW OF SYSTEMS:   Review of Systems  Constitutional: Positive for malaise/fatigue. Negative for fever and weight loss.  HENT: Negative.   Respiratory: Negative.  Negative for cough and shortness of breath.   Cardiovascular: Negative.  Negative for chest pain and leg swelling.  Gastrointestinal: Negative.  Negative for abdominal pain.  Genitourinary: Negative.   Musculoskeletal: Negative.   Neurological: Positive for weakness. Negative for sensory change, focal weakness, loss of consciousness and headaches.  Psychiatric/Behavioral: Negative.  The patient is not nervous/anxious.     As per HPI. Otherwise, a complete review of systems is negative.  PAST MEDICAL HISTORY: Past Medical History:  Diagnosis Date  . Alcohol abuse   . Cirrhosis ([place])   . COPD (chronic obstructive pulmonary disease) ([place])   . Hepatitis C   . Malnutrition ([place])   . Throat cancer ([place])     PAST SURGICAL HISTORY: Past Surgical History:  Procedure Laterality Date  . TONSILLECTOMY      age was 82  . VASECTOMY      FAMILY HISTORY: Family History  Problem Relation Age of Onset  . Breast cancer Mother     ADVANCED DIRECTIVES (Y/N):  '@ADVDIR'$ @  HEALTH MAINTENANCE: Social History  Substance Use Topics  . Smoking status: Current Every Day Smoker    Packs/day: 0.50    Years: 63.00    Types: Cigarettes  . Smokeless tobacco: Never Used  . Alcohol use 0.6 oz/week    1 Cans of beer per week     Colonoscopy:  PAP:  Bone density:  Lipid panel:  Allergies  Allergen Reactions  . Penicillins Other (See Comments)    Reaction: unknown Patient is not able to answer follow up questions    Current Facility-Administered Medications  Medication Dose Route Frequency Provider Last Rate Last Dose  . 0.9 %  sodium chloride infusion   Intravenous Continuous Bettey Costa, MD 100 mL/hr at 05/25/16 2204    . acetaminophen (TYLENOL) tablet 650 mg  650 mg Oral Q6H PRN Harrie Foreman, MD       Or  . acetaminophen (TYLENOL) suppository 650 mg  650 mg Rectal Q6H PRN Harrie Foreman, MD      . alum & mag hydroxide-simeth (MAALOX/MYLANTA) 200-200-20 MG/5ML suspension 15 mL  15 mL Oral PRN Harrie Foreman, MD      . cholecalciferol (VITAMIN D) tablet 5,000 Units  5,000 Units Oral Daily Harrie Foreman, MD   5,000 Units at 05/25/16 [phone]  . docusate sodium (COLACE) capsule 100 mg  100 mg Oral BID Harrie Foreman, MD   100 mg at 05/25/16 0910  .  donepezil (ARICEPT) tablet 5 mg  5 mg Oral QHS Harrie Foreman, MD      . enoxaparin (LOVENOX) injection 40 mg  40 mg Subcutaneous Q24H Sital Mody, MD      . feeding supplement (BOOST / RESOURCE BREEZE) liquid 1 Container  237 mL Oral QID Harrie Foreman, MD   1 Container at 05/25/16 1723  . folic acid (FOLVITE) tablet 1 mg  1 mg Oral Daily Harrie Foreman, MD   1 mg at 05/25/16 0093  . furosemide (LASIX) tablet 20 mg  20 mg Oral Daily Harrie Foreman, MD   20 mg at 05/25/16 0910  . HYDROmorphone (DILAUDID) injection 1 mg  1 mg  Intravenous Q4H PRN Harrie Foreman, MD      . magic mouthwash  5 mL Oral TID PRN Harrie Foreman, MD       And  . lidocaine (XYLOCAINE) 2 % viscous mouth solution 5 mL  5 mL Mouth/Throat TID PRN Harrie Foreman, MD   5 mL at 05/25/16 0905  . magnesium oxide (MAG-OX) tablet 800 mg  800 mg Oral TID Harrie Foreman, MD   800 mg at 05/25/16 1724  . mometasone-formoterol (DULERA) 200-5 MCG/ACT inhaler 2 puff  2 puff Inhalation BID Harrie Foreman, MD   2 puff at 05/25/16 0907  . morphine 10 MG/5ML solution 5 mg  5 mg Oral Q4H PRN Harrie Foreman, MD      . nadolol (CORGARD) tablet 10 mg  10 mg Oral Daily Harrie Foreman, MD   10 mg at 05/25/16 0910  . ondansetron (ZOFRAN) tablet 4 mg  4 mg Oral Q6H PRN Harrie Foreman, MD       Or  . ondansetron [facility]) injection 4 mg  4 mg Intravenous Q6H PRN Harrie Foreman, MD      . polyethylene glycol [facility] / GLYCOLAX) packet 17 g  17 g Oral BID Harrie Foreman, MD   17 g at 05/25/16 0905  . prochlorperazine (COMPAZINE) injection 10 mg  10 mg Intravenous Q6H PRN Harrie Foreman, MD      . QUEtiapine (SEROQUEL) tablet 50 mg  50 mg Oral QHS Gonzella Lex, MD      . spironolactone (ALDACTONE) tablet 50 mg  50 mg Oral Daily Harrie Foreman, MD   50 mg at 05/25/16 0909  . sucralfate (CARAFATE) tablet 1 g  1 g Oral TID Harrie Foreman, MD   1 g at 05/25/16 1723  . tamsulosin (FLOMAX) capsule 0.4 mg  0.4 mg Oral BID Harrie Foreman, MD   0.4 mg at 05/25/16 8182  . thiamine (VITAMIN B-1) tablet 100 mg  100 mg Oral Daily Harrie Foreman, MD   100 mg at 05/25/16 0909  . tiotropium (SPIRIVA) inhalation capsule 18 mcg  18 mcg Inhalation Daily Harrie Foreman, MD   18 mcg at 05/25/16 [phone]  . venlafaxine XR (EFFEXOR-XR) 24 hr capsule 150 mg  150 mg Oral Q breakfast Harrie Foreman, MD   150 mg at 05/25/16 1230  . vitamin B-12 (CYANOCOBALAMIN) tablet 1,000 mcg  1,000 mcg Oral Daily Harrie Foreman, MD   1,000 mcg at 05/25/16 0909    Facility-Administered Medications Ordered in Other Encounters  Medication Dose Route Frequency Provider Last Rate Last Dose  . heparin lock flush 100 unit/mL  500 Units Intravenous Once Lloyd Huger, MD        OBJECTIVE: Vitals:  05/25/16 1343 05/25/16 2029  BP: (!) 147/59 122/68  Pulse: (!) 55 64  Resp: 18 20  Temp: 98.6 F (37 C) 99 F (37.2 C)     Body mass index is 18.26 kg/m.    ECOG FS:1 - Symptomatic but completely ambulatory  General: Well-developed, well-nourished, no acute distress. Eyes: Pink conjunctiva, anicteric sclera. HEENT: Normocephalic, moist mucous membranes, clear oropharnyx without erythema or exudate. Lungs: Clear to auscultation bilaterally. Heart: Regular rate and rhythm. No rubs, murmurs, or gallops. Abdomen: Soft, nontender, nondistended. No organomegaly noted, normoactive bowel sounds. Musculoskeletal: No edema, cyanosis, or clubbing. Neuro: Alert, answering all questions appropriately. Cranial nerves grossly intact. Skin: No rashes or petechiae noted. Psych: Normal affect. Lymphatics: No cervical, calvicular, axillary or inguinal LAD.   LAB RESULTS:  Lab Results  Component Value Date   NA 128 (L) 05/24/2016   K 4.5 05/24/2016   CL 92 (L) 05/24/2016   CO2 31 05/24/2016   GLUCOSE 109 (H) 05/24/2016   BUN 25 (H) 05/24/2016   CREATININE 1.58 (H) 05/24/2016   CALCIUM 9.2 05/24/2016   PROT 6.7 05/24/2016   ALBUMIN 3.2 (L) 05/24/2016   AST 51 (H) 05/24/2016   ALT 32 05/24/2016   ALKPHOS 69 05/24/2016   BILITOT 0.8 05/24/2016   GFRNONAA 42 (L) 05/24/2016   GFRAA 48 (L) 05/24/2016    Lab Results  Component Value Date   WBC 5.5 05/24/2016   NEUTROABS 3.1 05/13/2016   HGB 10.7 (L) 05/24/2016   HCT 30.2 (L) 05/24/2016   MCV 97.3 05/24/2016   PLT 257 05/24/2016     STUDIES: Ct Head Wo Contrast  Result Date: 05/24/2016 CLINICAL DATA:  72 y/o M; status post fall with head injury. History of throat cancer. EXAM: CT HEAD WITHOUT  CONTRAST TECHNIQUE: Contiguous axial images were obtained from the base of the skull through the vertex without intravenous contrast. COMPARISON:  05/13/2016 CT head. FINDINGS: Brain: No evidence of acute infarction, hemorrhage, hydrocephalus, extra-axial collection or mass lesion/mass effect. Stable right anterior perisylvian encephalomalacia and ex vacuo dilatation of frontal horn of right lateral ventricle compatible with sequelae of chronic infarction. Stable mild chronic microvascular ischemic changes of white matter and parenchymal volume loss. Vascular: Extensive calcific atherosclerosis of cavernous and paraclinoid internal carotid arteries. Skull: Normal. Negative for fracture or focal lesion. Sinuses/Orbits: Trace opacification of left mastoid tip. Bilateral maxillary sinus opacification. Otherwise normally aerated visualized paranasal sinuses. Orbits are unremarkable. High-riding right jugular bulb with thin sigmoid plate. Other: None. IMPRESSION: 1. No acute intracranial abnormality identified. 2. Stable chronic right anterior perisylvian infarction. 3. Stable mild chronic microvascular ischemic changes and mild parenchymal volume loss of the brain. Electronically Signed   By: Kristine Garbe M.D.   On: 05/24/2016 22:55   Ct Head Wo Contrast  Result Date: 05/13/2016 CLINICAL DATA:  Throat cancer with multiple falls. EXAM: CT HEAD WITHOUT CONTRAST TECHNIQUE: Contiguous axial images were obtained from the base of the skull through the vertex without intravenous contrast. COMPARISON:  10/21/2015 CT head FINDINGS: Brain: Superficial and central atrophy is again noted with chronic small vessel ischemic disease of periventricular white matter. Encephalomalacia with ex vacuo dilatation of the anterior horn of the right lateral ventricle secondary to remote right temporoparietal infarct remains unchanged. No new large vascular territory infarction, intra-axial mass nor extra-axial fluid collections  are noted. Vascular: Atherosclerosis of the vertebral arteries and carotid siphons bilaterally. No hyperdense vessels. Skull: Normal. Negative for fracture or focal lesion. Sinuses/Orbits: No acute finding. Other: None. IMPRESSION:  Chronic cerebral atrophy with remote right temporoparietal infarct and ex vacuo dilatation of the adjacent right lateral ventricle. No acute intracranial abnormality. Chronic small vessel ischemic disease of periventricular white matter. Electronically Signed   By: Ashley Royalty M.D.   On: 05/13/2016 19:35   Dg Op Swallowing Func-medicare/speech Path  Result Date: 04/29/2016 CLINICAL DATA:  Trouble swallowing for the past 6 months. History of laryngeal cancer. EXAM: MODIFIED BARIUM SWALLOW TECHNIQUE: Different consistencies of barium were administered orally to the patient by the Speech Pathologist. Imaging of the pharynx was performed in the lateral projection. FLUOROSCOPY TIME:  Fluoroscopy Time:  1 minutes 18 seconds Radiation Exposure Index (if provided by the fluoroscopic device): 0.5 mGy Number of Acquired Spot Images: 0 COMPARISON:  None. FINDINGS: Thin liquid- Mild vallecular residuals. Laryngeal flash penetration without aspiration. Otherwise within normal limits Nectar thick liquid- Mild vallecular residuals. Otherwise within normal limits Pure- Difficulty initiating swallow. Vallecular residuals. Otherwise within normal limits Pure with cracker- Difficulty initiating swallow. Vallecular residuals. Otherwise within normal limits IMPRESSION: Modified barium swallow as described above. Please refer to the Speech Pathologists report for complete details and recommendations. Electronically Signed   By: Kathreen Devoid   On: 04/29/2016 13:43   Dg Chest Port 1 View  Result Date: 05/25/2016 CLINICAL DATA:  Acute onset of syncope.  Initial encounter. EXAM: PORTABLE CHEST 1 VIEW COMPARISON:  Chest radiograph performed 09/04/2012 FINDINGS: The lungs are well-aerated and clear. There  is no evidence of focal opacification, pleural effusion or pneumothorax. The cardiomediastinal silhouette is within normal limits. No acute osseous abnormalities are seen. IMPRESSION: No acute cardiopulmonary process seen. Electronically Signed   By: Garald Balding M.D.   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 His final XRT was approximately May 19, 2016. No further interventions are needed at this time. Patient has been instructed to keep his appointments for his previously scheduled PET scan in follow-up in the [place] in approximately 6-7 weeks.  2. Dehydration/hypotension: Improved. Secondary to poor PO intake. Continue IV fluids as needed. 3. Dementia with behavioral disturbance: Appreciate psychiatry input.  4. Renal insufficiency: Patient's creatinine is slightly above his baseline likely secondary dehydration. IV fluids as above.  Appreciate consult, call with questions. I will be out of town starting Tuesday, May 26, 2016. Please call the oncologist on-call if there are any questions or concerns.   Lloyd Huger, MD   05/25/2016 10:56 PM

## 2016-05-25 NOTE — Clinical Social Work Note (Addendum)
Clinical Social Work Assessment  Patient Details  Name: [name] MRN: [id number] Date of Birth: 02/13/1946  Date of referral:  05/25/16               Reason for consult:  Other (Comment Required) (From [facility] )                Permission sought to share information with:  Facility Art therapist granted to share information::  Yes, Verbal Permission Granted  Name::        Agency::     Relationship::     Contact Information:     Housing/Transportation Living arrangements for the past 2 months:  Group Home Source of Information:  Patient, Guardian Patient Interpreter Needed:  None Criminal Activity/Legal Involvement Pertinent to Current Situation/Hospitalization:  No - Comment as needed Significant Relationships:  Warehouse manager Lives with:  Facility Resident Do you feel safe going back to the place where you live?  Yes Need for family participation in patient care:  Yes (Comment)  Care giving concerns:  Patient is a long term care resident at [facility] in [place] (fax:[phone]).    Social Worker assessment / plan:  Holiday representative (CSW) received consult that patient is from a group home. CSW met with patient alone at bedside to discuss D/C plan. PT is recommending home health. Patient reported that he has been at the group home for 6 months and is agreeable to return there. CSW contacted patient's DSS guardian [name] [phone]. Per guardian patient will return to the group home when stable. Per guardian group home [name] is [name] [phone]. CSW contacted group home [name] and left him a voicemail. FL2 complete. CSW will continue to follow and assist as needed.    [name] called CSW back and reported that patient can return to [facility] care home when stable and is open to [facility] home health.   Employment status:  Retired Forensic scientist:  Information systems manager, [employer] [place] [employer]  Recommendations:  Home with [place] / Referral to community resources:  Other (Comment Required) (Patient will return to group home with home health )  Patient/Family's Response to care:  Per guardian patient will return to group home when stable.   Patient/Family's Understanding of and Emotional Response to Diagnosis, Current Treatment, and Prognosis: Patient and guardian were very pleasant and thanked CSW for assistance.   Emotional Assessment Appearance:  Appears stated age Attitude/Demeanor/Rapport:    Affect (typically observed):  Accepting, Adaptable, Pleasant Orientation:  Oriented to Self, Oriented to Place, Oriented to  Time, Fluctuating Orientation (Suspected and/or reported Sundowners) Alcohol / Substance use:  Not Applicable Psych involvement (Current and /or in the community):  No (Comment)  Discharge Needs  Concerns to be addressed:  Discharge Planning Concerns Readmission within the last 30 days:  No Current discharge risk:  Chronically ill Barriers to Discharge:  Continued Medical Work up   [employer], Veronia Beets, LCSW 05/25/2016, 4:30 PM

## 2016-05-25 NOTE — NC FL2 (Signed)
[place] LEVEL OF CARE SCREENING TOOL     IDENTIFICATION  Patient Name: [name] Birthdate: 1945-06-22 Sex: male Admission Date (Current Location): 05/24/2016  [facility] and [place] Number:  Selena Lesser  ([phone] [id number]) Facility and Address:  [facility], [address] [place], [place] [address]      Provider Number: [id number]  Attending Physician Name and Address:  Bettey Costa, MD  Relative Name and Phone Number:       Current Level of Care: Hospital Recommended Level of Care: [facility] Prior Approval Number:    Date Approved/Denied:   PASRR Number:  ([id number] )  Discharge Plan: Domiciliary (Rest home)    Current Diagnoses: Patient Active Problem List   Diagnosis Date Noted  . Protein-calorie malnutrition, severe 05/14/2016  . Throat cancer ([place])   . Palliative care encounter   . AKI (acute kidney injury) ([place]) 05/13/2016  . Goals of care, counseling/discussion 03/01/2016  . Primary cancer of larynx ([place]) 02/19/2016  . Alcohol abuse 11/18/2015  . Dementia with behavioral disturbance 10/21/2015    Orientation RESPIRATION BLADDER Height & Weight     Self, Time, Place    Continent Weight: 116 lb 9 oz (52.9 kg) Height:  '5\' 7"'$  (170.2 cm)  BEHAVIORAL SYMPTOMS/MOOD NEUROLOGICAL BOWEL NUTRITION STATUS   (none)  (none) Continent Diet (Diet: Full Liquid to be advanced )  AMBULATORY STATUS COMMUNICATION OF NEEDS Skin   Limited Assist Verbally Normal                       Personal Care Assistance Level of Assistance  Bathing, Feeding, Dressing Bathing Assistance: Limited assistance Feeding assistance: Independent Dressing Assistance: Limited assistance     Functional Limitations Info  Sight, Hearing, Speech Sight Info: Adequate Hearing Info: Adequate Speech Info: Adequate    SPECIAL CARE FACTORS FREQUENCY  PT (By licensed PT)     PT Frequency:  (2-3 days per week via home health )               Contractures      Additional Factors Info  Code Status, Allergies Code Status Info:  (Full Code. ) Allergies Info:  (Penicillins)           Discharge Medications: Please see discharge summary for a list of discharge medications. Current Discharge Medication List        CONTINUE these medications which have CHANGED   Details  QUEtiapine (SEROQUEL) 50 MG tablet Take 1 tablet (50 mg total) by mouth at bedtime. Qty: 30 tablet, Refills: 0          CONTINUE these medications which have NOT CHANGED   Details  ADVAIR DISKUS 250-50 MCG/DOSE AEPB Inhale 2 puffs into the lungs daily.    alum & mag hydroxide-simeth (MAALOX/MYLANTA) 200-200-20 MG/5ML suspension Take 15 mLs by mouth as needed for indigestion or heartburn. Qty: 355 mL, Refills: 0    Cholecalciferol (VITAMIN D3) 5000 units TABS Take 5,000 Units by mouth daily.    donepezil (ARICEPT) 5 MG tablet Take 5 mg by mouth at bedtime.     folic acid (FOLVITE) 1 MG tablet Take 1 mg by mouth daily.    furosemide (LASIX) 20 MG tablet Take 20 mg by mouth daily.     lactose free nutrition (BOOST PLUS) LIQD Take 237 mLs by mouth 4 (four) times daily.    magic mouthwash w/lidocaine SOLN Take 5 mLs by mouth 3 (three) times daily as needed for mouth pain. Qty:  300 mL, Refills: 0   Associated Diagnoses: Primary cancer of larynx (HCC)    magnesium oxide (MAG-OX) 400 MG tablet Take 800 mg by mouth 3 (three) times daily.     morphine 10 MG/5ML solution Take 2.6 mLs by mouth every 4 (four) hours as needed for pain. Take 2.6 mLs (52 MG) by mouth every 4 (four) hours as needed for pain.    ondansetron (ZOFRAN) 8 MG tablet Take 1 tablet (8 mg total) by mouth 2 (two) times daily as needed. Qty: 30 tablet, Refills: 1   Associated Diagnoses: Primary cancer of larynx (HCC)    polyethylene glycol (MIRALAX / GLYCOLAX) packet Take 17 g by mouth 2 (two) times daily.    Associated Diagnoses: Primary cancer of larynx (HCC)     prochlorperazine (COMPAZINE) 10 MG tablet Take 1 tablet (10 mg total) by mouth every 6 (six) hours as needed (Nausea or vomiting). Qty: 30 tablet, Refills: 1   Associated Diagnoses: Primary cancer of larynx ([place])    SPIRIVA HANDIHALER 18 MCG inhalation capsule Place 18 mcg into inhaler and inhale daily.     sucralfate (CARAFATE) 1 g tablet Take 1 tablet (1 g total) by mouth 3 (three) times daily. Dissolve in 4 tbs warm water, swish and swallow Qty: 90 tablet, Refills: 2    tamsulosin (FLOMAX) 0.4 MG CAPS capsule Take 0.4 mg by mouth 2 (two) times daily.     thiamine 100 MG tablet Take 100 mg by mouth daily.    venlafaxine XR (EFFEXOR-XR) 150 MG 24 hr capsule Take 150 mg by mouth daily with breakfast.     vitamin B-12 (CYANOCOBALAMIN) 1000 MCG tablet Take 1,000 mcg by mouth daily.   Associated Diagnoses: Primary cancer of larynx ([place])         STOP taking these medications     meloxicam (MOBIC) 7.5 MG tablet      nadolol (CORGARD) 20 MG tablet      spironolactone (ALDACTONE) 50 MG tablet      traZODone (DESYREL) 50 MG tablet          Relevant Imaging Results:  Relevant Lab Results:   Additional Information  (SSN: [id number])   Pt will have Hospice Services at facility   Sample, Veronia Beets, [place]

## 2016-05-26 DIAGNOSIS — Z515 Encounter for palliative care: Secondary | ICD-10-CM

## 2016-05-26 DIAGNOSIS — N179 Acute kidney failure, unspecified: Secondary | ICD-10-CM

## 2016-05-26 DIAGNOSIS — R55 Syncope and collapse: Secondary | ICD-10-CM

## 2016-05-26 DIAGNOSIS — E860 Dehydration: Secondary | ICD-10-CM

## 2016-05-26 DIAGNOSIS — Z7189 Other specified counseling: Secondary | ICD-10-CM

## 2016-05-26 LAB — BASIC METABOLIC PANEL
Anion gap: 4 — ABNORMAL LOW (ref 5–15)
BUN: 12 mg/dL (ref 6–20)
CALCIUM: 8.3 mg/dL — AB (ref 8.9–10.3)
CO2: 27 mmol/L (ref 22–32)
Chloride: 98 mmol/L — ABNORMAL LOW (ref 101–111)
Creatinine, Ser: 0.88 mg/dL (ref 0.61–1.24)
GFR calc Af Amer: >60 mL/min (ref >60)
GLUCOSE: 93 mg/dL (ref 65–99)
Potassium: 4.2 mmol/L (ref 3.5–5.1)
Sodium: 129 mmol/L — ABNORMAL LOW (ref 135–145)

## 2016-05-26 LAB — HEMOGLOBIN A1C
Hgb A1c MFr Bld: 5.5 % (ref 4.8–5.6)
Mean Plasma Glucose: 111 mg/dL

## 2016-05-26 MED ORDER — QUETIAPINE FUMARATE 50 MG PO TABS: 50.0000 mg | ORAL_TABLET | Freq: Every day | ORAL | 0 refills | Status: AC

## 2016-05-26 MED ORDER — QUETIAPINE FUMARATE 50 MG PO TABS: 50.0000 mg | ORAL_TABLET | Freq: Every day | ORAL | 0 refills | Status: DC

## 2016-05-26 NOTE — Clinical Social Work Note (Signed)
Pt is ready for discharge today and will return to facility with Hospice services. Facility is ready to accept pt. Discharge packet has been prepared. Pt's sister will provide transportation to the facility. CSW left a voicemail for pt's guardian, [name] with [facility] to address discharge. CSW is signing off as no further needs identified.  [name], MSW, LCSW  Clinical Social Worker  [phone]

## 2016-05-26 NOTE — Progress Notes (Signed)
New referral for [facility] services at [facility] following discharge received from CMRN Brenda Holland following a  Palliative Medicine consult. [name] is a 71 year old man with a past medical history of stage 4 squamous cell cancer of the right larynx, cirrhosis, alcoholism, tobacco abuse, COPD and dementia  who was admitted on 05/24/2016 with hypotension, dehydration and near syncope.  He had a recent admission at the end of March with similar symptoms. He had a MOST form completed at his last admission which Palliative Medicine reviewed with his DSS guardian Angela Riley who confirmed DNR, no feeding tube, IV fluids, antibiotics to be determined as needed. Hospice services were recommended and Angela agreed. °Write met briefly in the room with [name] prior to his discharge, his sister Patricia was present, education was initiated regarding hospice services, philosophy and team approach to care with some understanding voiced. patient was anxious to leave, his sister was providing transportation to [facility]. Updated information faxed to referral. Of note patient  Had a pending HOME palliative referral, but he was rehospitalized prior to consult being completed. Thank you. °Karen Robertson RN, BSN, CHPN °[facility], hospital Liaison °[phone] c °

## 2016-05-26 NOTE — Care Management (Signed)
Received telephone call back from [place]. [place].  Flo Shanks RN representative for [facility] updated. Shelbie Ammons RN MSN CCM Care Management

## 2016-05-26 NOTE — Discharge Summary (Addendum)
[facility] NAME: [name]    MR#:  [id number]  DATE OF BIRTH:  Feb 01, 1946  DATE OF ADMISSION:  05/24/2016 ADMITTING PHYSICIAN: Harrie Foreman, MD  DATE OF DISCHARGE: 05/26/2016  PRIMARY CARE PHYSICIAN: Remi Haggard, FNP    ADMISSION DIAGNOSIS:  Dehydration [E86.0] Hyponatremia [E87.1] Weakness [R53.1] Orthostasis [I95.1] Renal insufficiency [N28.9] Near syncope [R55]  DISCHARGE DIAGNOSIS:  Principal Problem:   Dementia with behavioral disturbance Active Problems:   AKI (acute kidney injury) ([place])   Dehydration   Renal insufficiency   SECONDARY DIAGNOSIS:   Past Medical History:  Diagnosis Date  . Alcohol abuse   . Cirrhosis ([place])   . COPD (chronic obstructive pulmonary disease) ([place])   . Hepatitis C   . Malnutrition ([place])   . 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  1.AKI due to poor by mouth intake and dehydration: Patient was treated with IVF. Creatinine has improved.  2. Near syncope due to orthostatic hypotension.  3. Liver cirrhosis: Due to poor by mouth intake and acute kidney injury and low blood pressure Aldactone and nadolol have been discontinued. These may be restarted as outpatient once blood pressure improves He will continue low-dose Lasix  4. Dementia with behavioral disturbance: Psychiatry was consulted for medical management. Due to recent falls from malnutrition and dehydration trazodone was discontinued. Seroquel was decreased from 150 mg and he will continue Effexor.  5. Stage IV squamous cell carcinoma the right larynx: Oncology was consulted. Patient has completed chemotherapy and radiation. No further intervention at this time. Patient will follow-up with Dr. Grayland Ormond for PET scan in 6-7 weeks. He was evaluated by speech and is considered mild  aspiration risks. He is currently on a dysphagia 2 diet. He will need speech follow-up as an outpatient.  Patient will need hospice services at group home    DISCHARGE CONDITIONS AND DIET:  Stable DYSPHAGIA 2 diet with thin liquids  CONSULTS OBTAINED:  Treatment Team:  Lloyd Huger, MD Gonzella Lex, MD  DRUG ALLERGIES:   Allergies  Allergen Reactions  . Penicillins Other (See Comments)    Reaction: unknown Patient is not able to answer follow up questions    DISCHARGE MEDICATIONS:   Current Discharge Medication List    CONTINUE these medications which have CHANGED   Details  QUEtiapine (SEROQUEL) 50 MG tablet Take 1 tablet (50 mg total) by mouth at bedtime. Qty: 30 tablet, Refills: 0      CONTINUE these medications which have NOT CHANGED   Details  ADVAIR DISKUS 250-50 MCG/DOSE AEPB Inhale 2 puffs into the lungs daily.    alum & mag hydroxide-simeth (MAALOX/MYLANTA) 200-200-20 MG/5ML suspension Take 15 mLs by mouth as needed for indigestion or heartburn. Qty: 355 mL, Refills: 0    Cholecalciferol (VITAMIN D3) 5000 units TABS Take 5,000 Units by mouth daily.    donepezil (ARICEPT) 5 MG tablet Take 5 mg by mouth at bedtime.     folic acid (FOLVITE) 1 MG tablet Take 1 mg by mouth daily.    furosemide (LASIX) 20 MG tablet Take 20 mg by mouth daily.     lactose free nutrition (BOOST PLUS) LIQD Take 237 mLs by mouth 4 (four) times daily.    magic mouthwash w/lidocaine SOLN Take 5 mLs by mouth 3 (three) times daily as  needed for mouth pain. Qty: 300 mL, Refills: 0   Associated Diagnoses: Primary cancer of larynx (HCC)    magnesium oxide (MAG-OX) 400 MG tablet Take 800 mg by mouth 3 (three) times daily.     morphine 10 MG/5ML solution Take 2.6 mLs by mouth every 4 (four) hours as needed for pain. Take 2.6 mLs (52 MG) by mouth every 4 (four) hours as needed for pain.    ondansetron (ZOFRAN) 8 MG tablet Take 1 tablet (8 mg total) by mouth 2 (two) times daily  as needed. Qty: 30 tablet, Refills: 1   Associated Diagnoses: Primary cancer of larynx (HCC)    polyethylene glycol (MIRALAX / GLYCOLAX) packet Take 17 g by mouth 2 (two) times daily.    Associated Diagnoses: Primary cancer of larynx (HCC)    prochlorperazine (COMPAZINE) 10 MG tablet Take 1 tablet (10 mg total) by mouth every 6 (six) hours as needed (Nausea or vomiting). Qty: 30 tablet, Refills: 1   Associated Diagnoses: Primary cancer of larynx ([place])    SPIRIVA HANDIHALER 18 MCG inhalation capsule Place 18 mcg into inhaler and inhale daily.     sucralfate (CARAFATE) 1 g tablet Take 1 tablet (1 g total) by mouth 3 (three) times daily. Dissolve in 4 tbs warm water, swish and swallow Qty: 90 tablet, Refills: 2    tamsulosin (FLOMAX) 0.4 MG CAPS capsule Take 0.4 mg by mouth 2 (two) times daily.     thiamine 100 MG tablet Take 100 mg by mouth daily.    venlafaxine XR (EFFEXOR-XR) 150 MG 24 hr capsule Take 150 mg by mouth daily with breakfast.     vitamin B-12 (CYANOCOBALAMIN) 1000 MCG tablet Take 1,000 mcg by mouth daily.   Associated Diagnoses: Primary cancer of larynx ([place])      STOP taking these medications     meloxicam (MOBIC) 7.5 MG tablet      nadolol (CORGARD) 20 MG tablet      spironolactone (ALDACTONE) 50 MG tablet      traZODone (DESYREL) 50 MG tablet           Today   CHIEF COMPLAINT:   Patient wants to be discharged. He says he is bored. He denies dizziness or chest pain.   VITAL SIGNS:  Blood pressure 140/63, pulse (!) 58, temperature 98.5 F (36.9 C), temperature source Oral, resp. rate 12, height '5\' 7"'$  (1.702 m), weight 55 kg (121 lb 3.2 oz), SpO2 98 %.   REVIEW OF SYSTEMS:  Review of Systems  Constitutional: Negative.  Negative for chills, fever and malaise/fatigue.  HENT: Negative.  Negative for ear discharge, ear pain, hearing loss, nosebleeds and sore throat.   Eyes: Negative.  Negative for blurred vision and pain.  Respiratory: Negative.   Negative for cough, hemoptysis, shortness of breath and wheezing.   Cardiovascular: Negative.  Negative for chest pain, palpitations and leg swelling.  Gastrointestinal: Negative.  Negative for abdominal pain, blood in stool, diarrhea, nausea and vomiting.  Genitourinary: Negative.  Negative for dysuria.  Musculoskeletal: Negative.  Negative for back pain.  Skin: Negative.   Neurological: Negative for dizziness, tremors, speech change, focal weakness, seizures and headaches.  Endo/Heme/Allergies: Negative.  Does not bruise/bleed easily.  Psychiatric/Behavioral: Negative.  Negative for depression, hallucinations and suicidal ideas.     PHYSICAL EXAMINATION:  GENERAL:  71 y.o.-year-old patient lying in the bed with no acute distress.  Thin frail NECK:  Supple, no jugular venous distention. No thyroid enlargement, no tenderness.  LUNGS: Normal breath  sounds bilaterally, no wheezing, rales,rhonchi  No use of accessory muscles of respiration.  CARDIOVASCULAR: S1, S2 normal. No murmurs, rubs, or gallops.  ABDOMEN: Soft, non-tender, non-distended. Bowel sounds present. No organomegaly or mass.  EXTREMITIES: No pedal edema, cyanosis, or clubbing.  PSYCHIATRIC: The patient is alert and oriented x 3.  SKIN: No obvious rash, lesion, or ulcer.   DATA REVIEW:   CBC  Recent Labs Lab 05/24/16 2147  WBC 5.5  HGB 10.7*  HCT 30.2*  PLT 257    Chemistries   Recent Labs Lab 05/24/16 2147 05/26/16 0812  NA 128* 129*  K 4.5 4.2  CL 92* 98*  CO2 31 27  GLUCOSE 109* 93  BUN 25* 12  CREATININE 1.58* 0.88  CALCIUM 9.2 8.3*  AST 51*  --   ALT 32  --   ALKPHOS 69  --   BILITOT 0.8  --     Cardiac Enzymes No results for input(s): TROPONINI in the last 168 hours.  Microbiology Results  '@MICRORSLT48'$ @  RADIOLOGY:  Ct Head Wo Contrast  Result Date: 05/24/2016 CLINICAL DATA:  71 y/o M; status post fall with head injury. History of throat cancer. EXAM: CT HEAD WITHOUT CONTRAST TECHNIQUE:  Contiguous axial images were obtained from the base of the skull through the vertex without intravenous contrast. COMPARISON:  05/13/2016 CT head. FINDINGS: Brain: No evidence of acute infarction, hemorrhage, hydrocephalus, extra-axial collection or mass lesion/mass effect. Stable right anterior perisylvian encephalomalacia and ex vacuo dilatation of frontal horn of right lateral ventricle compatible with sequelae of chronic infarction. Stable mild chronic microvascular ischemic changes of white matter and parenchymal volume loss. Vascular: Extensive calcific atherosclerosis of cavernous and paraclinoid internal carotid arteries. Skull: Normal. Negative for fracture or focal lesion. Sinuses/Orbits: Trace opacification of left mastoid tip. Bilateral maxillary sinus opacification. Otherwise normally aerated visualized paranasal sinuses. Orbits are unremarkable. High-riding right jugular bulb with thin sigmoid plate. Other: None. IMPRESSION: 1. No acute intracranial abnormality identified. 2. Stable chronic right anterior perisylvian infarction. 3. Stable mild chronic microvascular ischemic changes and mild parenchymal volume loss of the brain. Electronically Signed   By: Kristine Garbe M.D.   On: 05/24/2016 22:55   Dg Chest Port 1 View  Result Date: 05/25/2016 CLINICAL DATA:  Acute onset of syncope.  Initial encounter. EXAM: PORTABLE CHEST 1 VIEW COMPARISON:  Chest radiograph performed 09/04/2012 FINDINGS: The lungs are well-aerated and clear. There is no evidence of focal opacification, pleural effusion or pneumothorax. The cardiomediastinal silhouette is within normal limits. No acute osseous abnormalities are seen. IMPRESSION: No acute cardiopulmonary process seen. Electronically Signed   By: Garald Balding M.D.   On: 05/25/2016 00:10      Current Discharge Medication List    CONTINUE these medications which have CHANGED   Details  QUEtiapine (SEROQUEL) 50 MG tablet Take 1 tablet (50 mg total)  by mouth at bedtime. Qty: 30 tablet, Refills: 0      CONTINUE these medications which have NOT CHANGED   Details  ADVAIR DISKUS 250-50 MCG/DOSE AEPB Inhale 2 puffs into the lungs daily.    alum & mag hydroxide-simeth (MAALOX/MYLANTA) 200-200-20 MG/5ML suspension Take 15 mLs by mouth as needed for indigestion or heartburn. Qty: 355 mL, Refills: 0    Cholecalciferol (VITAMIN D3) 5000 units TABS Take 5,000 Units by mouth daily.    donepezil (ARICEPT) 5 MG tablet Take 5 mg by mouth at bedtime.     folic acid (FOLVITE) 1 MG tablet Take 1 mg by mouth daily.  furosemide (LASIX) 20 MG tablet Take 20 mg by mouth daily.     lactose free nutrition (BOOST PLUS) LIQD Take 237 mLs by mouth 4 (four) times daily.    magic mouthwash w/lidocaine SOLN Take 5 mLs by mouth 3 (three) times daily as needed for mouth pain. Qty: 300 mL, Refills: 0   Associated Diagnoses: Primary cancer of larynx (HCC)    magnesium oxide (MAG-OX) 400 MG tablet Take 800 mg by mouth 3 (three) times daily.     morphine 10 MG/5ML solution Take 2.6 mLs by mouth every 4 (four) hours as needed for pain. Take 2.6 mLs (52 MG) by mouth every 4 (four) hours as needed for pain.    ondansetron (ZOFRAN) 8 MG tablet Take 1 tablet (8 mg total) by mouth 2 (two) times daily as needed. Qty: 30 tablet, Refills: 1   Associated Diagnoses: Primary cancer of larynx (HCC)    polyethylene glycol (MIRALAX / GLYCOLAX) packet Take 17 g by mouth 2 (two) times daily.    Associated Diagnoses: Primary cancer of larynx (HCC)    prochlorperazine (COMPAZINE) 10 MG tablet Take 1 tablet (10 mg total) by mouth every 6 (six) hours as needed (Nausea or vomiting). Qty: 30 tablet, Refills: 1   Associated Diagnoses: Primary cancer of larynx ([place])    SPIRIVA HANDIHALER 18 MCG inhalation capsule Place 18 mcg into inhaler and inhale daily.     sucralfate (CARAFATE) 1 g tablet Take 1 tablet (1 g total) by mouth 3 (three) times daily. Dissolve in 4 tbs warm  water, swish and swallow Qty: 90 tablet, Refills: 2    tamsulosin (FLOMAX) 0.4 MG CAPS capsule Take 0.4 mg by mouth 2 (two) times daily.     thiamine 100 MG tablet Take 100 mg by mouth daily.    venlafaxine XR (EFFEXOR-XR) 150 MG 24 hr capsule Take 150 mg by mouth daily with breakfast.     vitamin B-12 (CYANOCOBALAMIN) 1000 MCG tablet Take 1,000 mcg by mouth daily.   Associated Diagnoses: Primary cancer of larynx ([place])      STOP taking these medications     meloxicam (MOBIC) 7.5 MG tablet      nadolol (CORGARD) 20 MG tablet      spironolactone (ALDACTONE) 50 MG tablet      traZODone (DESYREL) 50 MG tablet            Management plans discussed with the patient and he is in agreement. Stable for discharge   Patient should follow up with pcp  CODE STATUS:     Code Status Orders        Start     Ordered   05/25/16 0412  Full code  Continuous     05/25/16 0411    Code Status History    Date Active Date Inactive Code Status Order ID Comments User Context   05/13/2016  7:45 PM 05/15/2016  9:49 PM Full Code [id number]  Hillary Bow, MD ED      TOTAL TIME TAKING CARE OF THIS PATIENT: 37 minutes.    Note: This dictation was prepared with Dragon dictation along with smaller phrase technology. Any transcriptional errors that result from this process are unintentional.  Koni Kannan M.D on 05/26/2016 at 9:43 AM  Between 7am to 6pm - Pager - [phone] After 6pm go to [email or web address] - password [email or web address] [facility]  Office  [phone]  CC: Primary care physician; Remi Haggard, FNP

## 2016-05-26 NOTE — Care Management (Signed)
Telephone call to [place] Worker [phone] or [phone]). ( Left voice mail) Ms. Ovid Curd is the legal guardian for Mr. [name] and works at [employer] Will discuss Hospice agency choices. Shelbie Ammons RN MSN CCM Care Management [phone]

## 2016-05-26 NOTE — Consult Note (Signed)
Consultation Note Date: 05/26/2016   Patient Name: [name]  DOB: Sep 23, 1945  MRN: [id number]  Age / Sex: 71 y.o., male  PCP: Remi Haggard, [place] Referring Physician: Bettey Costa, MD  Reason for Consultation: Establishing goals of care and Hospice Evaluation  HPI/Patient Profile: 71 y.o. male  with past medical history of stage 4 squamous cell cancer of the right larynx, cirrhosis, alcoholism, tobacco abuse, COPD and dementia  who was admitted on 05/24/2016 with hypotension, dehydration and near syncope.  He had a recent admission at the end of March with similar symptoms.    Clinical Assessment and Goals of Care: I reviewed EPIC notes, labs and imaging.  Then I spoke with the patient's legal guardian Davonna Belling, DSS on the phone.  Levada Dy confirmed that the MOST form we completed with the patient's sister last admission was approved by the court and had been returned to the hospital.  On the MOST form the patient was designated:  DNR, No PEG tube, IV fluids and IV antibiotics were to be determined at the time of need.  We discussed that Teja seems to be entering into a pattern of becoming dehydrated and ill and returning to the hospital for rehydration.  Levada Dy and I agreed that [facility] are appropriate for Abdullahi at this point and will likely improve his quality of life.  Per [facility] has worked well with Hospice services in the past.  I met at bedside with [name].  He handed me a cup of yellow water and told me to pour it out.  After I took it, he told me it was urine. His breakfast tray was in front of him and he was not eating.  He did sip a bit of [employer].  Lovis has no complaints of pain.  He has mild discomfort swallowing.  He walked 200' with assistance during his PT evaluation yesterday.    I spoke with his attending physician who has discontinued spironolactone.   She is in agreement with sending him back to [facility] group home as a DNR with [employer].  Primary Decision Maker:  LEGAL GUARDIAN, Davonna Belling.    SUMMARY OF RECOMMENDATIONS     Return to [facility] with Hospice Services  Would discontinue the following medications as they are not related to comfort:  Donepezil  Folic Acid  Thiamine  P10  Would consider discontinuation of lasix as well as the patient has very little PO intake    Code Status/Advance Care Planning:  DNR    Symptom Management:   Continue:  Morphine for pain, shortness of breath.  Continue:  Effexor, Trazodone, Seroquel  Additional Recommendations (Limitations, Scope, Preferences):  Avoid Hospitalization  Palliative Prophylaxis:   Aspiration precautions.  Prognosis:   < 6 months stage 4 cancer of the larynx, ETOH cirrhosis, ETOH dementia, weight loss, recurrent hospitalizations, malnutrition.  Discharge Planning: Group Home with Hospice Services.      Primary Diagnoses: Present on Admission: . AKI (acute kidney injury) ([place]) .  Dementia with behavioral disturbance   I have reviewed the medical record, interviewed the patient and family, and examined the patient. The following aspects are pertinent.  Past Medical History:  Diagnosis Date  . Alcohol abuse   . Cirrhosis ([place])   . COPD (chronic obstructive pulmonary disease) ([place])   . Hepatitis C   . Malnutrition ([place])   . Throat cancer [facility])    Social History   Social History  . Marital status: Divorced    Spouse name: N/A  . Number of children: N/A  . Years of education: N/A   Social History Main Topics  . Smoking status: Current Every Day Smoker    Packs/day: 0.50    Years: 63.00    Types: Cigarettes  . Smokeless tobacco: Never Used  . Alcohol use 0.6 oz/week    1 Cans of beer per week  . Drug use: No  . Sexual activity: Not Asked   Other Topics Concern  . None   Social History Narrative  . None    Family History  Problem Relation Age of Onset  . Breast cancer Mother    Scheduled Meds: . cholecalciferol  5,000 Units Oral Daily  . docusate sodium  100 mg Oral BID  . donepezil  5 mg Oral QHS  . enoxaparin (LOVENOX) injection  40 mg Subcutaneous Q24H  . feeding supplement  237 mL Oral QID  . folic acid  1 mg Oral Daily  . furosemide  20 mg Oral Daily  . magnesium oxide  800 mg Oral TID  . mometasone-formoterol  2 puff Inhalation BID  . nadolol  10 mg Oral Daily  . polyethylene glycol  17 g Oral BID  . QUEtiapine  50 mg Oral QHS  . spironolactone  50 mg Oral Daily  . sucralfate  1 g Oral TID  . tamsulosin  0.4 mg Oral BID  . thiamine  100 mg Oral Daily  . tiotropium  18 mcg Inhalation Daily  . venlafaxine XR  150 mg Oral Q breakfast  . vitamin B-12  1,000 mcg Oral Daily   Continuous Infusions: PRN Meds:.acetaminophen **OR** acetaminophen, alum & mag hydroxide-simeth, HYDROmorphone (DILAUDID) injection, magic mouthwash **AND** lidocaine, morphine, ondansetron **OR** ondansetron (ZOFRAN) IV, prochlorperazine Allergies  Allergen Reactions  . Penicillins Other (See Comments)    Reaction: unknown Patient is not able to answer follow up questions   Review of Systems Patient confused, but denies complaints or discomfort  Physical Exam  Awake, alert, chronically ill appearing male. CV rrr Resp NAD Abdomen cachectically thin Ext no edema.  Vital Signs: BP 140/63 (BP Location: Left Arm)   Pulse (!) 58   Temp 98.5 F (36.9 C) (Oral)   Resp 12   Ht 5' 7"  (1.702 m)   Wt 55 kg (121 lb 3.2 oz)   SpO2 98%   BMI 18.98 kg/m  Pain Assessment: No/denies pain   Pain Score: 0-No pain   SpO2: SpO2: 98 % O2 Device:SpO2: 98 % O2 Flow Rate: .   IO: Intake/output summary:  Intake/Output Summary (Last 24 hours) at 05/26/16 1004 Last data filed at 05/26/16 0836  Gross per 24 hour  Intake          2442.08 ml  Output              300 ml  Net          2142.08 ml    LBM:  Last BM Date: 05/25/16 Baseline Weight: Weight: 52.2 kg (115 lb)  Most recent weight: Weight: 55 kg (121 lb 3.2 oz)     Palliative Assessment/Data:   Flowsheet Rows     Most Recent Value  Intake Tab  Referral Department  Hospitalist  Unit at Time of Referral  Med/Surg Unit  Palliative Care Primary Diagnosis  Cancer  Date Notified  05/25/16  Palliative Care Type  Return patient Palliative Care  Reason for referral  Clarify Goals of Care, Counsel Regarding Hospice  Date of Admission  05/24/16  Date first seen by Palliative Care  05/26/16  # of days Palliative referral response time  1 Day(s)  # of days IP prior to Palliative referral  1  Clinical Assessment  Palliative Performance Scale Score  40%  Psychosocial & Spiritual Assessment  Palliative Care Outcomes  Patient/Family meeting held?  Yes  Who was at the meeting?  Patient and guardian on the phone  Palliative Care Outcomes  Clarified goals of care, Counseled regarding hospice, Improved non-pain symptom therapy      Time In: 9:25 Time Out: 10:22 Time Total: 57 min. Greater than 50%  of this time was spent counseling and coordinating care related to the above assessment and plan.  Signed by: Imogene Burn, PA-C Palliative Medicine Pager: [phone]  Please contact Palliative Medicine Team phone at [phone] for questions and concerns.  For individual provider: See Shea Evans

## 2016-06-01 ENCOUNTER — Inpatient Hospital Stay: Payer: Medicare Other | Attending: Oncology | Admitting: Oncology

## 2016-06-01 VITALS — BP 133/74 | HR 112 | Temp 97.0°F | Resp 18 | Wt 113.7 lb

## 2016-06-01 DIAGNOSIS — Z79899 Other long term (current) drug therapy: Secondary | ICD-10-CM | POA: Insufficient documentation

## 2016-06-01 DIAGNOSIS — C321 Malignant neoplasm of supraglottis: Secondary | ICD-10-CM | POA: Insufficient documentation

## 2016-06-01 DIAGNOSIS — J449 Chronic obstructive pulmonary disease, unspecified: Secondary | ICD-10-CM | POA: Diagnosis not present

## 2016-06-01 DIAGNOSIS — F17210 Nicotine dependence, cigarettes, uncomplicated: Secondary | ICD-10-CM | POA: Diagnosis not present

## 2016-06-01 DIAGNOSIS — G4700 Insomnia, unspecified: Secondary | ICD-10-CM

## 2016-06-01 DIAGNOSIS — R1310 Dysphagia, unspecified: Secondary | ICD-10-CM | POA: Insufficient documentation

## 2016-06-01 DIAGNOSIS — K1379 Other lesions of oral mucosa: Secondary | ICD-10-CM | POA: Diagnosis not present

## 2016-06-01 DIAGNOSIS — Z9119 Patient's noncompliance with other medical treatment and regimen: Secondary | ICD-10-CM | POA: Diagnosis not present

## 2016-06-01 DIAGNOSIS — Z880 Allergy status to penicillin: Secondary | ICD-10-CM | POA: Diagnosis not present

## 2016-06-01 DIAGNOSIS — Z9181 History of falling: Secondary | ICD-10-CM | POA: Diagnosis not present

## 2016-06-01 DIAGNOSIS — B1920 Unspecified viral hepatitis C without hepatic coma: Secondary | ICD-10-CM | POA: Diagnosis not present

## 2016-06-01 DIAGNOSIS — C329 Malignant neoplasm of larynx, unspecified: Secondary | ICD-10-CM

## 2016-06-01 DIAGNOSIS — Z803 Family history of malignant neoplasm of breast: Secondary | ICD-10-CM | POA: Diagnosis not present

## 2016-06-01 DIAGNOSIS — K7030 Alcoholic cirrhosis of liver without ascites: Secondary | ICD-10-CM | POA: Diagnosis not present

## 2016-06-01 NOTE — Progress Notes (Signed)
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 He was recently discharged from the hospital for poor appetite, failure to thrive, and dehydration. This has now resolved and he feels back to his baseline.  He continues to smoke heavily. He continues to have difficulty swallowing solid food. He has no neurologic complaints. He denies numbness and tingling in hands and feet. He denies any recent fevers. He has a fair appetite. He has no chest pain or shortness of breath. He denies any nausea, vomiting, constipation, or diarrhea. He has no urinary complaints. Patient offers no further specific complaints.  REVIEW OF SYSTEMS:    Review of Systems  Constitutional: Negative.  Negative for fever, malaise/fatigue and weight loss.  HENT: Positive for sore throat.   Respiratory: Negative.  Negative for cough and shortness of breath.   Cardiovascular: Negative.  Negative for chest pain and leg swelling.  Gastrointestinal: Negative.  Negative for abdominal pain.  Genitourinary: Negative.   Musculoskeletal: Negative.   Neurological: Negative.  Negative for sensory change and weakness.  Psychiatric/Behavioral: Positive for substance abuse. The patient has insomnia.     As per HPI. Otherwise, a complete review of systems is negative.  PAST MEDICAL HISTORY: Past Medical History:  Diagnosis Date  . Alcohol abuse   . Cirrhosis ([place])   . COPD (chronic obstructive pulmonary disease) ([place])   . Hepatitis C   . Malnutrition ([place])   . Throat cancer ([place])     PAST SURGICAL HISTORY: Past Surgical History:  Procedure Laterality Date  . TONSILLECTOMY     age was 35  . VASECTOMY      FAMILY HISTORY: Family History  Problem Relation Age of Onset  . Breast cancer Mother     ADVANCED DIRECTIVES (Y/N):  N  HEALTH MAINTENANCE: Social History  Substance Use Topics  . Smoking status: Current Every Day Smoker    Packs/day: 0.50    Years: 63.00    Types: Cigarettes  . Smokeless tobacco: Never Used  . Alcohol use 0.6 oz/week    1 Cans of beer per week     Colonoscopy:  PAP:  Bone density:  Lipid panel:  Allergies  Allergen Reactions  . Penicillins Other (See Comments)    Reaction: unknown Patient is not able to answer follow up questions    Current Outpatient Prescriptions  Medication Sig Dispense Refill  . ADVAIR DISKUS 250-50 MCG/DOSE AEPB Inhale 2 puffs into the lungs daily.    Marland Kitchen alum & mag hydroxide-simeth (MAALOX/MYLANTA) 200-200-20 MG/5ML suspension Take 15 mLs by mouth as needed for indigestion or heartburn. 355 mL 0  . Cholecalciferol (VITAMIN D3) 5000 units TABS Take 5,000 Units by mouth daily.    Marland Kitchen donepezil (ARICEPT) 5 MG tablet Take 5 mg by mouth at bedtime.     . folic acid (FOLVITE) 1 MG tablet Take 1 mg by mouth daily.    . furosemide (LASIX) 20 MG tablet Take 20 mg by mouth daily.     Marland Kitchen lactose free nutrition (BOOST PLUS) LIQD Take 237 mLs by mouth 4 (four) times daily.    . magic mouthwash w/lidocaine SOLN Take 5 mLs by mouth 3 (three) times daily as needed for mouth  pain. 300 mL 0  . magnesium oxide (MAG-OX) 400 MG tablet Take 800 mg by mouth 3 (three) times daily.     Marland Kitchen morphine 10 MG/5ML solution Take 2.6 mLs by mouth every 4 (four) hours as needed for pain. Take 2.6 mLs (52 MG) by mouth every 4 (four) hours as needed for pain.    Marland Kitchen ondansetron (ZOFRAN) 8 MG tablet Take 1 tablet (8 mg total) by mouth 2 (two) times daily as needed. (Patient taking differently: Take 8 mg by mouth 2 (two) times daily as needed for nausea or vomiting. ) 30 tablet 1  . polyethylene glycol (MIRALAX / GLYCOLAX) packet Take 17 g by  mouth 2 (two) times daily.     . prochlorperazine (COMPAZINE) 10 MG tablet Take 1 tablet (10 mg total) by mouth every 6 (six) hours as needed (Nausea or vomiting). 30 tablet 1  . QUEtiapine (SEROQUEL) 50 MG tablet Take 1 tablet (50 mg total) by mouth at bedtime. 30 tablet 0  . SPIRIVA HANDIHALER 18 MCG inhalation capsule Place 18 mcg into inhaler and inhale daily.     . sucralfate (CARAFATE) 1 g tablet Take 1 tablet (1 g total) by mouth 3 (three) times daily. Dissolve in 4 tbs warm water, swish and swallow 90 tablet 2  . tamsulosin (FLOMAX) 0.4 MG CAPS capsule Take 0.4 mg by mouth 2 (two) times daily.     Marland Kitchen thiamine 100 MG tablet Take 100 mg by mouth daily.    Marland Kitchen venlafaxine XR (EFFEXOR-XR) 150 MG 24 hr capsule Take 150 mg by mouth daily with breakfast.     . vitamin B-12 (CYANOCOBALAMIN) 1000 MCG tablet Take 1,000 mcg by mouth daily.     No current facility-administered medications for this visit.    Facility-Administered Medications Ordered in Other Visits  Medication Dose Route Frequency Provider Last Rate Last Dose  . heparin lock flush 100 unit/mL  500 Units Intravenous Once Lloyd Huger, MD        OBJECTIVE: Vitals:   06/01/16 1557  BP: 133/74  Pulse: (!) 112  Resp: 18  Temp: 97 F (36.1 C)     Body mass index is 17.81 kg/m.    ECOG FS:0 - Asymptomatic  General: Thin, no acute distress. Eyes: Pink conjunctiva, anicteric sclera. HEENT: Normocephalic, moist mucous membranes. Ulceration noted in far left oropharynx improving. No palpable lymphadenopathy in right cervical chain. Lungs: Clear to auscultation bilaterally. Heart: Regular rate and rhythm. No rubs, murmurs, or gallops. Abdomen: Soft, nontender, nondistended. No organomegaly noted, normoactive bowel sounds. Musculoskeletal: No edema, cyanosis, or clubbing. Neuro: Alert, answering all questions appropriately. Cranial nerves grossly intact. Skin: No rashes or petechiae noted. Psych: Normal affect.   LAB  RESULTS:  Lab Results  Component Value Date   NA 129 (L) 05/26/2016   K 4.2 05/26/2016   CL 98 (L) 05/26/2016   CO2 27 05/26/2016   GLUCOSE 93 05/26/2016   BUN 12 05/26/2016   CREATININE 0.88 05/26/2016   CALCIUM 8.3 (L) 05/26/2016   PROT 6.7 05/24/2016   ALBUMIN 3.2 (L) 05/24/2016   AST 51 (H) 05/24/2016   ALT 32 05/24/2016   ALKPHOS 69 05/24/2016   BILITOT 0.8 05/24/2016   GFRNONAA >60 05/26/2016   GFRAA >60 05/26/2016    Lab Results  Component Value Date   WBC 5.5 05/24/2016   NEUTROABS 3.1 05/13/2016   HGB 10.7 (L) 05/24/2016   HCT 30.2 (L) 05/24/2016   MCV 97.3 05/24/2016   PLT 257 05/24/2016  STUDIES: Ct Head Wo Contrast  Result Date: 05/24/2016 CLINICAL DATA:  71 y/o M; status post fall with head injury. History of throat cancer. EXAM: CT HEAD WITHOUT CONTRAST TECHNIQUE: Contiguous axial images were obtained from the base of the skull through the vertex without intravenous contrast. COMPARISON:  05/13/2016 CT head. FINDINGS: Brain: No evidence of acute infarction, hemorrhage, hydrocephalus, extra-axial collection or mass lesion/mass effect. Stable right anterior perisylvian encephalomalacia and ex vacuo dilatation of frontal horn of right lateral ventricle compatible with sequelae of chronic infarction. Stable mild chronic microvascular ischemic changes of white matter and parenchymal volume loss. Vascular: Extensive calcific atherosclerosis of cavernous and paraclinoid internal carotid arteries. Skull: Normal. Negative for fracture or focal lesion. Sinuses/Orbits: Trace opacification of left mastoid tip. Bilateral maxillary sinus opacification. Otherwise normally aerated visualized paranasal sinuses. Orbits are unremarkable. High-riding right jugular bulb with thin sigmoid plate. Other: None. IMPRESSION: 1. No acute intracranial abnormality identified. 2. Stable chronic right anterior perisylvian infarction. 3. Stable mild chronic microvascular ischemic changes and mild  parenchymal volume loss of the brain. Electronically Signed   By: Kristine Garbe M.D.   On: 05/24/2016 22:55   Ct Head Wo Contrast  Result Date: 05/13/2016 CLINICAL DATA:  Throat cancer with multiple falls. EXAM: CT HEAD WITHOUT CONTRAST TECHNIQUE: Contiguous axial images were obtained from the base of the skull through the vertex without intravenous contrast. COMPARISON:  10/21/2015 CT head FINDINGS: Brain: Superficial and central atrophy is again noted with chronic small vessel ischemic disease of periventricular white matter. Encephalomalacia with ex vacuo dilatation of the anterior horn of the right lateral ventricle secondary to remote right temporoparietal infarct remains unchanged. No new large vascular territory infarction, intra-axial mass nor extra-axial fluid collections are noted. Vascular: Atherosclerosis of the vertebral arteries and carotid siphons bilaterally. No hyperdense vessels. Skull: Normal. Negative for fracture or focal lesion. Sinuses/Orbits: No acute finding. Other: None. 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 No acute intracranial abnormality. Chronic small vessel ischemic disease of periventricular white matter. Electronically Signed   By: Ashley Royalty M.D.   On: 05/13/2016 19:35   Dg Chest Port 1 View  Result Date: 05/25/2016 CLINICAL DATA:  Acute onset of syncope.  Initial encounter. EXAM: PORTABLE CHEST 1 VIEW COMPARISON:  Chest radiograph performed 09/04/2012 FINDINGS: The lungs are well-aerated and clear. There is no evidence of focal opacification, pleural effusion or pneumothorax. The cardiomediastinal silhouette is within normal limits. No acute osseous abnormalities are seen. IMPRESSION: No acute cardiopulmonary process seen. Electronically Signed   By: Garald Balding M.D.   On: 05/25/2016 00:10    ASSESSMENT:  Clinical stage IVa squamous cell carcinoma of  the right larynx.  PLAN:    1. 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 Given patient's history of noncompliance, we did not pursue induction chemotherapy and proceeded with concurrent radiation therapy and weekly chemotherapy using cisplatin. He completed treatment on May 19, 2016. Return to clinic as previously scheduled with restaging PET scan and further evaluation.  2. Social situation: Because patient is a ward of the state, he will require his power of attorney to be present for any further consents. 3. Smoking cessation: Although he acknowledges the risks, patient has declined to discontinue tobacco at this time. 4. Dysphagia/mouth sores: Improving. Continue Magic mouthwash and Carafate as indicated. Had Barium Swallow on March 13th. Follow speech pathology recommendations.  5. Insomnia: Although the patient reports he does not sleep, his caretaker states he sleeps adequately. 6. Increasing creatinine: Cisplatin was discontinued. Patient's creatinine is now within normal limits. 7. Dehydration: Resolved. Continue to follow speech pathology recommendations.  Patient expressed understanding and was in agreement with this plan. He also understands that He can call clinic at any time with any questions, concerns, or complaints.    Cancer Staging Primary cancer of larynx [facility]) Staging form: Larynx - Supraglottis, AJCC 8th Edition - Clinical stage from 02/27/2016: Stage IVA (cT1, cN2b, cM0) - Signed by Lloyd Huger, MD on 02/27/2016   Lloyd Huger, MD 06/07/16 8:24 AM

## 2016-06-01 NOTE — Progress Notes (Signed)
States has difficulty swallowing solid foods. Caregiver states that pt saw PCP today who recommended speech eval for swallow study.

## 2016-07-09 ENCOUNTER — Encounter
Admission: RE | Admit: 2016-07-09 | Discharge: 2016-07-09 | Disposition: A | Discharge status: Home / Self Care | Payer: Medicare Other | Source: Ambulatory Visit | Attending: Oncology | Admitting: Oncology

## 2016-07-09 DIAGNOSIS — C329 Malignant neoplasm of larynx, unspecified: Secondary | ICD-10-CM | POA: Insufficient documentation

## 2016-07-09 LAB — GLUCOSE, CAPILLARY: Glucose-Capillary: 107 mg/dL — ABNORMAL HIGH (ref 65–99)

## 2016-07-09 MED ORDER — FLUDEOXYGLUCOSE F - 18 (FDG) INJECTION
12.0000 | Freq: Once | INTRAVENOUS | Status: AC | PRN
  Administered 2016-07-09: 13.117 via INTRAVENOUS

## 2016-07-13 NOTE — Progress Notes (Deleted)
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 He was recently discharged from the hospital for poor appetite, failure to thrive, and dehydration. This has now resolved and he feels back to his baseline.  He continues to smoke heavily. He continues to have difficulty swallowing solid food. He has no neurologic complaints. He denies numbness and tingling in hands and feet. He denies any recent fevers. He has a fair appetite. He has no chest pain or shortness of breath. He denies any nausea, vomiting, constipation, or diarrhea. He has no urinary complaints. Patient offers no further specific complaints.  REVIEW OF SYSTEMS:    Review of Systems  Constitutional: Negative.  Negative for fever, malaise/fatigue and weight loss.  HENT: Positive for sore throat.   Respiratory: Negative.  Negative for cough and shortness of breath.   Cardiovascular: Negative.  Negative for chest pain and leg swelling.  Gastrointestinal: Negative.  Negative for abdominal pain.  Genitourinary: Negative.   Musculoskeletal: Negative.   Neurological: Negative.  Negative for sensory change and weakness.  Psychiatric/Behavioral: Positive for substance abuse. The patient has insomnia.     As per HPI. Otherwise, a complete review of systems is negative.  PAST MEDICAL HISTORY: Past Medical History:  Diagnosis Date  . Alcohol abuse   . Cirrhosis ([place])   . COPD (chronic obstructive pulmonary disease) ([place])   . Hepatitis C   . Malnutrition ([place])   . Throat cancer ([place])     PAST SURGICAL HISTORY: Past Surgical History:  Procedure Laterality Date  . TONSILLECTOMY     age was 64  . VASECTOMY      FAMILY HISTORY: Family History  Problem Relation Age of Onset  . Breast cancer Mother     ADVANCED DIRECTIVES (Y/N):  N  HEALTH MAINTENANCE: Social History  Substance Use Topics  . Smoking status: Current Every Day Smoker    Packs/day: 0.50    Years: 63.00    Types: Cigarettes  . Smokeless tobacco: Never Used  . Alcohol use 0.6 oz/week    1 Cans of beer per week     Colonoscopy:  PAP:  Bone density:  Lipid panel:  Allergies  Allergen Reactions  . Penicillins Other (See Comments)    Reaction: unknown Patient is not able to answer follow up questions    Current Outpatient Prescriptions  Medication Sig Dispense Refill  . ADVAIR DISKUS 250-50 MCG/DOSE AEPB Inhale 2 puffs into the lungs daily.    Marland Kitchen alum & mag hydroxide-simeth (MAALOX/MYLANTA) 200-200-20 MG/5ML suspension Take 15 mLs by mouth as needed for indigestion or heartburn. 355 mL 0  . Cholecalciferol (VITAMIN D3) 5000 units TABS Take 5,000 Units by mouth daily.    Marland Kitchen donepezil (ARICEPT) 5 MG tablet Take 5 mg by mouth at bedtime.     . folic acid (FOLVITE) 1 MG tablet Take 1 mg by mouth daily.    . furosemide (LASIX) 20 MG tablet Take 20 mg by mouth daily.     Marland Kitchen lactose free nutrition (BOOST PLUS) LIQD Take 237 mLs by mouth 4 (four) times daily.    . magic mouthwash w/lidocaine SOLN Take 5 mLs by mouth 3 (three) times daily as needed for mouth  pain. 300 mL 0  . magnesium oxide (MAG-OX) 400 MG tablet Take 800 mg by mouth 3 (three) times daily.     Marland Kitchen morphine 10 MG/5ML solution Take 2.6 mLs by mouth every 4 (four) hours as needed for pain. Take 2.6 mLs (52 MG) by mouth every 4 (four) hours as needed for pain.    Marland Kitchen ondansetron (ZOFRAN) 8 MG tablet Take 1 tablet (8 mg total) by mouth 2 (two) times daily as needed. (Patient taking differently: Take 8 mg by mouth 2 (two) times daily as needed for nausea or vomiting. ) 30 tablet 1  . polyethylene glycol (MIRALAX / GLYCOLAX) packet Take 17 g by  mouth 2 (two) times daily.     . prochlorperazine (COMPAZINE) 10 MG tablet Take 1 tablet (10 mg total) by mouth every 6 (six) hours as needed (Nausea or vomiting). 30 tablet 1  . QUEtiapine (SEROQUEL) 50 MG tablet Take 1 tablet (50 mg total) by mouth at bedtime. 30 tablet 0  . SPIRIVA HANDIHALER 18 MCG inhalation capsule Place 18 mcg into inhaler and inhale daily.     . sucralfate (CARAFATE) 1 g tablet Take 1 tablet (1 g total) by mouth 3 (three) times daily. Dissolve in 4 tbs warm water, swish and swallow 90 tablet 2  . tamsulosin (FLOMAX) 0.4 MG CAPS capsule Take 0.4 mg by mouth 2 (two) times daily.     Marland Kitchen thiamine 100 MG tablet Take 100 mg by mouth daily.    Marland Kitchen venlafaxine XR (EFFEXOR-XR) 150 MG 24 hr capsule Take 150 mg by mouth daily with breakfast.     . vitamin B-12 (CYANOCOBALAMIN) 1000 MCG tablet Take 1,000 mcg by mouth daily.     No current facility-administered medications for this visit.    Facility-Administered Medications Ordered in Other Visits  Medication Dose Route Frequency Provider Last Rate Last Dose  . heparin lock flush 100 unit/mL  500 Units Intravenous Once Grayland Ormond Kathlene November, MD        OBJECTIVE: There were no vitals filed for this visit.   There is no height or weight on file to calculate BMI.    ECOG FS:0 - Asymptomatic  General: Thin, no acute distress. Eyes: Pink conjunctiva, anicteric sclera. HEENT: Normocephalic, moist mucous membranes. Ulceration noted in far left oropharynx improving. No palpable lymphadenopathy in right cervical chain. Lungs: Clear to auscultation bilaterally. Heart: Regular rate and rhythm. No rubs, murmurs, or gallops. Abdomen: Soft, nontender, nondistended. No organomegaly noted, normoactive bowel sounds. Musculoskeletal: No edema, cyanosis, or clubbing. Neuro: Alert, answering all questions appropriately. Cranial nerves grossly intact. Skin: No rashes or petechiae noted. Psych: Normal affect.   LAB RESULTS:  Lab Results    Component Value Date   NA 129 (L) 05/26/2016   K 4.2 05/26/2016   CL 98 (L) 05/26/2016   CO2 27 05/26/2016   GLUCOSE 93 05/26/2016   BUN 12 05/26/2016   CREATININE 0.88 05/26/2016   CALCIUM 8.3 (L) 05/26/2016   PROT 6.7 05/24/2016   ALBUMIN 3.2 (L) 05/24/2016   AST 51 (H) 05/24/2016   ALT 32 05/24/2016   ALKPHOS 69 05/24/2016   BILITOT 0.8 05/24/2016   GFRNONAA >60 05/26/2016   GFRAA >60 05/26/2016    Lab Results  Component Value Date   WBC 5.5 05/24/2016   NEUTROABS 3.1 05/13/2016   HGB 10.7 (L) 05/24/2016   HCT 30.2 (L) 05/24/2016   MCV 97.3 05/24/2016   PLT 257 05/24/2016     STUDIES: Nm Pet Image Restag (  ps) Skull Base To Thigh  Result Date: 07/09/2016 CLINICAL DATA:  Subsequent treatment strategy for laryngeal carcinoma. EXAM: NUCLEAR MEDICINE PET SKULL BASE TO THIGH TECHNIQUE: 13.12 mCi F-18 FDG was injected intravenously. Full-ring PET imaging was performed from the skull base to thigh after the radiotracer. CT data was obtained and used for attenuation correction and anatomic localization. FASTING BLOOD GLUCOSE:  Value: 107 mg/dl COMPARISON:  February 27, 2016 FINDINGS: NECK The supraglottic mass seen on the previous PET-CT has significantly improved in the interval. Low level uptake remains in the region of the primary mass with a maximum SUV of4 today versus 19.4 previously. There is low level uptake in the region of the previous conglomeration of FDG avid nodes. At least some of this uptake is muscular and post treatment change. No discrete mass is no longer seen in this region on CT imaging. There are several small nodes in this region. The maximum SUV in the region of the previous conglomeration of nodes is 2.8 on today's study versus 11.6 previously. No other abnormal uptake seen within the neck. CHEST There is focal uptake in the left side of the upper esophagus without a mass identified on CT imaging. Lower level uptake is scattered throughout the esophagus more  inferiorly. I suspect this may be esophagitis or due to peristalsis. This would be an unusual appearance for metastatic disease. Paraseptal emphysematous changes in the lateral right apex on series 3, image 77 are again identified. A small nodule along the medial aspect of these changes demonstrates low level uptake today with a maximum SUV of 0.9. No discernible uptake was seen in this region previously. There is a new left lower lobe nodule measuring 7.4 mm on series 3, image 109 which demonstrates increased FDG uptake with a maximum SUV of 1.33. No other abnormal uptake within the chest. ABDOMEN/PELVIS No abnormal hypermetabolic activity within the liver, pancreas, adrenal glands, or spleen. No hypermetabolic lymph nodes in the abdomen or pelvis. SKELETON No focal hypermetabolic activity to suggest skeletal metastasis. IMPRESSION: 1. The patient's primary supraglottic primary malignancy has significantly improved. No discrete mass is seen in this region today and the maximum SUV is 4 today versus 19.4 previously. The conglomeration of level 2 nodes on the previous study have also significantly improved. No abnormally enlarged nodes are seen on CT imaging and the maximum SUV in this region is 2.8 today versus 11.6 previously. 2. There is a new left lower lobe pulmonary nodule measuring 7 mm. While the FDG uptake is very low, this nodule is concerning for an early metastatic lesion in this patient. 3. Tiny nodule adjacent to paraseptal emphysematous changes in the right apex with very low level uptake. However, the uptake has increased in the interval. Recommend attention on follow-up. 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  PLAN:    1. 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 Given patient's history of  noncompliance, we did not pursue induction chemotherapy and proceeded with concurrent radiation therapy and weekly chemotherapy using cisplatin. He completed treatment on May 19, 2016. Return to clinic as previously scheduled with restaging PET scan and further evaluation.  2. Social situation: Because patient is a ward of the state, he will require his power of attorney to be present for any further consents. 3. Smoking cessation:  Although he acknowledges the risks, patient has declined to discontinue tobacco at this time. 4. Dysphagia/mouth sores: Improving. Continue Magic mouthwash and Carafate as indicated. Had Barium Swallow on March 13th. Follow speech pathology recommendations.  5. Insomnia: Although the patient reports he does not sleep, his caretaker states he sleeps adequately. 6. Increasing creatinine: Cisplatin was discontinued. Patient's creatinine is now within normal limits. 7. Dehydration: Resolved. Continue to follow speech pathology recommendations.  Patient expressed understanding and was in agreement with this plan. He also understands that He can call clinic at any time with any questions, concerns, or complaints.    Cancer Staging Primary cancer of larynx [facility]) Staging form: Larynx - Supraglottis, AJCC 8th Edition - Clinical stage from 02/27/2016: Stage IVA (cT1, cN2b, cM0) - Signed by Lloyd Huger, MD on 02/27/2016   Lloyd Huger, MD 07/13/16 11:17 PM

## 2016-07-14 ENCOUNTER — Inpatient Hospital Stay

## 2016-07-14 ENCOUNTER — Inpatient Hospital Stay: Admitting: Oncology

## 2016-07-14 ENCOUNTER — Ambulatory Visit: Admitting: Radiation Oncology

## 2016-08-04 NOTE — Progress Notes (Signed)
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 He currently feels well and is back to his baseline. He does not complain of weakness or fatigue today. He continues to smoke heavily. He does not complain of dysphagia or difficulty swallowing food today. He has no neurologic complaints. He denies numbness and tingling in hands and feet. He denies any recent fevers. He has a fair appetite. He has no chest pain or shortness of breath. He denies any nausea, vomiting, constipation, or diarrhea. He has no urinary complaints. Patient offers no specific complaints.  REVIEW OF SYSTEMS:    Review of Systems  Constitutional: Negative.  Negative for fever, malaise/fatigue and weight loss.  HENT: Positive for sore throat.   Respiratory: Negative.  Negative for cough and shortness of breath.   Cardiovascular: Negative.  Negative for chest pain and leg swelling.  Gastrointestinal: Negative.  Negative for abdominal pain.  Genitourinary: Negative.   Musculoskeletal: Negative.   Neurological: Negative.  Negative for sensory change and weakness.  Psychiatric/Behavioral: Positive for substance abuse. The patient has insomnia.     As per HPI. Otherwise, a complete review of systems is negative.  PAST MEDICAL HISTORY: Past Medical History:  Diagnosis Date  . Alcohol abuse   . Cirrhosis ([place])   . COPD (chronic obstructive pulmonary disease) ([place])   . Hepatitis C   . Malnutrition ([place])   . Throat cancer ([place])     PAST SURGICAL HISTORY: Past Surgical History:  Procedure Laterality Date  . TONSILLECTOMY     age was 71  . VASECTOMY       FAMILY HISTORY: Family History  Problem Relation Age of Onset  . Breast cancer Mother     ADVANCED DIRECTIVES (Y/N):  N  HEALTH MAINTENANCE: Social History  Substance Use Topics  . Smoking status: Current Every Day Smoker    Packs/day: 0.50    Years: 63.00    Types: Cigarettes  . Smokeless tobacco: Never Used  . Alcohol use 0.6 oz/week    1 Cans of beer per week     Colonoscopy:  PAP:  Bone density:  Lipid panel:  Allergies  Allergen Reactions  . Penicillins Other (See Comments)    Reaction: unknown Patient is not able to answer follow up questions    Current Outpatient Prescriptions  Medication Sig Dispense Refill  . ADVAIR DISKUS 250-50 MCG/DOSE AEPB Inhale 2 puffs into the lungs daily.    Marland Kitchen alum & mag hydroxide-simeth (MAALOX/MYLANTA) 200-200-20 MG/5ML suspension Take 15 mLs by mouth as needed for indigestion or heartburn. 355 mL 0  . Cholecalciferol (VITAMIN D3) 5000 units TABS Take 5,000 Units by mouth daily.    . folic acid (FOLVITE) 1 MG tablet Take 1 mg by mouth daily.    Marland Kitchen lactose free nutrition (BOOST PLUS) LIQD Take 237 mLs by mouth 4 (four) times daily.    . magic mouthwash w/lidocaine SOLN Take 5 mLs by mouth 3 (three) times daily as needed for mouth pain. 300 mL 0  . morphine 10 MG/5ML solution Take 2.6 mLs by mouth every 4 (four) hours as needed for pain. Take 2.6 mLs (52 MG) by mouth every 4 (four) hours as  needed for pain.    Marland Kitchen ondansetron (ZOFRAN) 8 MG tablet Take 1 tablet (8 mg total) by mouth 2 (two) times daily as needed. (Patient taking differently: Take 8 mg by mouth 2 (two) times daily as needed for nausea or vomiting. ) 30 tablet 1  . polyethylene glycol (MIRALAX / GLYCOLAX) packet Take 17 g by mouth 2 (two) times daily.     . prochlorperazine (COMPAZINE) 10 MG tablet Take 1 tablet (10 mg total) by mouth every 6 (six) hours as needed (Nausea or vomiting). 30 tablet 1  . QUEtiapine (SEROQUEL) 50 MG tablet Take 1 tablet (50 mg total) by mouth  at bedtime. 30 tablet 0  . SPIRIVA HANDIHALER 18 MCG inhalation capsule Place 18 mcg into inhaler and inhale daily.     . sucralfate (CARAFATE) 1 g tablet Take 1 tablet (1 g total) by mouth 3 (three) times daily. Dissolve in 4 tbs warm water, swish and swallow 90 tablet 2  . tamsulosin (FLOMAX) 0.4 MG CAPS capsule Take 0.4 mg by mouth 2 (two) times daily.     Marland Kitchen thiamine 100 MG tablet Take 100 mg by mouth daily.    Marland Kitchen venlafaxine XR (EFFEXOR-XR) 150 MG 24 hr capsule Take 150 mg by mouth daily with breakfast.     . vitamin B-12 (CYANOCOBALAMIN) 1000 MCG tablet Take 1,000 mcg by mouth daily.    Marland Kitchen donepezil (ARICEPT) 5 MG tablet Take 5 mg by mouth at bedtime.     . furosemide (LASIX) 20 MG tablet Take 20 mg by mouth daily.     . magnesium oxide (MAG-OX) 400 MG tablet Take 800 mg by mouth 3 (three) times daily.      No current facility-administered medications for this visit.    Facility-Administered Medications Ordered in Other Visits  Medication Dose Route Frequency Provider Last Rate Last Dose  . heparin lock flush 100 unit/mL  500 Units Intravenous Once Lloyd Huger, MD        OBJECTIVE: Vitals:   08/05/16 1211  BP: 134/71  Pulse: 82  Resp: 20  Temp: (!) 96.2 F (35.7 C)     Body mass index is 19.53 kg/m.    ECOG FS:0 - Asymptomatic  General: Thin, no acute distress. Eyes: Pink conjunctiva, anicteric sclera. HEENT: Normocephalic, moist mucous membranes. Ulceration noted in far left oropharynx improving. No palpable lymphadenopathy in right cervical chain. Lungs: Clear to auscultation bilaterally. Heart: Regular rate and rhythm. No rubs, murmurs, or gallops. Abdomen: Soft, nontender, nondistended. No organomegaly noted, normoactive bowel sounds. Musculoskeletal: No edema, cyanosis, or clubbing. Neuro: Alert, answering all questions appropriately. Cranial nerves grossly intact. Skin: No rashes or petechiae noted. Psych: Normal affect.   LAB RESULTS:  Lab Results   Component Value Date   NA 134 (L) 08/05/2016   K 3.9 08/05/2016   CL 101 08/05/2016   CO2 28 08/05/2016   GLUCOSE 125 (H) 08/05/2016   BUN 21 (H) 08/05/2016   CREATININE 0.84 08/05/2016   CALCIUM 9.5 08/05/2016   PROT 7.6 08/05/2016   ALBUMIN 3.4 (L) 08/05/2016   AST 197 (H) 08/05/2016   ALT 143 (H) 08/05/2016   ALKPHOS 123 08/05/2016   BILITOT 0.7 08/05/2016   GFRNONAA >60 08/05/2016   GFRAA >60 08/05/2016    Lab Results  Component Value Date   WBC 5.3 08/05/2016   NEUTROABS 3.4 08/05/2016   HGB 11.7 (L) 08/05/2016   HCT 33.3 (L) 08/05/2016   MCV 100.8 (H) 08/05/2016   PLT 161 08/05/2016  STUDIES: Nm Pet Image Restag (ps) Skull Base To Thigh  Result Date: 07/09/2016 CLINICAL DATA:  Subsequent treatment strategy for laryngeal carcinoma. EXAM: NUCLEAR MEDICINE PET SKULL BASE TO THIGH TECHNIQUE: 13.12 mCi F-18 FDG was injected intravenously. Full-ring PET imaging was performed from the skull base to thigh after the radiotracer. CT data was obtained and used for attenuation correction and anatomic localization. FASTING BLOOD GLUCOSE:  Value: 107 mg/dl COMPARISON:  February 27, 2016 FINDINGS: NECK The supraglottic mass seen on the previous PET-CT has significantly improved in the interval. Low level uptake remains in the region of the primary mass with a maximum SUV of4 today versus 19.4 previously. There is low level uptake in the region of the previous conglomeration of FDG avid nodes. At least some of this uptake is muscular and post treatment change. No discrete mass is no longer seen in this region on CT imaging. There are several small nodes in this region. The maximum SUV in the region of the previous conglomeration of nodes is 2.8 on today's study versus 11.6 previously. No other abnormal uptake seen within the neck. CHEST There is focal uptake in the left side of the upper esophagus without a mass identified on CT imaging. Lower level uptake is scattered throughout the  esophagus more inferiorly. I suspect this may be esophagitis or due to peristalsis. This would be an unusual appearance for metastatic disease. Paraseptal emphysematous changes in the lateral right apex on series 3, image 77 are again identified. A small nodule along the medial aspect of these changes demonstrates low level uptake today with a maximum SUV of 0.9. No discernible uptake was seen in this region previously. There is a new left lower lobe nodule measuring 7.4 mm on series 3, image 109 which demonstrates increased FDG uptake with a maximum SUV of 1.33. No other abnormal uptake within the chest. ABDOMEN/PELVIS No abnormal hypermetabolic activity within the liver, pancreas, adrenal glands, or spleen. No hypermetabolic lymph nodes in the abdomen or pelvis. SKELETON No focal hypermetabolic activity to suggest skeletal metastasis. IMPRESSION: 1. The patient's primary supraglottic primary malignancy has significantly improved. No discrete mass is seen in this region today and the maximum SUV is 4 today versus 19.4 previously. The conglomeration of level 2 nodes on the previous study have also significantly improved. No abnormally enlarged nodes are seen on CT imaging and the maximum SUV in this region is 2.8 today versus 11.6 previously. 2. There is a new left lower lobe pulmonary nodule measuring 7 mm. While the FDG uptake is very low, this nodule is concerning for an early metastatic lesion in this patient. 3. Tiny nodule adjacent to paraseptal emphysematous changes in the right apex with very low level uptake. However, the uptake has increased in the interval. Recommend attention on follow-up. 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  PLAN:    1. 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 Given patient's history of noncompliance, we did not pursue induction chemotherapy and proceeded with concurrent radiation therapy and weekly chemotherapy using cisplatin. He completed treatment on May 19, 2016. Return to clinic in 3 months with repeat imaging and further evaluation.  2. Social situation: Because patient is a ward of the state, he will require  his power of attorney to be present for any further consents. 3. Smoking cessation: Although he acknowledges the risks, patient has declined to discontinue tobacco at this time. 4. Dysphagia/mouth sores: Improving. Continue Magic mouthwash and Carafate as indicated. Had Barium Swallow on March 13th. Follow speech pathology recommendations.  5. Insomnia: Although the patient reports he does not sleep, his caretaker states he sleeps adequately. 6. Increasing creatinine: Cisplatin was discontinued. Patient's creatinine is now within normal limits. 7. Pulmonary nodule: Patient noted to have a new left lower lobe pulmonary nodule measuring 7 mm. Repeat CT scan of chest as above.  Patient expressed understanding and was in agreement with this plan. He also understands that He can call clinic at any time with any questions, concerns, or complaints.    Cancer Staging Primary cancer of larynx [facility]) Staging form: Larynx - Supraglottis, AJCC 8th Edition - Clinical stage from 02/27/2016: Stage IVA (cT1, cN2b, cM0) - Signed by Lloyd Huger, MD on 02/27/2016   Lloyd Huger, MD 08/08/16 7:10 AM

## 2016-08-05 ENCOUNTER — Inpatient Hospital Stay: Attending: Oncology

## 2016-08-05 ENCOUNTER — Inpatient Hospital Stay (HOSPITAL_BASED_OUTPATIENT_CLINIC_OR_DEPARTMENT_OTHER): Admitting: Oncology

## 2016-08-05 VITALS — BP 134/71 | HR 82 | Temp 96.2°F | Resp 20 | Wt 124.7 lb

## 2016-08-05 DIAGNOSIS — J449 Chronic obstructive pulmonary disease, unspecified: Secondary | ICD-10-CM | POA: Diagnosis not present

## 2016-08-05 DIAGNOSIS — R911 Solitary pulmonary nodule: Secondary | ICD-10-CM | POA: Diagnosis not present

## 2016-08-05 DIAGNOSIS — B1920 Unspecified viral hepatitis C without hepatic coma: Secondary | ICD-10-CM | POA: Diagnosis not present

## 2016-08-05 DIAGNOSIS — C329 Malignant neoplasm of larynx, unspecified: Secondary | ICD-10-CM | POA: Diagnosis present

## 2016-08-05 DIAGNOSIS — Z79899 Other long term (current) drug therapy: Secondary | ICD-10-CM

## 2016-08-05 DIAGNOSIS — R1310 Dysphagia, unspecified: Secondary | ICD-10-CM

## 2016-08-05 DIAGNOSIS — F17210 Nicotine dependence, cigarettes, uncomplicated: Secondary | ICD-10-CM

## 2016-08-05 DIAGNOSIS — K7460 Unspecified cirrhosis of liver: Secondary | ICD-10-CM | POA: Diagnosis not present

## 2016-08-05 LAB — CBC WITH DIFFERENTIAL/PLATELET
BASOS ABS: 0 10*3/uL (ref 0–0.1)
Basophils Relative: 0 %
EOS PCT: 2 %
Eosinophils Absolute: 0.1 10*3/uL (ref 0–0.7)
HEMATOCRIT: 33.3 % — AB (ref 40.0–52.0)
HEMOGLOBIN: 11.7 g/dL — AB (ref 13.0–18.0)
LYMPHS ABS: 0.9 10*3/uL — AB (ref 1.0–3.6)
LYMPHS PCT: 18 %
MCH: 35.4 pg — AB (ref 26.0–34.0)
MCHC: 35.1 g/dL (ref 32.0–36.0)
MCV: 100.8 fL — AB (ref 80.0–100.0)
Monocytes Absolute: 0.8 10*3/uL (ref 0.2–1.0)
Monocytes Relative: 15 %
NEUTROS ABS: 3.4 10*3/uL (ref 1.4–6.5)
Neutrophils Relative %: 65 %
Platelets: 161 10*3/uL (ref 150–440)
RBC: 3.3 MIL/uL — ABNORMAL LOW (ref 4.40–5.90)
RDW: 13.3 % (ref 11.5–14.5)
WBC: 5.3 10*3/uL (ref 3.8–10.6)

## 2016-08-05 LAB — COMPREHENSIVE METABOLIC PANEL
ALBUMIN: 3.4 g/dL — AB (ref 3.5–5.0)
ALT: 143 U/L — ABNORMAL HIGH (ref 17–63)
ANION GAP: 5 (ref 5–15)
AST: 197 U/L — ABNORMAL HIGH (ref 15–41)
Alkaline Phosphatase: 123 U/L (ref 38–126)
BUN: 21 mg/dL — AB (ref 6–20)
CHLORIDE: 101 mmol/L (ref 101–111)
CO2: 28 mmol/L (ref 22–32)
Calcium: 9.5 mg/dL (ref 8.9–10.3)
Creatinine, Ser: 0.84 mg/dL (ref 0.61–1.24)
GFR calc Af Amer: >60 mL/min (ref >60)
Glucose, Bld: 125 mg/dL — ABNORMAL HIGH (ref 65–99)
POTASSIUM: 3.9 mmol/L (ref 3.5–5.1)
Sodium: 134 mmol/L — ABNORMAL LOW (ref 135–145)
Total Bilirubin: 0.7 mg/dL (ref 0.3–1.2)
Total Protein: 7.6 g/dL (ref 6.5–8.1)

## 2016-08-05 NOTE — Progress Notes (Signed)
Patient reports mouth sores, has prescription for mouth wash.

## 2016-08-11 ENCOUNTER — Ambulatory Visit: Admitting: Radiation Oncology

## 2016-08-24 ENCOUNTER — Ambulatory Visit: Payer: Medicare FFS | Admitting: Radiation Oncology

## 2016-09-09 ENCOUNTER — Ambulatory Visit
Admission: RE | Admit: 2016-09-09 | Discharge: 2016-09-09 | Disposition: A | Discharge status: Home / Self Care | Payer: Medicare FFS | Source: Ambulatory Visit | Attending: Radiation Oncology | Admitting: Radiation Oncology

## 2016-09-09 VITALS — BP 131/74 | HR 84 | Temp 97.0°F | Resp 16 | Ht 67.0 in | Wt 124.2 lb

## 2016-09-09 DIAGNOSIS — C329 Malignant neoplasm of larynx, unspecified: Secondary | ICD-10-CM

## 2016-09-09 NOTE — Progress Notes (Signed)
Patient here for follow up. States he is having intermittent pain on the left side of his mouth.

## 2016-09-09 NOTE — Progress Notes (Signed)
Radiation Oncology Follow up Note  Name: [name]   Date:   09/09/2016 MRN:  [id number] DOB: 06-25-1945    This 71 y.o. male presents to the clinic today for one-month follow-up status post concurrent chemoradiation for stage IV supraglottic squamous cell carcinoma the larynx.Marland Kitchen  REFERRING PROVIDER: Remi Haggard, FNP  HPI: Patient is a 71 year old male long-term alcohol abuse and smoker who presented with stage IV locally advanced squamous cell carcinoma supraglottic larynx. He is now out 1 month having completed concurrent chemoradiation. He is doing fairly well although does complain of some posterior left oral mouth pain. He specifically denies any areas of head and neck pain besides that or dysphagia. He has not yet been back to see his ENT doctor..  COMPLICATIONS OF TREATMENT: none  FOLLOW UP COMPLIANCE: keeps appointments   PHYSICAL EXAM:  BP 131/74 (BP Location: Left Arm, Patient Position: Sitting, Cuff Size: Small)   Pulse 84   Temp (!) 97 F (36.1 C) (Tympanic)   Resp 16   Ht 5\' 7"  (1.702 m)   Wt 124 lb 3.7 oz (56.4 kg)   BMI 19.46 kg/m  Oral cavity is clear do not see any lesions in the left posterior oropharynx. Indirect mirror examination shows upper airway clear vallecula and base of tongue within normal limits. Cords approximate well although there is laryngeal edema noted. Neck is clear without evidence of subject gastric cervical or supraclavicular adenopathy. Well-developed well-nourished patient in NAD. HEENT reveals PERLA, EOMI, discs not visualized.  Oral cavity is clear. No oral mucosal lesions are identified. Neck is clear without evidence of cervical or supraclavicular adenopathy. Lungs are clear to A&P. Cardiac examination is essentially unremarkable with regular rate and rhythm without murmur rub or thrill. Abdomen is benign with no organomegaly or masses noted. Motor sensory and DTR levels are equal and symmetric in the upper and lower extremities.  Cranial nerves II through XII are grossly intact. Proprioception is intact. No peripheral adenopathy or edema is identified. No motor or sensory levels are noted. Crude visual fields are within normal range.  RADIOLOGY RESULTS: No current films for review  PLAN: Present time he is recovering well from his concurrent chemoradiation. I see no evidence of disease at this time. He or he hasn't September a CT scan of his neck ordered as well as a chest CT. I've also asked him to make a follow-up appointment with ENT for close follow-up care. I have asked to see him back in 4-5 months for follow-up. Will anticipate reviewing his CT scans at that time. Patient family know to call sooner with any concerns.  I would like to take this opportunity to thank you for allowing me to participate in the care of your patient.Armstead Peaks., MD

## 2016-09-30 ENCOUNTER — Encounter: Payer: Self-pay | Admitting: *Deleted

## 2016-09-30 ENCOUNTER — Emergency Department: Payer: Medicare Other

## 2016-09-30 ENCOUNTER — Telehealth: Payer: Self-pay | Admitting: *Deleted

## 2016-09-30 ENCOUNTER — Emergency Department
Admission: EM | Admit: 2016-09-30 | Discharge: 2016-09-30 | Disposition: A | Discharge status: Home / Self Care | Payer: Medicare Other | Attending: Emergency Medicine | Admitting: Emergency Medicine

## 2016-09-30 DIAGNOSIS — W0110XA Fall on same level from slipping, tripping and stumbling with subsequent striking against unspecified object, initial encounter: Secondary | ICD-10-CM | POA: Insufficient documentation

## 2016-09-30 DIAGNOSIS — F17210 Nicotine dependence, cigarettes, uncomplicated: Secondary | ICD-10-CM | POA: Diagnosis not present

## 2016-09-30 DIAGNOSIS — Y999 Unspecified external cause status: Secondary | ICD-10-CM | POA: Diagnosis not present

## 2016-09-30 DIAGNOSIS — S0101XA Laceration without foreign body of scalp, initial encounter: Secondary | ICD-10-CM | POA: Diagnosis present

## 2016-09-30 DIAGNOSIS — Y92129 Unspecified place in nursing home as the place of occurrence of the external cause: Secondary | ICD-10-CM | POA: Insufficient documentation

## 2016-09-30 DIAGNOSIS — F0390 Unspecified dementia without behavioral disturbance: Secondary | ICD-10-CM | POA: Diagnosis not present

## 2016-09-30 DIAGNOSIS — J449 Chronic obstructive pulmonary disease, unspecified: Secondary | ICD-10-CM | POA: Insufficient documentation

## 2016-09-30 DIAGNOSIS — W19XXXA Unspecified fall, initial encounter: Secondary | ICD-10-CM

## 2016-09-30 DIAGNOSIS — Z79899 Other long term (current) drug therapy: Secondary | ICD-10-CM | POA: Insufficient documentation

## 2016-09-30 DIAGNOSIS — Y9389 Activity, other specified: Secondary | ICD-10-CM | POA: Insufficient documentation

## 2016-09-30 DIAGNOSIS — Z85818 Personal history of malignant neoplasm of other sites of lip, oral cavity, and pharynx: Secondary | ICD-10-CM | POA: Insufficient documentation

## 2016-09-30 MED ORDER — TETANUS-DIPHTH-ACELL PERTUSSIS 5-2.5-18.5 LF-MCG/0.5 IM SUSP
0.5000 mL | Freq: Once | INTRAMUSCULAR | Status: AC
  Administered 2016-09-30: 0.5 mL via INTRAMUSCULAR
  Filled 2016-09-30: qty 0.5

## 2016-09-30 MED ORDER — LIDOCAINE HCL (PF) 1 % IJ SOLN
INTRAMUSCULAR | Status: AC
  Filled 2016-09-30: qty 5

## 2016-09-30 NOTE — ED Triage Notes (Signed)
Pt arrives via EMS from [facility], states he fell and hit his head, arrives with laceration to the back of head, denies any LOC, denies any blood thinner use, awake and alert upon arrival, EDP at bedside

## 2016-09-30 NOTE — ED Notes (Signed)
Patient transported to CT 

## 2016-09-30 NOTE — ED Provider Notes (Signed)
[facility] Emergency Department Provider Note  ____________________________________________   None    (approximate)  I have reviewed the triage vital signs and the nursing notes.   HISTORY  Chief Complaint Fall   HPI [name] is a 71 y.o. male who is presenting to the emergency department if her fall. The patient says that he was walking outside at his group home when he tripped over a stick, falling backwards and hitting the back of his head. He denies losing consciousness. He denies any pain at this time. He does not know the date of his last tetanus shot. He was brought in by EMS who noticed a laceration to the back of his head.   Past Medical History:  Diagnosis Date  . Alcohol abuse   . Cirrhosis ([place])   . COPD (chronic obstructive pulmonary disease) ([place])   . Hepatitis C   . Malnutrition ([place])   . Throat cancer [facility])     Patient Active Problem List   Diagnosis Date Noted  . Near syncope   . Encounter for hospice care discussion   . Dehydration   . Renal insufficiency   . Protein-calorie malnutrition, severe 05/14/2016  . Throat cancer ([place])   . Palliative care encounter   . AKI (acute kidney injury) ([place]) 05/13/2016  . Goals of care, counseling/discussion 03/01/2016  . Primary cancer of larynx ([place]) 02/19/2016  . Alcohol abuse 11/18/2015  . Dementia with behavioral disturbance 10/21/2015    Past Surgical History:  Procedure Laterality Date  . TONSILLECTOMY     age was 73  . VASECTOMY      Prior to Admission medications   Medication Sig Start Date End Date Taking? Authorizing Provider  ADVAIR DISKUS 250-50 MCG/DOSE AEPB Inhale 2 puffs into the lungs daily. 08/29/15   [provider]  alum & mag hydroxide-simeth (MAALOX/MYLANTA) 200-200-20 MG/5ML suspension Take 15 mLs by mouth as needed for indigestion or heartburn. 05/15/16   Nicholes Mango, MD  Cholecalciferol (VITAMIN D3) 5000 units TABS Take 5,000 Units by mouth  daily.    [provider]  donepezil (ARICEPT) 5 MG tablet Take 5 mg by mouth at bedtime.  03/02/16   [provider]  folic acid (FOLVITE) 1 MG tablet Take 1 mg by mouth daily.    [provider]  furosemide (LASIX) 20 MG tablet Take 20 mg by mouth daily.  10/14/15   [provider]  lactose free nutrition (BOOST PLUS) LIQD Take 237 mLs by mouth 4 (four) times daily.    [provider]  magic mouthwash w/lidocaine SOLN Take 5 mLs by mouth 3 (three) times daily as needed for mouth pain. 04/16/16   Lloyd Huger, MD  magnesium oxide (MAG-OX) 400 MG tablet Take 800 mg by mouth 3 (three) times daily.     [provider]  morphine 10 MG/5ML solution Take 2.6 mLs by mouth every 4 (four) hours as needed for pain. Take 2.6 mLs (52 MG) by mouth every 4 (four) hours as needed for pain. 05/04/16   [provider]  ondansetron (ZOFRAN) 8 MG tablet Take 1 tablet (8 mg total) by mouth 2 (two) times daily as needed. Patient taking differently: Take 8 mg by mouth 2 (two) times daily as needed for nausea or vomiting.  03/18/16   Lloyd Huger, MD  polyethylene glycol (MIRALAX / GLYCOLAX) packet Take 17 g by mouth 2 (two) times daily.     [provider]  prochlorperazine (COMPAZINE)  10 MG tablet Take 1 tablet (10 mg total) by mouth every 6 (six) hours as needed (Nausea or vomiting). 03/18/16   Lloyd Huger, MD  QUEtiapine (SEROQUEL) 50 MG tablet Take 1 tablet (50 mg total) by mouth at bedtime. 05/26/16   Bettey Costa, MD  SPIRIVA HANDIHALER 18 MCG inhalation capsule Place 18 mcg into inhaler and inhale daily.  10/07/15   [provider]  sucralfate (CARAFATE) 1 g tablet Take 1 tablet (1 g total) by mouth 3 (three) times daily. Dissolve in 4 tbs warm water, swish and swallow 04/13/16   Noreene Filbert, MD  tamsulosin (FLOMAX) 0.4 MG CAPS capsule Take 0.4 mg by mouth 2 (two) times daily.  10/14/15   [provider]    thiamine 100 MG tablet Take 100 mg by mouth daily.    [provider]  venlafaxine XR (EFFEXOR-XR) 150 MG 24 hr capsule Take 150 mg by mouth daily with breakfast.  03/09/16   [provider]  vitamin B-12 (CYANOCOBALAMIN) 1000 MCG tablet Take 1,000 mcg by mouth daily.    [provider]    Allergies Penicillins  Family History  Problem Relation Age of Onset  . Breast cancer Mother     Social History Social History  Substance Use Topics  . Smoking status: Current Every Day Smoker    Packs/day: 0.50    Years: 63.00    Types: Cigarettes  . Smokeless tobacco: Never Used  . Alcohol use 0.6 oz/week    1 Cans of beer per week    Review of Systems  Constitutional: No fever/chills Eyes: No visual changes. ENT: No sore throat. Cardiovascular: Denies chest pain. Respiratory: Denies shortness of breath. Gastrointestinal: No abdominal pain.  No nausea, no vomiting.  No diarrhea.  No constipation. Genitourinary: Negative for dysuria. Musculoskeletal: Negative for back pain. Skin: Negative for rash. Neurological: Negative for headaches, focal weakness or numbness.   ____________________________________________   PHYSICAL EXAM:  VITAL SIGNS: ED Triage Vitals  Enc Vitals Group     BP 09/30/16 1856 132/68     Pulse Rate 09/30/16 1856 69     Resp 09/30/16 1856 16     Temp 09/30/16 1856 98 F (36.7 C)     Temp Source 09/30/16 1856 Oral     SpO2 09/30/16 1856 97 %     Weight 09/30/16 1854 125 lb (56.7 kg)     Height 09/30/16 1854 5\' 7"  (1.702 m)     Head Circumference --      Peak Flow --      Pain Score 09/30/16 1853 0     Pain Loc --      Pain Edu? --      Excl. in [place]? --     Constitutional: Alert and oriented. Well appearing and in no acute distress. Eyes: Conjunctivae are normal.  Head: Presented a laceration to the right occipitoparietal region without any active bleeding. The laceration is to the subcutaneous layer. There is no galea  exposed. Nose: No congestion/rhinnorhea. Mouth/Throat: Mucous membranes are moist.  Neck: No stridor.  No tenderness to palpation to the midline cervical spine. No deformity or step-off. Cardiovascular: Normal rate, regular rhythm. Grossly normal heart sounds. Respiratory: Normal respiratory effort.  No retractions. Lungs CTAB. Gastrointestinal: Soft and nontender. No distention.  Musculoskeletal: No lower extremity tenderness nor edema.  No joint effusions. Neurologic:  Normal speech and language. No gross focal neurologic deficits are appreciated. Skin:  Skin is warm, dry and intact. No rash  noted. Psychiatric: Mood and affect are normal. Speech and behavior are normal.  ____________________________________________   LABS (all labs ordered are listed, but only abnormal results are displayed)  Labs Reviewed - No data to display ____________________________________________  EKG   ____________________________________________  RADIOLOGY  CAT scan without any acute intracranial finding. ____________________________________________   PROCEDURES  Procedure(s) performed:  LACERATION REPAIR Performed by: Doran Stabler Authorized by: Doran Stabler Consent: Verbal consent obtained. Risks and benefits: risks, benefits and alternatives were discussed Consent given by: patient Patient identity confirmed: provided demographic data Prepped and Draped in normal sterile fashion Wound explored  Laceration Location: right occipitoparietal region  Laceration Length: 3cm  No Foreign Bodies seen or palpated  Anesthesia: local infiltration  Local anesthetic: lidocaine 1% without epinephrine  Anesthetic total: 3 ml  Irrigation method: syringe Amount of cleaning: standard  Skin closure: staple x4   Patient tolerance: Patient tolerated the procedure well with no immediate complications.   Procedures  Critical Care performed:    ____________________________________________   INITIAL IMPRESSION / ASSESSMENT AND PLAN / ED COURSE  Pertinent labs & imaging results that were available during my care of the patient were reviewed by me and considered in my medical decision making (see chart for details).  ----------------------------------------- 8:14 PM on 09/30/2016 -----------------------------------------  Patient tolerated stapling of the laceration well. Patient will be discharged to home. He will have the staples taken out in one week. He is understanding the plan and will comply. We also discussed not submerging the staples in a water but he may resume showering in 24 hours.      ____________________________________________   FINAL CLINICAL IMPRESSION(S) / ED DIAGNOSES  Fall. Head laceration.    NEW MEDICATIONS STARTED DURING THIS VISIT:  New Prescriptions   No medications on file     Note:  This document was prepared using Dragon voice recognition software and may include unintentional dictation errors.     Orbie Pyo, MD 09/30/16 2015

## 2016-09-30 NOTE — ED Notes (Signed)
Applied clean, sterile dressing per MD [facility]

## 2016-09-30 NOTE — ED Notes (Signed)
Reviewed d/c instructions, follow-up care, staple removal, and wound care with patient and family. Patient/family verbalized understanding.

## 2016-09-30 NOTE — Telephone Encounter (Signed)
Asking for an order for boost sent to [facility] for Boost QID. Patient is on hospice services and she will contact them

## 2016-11-06 ENCOUNTER — Telehealth: Payer: Self-pay | Admitting: *Deleted

## 2016-11-06 NOTE — Telephone Encounter (Signed)
Patient and guardian want lab/ CT and follow up with [facility] appointments cancelled. Cancelled appts

## 2016-11-06 NOTE — Telephone Encounter (Signed)
I would not do a CT scan.  But if Dr. Clemmie Krill ordered it, would further discuss with her.

## 2016-11-06 NOTE — Telephone Encounter (Signed)
Didn't know I ordered one.  May have been an old scan prior to enrolling in hospice.  Thank you.

## 2016-11-06 NOTE — Telephone Encounter (Signed)
You are the one that ordered the CT scan and he has a follow up appointment with you to discuss results next week. Please advise if anything needs to be cancelled

## 2016-11-06 NOTE — Telephone Encounter (Signed)
Amy with [facility] called regarding the CT ordered for patient. He is under Hospice care per Dr Clemmie Krill and a CT is not hospice protocol. Amy states she did not know that Dr Grayland Ormond was a provider, or she would have reached out to him sooner. They are NOT going to pay for a CT scan. Please advise

## 2016-11-09 ENCOUNTER — Ambulatory Visit: Payer: Medicaid Other

## 2016-11-09 ENCOUNTER — Inpatient Hospital Stay: Payer: Medicare FFS

## 2016-11-11 ENCOUNTER — Inpatient Hospital Stay: Payer: Medicare FFS | Admitting: Oncology

## 2016-11-12 ENCOUNTER — Emergency Department
Admission: EM | Admit: 2016-11-12 | Discharge: 2016-11-12 | Disposition: A | Discharge status: Other Acute Inpatient Hospital | Payer: Medicare Other | Attending: Emergency Medicine | Admitting: Emergency Medicine

## 2016-11-12 ENCOUNTER — Encounter: Payer: Self-pay | Admitting: Emergency Medicine

## 2016-11-12 DIAGNOSIS — F0391 Unspecified dementia with behavioral disturbance: Secondary | ICD-10-CM | POA: Diagnosis not present

## 2016-11-12 DIAGNOSIS — F17210 Nicotine dependence, cigarettes, uncomplicated: Secondary | ICD-10-CM | POA: Diagnosis not present

## 2016-11-12 DIAGNOSIS — F0281 Dementia in other diseases classified elsewhere with behavioral disturbance: Secondary | ICD-10-CM | POA: Diagnosis not present

## 2016-11-12 DIAGNOSIS — G301 Alzheimer's disease with late onset: Secondary | ICD-10-CM

## 2016-11-12 DIAGNOSIS — J449 Chronic obstructive pulmonary disease, unspecified: Secondary | ICD-10-CM | POA: Insufficient documentation

## 2016-11-12 DIAGNOSIS — Z85819 Personal history of malignant neoplasm of unspecified site of lip, oral cavity, and pharynx: Secondary | ICD-10-CM | POA: Insufficient documentation

## 2016-11-12 DIAGNOSIS — Z046 Encounter for general psychiatric examination, requested by authority: Secondary | ICD-10-CM | POA: Diagnosis present

## 2016-11-12 DIAGNOSIS — Z79899 Other long term (current) drug therapy: Secondary | ICD-10-CM | POA: Diagnosis not present

## 2016-11-12 DIAGNOSIS — F03918 Unspecified dementia, unspecified severity, with other behavioral disturbance: Secondary | ICD-10-CM | POA: Diagnosis present

## 2016-11-12 LAB — CBC
HEMATOCRIT: 33.8 % — AB (ref 40.0–52.0)
HEMOGLOBIN: 11.8 g/dL — AB (ref 13.0–18.0)
MCH: 34.6 pg — ABNORMAL HIGH (ref 26.0–34.0)
MCHC: 34.8 g/dL (ref 32.0–36.0)
MCV: 99.3 fL (ref 80.0–100.0)
Platelets: 180 10*3/uL (ref 150–440)
RBC: 3.4 MIL/uL — AB (ref 4.40–5.90)
RDW: 14.1 % (ref 11.5–14.5)
WBC: 4.6 10*3/uL (ref 3.8–10.6)

## 2016-11-12 LAB — COMPREHENSIVE METABOLIC PANEL
ALBUMIN: 3.4 g/dL — AB (ref 3.5–5.0)
ALT: 71 U/L — ABNORMAL HIGH (ref 17–63)
AST: 72 U/L — AB (ref 15–41)
Alkaline Phosphatase: 89 U/L (ref 38–126)
Anion gap: 6 (ref 5–15)
BUN: 11 mg/dL (ref 6–20)
CHLORIDE: 100 mmol/L — AB (ref 101–111)
CO2: 28 mmol/L (ref 22–32)
Calcium: 9.2 mg/dL (ref 8.9–10.3)
Creatinine, Ser: 0.9 mg/dL (ref 0.61–1.24)
GFR calc Af Amer: >60 mL/min (ref >60)
GFR calc non Af Amer: >60 mL/min (ref >60)
GLUCOSE: 80 mg/dL (ref 65–99)
POTASSIUM: 4.2 mmol/L (ref 3.5–5.1)
Sodium: 134 mmol/L — ABNORMAL LOW (ref 135–145)
Total Bilirubin: 0.7 mg/dL (ref 0.3–1.2)
Total Protein: 7.2 g/dL (ref 6.5–8.1)

## 2016-11-12 LAB — URINE DRUG SCREEN, QUALITATIVE (ARMC ONLY)
AMPHETAMINES, UR SCREEN: NOT DETECTED
BARBITURATES, UR SCREEN: NOT DETECTED
Benzodiazepine, Ur Scrn: NOT DETECTED
COCAINE METABOLITE, UR ~~LOC~~: NOT DETECTED
Cannabinoid 50 Ng, Ur ~~LOC~~: NOT DETECTED
MDMA (ECSTASY) UR SCREEN: NOT DETECTED
METHADONE SCREEN, URINE: NOT DETECTED
OPIATE, UR SCREEN: NOT DETECTED
Phencyclidine (PCP) Ur S: NOT DETECTED
Tricyclic, Ur Screen: NOT DETECTED

## 2016-11-12 LAB — ETHANOL

## 2016-11-12 NOTE — ED Provider Notes (Signed)
[facility] Emergency Department Provider Note  Time seen: 3:38 PM  I have reviewed the triage vital signs and the nursing notes.   HISTORY  Chief Complaint IVC and Psychiatric Evaluation    HPI [name] is a 71 y.o. male With a past medical history of dementia, past history of alcohol use and cirrhosis, presents to the emergency department under IVC. According to the IVC patient lives at [facility], was wandering through the facility naked when into other residents rooms and was found to be masturbating. Patient attempted to leave the facility through a window without success. Here the patient is calm and cooperative, does not know why he is here, does not recall the events of earlier today.  Past Medical History:  Diagnosis Date  . Alcohol abuse   . Cirrhosis ([place])   . COPD (chronic obstructive pulmonary disease) ([place])   . Hepatitis C   . Malnutrition ([place])   . Throat cancer [facility])     Patient Active Problem List   Diagnosis Date Noted  . Near syncope   . Encounter for hospice care discussion   . Dehydration   . Renal insufficiency   . Protein-calorie malnutrition, severe 05/14/2016  . Throat cancer ([place])   . Palliative care encounter   . AKI (acute kidney injury) ([place]) 05/13/2016  . Goals of care, counseling/discussion 03/01/2016  . 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 Alcohol abuse 11/18/2015  . Dementia with behavioral disturbance 10/21/2015    Past Surgical History:  Procedure Laterality Date  . TONSILLECTOMY     age was 33  . VASECTOMY      Prior to Admission medications   Medication Sig Start Date End Date Taking? Authorizing Provider  ADVAIR DISKUS 250-50 MCG/DOSE AEPB Inhale 2 puffs into the lungs daily. 08/29/15   [provider]  alum & mag hydroxide-simeth (MAALOX/MYLANTA) 200-200-20 MG/5ML suspension Take 15 mLs by mouth as needed for indigestion or heartburn. 05/15/16   Nicholes Mango, MD  Cholecalciferol  (VITAMIN D3) 5000 units TABS Take 5,000 Units by mouth daily.    [provider]  donepezil (ARICEPT) 5 MG tablet Take 5 mg by mouth at bedtime.  03/02/16   [provider]  folic acid (FOLVITE) 1 MG tablet Take 1 mg by mouth daily.    [provider]  furosemide (LASIX) 20 MG tablet Take 20 mg by mouth daily.  10/14/15   [provider]  lactose free nutrition (BOOST PLUS) LIQD Take 237 mLs by mouth 4 (four) times daily.    [provider]  magic mouthwash w/lidocaine SOLN Take 5 mLs by mouth 3 (three) times daily as needed for mouth pain. 04/16/16   Lloyd Huger, MD  magnesium oxide (MAG-OX) 400 MG tablet Take 800 mg by mouth 3 (three) times daily.     [provider]  morphine 10 MG/5ML solution Take 2.6 mLs by mouth every 4 (four) hours as needed for pain. Take 2.6 mLs (52 MG) by mouth every 4 (four) hours as needed for pain. 05/04/16   [provider]  ondansetron (ZOFRAN) 8 MG tablet Take 1 tablet (8 mg total) by mouth 2 (two) times daily as needed. Patient taking differently: Take 8 mg by mouth 2 (two) times daily as needed for nausea or vomiting.  03/18/16   Lloyd Huger, MD  polyethylene glycol (MIRALAX / GLYCOLAX) packet Take 17 g by mouth 2 (two) times daily.     [provider]  prochlorperazine (COMPAZINE) 10 MG tablet Take 1 tablet (10 mg total) by mouth every 6 (six) hours as needed (Nausea or vomiting). 03/18/16   Lloyd Huger, MD  QUEtiapine (SEROQUEL) 50 MG tablet Take 1 tablet (50 mg total) by mouth at bedtime. 05/26/16   Bettey Costa, MD  SPIRIVA HANDIHALER 18 MCG inhalation capsule Place 18 mcg into inhaler and inhale daily.  10/07/15   [provider]  sucralfate (CARAFATE) 1 g tablet Take 1 tablet (1 g total) by mouth 3 (three) times daily. Dissolve in 4 tbs warm water, swish and swallow 04/13/16   Noreene Filbert, MD  tamsulosin (FLOMAX) 0.4 MG CAPS capsule Take 0.4 mg by mouth 2 (two)  times daily.  10/14/15   [provider]  thiamine 100 MG tablet Take 100 mg by mouth daily.    [provider]  venlafaxine XR (EFFEXOR-XR) 150 MG 24 hr capsule Take 150 mg by mouth daily with breakfast.  03/09/16   [provider]  vitamin B-12 (CYANOCOBALAMIN) 1000 MCG tablet Take 1,000 mcg by mouth daily.    [provider]    Allergies  Allergen Reactions  . Penicillins Other (See Comments)    Reaction: unknown Patient is not able to answer follow up questions    Family History  Problem Relation Age of Onset  . Breast cancer Mother     Social History Social History  Substance Use Topics  . Smoking status: Current Every Day Smoker    Packs/day: 0.50    Years: 63.00    Types: Cigarettes  . Smokeless tobacco: Never Used  . Alcohol use 0.6 oz/week    1 Cans of beer per week    Review of Systems unable to obtain an adequate review of systems due to advanced dementia.  ____________________________________________   PHYSICAL EXAM:  VITAL SIGNS: ED Triage Vitals [11/12/16 1516]  Enc Vitals Group     BP 132/78     Pulse Rate 88     Resp 18     Temp 98 F (36.7 C)     Temp Source Oral     SpO2 98 %     Weight 125 lb (56.7 kg)     Height 5\' 10"  (1.778 m)     Head Circumference      Peak Flow      Pain Score      Pain Loc      Pain Edu?      Excl. in [place]?     Constitutional: alert, no distress, calm and cooperative with no complaints at this time. Eyes: Normal exam ENT   Head: Normocephalic and atraumatic   Mouth/Throat: Mucous membranes are moist. Cardiovascular: Normal rate, regular rhythm. Respiratory: Normal respiratory effort without tachypnea nor retractions. Breath sounds are clear  Gastrointestinal: Soft and nontender. No distention. Musculoskeletal: Nontender with normal range of motion in all extremities. several older bruises and skin tears to upper extremities. Neurologic:  Normal speech and language. No  gross focal neurologic deficits  Skin:  Skin is warm, dry and intact.  Psychiatric: Mood and affect are normal.   ____________________________________________   INITIAL IMPRESSION / ASSESSMENT AND PLAN / ED COURSE  Pertinent labs & imaging results that were available during my care of the patient were reviewed by me and considered in my medical decision making (see chart for details).  patient presents under IVC for behavioral disturbance at his nursing facility. Patient has a history of advanced dementia, does not recall the events  of earlier today. Differential at this time include dementia, behavioral disturbance due to dementia, behavioral disturbance due to metabolic cause. We will check basic labs, close monitoring in the emergency department and have the patient evaluated by psychiatry.   the patient has been seen by psychiatry. The IVC has been rescinded. Patient's lab work is largely at his baseline. We will discharge back to his nursing facility. ____________________________________________   FINAL CLINICAL IMPRESSION(S) / ED DIAGNOSES  dementia with behavioral disturbance    Harvest Dark, MD 11/12/16 [id number]

## 2016-11-12 NOTE — ED Notes (Signed)
BEHAVIORAL HEALTH ROUNDING Patient sleeping: No. Patient alert and oriented: yes Behavior appropriate: Yes.  ; If no, describe:  Nutrition and fluids offered: yes Toileting and hygiene offered: Yes  Sitter present: q15 minute observations and security  monitoring Law enforcement present: Yes  ODS  

## 2016-11-12 NOTE — BH Assessment (Signed)
Patient seen by Psych MD (Dr. Algie Coffer) and he does not meet inpatient criteria. TTS Consult no longer needed.

## 2016-11-12 NOTE — Consult Note (Signed)
[facility] Face-to-Face Psychiatry Consult   Reason for Consult:  Consult for 71 year old man sent here on commitment from his assisted living facility Referring Physician:  Paduchowski Patient Identification: [name] MRN:  [id number] Principal Diagnosis: Dementia with behavioral disturbance Diagnosis:   Patient Active Problem List   Diagnosis Date Noted  . Near syncope [R55]   . Encounter for hospice care discussion [Z71.89]   . Dehydration [E86.0]   . Renal insufficiency [N28.9]   . Protein-calorie malnutrition, severe [E43] 05/14/2016  . Throat cancer ([place]) [C14.0]   . Palliative care encounter [Z51.5]   . AKI (acute kidney injury) ([place]) [N17.9] 05/13/2016  . Goals of care, counseling/discussion [Z71.89] 03/01/2016  . 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 Alcohol abuse [F10.10] 11/18/2015  . Dementia with behavioral disturbance [F03.91] 10/21/2015    Total Time spent with patient: 1 hour  Subjective:   [name] is a 71 y.o. male patient admitted with "I don't remember".  HPI:  Patient interviewed chart reviewed. The commitment paperwork states that the patient was walking around his assisted living facility today naked and that he was masturbating in other people's rooms. This is the grounds for committing him. On interview today the patient says he does not know exactly why they brought him here. He says he remembers the police bringing him but can't imagine why they picked him up. I mentioned to him the allegations in the paperwork and he was able to quote scripture to me about why he would never do that. Patient denies having been walking around naked or masturbating. He does not present any acute symptoms. Denies depression. Denies suicidal or homicidal ideation. Denies any hallucinations. Patient has only partial awareness of having chronic memory problems. No current access to any substance abuse.  Social history: Patient resides in an assisted living  facility. I think he may have a guardian I'm not certain.  Medical history: History of cirrhosis related to chronic alcohol abuse chronic renal insufficiency  Substance abuse history: Long-standing alcohol abuse resulting in part of the reason of his dementia and chronic cirrhosis. No access to alcohol or other drugs currently.  Past Psychiatric History: No history of any mental health treatment other than alcohol abuse and related to dementia. In recent years we have seen the patient only occasionally for other behavior problems related to dementia. No known history of suicide attempts or violence  Risk to Self: Is patient at risk for suicide?: No Risk to Others:   Prior Inpatient Therapy:   Prior Outpatient Therapy:    Past Medical History:  Past Medical History:  Diagnosis Date  . Alcohol abuse   . Cirrhosis ([place])   . COPD (chronic obstructive pulmonary disease) ([place])   . Hepatitis C   . Malnutrition ([place])   . Throat cancer [facility])     Past Surgical History:  Procedure Laterality Date  . TONSILLECTOMY     age was 71  . VASECTOMY     Family History:  Family History  Problem Relation Age of Onset  . Breast cancer Mother    Family Psychiatric  History: Does not know of any Social History:  History  Alcohol Use  . 0.6 oz/week  . 1 Cans of beer per week     History  Drug Use No    Social History   Social History  . Marital status: Divorced    Spouse name: N/A  . Number of children: N/A  . Years of education: N/A  Social History Main Topics  . Smoking status: Current Every Day Smoker    Packs/day: 0.50    Years: 63.00    Types: Cigarettes  . Smokeless tobacco: Never Used  . Alcohol use 0.6 oz/week    1 Cans of beer per week  . Drug use: No  . Sexual activity: Not Asked   Other Topics Concern  . None   Social History Narrative  . None   Additional Social History:    Allergies:   Allergies  Allergen Reactions  . Penicillins Other (See Comments)     Reaction: unknown Patient is not able to answer follow up questions    Labs:  Results for orders placed or performed during the hospital encounter of 11/12/16 (from the past 48 hour(s))  Comprehensive metabolic panel     Status: Abnormal   Collection Time: 11/12/16  3:15 PM  Result Value Ref Range   Sodium 134 (L) 135 - 145 mmol/L   Potassium 4.2 3.5 - 5.1 mmol/L   Chloride 100 (L) 101 - 111 mmol/L   CO2 28 22 - 32 mmol/L   Glucose, Bld 80 65 - 99 mg/dL   BUN 11 6 - 20 mg/dL   Creatinine, Ser 0.90 0.61 - 1.24 mg/dL   Calcium 9.2 8.9 - 10.3 mg/dL   Total Protein 7.2 6.5 - 8.1 g/dL   Albumin 3.4 (L) 3.5 - 5.0 g/dL   AST 72 (H) 15 - 41 U/L   ALT 71 (H) 17 - 63 U/L   Alkaline Phosphatase 89 38 - 126 U/L   Total Bilirubin 0.7 0.3 - 1.2 mg/dL   GFR calc non Af Amer >60 >60 mL/min   GFR calc Af Amer >60 >60 mL/min    Comment: (NOTE) The eGFR has been calculated using the CKD EPI equation. This calculation has not been validated in all clinical situations. eGFR's persistently <60 mL/min signify possible Chronic Kidney Disease.    Anion gap 6 5 - 15  Ethanol     Status: None   Collection Time: 11/12/16  3:15 PM  Result Value Ref Range   Alcohol, Ethyl (B) <10 <10 mg/dL    Comment:        LOWEST DETECTABLE LIMIT FOR SERUM ALCOHOL IS 10 mg/dL FOR MEDICAL PURPOSES ONLY Please note change in reference range.   cbc     Status: Abnormal   Collection Time: 11/12/16  3:15 PM  Result Value Ref Range   WBC 4.6 3.8 - 10.6 K/uL   RBC 3.40 (L) 4.40 - 5.90 MIL/uL   Hemoglobin 11.8 (L) 13.0 - 18.0 g/dL   HCT 33.8 (L) 40.0 - 52.0 %   MCV 99.3 80.0 - 100.0 fL   MCH 34.6 (H) 26.0 - 34.0 pg   MCHC 34.8 32.0 - 36.0 g/dL   RDW 14.1 11.5 - 14.5 %   Platelets 180 150 - 440 K/uL  Urine Drug Screen, Qualitative     Status: None   Collection Time: 11/12/16  3:15 PM  Result Value Ref Range   Tricyclic, Ur Screen NONE DETECTED NONE DETECTED   Amphetamines, Ur Screen NONE DETECTED NONE DETECTED    MDMA (Ecstasy)Ur Screen NONE DETECTED NONE DETECTED   Cocaine Metabolite,Ur [place] NONE DETECTED NONE DETECTED   Opiate, Ur Screen NONE DETECTED NONE DETECTED   Phencyclidine (PCP) Ur S NONE DETECTED NONE DETECTED   Cannabinoid 50 Ng, Ur [place] NONE DETECTED NONE DETECTED   Barbiturates, Ur Screen NONE DETECTED NONE DETECTED   Benzodiazepine,  Ur Scrn NONE DETECTED NONE DETECTED   Methadone Scn, Ur NONE DETECTED NONE DETECTED    Comment: (NOTE) 242  Tricyclics, urine               Cutoff 1000 ng/mL 200  Amphetamines, urine             Cutoff 1000 ng/mL 300  MDMA (Ecstasy), urine           Cutoff 500 ng/mL 400  Cocaine Metabolite, urine       Cutoff 300 ng/mL 500  Opiate, urine                   Cutoff 300 ng/mL 600  Phencyclidine (PCP), urine      Cutoff 25 ng/mL 700  Cannabinoid, urine              Cutoff 50 ng/mL 800  Barbiturates, urine             Cutoff 200 ng/mL 900  Benzodiazepine, urine           Cutoff 200 ng/mL 1000 Methadone, urine                Cutoff 300 ng/mL 1100 1200 The urine drug screen provides only a preliminary, unconfirmed 1300 analytical test result and should not be used for non-medical 1400 purposes. Clinical consideration and professional judgment should 1500 be applied to any positive drug screen result due to possible 1600 interfering substances. A more specific alternate chemical method 1700 must be used in order to obtain a confirmed analytical result.  1800 Gas chromato graphy / mass spectrometry (GC/MS) is the preferred 1900 confirmatory method.     No current facility-administered medications for this encounter.    Current Outpatient Prescriptions  Medication Sig Dispense Refill  . ADVAIR DISKUS 250-50 MCG/DOSE AEPB Inhale 2 puffs into the lungs daily.    Marland Kitchen alum & mag hydroxide-simeth (MAALOX/MYLANTA) 200-200-20 MG/5ML suspension Take 15 mLs by mouth as needed for indigestion or heartburn. 355 mL 0  . Cholecalciferol (VITAMIN D3) 5000 units TABS Take  5,000 Units by mouth daily.    Marland Kitchen donepezil (ARICEPT) 5 MG tablet Take 5 mg by mouth at bedtime.     . folic acid (FOLVITE) 1 MG tablet Take 1 mg by mouth daily.    . furosemide (LASIX) 20 MG tablet Take 20 mg by mouth daily.     Marland Kitchen lactose free nutrition (BOOST PLUS) LIQD Take 237 mLs by mouth 4 (four) times daily.    . magic mouthwash w/lidocaine SOLN Take 5 mLs by mouth 3 (three) times daily as needed for mouth pain. 300 mL 0  . magnesium oxide (MAG-OX) 400 MG tablet Take 800 mg by mouth 3 (three) times daily.     Marland Kitchen morphine 10 MG/5ML solution Take 2.6 mLs by mouth every 4 (four) hours as needed for pain. Take 2.6 mLs (52 MG) by mouth every 4 (four) hours as needed for pain.    Marland Kitchen ondansetron (ZOFRAN) 8 MG tablet Take 1 tablet (8 mg total) by mouth 2 (two) times daily as needed. (Patient taking differently: Take 8 mg by mouth 2 (two) times daily as needed for nausea or vomiting. ) 30 tablet 1  . polyethylene glycol (MIRALAX / GLYCOLAX) packet Take 17 g by mouth 2 (two) times daily.     . prochlorperazine (COMPAZINE) 10 MG tablet Take 1 tablet (10 mg total) by mouth every 6 (six) hours as needed (Nausea or vomiting). 30 tablet  1  . QUEtiapine (SEROQUEL) 50 MG tablet Take 1 tablet (50 mg total) by mouth at bedtime. 30 tablet 0  . SPIRIVA HANDIHALER 18 MCG inhalation capsule Place 18 mcg into inhaler and inhale daily.     . sucralfate (CARAFATE) 1 g tablet Take 1 tablet (1 g total) by mouth 3 (three) times daily. Dissolve in 4 tbs warm water, swish and swallow 90 tablet 2  . tamsulosin (FLOMAX) 0.4 MG CAPS capsule Take 0.4 mg by mouth 2 (two) times daily.     Marland Kitchen thiamine 100 MG tablet Take 100 mg by mouth daily.    Marland Kitchen venlafaxine XR (EFFEXOR-XR) 150 MG 24 hr capsule Take 150 mg by mouth daily with breakfast.     . vitamin B-12 (CYANOCOBALAMIN) 1000 MCG tablet Take 1,000 mcg by mouth daily.     Facility-Administered Medications Ordered in Other Encounters  Medication Dose Route Frequency Provider Last  Rate Last Dose  . heparin lock flush 100 unit/mL  500 Units Intravenous Once Grayland Ormond, Kathlene November, MD        Musculoskeletal: Strength & Muscle Tone: within normal limits Gait & Station: normal Patient leans: N/A  Psychiatric Specialty Exam: Physical Exam  Nursing note and vitals reviewed. Constitutional: He appears well-developed. No distress.  HENT:  Head: Normocephalic and atraumatic.  Eyes: Pupils are equal, round, and reactive to light. Conjunctivae are normal.  Neck: Normal range of motion.  Cardiovascular: Regular rhythm and normal heart sounds.   Respiratory: Effort normal. No respiratory distress.  GI: Soft.  Musculoskeletal: Normal range of motion.  Neurological: He is alert.  Skin: Skin is warm and dry.  Psychiatric: He has a normal mood and affect. His speech is normal and behavior is normal. Thought content is not paranoid. Cognition and memory are impaired. He expresses impulsivity. He expresses no homicidal and no suicidal ideation. He exhibits abnormal recent memory and abnormal remote memory.    Review of Systems  Constitutional: Negative.   HENT: Negative.   Eyes: Negative.   Respiratory: Negative.   Cardiovascular: Negative.   Gastrointestinal: Negative.   Musculoskeletal: Negative.   Skin: Negative.   Neurological: Negative.   Psychiatric/Behavioral: Negative.     Blood pressure 132/78, pulse 88, temperature 98 F (36.7 C), temperature source Oral, resp. rate 18, height 5' 10"  (1.778 m), weight 56.7 kg (125 lb), SpO2 98 %.Body mass index is 17.94 kg/m.  General Appearance: Casual  Eye Contact:  Minimal  Speech:  Normal Rate  Volume:  Decreased  Mood:  Euthymic  Affect:  Constricted  Thought Process:  Goal Directed  Orientation:  Other:  Gen. he knows he is in the hospital but doesn't know the details or the time or date  Thought Content:  Tangential  Suicidal Thoughts:  No  Homicidal Thoughts:  No  Memory:  Immediate;   Fair Recent;    Fair Remote;   Fair  Judgement:  Impaired  Insight:  Lacking  Psychomotor Activity:  Normal  Concentration:  Concentration: Fair  Recall:  Poor  Fund of Knowledge:  Poor  Language:  Poor  Akathisia:  No  Handed:  Right  AIMS (if indicated):     Assets:  Housing  ADL's:  Impaired  Cognition:  Impaired,  Mild  Sleep:        Treatment Plan Summary: Plan 71 year old man brought here from assisted living facility on papers that alleged that he had been walking around naked and masturbating. There is nothing in any of that that meets  commitment criteria. Patient does not have any mental illness other than his chronic mild dementia. No evidence of any dangerous behavior. Behavior typical of chronic dementia and confusion. No need for psychiatric treatment or hospitalization. Discontinue IVC. Patient can be returned to his group home once worked up and medically stable by the emergency room staff.  Disposition: Patient does not meet criteria for psychiatric inpatient admission.  Alethia Berthold, MD 11/12/2016 6:02 PM

## 2016-11-12 NOTE — ED Notes (Signed)

## 2016-11-12 NOTE — ED Notes (Signed)
INFORMED BY SECRETARY THAT IVC HAS BEEN RESCINDED AND PSYCHIATRIST REPORTS THAT THE PATIENT MAY RETURN TO [place] VIA EMS  I called and asked for the RN in charge of his care - Austin - I told her the plan of care including that he has been seen by two MD and he will be returning to the memory care unit due to the doctors feel he is at baseline  - I was asked to hold   After holding for several minutes - Olivia Mackie came onto the phone - I told her the same thing that I told Latricia and that the doctors feel he is stable to return  IVC rescinded  - Olivia Mackie begins yelling at me on the phone - screaming  "He cannot come back here- you better not send him back here."  I attempted to apologize to inform her that I am only the nurse and the doctors make the treatment decision but she continued to talk loudly over me - phone call disconnected   I then call ed ED social work [phone] and informed her of what had just occurred  I will await call back from her

## 2016-11-12 NOTE — ED Notes (Signed)
He has been sitting on the bed playing with the cards he brought with him  NAD assessed  Continue to monitor

## 2016-11-12 NOTE — ED Notes (Signed)
Meal tray provided.

## 2016-11-12 NOTE — ED Triage Notes (Signed)
Pt brought in by [employer] from [facility] under IVC. Pt is reported to have wandered through the facility naked and into other resident's rooms and found masturbating. Pt is reported to have attempted to leave the facility through a window. Pt has a history of dementia. Pt calm and cooperative in triage.

## 2016-11-12 NOTE — Clinical Social Work Note (Addendum)
CSW received call from pt's Guardian [name] ([phone]) asking to please update her with pt's disposition plan. CSW updated Calvin with TTS. CSW spoke with Olivia Mackie at [facility] [phone]) who stated she was not present when situation with pt occurred, but will speak with her ED about pt being admitted back.  CSW received call from RN Amy stating pt psychiatrically cleared and having concerns with discharge back to facility. (See RN note). CSW left a voicemail for [name] and [employer] Theatre stage manager) [phone]. [name] states she just spoke with [facility] and they will accept pt back, but cannot provide transportation. CSW informed that EMS can be arranged for pt transport. [name] in agreement. CSW updated RN Amy re: pt discharge. ED Secretary to call EMS. CSW signing off as no further Social Work needs identified.   Oretha Ellis, Latanya Presser, [place] Social Worker-ED [phone]

## 2017-02-22 ENCOUNTER — Encounter: Payer: Self-pay | Admitting: Radiation Oncology

## 2017-02-22 ENCOUNTER — Other Ambulatory Visit: Payer: Self-pay

## 2017-02-22 ENCOUNTER — Ambulatory Visit
Admission: RE | Admit: 2017-02-22 | Discharge: 2017-02-22 | Disposition: A | Discharge status: Home / Self Care | Payer: Medicaid Other | Source: Ambulatory Visit | Attending: Radiation Oncology | Admitting: Radiation Oncology

## 2017-02-22 VITALS — BP 153/77 | HR 81 | Temp 96.3°F | Resp 20 | Wt 128.2 lb

## 2017-02-22 DIAGNOSIS — R918 Other nonspecific abnormal finding of lung field: Secondary | ICD-10-CM | POA: Diagnosis not present

## 2017-02-22 DIAGNOSIS — C321 Malignant neoplasm of supraglottis: Secondary | ICD-10-CM | POA: Diagnosis present

## 2017-02-22 DIAGNOSIS — F17210 Nicotine dependence, cigarettes, uncomplicated: Secondary | ICD-10-CM | POA: Insufficient documentation

## 2017-02-22 DIAGNOSIS — Z9119 Patient's noncompliance with other medical treatment and regimen: Secondary | ICD-10-CM | POA: Diagnosis not present

## 2017-02-22 DIAGNOSIS — Z923 Personal history of irradiation: Secondary | ICD-10-CM | POA: Diagnosis not present

## 2017-02-22 DIAGNOSIS — C329 Malignant neoplasm of larynx, unspecified: Secondary | ICD-10-CM

## 2017-02-22 NOTE — Progress Notes (Signed)
Radiation Oncology Follow up Note  Name: [name]   Date:   02/22/2017 MRN:  [id number] DOB: 02/24/1945    This 72 y.o. 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 He is seen today in routine follow-up and states he is doing well. He specifically denies dysphagia or head and neck pain.. Patient has been noncompliant on his follow-up exams with ENT and medical oncology. Patient is a ward of the state. Follow-up imaging with soft tissue CT scans of head and neck and chest have been ordered although the patient is not been noncompliant in going to those appointments. His last PET CT scan which I have reviewed was done in May 2018 shows excellent response to treatment. A 7 mm left lower lobe pulmonary nodule was identified and is scheduled to be followed.  COMPLICATIONS OF TREATMENT: none  FOLLOW UP COMPLIANCE: keeps appointments   PHYSICAL EXAM:  BP (!) 153/77   Pulse 81   Temp (!) 96.3 F (35.7 C)   Resp 20   Wt 128 lb 3.2 oz (58.2 kg)   BMI 18.39 kg/m  Oral cavity is clear no oral mucosal lesions are identified indirect mirror examination shows upper airway clear. No evidence of mass or nodularity in the region of the larynx are supraglottic larynx is identified vallecula and base of tongue with are within normal limits. Neck is clear without evidence of subject gastric cervical or supraclavicular adenopathy. Well-developed well-nourished patient in NAD. HEENT reveals PERLA, EOMI, discs not visualized.  Oral cavity is clear. No oral mucosal lesions are identified. Neck is clear without evidence of cervical or supraclavicular adenopathy. Lungs are  clear to A&P. Cardiac examination is essentially unremarkable with regular rate and rhythm without murmur rub or thrill. Abdomen is benign with no organomegaly or masses noted. Motor sensory and DTR levels are equal and symmetric in the upper and lower extremities. Cranial nerves II through XII are grossly intact. Proprioception is intact. No peripheral adenopathy or edema is identified. No motor or sensory levels are noted. Crude visual fields are within normal range.  RADIOLOGY RESULTS: PET scan from May is reviewed and compatible above-stated findings  PLAN: Present time were trying to work with his caretakers for follow-up with both medical oncology and ENT and scheduled follow-up reimaging of his head and neck region. I've otherwise asked to see him back in 6 months for follow-up.  I would like to take this opportunity to thank you for allowing me to participate in the care of your patient.Noreene Filbert, MD

## 2017-06-28 ENCOUNTER — Ambulatory Visit: Payer: Medicaid Other | Admitting: Radiation Oncology

## 2017-07-07 ENCOUNTER — Ambulatory Visit: Payer: Medicaid Other | Admitting: Radiation Oncology

## 2017-07-14 ENCOUNTER — Ambulatory Visit: Payer: Medicare Other | Admitting: Radiation Oncology

## 2018-06-24 ENCOUNTER — Other Ambulatory Visit: Payer: Self-pay | Admitting: Family Medicine

## 2018-06-24 DIAGNOSIS — R1310 Dysphagia, unspecified: Secondary | ICD-10-CM

## 2018-06-29 ENCOUNTER — Ambulatory Visit
Admission: RE | Admit: 2018-06-29 | Discharge: 2018-06-29 | Disposition: A | Discharge status: Home / Self Care | Payer: Medicare Other | Source: Ambulatory Visit | Attending: Family Medicine | Admitting: Family Medicine

## 2018-06-29 ENCOUNTER — Other Ambulatory Visit: Payer: Self-pay

## 2018-06-29 DIAGNOSIS — R1310 Dysphagia, unspecified: Secondary | ICD-10-CM | POA: Diagnosis not present

## 2018-11-11 ENCOUNTER — Other Ambulatory Visit: Payer: Self-pay | Admitting: Family Medicine

## 2018-11-11 DIAGNOSIS — Z86003 Personal history of in-situ neoplasm of oral cavity, esophagus and stomach: Secondary | ICD-10-CM

## 2018-11-21 ENCOUNTER — Ambulatory Visit
Admission: RE | Admit: 2018-11-21 | Discharge: 2018-11-21 | Disposition: A | Discharge status: Home / Self Care | Payer: Medicare Other | Source: Ambulatory Visit | Attending: Family Medicine | Admitting: Family Medicine

## 2018-11-21 ENCOUNTER — Other Ambulatory Visit: Payer: Self-pay

## 2018-11-21 DIAGNOSIS — C140 Malignant neoplasm of pharynx, unspecified: Secondary | ICD-10-CM | POA: Insufficient documentation

## 2018-11-21 DIAGNOSIS — R590 Localized enlarged lymph nodes: Secondary | ICD-10-CM | POA: Diagnosis not present

## 2018-11-21 DIAGNOSIS — Z86003 Personal history of in-situ neoplasm of oral cavity, esophagus and stomach: Secondary | ICD-10-CM

## 2018-11-21 DIAGNOSIS — C3490 Malignant neoplasm of unspecified part of unspecified bronchus or lung: Secondary | ICD-10-CM | POA: Diagnosis not present

## 2018-11-21 LAB — GLUCOSE, CAPILLARY: Glucose-Capillary: 90 mg/dL (ref 70–99)

## 2018-11-21 MED ORDER — FLUDEOXYGLUCOSE F - 18 (FDG) INJECTION
6.6000 | Freq: Once | INTRAVENOUS | Status: AC | PRN
  Administered 2018-11-21: 7.09 via INTRAVENOUS

## 2019-02-23 ENCOUNTER — Inpatient Hospital Stay: Payer: Medicare Other | Admitting: Oncology

## 2019-03-17 DIAGNOSIS — C3491 Malignant neoplasm of unspecified part of right bronchus or lung: Secondary | ICD-10-CM | POA: Insufficient documentation

## 2019-03-17 NOTE — Progress Notes (Signed)
[facility]  [phone] [phone]  ID: Cheo [name] OB: May 31, 1945  MR#: [id number]  XAJ#:[id number]  Patient Care Team: Remi Haggard, FNP as PCP - General (Family Medicine) Telford Nab, RN as Registered Nurse  CHIEF COMPLAINT: Clinical stage IVa squamous cell carcinoma of the right larynx, right upper lobe lung mass.  INTERVAL HISTORY: Patient was last seen in clinic in June 2018.  In October 2020 he had a PET scan which showed a large FDG avid right upper lobe lung mass.  Patient has a history of noncompliance.  Much of his history is given by his caretaker.  He continues to smoke heavily.  He has no neurologic complaints.  He denies any recent fevers or illnesses.  He has a fair appetite, but denies weight loss.  He has no chest pain, shortness of breath, cough, or hemoptysis.  He denies any nausea, vomiting, constipation, or diarrhea.  He has no urinary complaints.  Patient feels at his baseline offers no specific complaints today.  REVIEW OF SYSTEMS:    Review of Systems  Constitutional: Negative.  Negative for fever, malaise/fatigue and weight loss.  HENT: Negative.  Negative for sore throat.   Respiratory: Negative.  Negative for cough and shortness of breath.   Cardiovascular: Negative.  Negative for chest pain and leg swelling.  Gastrointestinal: Negative.  Negative for abdominal pain.  Genitourinary: Negative.  Negative for dysuria.  Musculoskeletal: Negative.  Negative for back pain.  Skin: Negative.  Negative for rash.  Neurological: Negative.  Negative for dizziness, sensory change, weakness and headaches.  Psychiatric/Behavioral: Negative.  Negative for substance abuse. The patient does not have insomnia.     As per HPI. Otherwise, a complete review of systems is negative.  PAST MEDICAL HISTORY: Past Medical History:  Diagnosis Date  . Alcohol abuse   . Cirrhosis ([place])   . COPD (chronic obstructive pulmonary disease)  ([place])   . Hepatitis C   . Malnutrition ([place])   . Throat cancer ([place])     PAST SURGICAL HISTORY: Past Surgical History:  Procedure Laterality Date  . TONSILLECTOMY     age was 12  . VASECTOMY      FAMILY HISTORY: Family History  Problem Relation Age of Onset  . Breast cancer Mother     ADVANCED DIRECTIVES (Y/N):  N  HEALTH MAINTENANCE: Social History   Tobacco Use  . Smoking status: Current Every Day Smoker    Packs/day: 0.50    Years: 63.00    Pack years: 31.50    Types: Cigarettes  . Smokeless tobacco: Never Used  Substance Use Topics  . Alcohol use: Yes    Alcohol/week: 1.0 standard drinks    Types: 1 Cans of beer per week  . Drug use: No     Colonoscopy:  PAP:  Bone density:  Lipid panel:  Allergies  Allergen Reactions  . Penicillins Other (See Comments)    Reaction: unknown Patient is not able to answer follow up questions    Current Outpatient Medications  Medication Sig Dispense Refill  . buPROPion (WELLBUTRIN SR) 150 MG 12 hr tablet bupropion HCl SR 150 mg tablet,12 hr sustained-release    . Cholecalciferol (VITAMIN D3) 5000 units TABS Take 5,000 Units by mouth daily.    Marland Kitchen docusate (COLACE) 50 MG/5ML liquid Take by mouth daily.    . ferrous sulfate 325 (65 FE) MG tablet Take 325 mg by mouth daily with breakfast.    . folic acid (FOLVITE) 1 MG tablet  Take 1 mg by mouth daily.    . hydrOXYzine (ATARAX/VISTARIL) 25 MG tablet     . losartan (COZAAR) 25 MG tablet Take 25 mg by mouth daily.    . mirtazapine (REMERON) 15 MG tablet Take 15 mg by mouth at bedtime.    . mupirocin ointment (BACTROBAN) 2 % Apply 1 application topically 2 (two) times daily.    . QUEtiapine (SEROQUEL) 50 MG tablet Take 1 tablet (50 mg total) by mouth at bedtime. 30 tablet 0  . tamsulosin (FLOMAX) 0.4 MG CAPS capsule Take 0.4 mg by mouth 2 (two) times daily.     Marland Kitchen thiamine 100 MG tablet Take 100 mg by mouth daily.    . TRELEGY ELLIPTA 100-62.5-25 MCG/INH AEPB     . vitamin C  (ASCORBIC ACID) 250 MG tablet Take 250 mg by mouth 2 (two) times daily.    Marland Kitchen alum & mag hydroxide-simeth (MAALOX/MYLANTA) 200-200-20 MG/5ML suspension Take 15 mLs by mouth as needed for indigestion or heartburn. (Patient not taking: Reported on 02/22/2017) 355 mL 0  . lactose free nutrition (BOOST PLUS) LIQD Take 237 mLs by mouth 4 (four) times daily.    . ondansetron (ZOFRAN) 8 MG tablet Take by mouth every 8 (eight) hours as needed for nausea or vomiting.    . polyethylene glycol (MIRALAX / GLYCOLAX) 17 g packet Take 17 g by mouth daily.     No current facility-administered medications for this visit.   Facility-Administered Medications Ordered in Other Visits  Medication Dose Route Frequency Provider Last Rate Last Admin  . heparin lock flush 100 unit/mL  500 Units Intravenous Once Lloyd Huger, MD        OBJECTIVE: Vitals:   03/23/19 1158 03/23/19 1214  BP: 123/68 123/68  Pulse: 79 79  Resp: 18 18  Temp: (!) 96.6 F (35.9 C) (!) 96.6 F (35.9 C)     Body mass index is 20.59 kg/m.    ECOG FS:0 - Asymptomatic  General: Thin, no acute distress. Eyes: Pink conjunctiva, anicteric sclera. HEENT: Normocephalic, moist mucous membranes. Lungs: No audible wheezing or coughing. Heart: Regular rate and rhythm. Abdomen: Soft, nontender, no obvious distention. Musculoskeletal: No edema, cyanosis, or clubbing. Neuro: Alert, answering all questions appropriately. Cranial nerves grossly intact. Skin: No rashes or petechiae noted. Psych: Normal affect.  LAB RESULTS:  Lab Results  Component Value Date   NA 134 (L) 11/12/2016   K 4.2 11/12/2016   CL 100 (L) 11/12/2016   CO2 28 11/12/2016   GLUCOSE 80 11/12/2016   BUN 11 11/12/2016   CREATININE 0.90 11/12/2016   CALCIUM 9.2 11/12/2016   PROT 7.2 11/12/2016   ALBUMIN 3.4 (L) 11/12/2016   AST 72 (H) 11/12/2016   ALT 71 (H) 11/12/2016   ALKPHOS 89 11/12/2016   BILITOT 0.7 11/12/2016   GFRNONAA >60 11/12/2016   GFRAA >60  11/12/2016    Lab Results  Component Value Date   WBC 4.6 11/12/2016   NEUTROABS 3.4 08/05/2016   HGB 11.8 (L) 11/12/2016   HCT 33.8 (L) 11/12/2016   MCV 99.3 11/12/2016   PLT 180 11/12/2016     STUDIES: No results found.  ASSESSMENT:  Clinical stage IVa squamous cell carcinoma of the right larynx,  right upper lobe lung nodule.  PLAN:    1. Right upper lobe lung mass: PET scan results from November 21, 2018 reviewed independently with a large hypermetabolic right upper lobe lung mass with associated bilateral mediastinal adenopathy.  This is likely a  second primary, but patient will require biopsy for confirmation.  Will get CT scan of the chest, abdomen, and pelvis to update his imaging.  Have ordered CT-guided biopsy, but patient also noted to have a 1.6 cm subcarinal PET positive lymph node that also may be amenable to biopsy by bronchoscopy.  He will also require brain imaging to complete the staging work-up.  Patient will return to clinic approximately 1 week after his biopsy to discuss the results and treatment planning if desired. 2. Clinical stage IVa squamous cell carcinoma of the right larynx: Patient completed treatment in approximately April 2018.  Lung lesion is most likely a second primary.  PET scan as above. 3. Social situation: Because patient is a ward of the state, he will require his power of attorney to be present for any further consents.  I spent a total of 60 minutes reviewing chart data, face-to-face evaluation with the patient, counseling and coordination of care as detailed above.   Patient expressed understanding and was in agreement with this plan. He also understands that He can call clinic at any time with any questions, concerns, or complaints.    Cancer Staging Primary cancer of larynx [facility]) Staging form: Larynx - Supraglottis, AJCC 8th Edition - Clinical stage from 02/27/2016: Stage IVA (cT1, cN2b, cM0) - Signed by Lloyd Huger, MD on  02/27/2016   Lloyd Huger, MD 03/23/19 2:43 PM

## 2019-03-23 ENCOUNTER — Inpatient Hospital Stay: Payer: Medicare Other | Attending: Oncology | Admitting: Oncology

## 2019-03-23 ENCOUNTER — Encounter (INDEPENDENT_AMBULATORY_CARE_PROVIDER_SITE_OTHER): Payer: Self-pay

## 2019-03-23 ENCOUNTER — Encounter: Payer: Self-pay | Admitting: *Deleted

## 2019-03-23 ENCOUNTER — Encounter: Payer: Self-pay | Admitting: Oncology

## 2019-03-23 ENCOUNTER — Other Ambulatory Visit: Payer: Self-pay

## 2019-03-23 VITALS — BP 123/68 | HR 79 | Temp 96.6°F | Resp 18 | Ht 67.5 in | Wt 133.4 lb

## 2019-03-23 DIAGNOSIS — R911 Solitary pulmonary nodule: Secondary | ICD-10-CM | POA: Diagnosis not present

## 2019-03-23 DIAGNOSIS — C329 Malignant neoplasm of larynx, unspecified: Secondary | ICD-10-CM

## 2019-03-23 DIAGNOSIS — Z79899 Other long term (current) drug therapy: Secondary | ICD-10-CM | POA: Insufficient documentation

## 2019-03-23 DIAGNOSIS — R634 Abnormal weight loss: Secondary | ICD-10-CM | POA: Diagnosis not present

## 2019-03-23 DIAGNOSIS — K7460 Unspecified cirrhosis of liver: Secondary | ICD-10-CM | POA: Diagnosis not present

## 2019-03-23 DIAGNOSIS — F17210 Nicotine dependence, cigarettes, uncomplicated: Secondary | ICD-10-CM | POA: Diagnosis not present

## 2019-03-23 DIAGNOSIS — J449 Chronic obstructive pulmonary disease, unspecified: Secondary | ICD-10-CM

## 2019-03-23 DIAGNOSIS — K7030 Alcoholic cirrhosis of liver without ascites: Secondary | ICD-10-CM | POA: Diagnosis not present

## 2019-03-23 NOTE — Progress Notes (Signed)
  Oncology Nurse Navigator Documentation  Navigator Location: [facility]-Med Onc (03/23/19 1400) Referral Date to RadOnc/MedOnc: 02/20/19 (03/23/19 1400) )Navigator Encounter Type: Initial MedOnc (03/23/19 1400)   Abnormal Finding Date: 11/21/18 (03/23/19 1400)                   Treatment Phase: Abnormal Scans (03/23/19 1400) Barriers/Navigation Needs: Coordination of Care (03/23/19 1400)   Interventions: Coordination of Care (03/23/19 1400)   Coordination of Care: Appts;Radiology (03/23/19 1400)        Acuity: Level 2-Minimal Needs (1-2 Barriers Identified) (03/23/19 1400)  met with patient and his caregiver during initial consultation with Dr. Grayland Ormond today to discuss PET scan results and next steps. All questions answered during visit. Reviewed upcoming appts. Informed pt's caregiver that will be in touch by phone to let him know when pt's biopsy has been scheduled. Informed that pt's legal guardian will need to be present at biopsy to sign consents for procedure. Contact info given and instructed to call with any further questions or needs. Pt and his caregiver verbalized understanding. Nothing further needed at this time.       Time Spent with Patient: 45 (03/23/19 1400)

## 2019-03-23 NOTE — Progress Notes (Signed)
New pt visit referred for possible lung cancer.  Pt has history of throat cancer and was seen in clinic in 2018 last,  Pt lives in group home in town, has a caregiver present today.

## 2019-03-28 ENCOUNTER — Telehealth: Payer: Self-pay | Admitting: Emergency Medicine

## 2019-03-28 NOTE — Telephone Encounter (Signed)
Called pt to let him know that his biopsy is scheduled for 2/24 with an arrival time of 9:00. Also let him know that he would need a COVID test prior, and that once that was scheduled we would call him back with that date and time. Pt verbalized understanding and didn't have any further questions or concerns.

## 2019-03-29 ENCOUNTER — Other Ambulatory Visit: Payer: Self-pay | Admitting: Emergency Medicine

## 2019-03-29 DIAGNOSIS — C329 Malignant neoplasm of larynx, unspecified: Secondary | ICD-10-CM

## 2019-03-30 ENCOUNTER — Other Ambulatory Visit: Payer: Self-pay | Admitting: Emergency Medicine

## 2019-03-30 ENCOUNTER — Ambulatory Visit
Admission: RE | Admit: 2019-03-30 | Discharge: 2019-03-30 | Disposition: A | Discharge status: Home / Self Care | Payer: Medicare Other | Source: Ambulatory Visit | Attending: Oncology | Admitting: Oncology

## 2019-03-30 ENCOUNTER — Other Ambulatory Visit: Payer: Self-pay

## 2019-03-30 ENCOUNTER — Other Ambulatory Visit
Admission: RE | Admit: 2019-03-30 | Discharge: 2019-03-30 | Disposition: A | Discharge status: Home / Self Care | Payer: Medicare Other | Source: Ambulatory Visit | Attending: Oncology | Admitting: Oncology

## 2019-03-30 DIAGNOSIS — R911 Solitary pulmonary nodule: Secondary | ICD-10-CM | POA: Diagnosis present

## 2019-03-30 DIAGNOSIS — R634 Abnormal weight loss: Secondary | ICD-10-CM | POA: Insufficient documentation

## 2019-03-30 DIAGNOSIS — C329 Malignant neoplasm of larynx, unspecified: Secondary | ICD-10-CM | POA: Insufficient documentation

## 2019-03-30 LAB — CREATININE, SERUM
Creatinine, Ser: 1.07 mg/dL (ref 0.61–1.24)
GFR calc Af Amer: >60 mL/min (ref >60)
GFR calc non Af Amer: >60 mL/min (ref >60)

## 2019-03-30 MED ORDER — IOHEXOL 300 MG/ML  SOLN
100.0000 mL | Freq: Once | INTRAMUSCULAR | Status: AC | PRN
  Administered 2019-03-30: 100 mL via INTRAVENOUS

## 2019-04-10 ENCOUNTER — Other Ambulatory Visit
Admission: RE | Admit: 2019-04-10 | Discharge: 2019-04-10 | Disposition: A | Discharge status: Home / Self Care | Payer: Medicare Other | Source: Ambulatory Visit | Attending: Oncology | Admitting: Oncology

## 2019-04-10 DIAGNOSIS — Z20822 Contact with and (suspected) exposure to covid-19: Secondary | ICD-10-CM | POA: Diagnosis not present

## 2019-04-10 DIAGNOSIS — Z01812 Encounter for preprocedural laboratory examination: Secondary | ICD-10-CM | POA: Insufficient documentation

## 2019-04-10 LAB — SARS CORONAVIRUS 2 (TAT 6-24 HRS): SARS Coronavirus 2: NEGATIVE

## 2019-04-11 ENCOUNTER — Other Ambulatory Visit: Payer: Self-pay | Admitting: Radiology

## 2019-04-12 ENCOUNTER — Other Ambulatory Visit: Payer: Self-pay

## 2019-04-12 ENCOUNTER — Ambulatory Visit
Admission: RE | Admit: 2019-04-12 | Discharge: 2019-04-12 | Disposition: A | Discharge status: Home / Self Care | Payer: Medicare Other | Source: Ambulatory Visit | Attending: Oncology | Admitting: Oncology

## 2019-04-12 ENCOUNTER — Ambulatory Visit
Admission: RE | Admit: 2019-04-12 | Discharge: 2019-04-12 | Disposition: A | Discharge status: Home / Self Care | Payer: Medicare Other | Source: Ambulatory Visit | Attending: Interventional Radiology | Admitting: Interventional Radiology

## 2019-04-12 DIAGNOSIS — K7460 Unspecified cirrhosis of liver: Secondary | ICD-10-CM | POA: Insufficient documentation

## 2019-04-12 DIAGNOSIS — J449 Chronic obstructive pulmonary disease, unspecified: Secondary | ICD-10-CM | POA: Diagnosis not present

## 2019-04-12 DIAGNOSIS — Z9889 Other specified postprocedural states: Secondary | ICD-10-CM

## 2019-04-12 DIAGNOSIS — F17210 Nicotine dependence, cigarettes, uncomplicated: Secondary | ICD-10-CM | POA: Diagnosis not present

## 2019-04-12 DIAGNOSIS — Z803 Family history of malignant neoplasm of breast: Secondary | ICD-10-CM | POA: Diagnosis not present

## 2019-04-12 DIAGNOSIS — Z79890 Hormone replacement therapy (postmenopausal): Secondary | ICD-10-CM | POA: Diagnosis not present

## 2019-04-12 DIAGNOSIS — Z8619 Personal history of other infectious and parasitic diseases: Secondary | ICD-10-CM | POA: Diagnosis not present

## 2019-04-12 DIAGNOSIS — Z8521 Personal history of malignant neoplasm of larynx: Secondary | ICD-10-CM | POA: Diagnosis not present

## 2019-04-12 DIAGNOSIS — R911 Solitary pulmonary nodule: Secondary | ICD-10-CM

## 2019-04-12 DIAGNOSIS — C3411 Malignant neoplasm of upper lobe, right bronchus or lung: Secondary | ICD-10-CM | POA: Diagnosis not present

## 2019-04-12 DIAGNOSIS — Z79899 Other long term (current) drug therapy: Secondary | ICD-10-CM | POA: Insufficient documentation

## 2019-04-12 DIAGNOSIS — F0390 Unspecified dementia without behavioral disturbance: Secondary | ICD-10-CM | POA: Diagnosis not present

## 2019-04-12 HISTORY — DX: Unspecified dementia, unspecified severity, without behavioral disturbance, psychotic disturbance, mood disturbance, and anxiety: F03.90

## 2019-04-12 LAB — CBC
HCT: 32.5 % — ABNORMAL LOW (ref 39.0–52.0)
Hemoglobin: 11 g/dL — ABNORMAL LOW (ref 13.0–17.0)
MCH: 32.4 pg (ref 26.0–34.0)
MCHC: 33.8 g/dL (ref 30.0–36.0)
MCV: 95.9 fL (ref 80.0–100.0)
Platelets: 141 10*3/uL — ABNORMAL LOW (ref 150–400)
RBC: 3.39 MIL/uL — ABNORMAL LOW (ref 4.22–5.81)
RDW: 14.6 % (ref 11.5–15.5)
WBC: 5.2 10*3/uL (ref 4.0–10.5)
nRBC: 0 % (ref 0.0–0.2)

## 2019-04-12 LAB — PROTIME-INR
INR: 1.3 — ABNORMAL HIGH (ref 0.8–1.2)
Prothrombin Time: 15.7 seconds — ABNORMAL HIGH (ref 11.4–15.2)

## 2019-04-12 MED ORDER — MIDAZOLAM HCL 5 MG/5ML IJ SOLN
INTRAMUSCULAR | Status: AC
  Filled 2019-04-12: qty 5

## 2019-04-12 MED ORDER — MIDAZOLAM HCL 5 MG/5ML IJ SOLN
INTRAMUSCULAR | Status: AC | PRN
  Administered 2019-04-12: 0.5 mg via INTRAVENOUS

## 2019-04-12 MED ORDER — FENTANYL CITRATE (PF) 100 MCG/2ML IJ SOLN
INTRAMUSCULAR | Status: AC
  Filled 2019-04-12: qty 2

## 2019-04-12 MED ORDER — SODIUM CHLORIDE 0.9 % IV SOLN
INTRAVENOUS | Status: DC
  Administered 2019-04-12: 10 mL/h via INTRAVENOUS

## 2019-04-12 MED ORDER — FENTANYL CITRATE (PF) 100 MCG/2ML IJ SOLN
INTRAMUSCULAR | Status: AC | PRN
  Administered 2019-04-12: 25 ug via INTRAVENOUS

## 2019-04-12 NOTE — Procedures (Signed)
Interventional Radiology Procedure Note  Procedure: CT guided biopsy of RUL pulmonary mass.  Complications: No immediate Recommendations: - Bedrest until CXR cleared.  Minimize talking, coughing or otherwise straining.  - Follow up 2 hr CXR pending   Signed,  Criselda Peaches, MD

## 2019-04-12 NOTE — Progress Notes (Signed)
Informed Mickel Fuchs of patient's post procedure status and d/c to care home; stated no questions and verbalized ok. Also stated only Bethanne Ginger or Minna Merritts Day

## 2019-04-12 NOTE — H&P (Signed)
Chief Complaint: Patient was seen in consultation today for right upper lobe lung nodule at the request of Finnegan,Timothy J  Referring Physician(s): Finnegan,Timothy J  Patient Status: [facility] - Out-pt  History of Present Illness: [name] is a 74 y.o. 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  He presents today for CT-guided biopsy in an effort to confirm tissue diagnosis.    Patient is a relatively poor historian but denies any active complaints at this time.  We are having some difficulty contacting his caregiver to obtain consent for the procedure.  Past Medical History:  Diagnosis Date  . Alcohol abuse   . Cirrhosis ([place])   . COPD (chronic obstructive pulmonary disease) ([place])   . Dementia ([place])   . Hepatitis C   . Malnutrition ([place])   . Throat cancer [facility])     Past Surgical History:  Procedure Laterality Date  . TONSILLECTOMY     age was 53  . VASECTOMY      Allergies: Penicillins  Medications: Prior to Admission medications   Medication Sig Start Date End Date Taking? Authorizing Provider  buPROPion (WELLBUTRIN SR) 150 MG 12 hr tablet bupropion HCl SR 150 mg tablet,12 hr sustained-release   Yes [provider]  Cholecalciferol (VITAMIN D3) 5000 units TABS Take 5,000 Units by mouth daily.   Yes [provider]  docusate (COLACE) 50 MG/5ML liquid Take by mouth daily.   Yes [provider]  ferrous sulfate 325 (65 FE) MG tablet Take 325 mg by mouth daily with breakfast.   Yes [provider]  folic acid (FOLVITE) 1 MG tablet Take 1 mg by mouth daily.   Yes [provider]  hydrOXYzine (ATARAX/VISTARIL) 25 MG tablet  02/16/19  Yes [provider]  lactose free nutrition (BOOST PLUS) LIQD Take 237 mLs by mouth 4 (four) times daily.   Yes [provider]  levothyroxine (SYNTHROID) 100 MCG tablet Take 100  mcg by mouth daily before breakfast.   Yes [provider]  losartan (COZAAR) 25 MG tablet Take 25 mg by mouth daily. 03/20/19  Yes [provider]  mirtazapine (REMERON) 15 MG tablet Take 15 mg by mouth at bedtime. 03/20/19  Yes [provider]  ondansetron (ZOFRAN) 8 MG tablet Take by mouth every 8 (eight) hours as needed for nausea or vomiting.   Yes [provider]  QUEtiapine (SEROQUEL) 25 MG tablet Take 25 mg by mouth 3 (three) times daily.   Yes [provider]  QUEtiapine (SEROQUEL) 50 MG tablet Take 1 tablet (50 mg total) by mouth at bedtime. 05/26/16  Yes Mody, Ulice Bold, MD  tamsulosin (FLOMAX) 0.4 MG CAPS capsule Take 0.4 mg by mouth 2 (two) times daily.  10/14/15  Yes [provider]  thiamine 100 MG tablet Take 100 mg by mouth daily.   Yes [provider]  Donnal Debar 100-62.5-25 MCG/INH AEPB  02/28/19  Yes [provider]  vitamin C (ASCORBIC ACID) 250 MG tablet Take 250 mg by mouth 2 (two) times daily.   Yes [provider]  alum & mag hydroxide-simeth (MAALOX/MYLANTA) 200-200-20 MG/5ML suspension Take 15 mLs by mouth as needed for indigestion or heartburn. Patient not taking: Reported on 02/22/2017 05/15/16   Nicholes Mango, MD  mupirocin ointment (BACTROBAN) 2 % Apply 1 application topically 2 (two) times daily.    [provider]  polyethylene  glycol (MIRALAX / GLYCOLAX) 17 g packet Take 17 g by mouth daily.    [provider]     Family History  Problem Relation Age of Onset  . Breast cancer Mother     Social History   Socioeconomic History  . Marital status: Divorced    Spouse name: Not on file  . Number of children: Not on file  . Years of education: Not on file  . Highest education level: Not on file  Occupational History  . Not on file  Tobacco Use  . Smoking status: Current Every Day Smoker    Packs/day: 0.50    Years: 63.00    Pack years: 31.50    Types: Cigarettes  .  Smokeless tobacco: Never Used  Substance and Sexual Activity  . Alcohol use: Not Currently    Alcohol/week: 1.0 standard drinks    Types: 1 Cans of beer per week  . Drug use: No  . Sexual activity: Not Currently  Other Topics Concern  . Not on file  Social History Narrative   [facility]   Social Determinants of Health   Financial Resource Strain:   . Difficulty of Paying Living Expenses: Not on file  Food Insecurity:   . Worried About Charity fundraiser in the Last Year: Not on file  . Ran Out of Food in the Last Year: Not on file  Transportation Needs:   . Lack of Transportation (Medical): Not on file  . Lack of Transportation (Non-Medical): Not on file  Physical Activity:   . Days of Exercise per Week: Not on file  . Minutes of Exercise per Session: Not on file  Stress:   . Feeling of Stress : Not on file  Social Connections:   . Frequency of Communication with Friends and Family: Not on file  . Frequency of Social Gatherings with Friends and Family: Not on file  . Attends Religious Services: Not on file  . Active Member of Clubs or Organizations: Not on file  . Attends Archivist Meetings: Not on file  . Marital Status: Not on file    Review of Systems: A 12 point ROS discussed and pertinent positives are indicated in the HPI above.  All other systems are negative.  Review of Systems  Vital Signs: BP 133/66   Pulse 76   Temp 98.2 F (36.8 C) (Oral)   Resp 15   Ht 5' 7.5" (1.715 m)   Wt 59 kg   SpO2 100%   BMI 20.06 kg/m   Physical Exam Constitutional:      General: He is not in acute distress. HENT:     Head: Normocephalic and atraumatic.  Eyes:     General: No scleral icterus. Cardiovascular:     Rate and Rhythm: Normal rate.  Pulmonary:     Effort: Pulmonary effort is normal.     Breath sounds: Normal breath sounds.  Abdominal:     General: Abdomen is flat.     Palpations: Abdomen is soft.  Skin:    General:  Skin is warm and dry.  Neurological:     Mental Status: He is alert.     Imaging: CT Chest W Contrast  Result Date: 03/30/2019 CLINICAL DATA:  Follow-up lung cancer EXAM: CT CHEST, ABDOMEN, AND PELVIS WITH CONTRAST TECHNIQUE: Multidetector CT imaging of the chest, abdomen and pelvis was performed following the standard protocol during bolus administration of intravenous contrast. CONTRAST:  18mL OMNIPAQUE IOHEXOL  300 MG/ML SOLN, additional oral enteric contrast COMPARISON:  PET-CT, 11/21/2018 FINDINGS: CT CHEST FINDINGS Cardiovascular: Aortic atherosclerosis. Normal heart size. Coronary artery calcifications and/or stents. No pericardial effusion. Mediastinum/Nodes: Significant enlargement of a left hilar lymph node, measuring 2.5 x 1.6 cm, previously 1.5 x 0.9 cm, and a large subcarinal lymph node, measuring 3.7 x 2.0 cm, previously 3.2 x 1.6 cm (series 2, image 28, 34). Other nodes are unchanged. Thyroid gland, trachea, and esophagus demonstrate no significant findings. Lungs/Pleura: Mild centrilobular and paraseptal emphysema. Redemonstrated spiculated mass of the apical right upper lobe, increased in size compared to prior examination measuring 3.3 x 2.9 cm, previously 2.7 x 2.4 cm when measured similarly (series 3, image 36). Slight interval enlargement of an adjacent spiculated nodule of the medial right apex, measuring 0.8 cm, previously 0.6 cm (series 3, image 25). Small right pleural effusion and associated atelectasis or consolidation, not significantly changed compared to prior examination. Benign calcified nodules of the left lower lobe and right middle lobe are unchanged. Musculoskeletal: No chest wall mass or suspicious bone lesions identified. CT ABDOMEN PELVIS FINDINGS Hepatobiliary: Cirrhotic morphology of the liver. Gallstones. No gallbladder wall thickening, or biliary dilatation. Pancreas: Unremarkable. No pancreatic ductal dilatation or surrounding inflammatory changes. Spleen: Normal  in size without significant abnormality. Adrenals/Urinary Tract: Adrenal glands are unremarkable. Kidneys are normal, without renal calculi, solid lesion, or hydronephrosis. Bladder is unremarkable. Stomach/Bowel: Esophageal varices. Stomach is within normal limits. Appendix is surgically absent no evidence of bowel wall thickening, distention, or inflammatory changes. Sigmoid diverticulosis without evidence of acute diverticulitis. Large burden of stool throughout the colon. Vascular/Lymphatic: Aortic atherosclerosis. Unchanged prominent retroperitoneal lymph nodes, not previously FDG avid (series 2, image 75). Reproductive: No mass or other abnormality. Other: No abdominal wall hernia or abnormality. No abdominopelvic ascites. Musculoskeletal: No acute or significant osseous findings. IMPRESSION: 1. Interval enlargement of spiculated mass of the apical right upper lobe and adjacent spiculated nodule of the medial right apex, previously FDG avid and consistent with worsened malignancy. 2. Interval enlargement of left hilar and subcarinal lymphadenopathy, likewise previously FDG avid and consistent with worsened nodal metastatic disease. 3. Unchanged small right pleural effusion and associated atelectasis or consolidation. 4. Stable small right pleural effusion, presumably malignant, and associated atelectasis or consolidation. 5. No evidence of distant metastatic disease within the abdomen or pelvis. 6.  Emphysema (ICD10-J43.9). 7. Coronary artery disease.  Aortic Atherosclerosis (ICD10-I70.0). 8. Hepatic cirrhosis and esophageal varices. 9. Cholelithiasis. Electronically Signed   By: Eddie Candle M.D.   On: 03/30/2019 14:26   CT Abdomen Pelvis W Contrast  Result Date: 03/30/2019 CLINICAL DATA:  Follow-up lung cancer EXAM: CT CHEST, ABDOMEN, AND PELVIS WITH CONTRAST TECHNIQUE: Multidetector CT imaging of the chest, abdomen and pelvis was performed following the standard protocol during bolus administration of  intravenous contrast. CONTRAST:  138mL OMNIPAQUE IOHEXOL 300 MG/ML SOLN, additional oral enteric contrast COMPARISON:  PET-CT, 11/21/2018 FINDINGS: CT CHEST FINDINGS Cardiovascular: Aortic atherosclerosis. Normal heart size. Coronary artery calcifications and/or stents. No pericardial effusion. Mediastinum/Nodes: Significant enlargement of a left hilar lymph node, measuring 2.5 x 1.6 cm, previously 1.5 x 0.9 cm, and a large subcarinal lymph node, measuring 3.7 x 2.0 cm, previously 3.2 x 1.6 cm (series 2, image 28, 34). Other nodes are unchanged. Thyroid gland, trachea, and esophagus demonstrate no significant findings. Lungs/Pleura: Mild centrilobular and paraseptal emphysema. Redemonstrated spiculated mass of the apical right upper lobe, increased in size compared to prior examination measuring 3.3 x 2.9 cm, previously 2.7 x 2.4  cm when measured similarly (series 3, image 36). Slight interval enlargement of an adjacent spiculated nodule of the medial right apex, measuring 0.8 cm, previously 0.6 cm (series 3, image 25). Small right pleural effusion and associated atelectasis or consolidation, not significantly changed compared to prior examination. Benign calcified nodules of the left lower lobe and right middle lobe are unchanged. Musculoskeletal: No chest wall mass or suspicious bone lesions identified. CT ABDOMEN PELVIS FINDINGS Hepatobiliary: Cirrhotic morphology of the liver. Gallstones. No gallbladder wall thickening, or biliary dilatation. Pancreas: Unremarkable. No pancreatic ductal dilatation or surrounding inflammatory changes. Spleen: Normal in size without significant abnormality. Adrenals/Urinary Tract: Adrenal glands are unremarkable. Kidneys are normal, without renal calculi, solid lesion, or hydronephrosis. Bladder is unremarkable. Stomach/Bowel: Esophageal varices. Stomach is within normal limits. Appendix is surgically absent no evidence of bowel wall thickening, distention, or inflammatory  changes. Sigmoid diverticulosis without evidence of acute diverticulitis. Large burden of stool throughout the colon. Vascular/Lymphatic: Aortic atherosclerosis. Unchanged prominent retroperitoneal lymph nodes, not previously FDG avid (series 2, image 75). Reproductive: No mass or other abnormality. Other: No abdominal wall hernia or abnormality. No abdominopelvic ascites. Musculoskeletal: No acute or significant osseous findings. IMPRESSION: 1. Interval enlargement of spiculated mass of the apical right upper lobe and adjacent spiculated nodule of the medial right apex, previously FDG avid and consistent with worsened malignancy. 2. Interval enlargement of left hilar and subcarinal lymphadenopathy, likewise previously FDG avid and consistent with worsened nodal metastatic disease. 3. Unchanged small right pleural effusion and associated atelectasis or consolidation. 4. Stable small right pleural effusion, presumably malignant, and associated atelectasis or consolidation. 5. No evidence of distant metastatic disease within the abdomen or pelvis. 6.  Emphysema (ICD10-J43.9). 7. Coronary artery disease.  Aortic Atherosclerosis (ICD10-I70.0). 8. Hepatic cirrhosis and esophageal varices. 9. Cholelithiasis. Electronically Signed   By: Eddie Candle M.D.   On: 03/30/2019 14:26    Labs:  CBC: Recent Labs    04/12/19 0930  WBC 5.2  HGB 11.0*  HCT 32.5*  PLT 141*    COAGS: Recent Labs    04/12/19 0930  INR 1.3*    BMP: Recent Labs    03/30/19 1056  CREATININE 1.07  GFRNONAA >60  GFRAA >60    LIVER FUNCTION TESTS: No results for input(s): BILITOT, AST, ALT, ALKPHOS, PROT, ALBUMIN in the last 8760 hours.  TUMOR MARKERS: No results for input(s): AFPTM, CEA, CA199, CHROMGRNA in the last 8760 hours.  Assessment and Plan:  Enlarging and hypermetabolic right upper lobe pulmonary mass concerning for either esophageal cancer metastasis versus a metachronous primary bronchogenic  carcinoma.  Patient's lungs are emphysematous, he is at increased risk for pneumothorax.  1.) Proceed with CT-guided biopsy once we are able to obtain consent from his guardian.  Thank you for this interesting consult.  I greatly enjoyed meeting [name] and look forward to participating in their care.  A copy of this report was sent to the requesting provider on this date.  Electronically Signed: Jacqulynn Cadet, MD 04/12/2019, 10:36 AM   I spent a total of  15 Minutes   in face to face in clinical consultation, greater than 50% of which was counseling/coordinating care for right upper lobe lung mass.

## 2019-04-14 ENCOUNTER — Other Ambulatory Visit: Payer: Self-pay | Admitting: Anatomic Pathology & Clinical Pathology

## 2019-04-14 LAB — SURGICAL PATHOLOGY

## 2019-04-15 NOTE — Progress Notes (Signed)
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  INTERVAL HISTORY: Patient returns to clinic today for further evaluation and discussion of his biopsy results.  He continues to feel well and remains asymptomatic. Much of his history is given by his caretaker.  He continues to smoke heavily.  He has no neurologic complaints.  He denies any recent fevers or illnesses.  He has a fair appetite, but denies weight loss.  He has no chest pain, shortness of breath, cough, or hemoptysis.  He denies any nausea, vomiting, constipation, or diarrhea.  He has no urinary complaints.  Patient offers no specific complaints today.  REVIEW OF SYSTEMS:    Review of Systems  Constitutional: Negative.  Negative for fever, malaise/fatigue and weight loss.  HENT: Negative.  Negative for sore throat.   Respiratory: Negative.  Negative for cough and shortness of breath.   Cardiovascular: Negative.  Negative for chest pain and leg swelling.  Gastrointestinal: Negative.  Negative for abdominal pain.  Genitourinary: Negative.  Negative for dysuria.  Musculoskeletal: Negative.  Negative for back pain.  Skin: Negative.  Negative for rash.  Neurological: Negative.  Negative for dizziness, sensory change, weakness and headaches.  Psychiatric/Behavioral: Negative.  Negative for substance abuse. The patient does not have insomnia.     As per HPI. Otherwise, a complete review of systems is negative.  PAST MEDICAL HISTORY: Past Medical History:  Diagnosis Date  . Alcohol abuse   . Cirrhosis ([place])   . COPD (chronic obstructive pulmonary disease) ([place])     . Dementia ([place])   . Hepatitis C   . Malnutrition ([place])   . Throat cancer ([place])     PAST SURGICAL HISTORY: Past Surgical History:  Procedure Laterality Date  . TONSILLECTOMY     age was 59  . VASECTOMY      FAMILY HISTORY: Family History  Problem Relation Age of Onset  . Breast cancer Mother     ADVANCED DIRECTIVES (Y/N):  N  HEALTH MAINTENANCE: Social History   Tobacco Use  . Smoking status: Current Every Day Smoker    Packs/day: 0.50    Years: 63.00    Pack years: 31.50    Types: Cigarettes  . Smokeless tobacco: Never Used  Substance Use Topics  . Alcohol use: Not Currently    Alcohol/week: 1.0 standard drinks    Types: 1 Cans of beer per week  . Drug use: No     Colonoscopy:  PAP:  Bone density:  Lipid panel:  Allergies  Allergen Reactions  . Penicillins Other (See Comments)    Reaction: unknown Patient is not able to answer follow up questions    Current Outpatient Medications  Medication Sig Dispense Refill  . buPROPion (WELLBUTRIN SR) 150 MG 12 hr tablet bupropion HCl SR 150 mg tablet,12 hr sustained-release    . Cholecalciferol (VITAMIN D3) 5000 units TABS Take 5,000 Units by mouth daily.    Marland Kitchen docusate (COLACE) 50 MG/5ML liquid Take by mouth daily.    . ferrous sulfate 325 (65 FE) MG tablet Take 325 mg by mouth daily with breakfast.    . folic acid (FOLVITE) 1 MG tablet Take 1 mg  by mouth daily.    . hydrOXYzine (ATARAX/VISTARIL) 25 MG tablet     . lactose free nutrition (BOOST PLUS) LIQD Take 237 mLs by mouth 4 (four) times daily.    Marland Kitchen levothyroxine (SYNTHROID) 100 MCG tablet Take 100 mcg by mouth daily before breakfast.    . losartan (COZAAR) 25 MG tablet Take 25 mg by mouth daily.    . mirtazapine (REMERON) 15 MG tablet Take 15 mg by mouth at bedtime.    . mupirocin ointment (BACTROBAN) 2 % Apply 1 application topically 2 (two) times daily.    . ondansetron (ZOFRAN) 8 MG tablet Take by mouth every 8 (eight) hours as needed for nausea or  vomiting.    . polyethylene glycol (MIRALAX / GLYCOLAX) 17 g packet Take 17 g by mouth daily.    . QUEtiapine (SEROQUEL) 25 MG tablet Take 25 mg by mouth 3 (three) times daily.    . QUEtiapine (SEROQUEL) 50 MG tablet Take 1 tablet (50 mg total) by mouth at bedtime. 30 tablet 0  . tamsulosin (FLOMAX) 0.4 MG CAPS capsule Take 0.4 mg by mouth 2 (two) times daily.     Marland Kitchen thiamine 100 MG tablet Take 100 mg by mouth daily.    . TRELEGY ELLIPTA 100-62.5-25 MCG/INH AEPB     . vitamin C (ASCORBIC ACID) 250 MG tablet Take 250 mg by mouth 2 (two) times daily.    Marland Kitchen alum & mag hydroxide-simeth (MAALOX/MYLANTA) 200-200-20 MG/5ML suspension Take 15 mLs by mouth as needed for indigestion or heartburn. (Patient not taking: Reported on 02/22/2017) 355 mL 0   No current facility-administered medications for this visit.   Facility-Administered Medications Ordered in Other Visits  Medication Dose Route Frequency Provider Last Rate Last Admin  . heparin lock flush 100 unit/mL  500 Units Intravenous Once Lloyd Huger, MD        OBJECTIVE: Vitals:   04/20/19 0957  BP: 120/80  Pulse: 97  Temp: (!) 97 F (36.1 C)     Body mass index is 20.82 kg/m.    ECOG FS:0 - Asymptomatic  General: Thin, no acute distress. Eyes: Pink conjunctiva, anicteric sclera. HEENT: Normocephalic, moist mucous membranes. Lungs: No audible wheezing or coughing. Heart: Regular rate and rhythm. Abdomen: Soft, nontender, no obvious distention. Musculoskeletal: No edema, cyanosis, or clubbing. Neuro: Alert, answering all questions appropriately. Cranial nerves grossly intact. Skin: No rashes or petechiae noted. Psych: Normal affect.   LAB RESULTS:  Lab Results  Component Value Date   NA 134 (L) 11/12/2016   K 4.2 11/12/2016   CL 100 (L) 11/12/2016   CO2 28 11/12/2016   GLUCOSE 80 11/12/2016   BUN 11 11/12/2016   CREATININE 1.07 03/30/2019   CALCIUM 9.2 11/12/2016   PROT 7.2 11/12/2016   ALBUMIN 3.4 (L) 11/12/2016    AST 72 (H) 11/12/2016   ALT 71 (H) 11/12/2016   ALKPHOS 89 11/12/2016   BILITOT 0.7 11/12/2016   GFRNONAA >60 03/30/2019   GFRAA >60 03/30/2019    Lab Results  Component Value Date   WBC 5.2 04/12/2019   NEUTROABS 3.4 08/05/2016   HGB 11.0 (L) 04/12/2019   HCT 32.5 (L) 04/12/2019   MCV 95.9 04/12/2019   PLT 141 (L) 04/12/2019     STUDIES: CT Chest W Contrast  Result Date: 03/30/2019 CLINICAL DATA:  Follow-up lung cancer EXAM: CT CHEST, ABDOMEN, AND PELVIS WITH CONTRAST TECHNIQUE: Multidetector CT imaging of the chest, abdomen and pelvis was performed following the standard protocol during bolus  administration of intravenous contrast. CONTRAST:  162mL OMNIPAQUE IOHEXOL 300 MG/ML SOLN, additional oral enteric contrast COMPARISON:  PET-CT, 11/21/2018 FINDINGS: CT CHEST FINDINGS Cardiovascular: Aortic atherosclerosis. Normal heart size. Coronary artery calcifications and/or stents. No pericardial effusion. Mediastinum/Nodes: Significant enlargement of a left hilar lymph node, measuring 2.5 x 1.6 cm, previously 1.5 x 0.9 cm, and a large subcarinal lymph node, measuring 3.7 x 2.0 cm, previously 3.2 x 1.6 cm (series 2, image 28, 34). Other nodes are unchanged. Thyroid gland, trachea, and esophagus demonstrate no significant findings. Lungs/Pleura: Mild centrilobular and paraseptal emphysema. Redemonstrated spiculated mass of the apical right upper lobe, increased in size compared to prior examination measuring 3.3 x 2.9 cm, previously 2.7 x 2.4 cm when measured similarly (series 3, image 36). Slight interval enlargement of an adjacent spiculated nodule of the medial right apex, measuring 0.8 cm, previously 0.6 cm (series 3, image 25). Small right pleural effusion and associated atelectasis or consolidation, not significantly changed compared to prior examination. Benign calcified nodules of the left lower lobe and right middle lobe are unchanged. Musculoskeletal: No chest wall mass or suspicious bone  lesions identified. CT ABDOMEN PELVIS FINDINGS Hepatobiliary: Cirrhotic morphology of the liver. Gallstones. No gallbladder wall thickening, or biliary dilatation. Pancreas: Unremarkable. No pancreatic ductal dilatation or surrounding inflammatory changes. Spleen: Normal in size without significant abnormality. Adrenals/Urinary Tract: Adrenal glands are unremarkable. Kidneys are normal, without renal calculi, solid lesion, or hydronephrosis. Bladder is unremarkable. Stomach/Bowel: Esophageal varices. Stomach is within normal limits. Appendix is surgically absent no evidence of bowel wall thickening, distention, or inflammatory changes. Sigmoid diverticulosis without evidence of acute diverticulitis. Large burden of stool throughout the colon. Vascular/Lymphatic: Aortic atherosclerosis. Unchanged prominent retroperitoneal lymph nodes, not previously FDG avid (series 2, image 75). Reproductive: No mass or other abnormality. Other: No abdominal wall hernia or abnormality. No abdominopelvic ascites. Musculoskeletal: No acute or significant osseous findings. IMPRESSION: 1. Interval enlargement of spiculated mass of the apical right upper lobe and adjacent spiculated nodule of the medial right apex, previously FDG avid and consistent with worsened malignancy. 2. Interval enlargement of left hilar and subcarinal lymphadenopathy, likewise previously FDG avid and consistent with worsened nodal metastatic disease. 3. Unchanged small right pleural effusion and associated atelectasis or consolidation. 4. Stable small right pleural effusion, presumably malignant, and associated atelectasis or consolidation. 5. No evidence of distant metastatic disease within the abdomen or pelvis. 6.  Emphysema (ICD10-J43.9). 7. Coronary artery disease.  Aortic Atherosclerosis (ICD10-I70.0). 8. Hepatic cirrhosis and esophageal varices. 9. Cholelithiasis. Electronically Signed   By: Eddie Candle M.D.   On: 03/30/2019 14:26   CT Abdomen Pelvis W  Contrast  Result Date: 03/30/2019 CLINICAL DATA:  Follow-up lung cancer EXAM: CT CHEST, ABDOMEN, AND PELVIS WITH CONTRAST TECHNIQUE: Multidetector CT imaging of the chest, abdomen and pelvis was performed following the standard protocol during bolus administration of intravenous contrast. CONTRAST:  155mL OMNIPAQUE IOHEXOL 300 MG/ML SOLN, additional oral enteric contrast COMPARISON:  PET-CT, 11/21/2018 FINDINGS: CT CHEST FINDINGS Cardiovascular: Aortic atherosclerosis. Normal heart size. Coronary artery calcifications and/or stents. No pericardial effusion. Mediastinum/Nodes: Significant enlargement of a left hilar lymph node, measuring 2.5 x 1.6 cm, previously 1.5 x 0.9 cm, and a large subcarinal lymph node, measuring 3.7 x 2.0 cm, previously 3.2 x 1.6 cm (series 2, image 28, 34). Other nodes are unchanged. Thyroid gland, trachea, and esophagus demonstrate no significant findings. Lungs/Pleura: Mild centrilobular and paraseptal emphysema. Redemonstrated spiculated mass of the apical right upper lobe, increased in size compared to prior examination  measuring 3.3 x 2.9 cm, previously 2.7 x 2.4 cm when measured similarly (series 3, image 36). Slight interval enlargement of an adjacent spiculated nodule of the medial right apex, measuring 0.8 cm, previously 0.6 cm (series 3, image 25). Small right pleural effusion and associated atelectasis or consolidation, not significantly changed compared to prior examination. Benign calcified nodules of the left lower lobe and right middle lobe are unchanged. Musculoskeletal: No chest wall mass or suspicious bone lesions identified. CT ABDOMEN PELVIS FINDINGS Hepatobiliary: Cirrhotic morphology of the liver. Gallstones. No gallbladder wall thickening, or biliary dilatation. Pancreas: Unremarkable. No pancreatic ductal dilatation or surrounding inflammatory changes. Spleen: Normal in size without significant abnormality. Adrenals/Urinary Tract: Adrenal glands are unremarkable.  Kidneys are normal, without renal calculi, solid lesion, or hydronephrosis. Bladder is unremarkable. Stomach/Bowel: Esophageal varices. Stomach is within normal limits. Appendix is surgically absent no evidence of bowel wall thickening, distention, or inflammatory changes. Sigmoid diverticulosis without evidence of acute diverticulitis. Large burden of stool throughout the colon. Vascular/Lymphatic: Aortic atherosclerosis. Unchanged prominent retroperitoneal lymph nodes, not previously FDG avid (series 2, image 75). Reproductive: No mass or other abnormality. Other: No abdominal wall hernia or abnormality. No abdominopelvic ascites. Musculoskeletal: No acute or significant osseous findings. IMPRESSION: 1. Interval enlargement of spiculated mass of the apical right upper lobe and adjacent spiculated nodule of the medial right apex, previously FDG avid and consistent with worsened malignancy. 2. Interval enlargement of left hilar and subcarinal lymphadenopathy, likewise previously FDG avid and consistent with worsened nodal metastatic disease. 3. Unchanged small right pleural effusion and associated atelectasis or consolidation. 4. Stable small right pleural effusion, presumably malignant, and associated atelectasis or consolidation. 5. No evidence of distant metastatic disease within the abdomen or pelvis. 6.  Emphysema (ICD10-J43.9). 7. Coronary artery disease.  Aortic Atherosclerosis (ICD10-I70.0). 8. Hepatic cirrhosis and esophageal varices. 9. Cholelithiasis. Electronically Signed   By: Eddie Candle M.D.   On: 03/30/2019 14:26   CT Biopsy  Result Date: 04/12/2019 INDICATION: 74 year old male with a history of squamous cell carcinoma of the esophagus and an enlarging hypermetabolic right upper lobe pulmonary mass concerning for either esophageal metastatic disease or a synchronous primary bronchogenic carcinoma. EXAM: CT-guided biopsy right upper lobe pulmonary nodule Interventional Radiologist:  Criselda Peaches, MD MEDICATIONS: None. ANESTHESIA/SEDATION: Fentanyl 25 mcg IV; Versed 0.5 mg IV Moderate Sedation Time:  13 minutes The patient was continuously monitored during the procedure by the interventional radiology nurse under my direct supervision. FLUOROSCOPY TIME:  None. COMPLICATIONS: None immediate. Estimated blood loss:  0 PROCEDURE: Informed written consent was obtained from the patient after a thorough discussion of the procedural risks, benefits and alternatives. All questions were addressed. Maximal Sterile Barrier Technique was utilized including caps, mask, sterile gowns, sterile gloves, sterile drape, hand hygiene and skin antiseptic. A timeout was performed prior to the initiation of the procedure. A planning axial CT scan was performed. The nodule in the right upper lobe was successfully identified. A suitable skin entry site was selected and marked. The region was then sterilely prepped and draped in standard fashion using Betadine skin prep. Local anesthesia was attained by infiltration with 1% lidocaine. A small dermatotomy was made. Under intermittent CT fluoroscopic guidance, a 17 gauge trocar needle was advanced into the lung and positioned at the margin of the nodule. Multiple 18 gauge core biopsies were then coaxially obtained using the BioPince automated biopsy device. Biopsy specimens were placed in formalin and delivered to pathology for further analysis. The biopsy device and introducer  needle were removed. A bio sentry device was deployed. Post biopsy axial CT imaging demonstrates no evidence of immediate complication. There is no pneumothorax. Mild perilesional alveolar hemorrhage is not unexpected. The patient tolerated the procedure well. IMPRESSION: Technically successful CT-guided biopsy right upper lobe pulmonary nodule. Electronically Signed   By: Jacqulynn Cadet M.D.   On: 04/12/2019 17:24   DG Chest Port 1 View  Result Date: 04/12/2019 CLINICAL DATA:  74 year old male  status post biopsy of right upper lobe pulmonary mass EXAM: PORTABLE CHEST 1 VIEW COMPARISON:  CT scan of the chest 03/30/2019 FINDINGS: No evidence of pneumothorax. Right upper lobe nodular opacity consistent with known underlying pulmonary mass. Haziness surrounding the nodule consistent with mild alveolar hemorrhage. Cardiac and mediastinal contours remain unchanged and within normal limits. No acute osseous abnormality. IMPRESSION: No evidence of pneumothorax following right upper lobe pulmonary mass biopsy. Electronically Signed   By: Jacqulynn Cadet M.D.   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  PLAN:    1.  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  Biopsy confirmed this is a second primary of adenocarcinoma of lung origin.  Prior to initiating treatment, patient will require repeat PET scan as well as brain MRI to complete staging work-up.  He will likely receive concurrent chemotherapy with weekly carboplatinum and Taxol along with daily XRT followed by maintenance durvalumab.  Return to clinic in approximately 2 weeks to discuss his imaging results and treatment planning.   2. 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  No obvious evidence of recurrence.   3. Social situation: Because patient is a ward of the state, he will require his power of attorney to be present for any further consents.  I spent a total of 30 minutes reviewing chart data, face-to-face evaluation with the patient, counseling and coordination of care as detailed above.    Patient expressed understanding and was in agreement  with this plan. He also understands that He can call clinic at any time with any questions, concerns, or complaints.    Cancer Staging Primary cancer of larynx [facility]) Staging form: Larynx - Supraglottis, AJCC 8th Edition - Clinical stage from 02/27/2016: Stage IVA (cT1, cN2b, cM0) - Signed by Lloyd Huger, MD on 02/27/2016   Lloyd Huger, MD 04/21/19 6:22 AM

## 2019-04-17 ENCOUNTER — Encounter: Payer: Self-pay | Admitting: *Deleted

## 2019-04-20 ENCOUNTER — Encounter: Payer: Self-pay | Admitting: *Deleted

## 2019-04-20 ENCOUNTER — Ambulatory Visit: Payer: Medicare Other

## 2019-04-20 ENCOUNTER — Other Ambulatory Visit: Payer: Medicare Other

## 2019-04-20 ENCOUNTER — Encounter: Payer: Self-pay | Admitting: Oncology

## 2019-04-20 ENCOUNTER — Inpatient Hospital Stay: Payer: Medicare Other | Attending: Oncology | Admitting: Oncology

## 2019-04-20 ENCOUNTER — Other Ambulatory Visit: Payer: Self-pay

## 2019-04-20 VITALS — BP 120/80 | HR 97 | Temp 97.0°F | Wt 134.9 lb

## 2019-04-20 DIAGNOSIS — C3490 Malignant neoplasm of unspecified part of unspecified bronchus or lung: Secondary | ICD-10-CM | POA: Diagnosis not present

## 2019-04-20 DIAGNOSIS — R918 Other nonspecific abnormal finding of lung field: Secondary | ICD-10-CM | POA: Insufficient documentation

## 2019-04-20 DIAGNOSIS — C329 Malignant neoplasm of larynx, unspecified: Secondary | ICD-10-CM | POA: Diagnosis present

## 2019-04-20 DIAGNOSIS — Z8673 Personal history of transient ischemic attack (TIA), and cerebral infarction without residual deficits: Secondary | ICD-10-CM | POA: Insufficient documentation

## 2019-04-20 DIAGNOSIS — F419 Anxiety disorder, unspecified: Secondary | ICD-10-CM | POA: Insufficient documentation

## 2019-04-20 DIAGNOSIS — C7951 Secondary malignant neoplasm of bone: Secondary | ICD-10-CM | POA: Diagnosis not present

## 2019-04-20 DIAGNOSIS — K7460 Unspecified cirrhosis of liver: Secondary | ICD-10-CM | POA: Insufficient documentation

## 2019-04-20 DIAGNOSIS — Z9119 Patient's noncompliance with other medical treatment and regimen: Secondary | ICD-10-CM | POA: Insufficient documentation

## 2019-04-20 DIAGNOSIS — Z79899 Other long term (current) drug therapy: Secondary | ICD-10-CM | POA: Diagnosis not present

## 2019-04-20 DIAGNOSIS — Z8711 Personal history of peptic ulcer disease: Secondary | ICD-10-CM | POA: Insufficient documentation

## 2019-04-20 DIAGNOSIS — Z803 Family history of malignant neoplasm of breast: Secondary | ICD-10-CM | POA: Diagnosis not present

## 2019-04-20 DIAGNOSIS — F17210 Nicotine dependence, cigarettes, uncomplicated: Secondary | ICD-10-CM | POA: Insufficient documentation

## 2019-04-20 DIAGNOSIS — R339 Retention of urine, unspecified: Secondary | ICD-10-CM | POA: Insufficient documentation

## 2019-04-20 DIAGNOSIS — J449 Chronic obstructive pulmonary disease, unspecified: Secondary | ICD-10-CM | POA: Diagnosis not present

## 2019-04-20 DIAGNOSIS — Z5112 Encounter for antineoplastic immunotherapy: Secondary | ICD-10-CM | POA: Insufficient documentation

## 2019-04-20 DIAGNOSIS — K7010 Alcoholic hepatitis without ascites: Secondary | ICD-10-CM | POA: Insufficient documentation

## 2019-04-20 DIAGNOSIS — Z87898 Personal history of other specified conditions: Secondary | ICD-10-CM | POA: Insufficient documentation

## 2019-04-20 DIAGNOSIS — Z8719 Personal history of other diseases of the digestive system: Secondary | ICD-10-CM | POA: Insufficient documentation

## 2019-04-20 DIAGNOSIS — S02609A Fracture of mandible, unspecified, initial encounter for closed fracture: Secondary | ICD-10-CM | POA: Insufficient documentation

## 2019-04-20 DIAGNOSIS — N400 Enlarged prostate without lower urinary tract symptoms: Secondary | ICD-10-CM | POA: Insufficient documentation

## 2019-04-20 DIAGNOSIS — B1920 Unspecified viral hepatitis C without hepatic coma: Secondary | ICD-10-CM | POA: Insufficient documentation

## 2019-04-20 DIAGNOSIS — Z9181 History of falling: Secondary | ICD-10-CM | POA: Insufficient documentation

## 2019-04-20 DIAGNOSIS — Z720 Tobacco use: Secondary | ICD-10-CM | POA: Insufficient documentation

## 2019-04-20 DIAGNOSIS — Z5111 Encounter for antineoplastic chemotherapy: Secondary | ICD-10-CM | POA: Insufficient documentation

## 2019-04-20 DIAGNOSIS — Z91199 Patient's noncompliance with other medical treatment and regimen due to unspecified reason: Secondary | ICD-10-CM | POA: Insufficient documentation

## 2019-04-20 DIAGNOSIS — L989 Disorder of the skin and subcutaneous tissue, unspecified: Secondary | ICD-10-CM | POA: Insufficient documentation

## 2019-04-20 DIAGNOSIS — F1491 Cocaine use, unspecified, in remission: Secondary | ICD-10-CM | POA: Insufficient documentation

## 2019-04-20 NOTE — Progress Notes (Signed)
Pt here for follow up to get biopsy results. No complaints or concerns.

## 2019-04-20 NOTE — Progress Notes (Signed)
  Oncology Nurse Navigator Documentation  Navigator Location: [facility]Med Onc (04/20/19 1500)   )Navigator Encounter Type: Diagnostic Results;Follow-up Appt (04/20/19 1500)                     Patient Visit Type: MedOnc (04/20/19 1500) Treatment Phase: Pre-Tx/Tx Discussion (04/20/19 1500) Barriers/Navigation Needs: Coordination of Care (04/20/19 1500)   Interventions: Coordination of Care (04/20/19 1500)   Coordination of Care: Appts;Radiology (04/20/19 1500)        Acuity: Level 3-Moderate Needs (3-4 Barriers Identified) (04/20/19 1500)      met with patient and his caretaker during follow up visit with Dr. Grayland Ormond to discuss biopsy results and treatment options. All questions answered during visit. Reviewed upcoming appts with pt and his caretaker. Per caretaker, unable to keep appt for brain MRI on 3/4 at 7pm and requests for MRI to be scheduled during the day due to transportation issues. MRI has been rescheduled to 3/5 at 12pm but unable to get in touch pt's caretaker to inform of appt change. Will continue to try to contact pt's caretaker. All other appts reviewed at the time of visit. Contact info given and instructed to call with any further questions or needs. Pt and his caretaker verbalized understanding. Nothing further needed at this time.    Time Spent with Patient: 60 (04/20/19 1500)

## 2019-04-20 NOTE — Progress Notes (Addendum)
Tumor Board Documentation  [name] was presented by Dr Grayland Ormond at our Tumor Board on 04/20/2019, which included representatives from medical oncology, radiation oncology, navigation, pathology, radiology, surgical, surgical oncology, internal medicine, pharmacy, genetics, palliative care, research.  [name] currently presents as a current patient, for [name], for new positive pathology with history of the following treatments: surgical intervention(s), neoadjuvant radiation, active survellience.  Additionally, we reviewed previous medical and familial history, history of present illness, and recent lab results along with all available histopathologic and imaging studies. The tumor board considered available treatment options and made the following recommendations: Complete staging with PET and brain MRI.  Concurrent carboplatinum and Taxol with XRT followed by maintenance immunotherapy    The following procedures/referrals were also placed: No orders of the defined types were placed in this encounter.   Clinical Trial Status: not discussed   Staging used: To be determined  AJCC Staging:       Group: Stage 3-4 Adenocarcinoma of Lung   National site-specific guidelines   were discussed with respect to the case.  Tumor board is a meeting of clinicians from various specialty areas who evaluate and discuss patients for whom a multidisciplinary approach is being considered. Final determinations in the plan of care are those of the provider(s). The responsibility for follow up of recommendations given during tumor board is that of the provider.   Today's extended care, comprehensive team conference, [name] was not present for the discussion and was not examined.   Multidisciplinary Tumor Board is a multidisciplinary case peer review process.  Decisions discussed in the Multidisciplinary Tumor Board reflect the opinions of the specialists present at the conference without having examined the  patient.  Ultimately, treatment and diagnostic decisions rest with the primary provider(s) and the patient.

## 2019-04-21 ENCOUNTER — Ambulatory Visit: Payer: Medicare Other

## 2019-04-21 ENCOUNTER — Telehealth: Payer: Self-pay | Admitting: *Deleted

## 2019-04-21 NOTE — Telephone Encounter (Signed)
Spoke with Cecelia at group home for patient to inform of new appt for brain MRI. Informed that MRI has been rescheduled to today at 12p per her request. Cecelia stated that she was not available today to bring pt and requested for MRI be rescheduled again. New appt for MRI given for 3/15 at 8am and instructed [facility]. Also informed that pt has been scheduled for port placement on 3/12 at 1230p with 1130am [facility] the [facility] mall. All appts were faxed to [facility] at [phone] which included instructions for each appt. Instructed Cecelia to call back if has any further questions or needs. She verbalized understanding. Nothing further needed at this time.

## 2019-04-23 NOTE — Progress Notes (Deleted)
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  INTERVAL HISTORY: Patient returns to clinic today for further evaluation and discussion of his biopsy results.  He continues to feel well and remains asymptomatic. Much of his history is given by his caretaker.  He continues to smoke heavily.  He has no neurologic complaints.  He denies any recent fevers or illnesses.  He has a fair appetite, but denies weight loss.  He has no chest pain, shortness of breath, cough, or hemoptysis.  He denies any nausea, vomiting, constipation, or diarrhea.  He has no urinary complaints.  Patient offers no specific complaints today.  REVIEW OF SYSTEMS:    Review of Systems  Constitutional: Negative.  Negative for fever, malaise/fatigue and weight loss.  HENT: Negative.  Negative for sore throat.   Respiratory: Negative.  Negative for cough and shortness of breath.   Cardiovascular: Negative.  Negative for chest pain and leg swelling.  Gastrointestinal: Negative.  Negative for abdominal pain.  Genitourinary: Negative.  Negative for dysuria.  Musculoskeletal: Negative.  Negative for back pain.  Skin: Negative.  Negative for rash.  Neurological: Negative.  Negative for dizziness, sensory change, weakness and headaches.  Psychiatric/Behavioral: Negative.  Negative for substance abuse. The patient does not have insomnia.     As per HPI. Otherwise, a complete review of systems is negative.  PAST MEDICAL HISTORY: Past Medical History:  Diagnosis Date  . Alcohol abuse   . Cirrhosis ([place])   . COPD (chronic obstructive pulmonary disease) ([place])     . Dementia ([place])   . Hepatitis C   . Malnutrition ([place])   . Throat cancer ([place])     PAST SURGICAL HISTORY: Past Surgical History:  Procedure Laterality Date  . TONSILLECTOMY     age was 74  . VASECTOMY      FAMILY HISTORY: Family History  Problem Relation Age of Onset  . Breast cancer Mother     ADVANCED DIRECTIVES (Y/N):  N  HEALTH MAINTENANCE: Social History   Tobacco Use  . Smoking status: Current Every Day Smoker    Packs/day: 0.50    Years: 63.00    Pack years: 31.50    Types: Cigarettes  . Smokeless tobacco: Never Used  Substance Use Topics  . Alcohol use: Not Currently    Alcohol/week: 1.0 standard drinks    Types: 1 Cans of beer per week  . Drug use: No     Colonoscopy:  PAP:  Bone density:  Lipid panel:  Allergies  Allergen Reactions  . Penicillins Other (See Comments)    Reaction: unknown Patient is not able to answer follow up questions    Current Outpatient Medications  Medication Sig Dispense Refill  . alum & mag hydroxide-simeth (MAALOX/MYLANTA) 200-200-20 MG/5ML suspension Take 15 mLs by mouth as needed for indigestion or heartburn. (Patient not taking: Reported on 02/22/2017) 355 mL 0  . buPROPion (WELLBUTRIN SR) 150 MG 12 hr tablet bupropion HCl SR 150 mg tablet,12 hr sustained-release    . Cholecalciferol (VITAMIN D3) 5000 units TABS Take 5,000 Units by mouth daily.    Marland Kitchen docusate (COLACE) 50 MG/5ML liquid Take by mouth daily.    Marland Kitchen  ferrous sulfate 325 (65 FE) MG tablet Take 325 mg by mouth daily with breakfast.    . folic acid (FOLVITE) 1 MG tablet Take 1 mg by mouth daily.    . hydrOXYzine (ATARAX/VISTARIL) 25 MG tablet     . lactose free nutrition (BOOST PLUS) LIQD Take 237 mLs by mouth 4 (four) times daily.    Marland Kitchen levothyroxine (SYNTHROID) 100 MCG tablet Take 100 mcg by mouth daily before breakfast.    . losartan (COZAAR) 25 MG tablet Take 25 mg by mouth daily.    . mirtazapine (REMERON) 15 MG tablet Take 15 mg by mouth at bedtime.     . mupirocin ointment (BACTROBAN) 2 % Apply 1 application topically 2 (two) times daily.    . ondansetron (ZOFRAN) 8 MG tablet Take by mouth every 8 (eight) hours as needed for nausea or vomiting.    . polyethylene glycol (MIRALAX / GLYCOLAX) 17 g packet Take 17 g by mouth daily.    . QUEtiapine (SEROQUEL) 25 MG tablet Take 25 mg by mouth 3 (three) times daily.    . QUEtiapine (SEROQUEL) 50 MG tablet Take 1 tablet (50 mg total) by mouth at bedtime. 30 tablet 0  . tamsulosin (FLOMAX) 0.4 MG CAPS capsule Take 0.4 mg by mouth 2 (two) times daily.     Marland Kitchen thiamine 100 MG tablet Take 100 mg by mouth daily.    . TRELEGY ELLIPTA 100-62.5-25 MCG/INH AEPB     . vitamin C (ASCORBIC ACID) 250 MG tablet Take 250 mg by mouth 2 (two) times daily.     No current facility-administered medications for this visit.   Facility-Administered Medications Ordered in Other Visits  Medication Dose Route Frequency Provider Last Rate Last Admin  . heparin lock flush 100 unit/mL  500 Units Intravenous Once Grayland Ormond Kathlene November, MD        OBJECTIVE: There were no vitals filed for this visit.   There is no height or weight on file to calculate BMI.    ECOG FS:0 - Asymptomatic  General: Thin, no acute distress. Eyes: Pink conjunctiva, anicteric sclera. HEENT: Normocephalic, moist mucous membranes. Lungs: No audible wheezing or coughing. Heart: Regular rate and rhythm. Abdomen: Soft, nontender, no obvious distention. Musculoskeletal: No edema, cyanosis, or clubbing. Neuro: Alert, answering all questions appropriately. Cranial nerves grossly intact. Skin: No rashes or petechiae noted. Psych: Normal affect.   LAB RESULTS:  Lab Results  Component Value Date   NA 134 (L) 11/12/2016   K 4.2 11/12/2016   CL 100 (L) 11/12/2016   CO2 28 11/12/2016   GLUCOSE 80 11/12/2016   BUN 11 11/12/2016   CREATININE 1.07 03/30/2019   CALCIUM 9.2 11/12/2016   PROT 7.2 11/12/2016   ALBUMIN 3.4 (L) 11/12/2016   AST 72 (H)  11/12/2016   ALT 71 (H) 11/12/2016   ALKPHOS 89 11/12/2016   BILITOT 0.7 11/12/2016   GFRNONAA >60 03/30/2019   GFRAA >60 03/30/2019    Lab Results  Component Value Date   WBC 5.2 04/12/2019   NEUTROABS 3.4 08/05/2016   HGB 11.0 (L) 04/12/2019   HCT 32.5 (L) 04/12/2019   MCV 95.9 04/12/2019   PLT 141 (L) 04/12/2019     STUDIES: CT Chest W Contrast  Result Date: 03/30/2019 CLINICAL DATA:  Follow-up lung cancer EXAM: CT CHEST, ABDOMEN, AND PELVIS WITH CONTRAST TECHNIQUE: Multidetector CT imaging of the chest, abdomen and pelvis was performed following the standard protocol during bolus administration of intravenous contrast. CONTRAST:  119mL OMNIPAQUE IOHEXOL  300 MG/ML SOLN, additional oral enteric contrast COMPARISON:  PET-CT, 11/21/2018 FINDINGS: CT CHEST FINDINGS Cardiovascular: Aortic atherosclerosis. Normal heart size. Coronary artery calcifications and/or stents. No pericardial effusion. Mediastinum/Nodes: Significant enlargement of a left hilar lymph node, measuring 2.5 x 1.6 cm, previously 1.5 x 0.9 cm, and a large subcarinal lymph node, measuring 3.7 x 2.0 cm, previously 3.2 x 1.6 cm (series 2, image 28, 34). Other nodes are unchanged. Thyroid gland, trachea, and esophagus demonstrate no significant findings. Lungs/Pleura: Mild centrilobular and paraseptal emphysema. Redemonstrated spiculated mass of the apical right upper lobe, increased in size compared to prior examination measuring 3.3 x 2.9 cm, previously 2.7 x 2.4 cm when measured similarly (series 3, image 36). Slight interval enlargement of an adjacent spiculated nodule of the medial right apex, measuring 0.8 cm, previously 0.6 cm (series 3, image 25). Small right pleural effusion and associated atelectasis or consolidation, not significantly changed compared to prior examination. Benign calcified nodules of the left lower lobe and right middle lobe are unchanged. Musculoskeletal: No chest wall mass or suspicious bone lesions  identified. CT ABDOMEN PELVIS FINDINGS Hepatobiliary: Cirrhotic morphology of the liver. Gallstones. No gallbladder wall thickening, or biliary dilatation. Pancreas: Unremarkable. No pancreatic ductal dilatation or surrounding inflammatory changes. Spleen: Normal in size without significant abnormality. Adrenals/Urinary Tract: Adrenal glands are unremarkable. Kidneys are normal, without renal calculi, solid lesion, or hydronephrosis. Bladder is unremarkable. Stomach/Bowel: Esophageal varices. Stomach is within normal limits. Appendix is surgically absent no evidence of bowel wall thickening, distention, or inflammatory changes. Sigmoid diverticulosis without evidence of acute diverticulitis. Large burden of stool throughout the colon. Vascular/Lymphatic: Aortic atherosclerosis. Unchanged prominent retroperitoneal lymph nodes, not previously FDG avid (series 2, image 75). Reproductive: No mass or other abnormality. Other: No abdominal wall hernia or abnormality. No abdominopelvic ascites. Musculoskeletal: No acute or significant osseous findings. IMPRESSION: 1. Interval enlargement of spiculated mass of the apical right upper lobe and adjacent spiculated nodule of the medial right apex, previously FDG avid and consistent with worsened malignancy. 2. Interval enlargement of left hilar and subcarinal lymphadenopathy, likewise previously FDG avid and consistent with worsened nodal metastatic disease. 3. Unchanged small right pleural effusion and associated atelectasis or consolidation. 4. Stable small right pleural effusion, presumably malignant, and associated atelectasis or consolidation. 5. No evidence of distant metastatic disease within the abdomen or pelvis. 6.  Emphysema (ICD10-J43.9). 7. Coronary artery disease.  Aortic Atherosclerosis (ICD10-I70.0). 8. Hepatic cirrhosis and esophageal varices. 9. Cholelithiasis. Electronically Signed   By: Eddie Candle M.D.   On: 03/30/2019 14:26   CT Abdomen Pelvis W  Contrast  Result Date: 03/30/2019 CLINICAL DATA:  Follow-up lung cancer EXAM: CT CHEST, ABDOMEN, AND PELVIS WITH CONTRAST TECHNIQUE: Multidetector CT imaging of the chest, abdomen and pelvis was performed following the standard protocol during bolus administration of intravenous contrast. CONTRAST:  132mL OMNIPAQUE IOHEXOL 300 MG/ML SOLN, additional oral enteric contrast COMPARISON:  PET-CT, 11/21/2018 FINDINGS: CT CHEST FINDINGS Cardiovascular: Aortic atherosclerosis. Normal heart size. Coronary artery calcifications and/or stents. No pericardial effusion. Mediastinum/Nodes: Significant enlargement of a left hilar lymph node, measuring 2.5 x 1.6 cm, previously 1.5 x 0.9 cm, and a large subcarinal lymph node, measuring 3.7 x 2.0 cm, previously 3.2 x 1.6 cm (series 2, image 28, 34). Other nodes are unchanged. Thyroid gland, trachea, and esophagus demonstrate no significant findings. Lungs/Pleura: Mild centrilobular and paraseptal emphysema. Redemonstrated spiculated mass of the apical right upper lobe, increased in size compared to prior examination measuring 3.3 x 2.9 cm, previously 2.7 x 2.4  cm when measured similarly (series 3, image 36). Slight interval enlargement of an adjacent spiculated nodule of the medial right apex, measuring 0.8 cm, previously 0.6 cm (series 3, image 25). Small right pleural effusion and associated atelectasis or consolidation, not significantly changed compared to prior examination. Benign calcified nodules of the left lower lobe and right middle lobe are unchanged. Musculoskeletal: No chest wall mass or suspicious bone lesions identified. CT ABDOMEN PELVIS FINDINGS Hepatobiliary: Cirrhotic morphology of the liver. Gallstones. No gallbladder wall thickening, or biliary dilatation. Pancreas: Unremarkable. No pancreatic ductal dilatation or surrounding inflammatory changes. Spleen: Normal in size without significant abnormality. Adrenals/Urinary Tract: Adrenal glands are unremarkable.  Kidneys are normal, without renal calculi, solid lesion, or hydronephrosis. Bladder is unremarkable. Stomach/Bowel: Esophageal varices. Stomach is within normal limits. Appendix is surgically absent no evidence of bowel wall thickening, distention, or inflammatory changes. Sigmoid diverticulosis without evidence of acute diverticulitis. Large burden of stool throughout the colon. Vascular/Lymphatic: Aortic atherosclerosis. Unchanged prominent retroperitoneal lymph nodes, not previously FDG avid (series 2, image 75). Reproductive: No mass or other abnormality. Other: No abdominal wall hernia or abnormality. No abdominopelvic ascites. Musculoskeletal: No acute or significant osseous findings. IMPRESSION: 1. Interval enlargement of spiculated mass of the apical right upper lobe and adjacent spiculated nodule of the medial right apex, previously FDG avid and consistent with worsened malignancy. 2. Interval enlargement of left hilar and subcarinal lymphadenopathy, likewise previously FDG avid and consistent with worsened nodal metastatic disease. 3. Unchanged small right pleural effusion and associated atelectasis or consolidation. 4. Stable small right pleural effusion, presumably malignant, and associated atelectasis or consolidation. 5. No evidence of distant metastatic disease within the abdomen or pelvis. 6.  Emphysema (ICD10-J43.9). 7. Coronary artery disease.  Aortic Atherosclerosis (ICD10-I70.0). 8. Hepatic cirrhosis and esophageal varices. 9. Cholelithiasis. Electronically Signed   By: Eddie Candle M.D.   On: 03/30/2019 14:26   CT Biopsy  Result Date: 04/12/2019 INDICATION: 74 year old male with a history of squamous cell carcinoma of the esophagus and an enlarging hypermetabolic right upper lobe pulmonary mass concerning for either esophageal metastatic disease or a synchronous primary bronchogenic carcinoma. EXAM: CT-guided biopsy right upper lobe pulmonary nodule Interventional Radiologist:  Criselda Peaches, MD MEDICATIONS: None. ANESTHESIA/SEDATION: Fentanyl 25 mcg IV; Versed 0.5 mg IV Moderate Sedation Time:  13 minutes The patient was continuously monitored during the procedure by the interventional radiology nurse under my direct supervision. FLUOROSCOPY TIME:  None. COMPLICATIONS: None immediate. Estimated blood loss:  0 PROCEDURE: Informed written consent was obtained from the patient after a thorough discussion of the procedural risks, benefits and alternatives. All questions were addressed. Maximal Sterile Barrier Technique was utilized including caps, mask, sterile gowns, sterile gloves, sterile drape, hand hygiene and skin antiseptic. A timeout was performed prior to the initiation of the procedure. A planning axial CT scan was performed. The nodule in the right upper lobe was successfully identified. A suitable skin entry site was selected and marked. The region was then sterilely prepped and draped in standard fashion using Betadine skin prep. Local anesthesia was attained by infiltration with 1% lidocaine. A small dermatotomy was made. Under intermittent CT fluoroscopic guidance, a 17 gauge trocar needle was advanced into the lung and positioned at the margin of the nodule. Multiple 18 gauge core biopsies were then coaxially obtained using the BioPince automated biopsy device. Biopsy specimens were placed in formalin and delivered to pathology for further analysis. The biopsy device and introducer needle were removed. A bio sentry device was deployed.  Post biopsy axial CT imaging demonstrates no evidence of immediate complication. There is no pneumothorax. Mild perilesional alveolar hemorrhage is not unexpected. The patient tolerated the procedure well. IMPRESSION: Technically successful CT-guided biopsy right upper lobe pulmonary nodule. Electronically Signed   By: Jacqulynn Cadet M.D.   On: 04/12/2019 17:24   DG Chest Port 1 View  Result Date: 04/12/2019 CLINICAL DATA:  74 year old male  status post biopsy of right upper lobe pulmonary mass EXAM: PORTABLE CHEST 1 VIEW COMPARISON:  CT scan of the chest 03/30/2019 FINDINGS: No evidence of pneumothorax. Right upper lobe nodular opacity consistent with known underlying pulmonary mass. Haziness surrounding the nodule consistent with mild alveolar hemorrhage. Cardiac and mediastinal contours remain unchanged and within normal limits. No acute osseous abnormality. IMPRESSION: No evidence of pneumothorax following right upper lobe pulmonary mass biopsy. Electronically Signed   By: Jacqulynn Cadet M.D.   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  PLAN:    1.  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  Biopsy confirmed this is a second primary of adenocarcinoma of lung origin.  Prior to initiating treatment, patient will require repeat PET scan as well as brain MRI to complete staging work-up.  He will likely receive concurrent chemotherapy with weekly carboplatinum and Taxol along with daily XRT followed by maintenance durvalumab.  Return to clinic in approximately 2 weeks to discuss his imaging results and treatment planning.   2. 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  No obvious evidence of recurrence.   3. Social situation: Because patient is a ward of the state, he will require his power of attorney to be present for any further consents.  I spent a total of 30 minutes reviewing chart data, face-to-face evaluation with the patient, counseling and coordination of care as detailed above.    Patient expressed understanding and was in agreement  with this plan. He also understands that He can call clinic at any time with any questions, concerns, or complaints.    Cancer Staging Primary cancer of larynx [facility]) Staging form: Larynx - Supraglottis, AJCC 8th Edition - Clinical stage from 02/27/2016: Stage IVA (cT1, cN2b, cM0) - Signed by Lloyd Huger, MD on 02/27/2016   Lloyd Huger, MD 04/23/19 6:46 PM

## 2019-04-26 ENCOUNTER — Other Ambulatory Visit: Payer: Self-pay | Admitting: Radiology

## 2019-04-26 ENCOUNTER — Other Ambulatory Visit
Admission: RE | Admit: 2019-04-26 | Discharge: 2019-04-26 | Disposition: A | Discharge status: Home / Self Care | Payer: Medicare Other | Source: Ambulatory Visit | Attending: Oncology | Admitting: Oncology

## 2019-04-26 ENCOUNTER — Ambulatory Visit
Admission: RE | Admit: 2019-04-26 | Discharge: 2019-04-26 | Disposition: A | Discharge status: Home / Self Care | Payer: Medicare Other | Source: Ambulatory Visit | Attending: Oncology | Admitting: Oncology

## 2019-04-26 ENCOUNTER — Encounter: Payer: Self-pay | Admitting: Radiation Oncology

## 2019-04-26 ENCOUNTER — Other Ambulatory Visit: Payer: Self-pay

## 2019-04-26 DIAGNOSIS — Z8673 Personal history of transient ischemic attack (TIA), and cerebral infarction without residual deficits: Secondary | ICD-10-CM | POA: Diagnosis not present

## 2019-04-26 DIAGNOSIS — J320 Chronic maxillary sinusitis: Secondary | ICD-10-CM | POA: Diagnosis not present

## 2019-04-26 DIAGNOSIS — Z20822 Contact with and (suspected) exposure to covid-19: Secondary | ICD-10-CM | POA: Insufficient documentation

## 2019-04-26 DIAGNOSIS — C7951 Secondary malignant neoplasm of bone: Secondary | ICD-10-CM | POA: Insufficient documentation

## 2019-04-26 DIAGNOSIS — C3490 Malignant neoplasm of unspecified part of unspecified bronchus or lung: Secondary | ICD-10-CM | POA: Insufficient documentation

## 2019-04-26 LAB — GLUCOSE, CAPILLARY: Glucose-Capillary: 94 mg/dL (ref 70–99)

## 2019-04-26 LAB — SARS CORONAVIRUS 2 (TAT 6-24 HRS): SARS Coronavirus 2: NEGATIVE

## 2019-04-26 MED ORDER — FLUDEOXYGLUCOSE F - 18 (FDG) INJECTION
6.9000 | Freq: Once | INTRAVENOUS | Status: AC | PRN
  Administered 2019-04-26: 7.262 via INTRAVENOUS

## 2019-04-26 NOTE — Progress Notes (Signed)
Informations for chart given by caretaker.

## 2019-04-27 ENCOUNTER — Ambulatory Visit
Admission: RE | Admit: 2019-04-27 | Discharge: 2019-04-27 | Disposition: A | Discharge status: Home / Self Care | Payer: Medicare Other | Source: Ambulatory Visit | Attending: Radiation Oncology | Admitting: Radiation Oncology

## 2019-04-27 ENCOUNTER — Other Ambulatory Visit: Payer: Self-pay | Admitting: Radiology

## 2019-04-27 ENCOUNTER — Inpatient Hospital Stay: Payer: Medicare Other | Admitting: Oncology

## 2019-04-27 DIAGNOSIS — Z510 Encounter for antineoplastic radiation therapy: Secondary | ICD-10-CM | POA: Insufficient documentation

## 2019-04-27 DIAGNOSIS — C3411 Malignant neoplasm of upper lobe, right bronchus or lung: Secondary | ICD-10-CM | POA: Insufficient documentation

## 2019-04-27 DIAGNOSIS — C7951 Secondary malignant neoplasm of bone: Secondary | ICD-10-CM | POA: Insufficient documentation

## 2019-04-27 DIAGNOSIS — C3491 Malignant neoplasm of unspecified part of right bronchus or lung: Secondary | ICD-10-CM

## 2019-04-27 NOTE — Progress Notes (Signed)
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    INTERVAL HISTORY: Patient returns to clinic today for further evaluation and treatment planning.  He continues to feel well and remains asymptomatic. Much of his history is given by his caretaker.  He continues to smoke heavily.  He has no neurologic complaints.  He denies any recent fevers or illnesses.  He has a fair appetite, but denies weight loss.  He denies any pain.  He has no chest pain, shortness of breath, cough, or hemoptysis.  He denies any nausea, vomiting, constipation, or diarrhea.  He has no urinary complaints.  Patient offers no specific complaints today.  REVIEW OF SYSTEMS:    Review of Systems  Constitutional: Negative.  Negative for fever, malaise/fatigue and weight loss.  HENT: Negative.  Negative for sore throat.   Respiratory: Negative.  Negative for cough and shortness of breath.   Cardiovascular: Negative.  Negative for chest pain and leg swelling.  Gastrointestinal: Negative.  Negative for abdominal pain.  Genitourinary: Negative.  Negative for dysuria.  Musculoskeletal: Negative.  Negative for back pain.  Skin: Negative.  Negative for rash.  Neurological: Negative.  Negative for dizziness, sensory change, weakness and headaches.  Psychiatric/Behavioral: Negative.  Negative for substance abuse. The patient does not have insomnia.     As per HPI. Otherwise, a complete review of systems is negative.  PAST MEDICAL HISTORY: Past Medical History:  Diagnosis Date  . Alcohol abuse   . Cirrhosis ([place])   . COPD (chronic obstructive pulmonary disease) ([place])   . Dementia ([place])   . Hepatitis C   .  Malnutrition ([place])   . Throat cancer ([place])     PAST SURGICAL HISTORY: Past Surgical History:  Procedure Laterality Date  . IR IMAGING GUIDED PORT INSERTION  04/28/2019  . TONSILLECTOMY     age was 57  . VASECTOMY      FAMILY HISTORY: Family History  Problem Relation Age of Onset  . Breast cancer Mother     ADVANCED DIRECTIVES (Y/N):  N  HEALTH MAINTENANCE: Social History   Tobacco Use  . Smoking status: Former Smoker    Packs/day: 0.50    Years: 63.00    Pack years: 31.50    Types: Cigarettes  . Smokeless tobacco: Never Used  Substance Use Topics  . Alcohol use: Not Currently    Alcohol/week: 1.0 standard drinks    Types: 1 Cans of beer per week  . Drug use: No     Colonoscopy:  PAP:  Bone density:  Lipid panel:  Allergies  Allergen Reactions  . Penicillins Other (See Comments)    Reaction: unknown Patient is not able to answer follow up questions    Current Outpatient Medications  Medication Sig Dispense Refill  . alum & mag hydroxide-simeth (MAALOX/MYLANTA) 200-200-20 MG/5ML suspension Take 15 mLs by mouth as needed for indigestion or heartburn. 355 mL 0  . buPROPion (WELLBUTRIN SR) 150 MG 12 hr tablet bupropion HCl SR 150 mg tablet,12 hr sustained-release    . docusate (COLACE) 50 MG/5ML liquid Take 100 mg by mouth daily.     . ferrous sulfate 325 (65 FE) MG tablet Take 325 mg by mouth daily with breakfast.    .  folic acid (FOLVITE) 1 MG tablet Take 1 mg by mouth daily.    . hydrOXYzine (ATARAX/VISTARIL) 25 MG tablet Take 100 mg by mouth at bedtime.     . lactose free nutrition (BOOST PLUS) LIQD Take 237 mLs by mouth 4 (four) times daily.    Marland Kitchen levothyroxine (SYNTHROID) 100 MCG tablet Take 100 mcg by mouth daily before breakfast.    . losartan (COZAAR) 25 MG tablet Take 25 mg by mouth daily.    . mirtazapine (REMERON) 15 MG tablet Take 15 mg by mouth at bedtime.    . mupirocin ointment (BACTROBAN) 2 % Apply 1 application topically 2 (two) times daily.      . ondansetron (ZOFRAN) 8 MG tablet Take by mouth every 8 (eight) hours as needed for nausea or vomiting.    . polyethylene glycol (MIRALAX / GLYCOLAX) 17 g packet Take 17 g by mouth daily.    . QUEtiapine (SEROQUEL) 25 MG tablet Take 25 mg by mouth 3 (three) times daily.    . QUEtiapine (SEROQUEL) 50 MG tablet Take 1 tablet (50 mg total) by mouth at bedtime. (Patient taking differently: Take 25 mg by mouth 3 (three) times daily. ) 30 tablet 0  . tamsulosin (FLOMAX) 0.4 MG CAPS capsule Take 0.4 mg by mouth 2 (two) times daily.     Marland Kitchen thiamine 100 MG tablet Take 100 mg by mouth daily.    . TRELEGY ELLIPTA 100-62.5-25 MCG/INH AEPB Inhale 1 puff into the lungs in the morning.     . vitamin C (ASCORBIC ACID) 250 MG tablet Take 500 mg by mouth daily.     . Vitamin D, Ergocalciferol, (DRISDOL) 1.25 MG (50000 UNIT) CAPS capsule Take 50,000 Units by mouth every 7 (seven) days.    . Cholecalciferol (VITAMIN D3) 5000 units TABS Take 5,000 Units by mouth daily.    . folic acid (FOLVITE) 1 MG tablet Take 1 tablet (1 mg total) by mouth daily. Start 5-7 days before Alimta chemotherapy. Continue until 21 days after Alimta completed. 100 tablet 3  . lidocaine-prilocaine (EMLA) cream Apply to affected area once 30 g 3  . ondansetron (ZOFRAN) 8 MG tablet Take 1 tablet (8 mg total) by mouth 2 (two) times daily as needed (Nausea or vomiting). Start if needed on the third day after chemotherapy. 60 tablet 2  . prochlorperazine (COMPAZINE) 10 MG tablet Take 1 tablet (10 mg total) by mouth every 6 (six) hours as needed (Nausea or vomiting). 60 tablet 2   No current facility-administered medications for this visit.   Facility-Administered Medications Ordered in Other Visits  Medication Dose Route Frequency Provider Last Rate Last Admin  . heparin lock flush 100 unit/mL  500 Units Intravenous Once Lloyd Huger, MD        OBJECTIVE: Vitals:   05/02/19 0944  BP: 118/62  Pulse: 79  Temp: (!) 96.9 F (36.1 C)      Body mass index is 20.65 kg/m.    ECOG FS:0 - Asymptomatic  General: Thin, no acute distress. Eyes: Pink conjunctiva, anicteric sclera. HEENT: Normocephalic, moist mucous membranes. Lungs: No audible wheezing or coughing. Heart: Regular rate and rhythm. Abdomen: Soft, nontender, no obvious distention. Musculoskeletal: No edema, cyanosis, or clubbing. Neuro: Alert, answering all questions appropriately. Cranial nerves grossly intact. Skin: No rashes or petechiae noted. Psych: Normal affect.  LAB RESULTS:  Lab Results  Component Value Date   NA 134 (L) 11/12/2016   K 4.2 11/12/2016   CL 100 (L) 11/12/2016  CO2 28 11/12/2016   GLUCOSE 80 11/12/2016   BUN 11 11/12/2016   CREATININE 1.07 03/30/2019   CALCIUM 9.2 11/12/2016   PROT 7.2 11/12/2016   ALBUMIN 3.4 (L) 11/12/2016   AST 72 (H) 11/12/2016   ALT 71 (H) 11/12/2016   ALKPHOS 89 11/12/2016   BILITOT 0.7 11/12/2016   GFRNONAA >60 03/30/2019   GFRAA >60 03/30/2019    Lab Results  Component Value Date   WBC 5.2 04/12/2019   NEUTROABS 3.4 08/05/2016   HGB 11.0 (L) 04/12/2019   HCT 32.5 (L) 04/12/2019   MCV 95.9 04/12/2019   PLT 141 (L) 04/12/2019     STUDIES: MR Brain W Wo Contrast  Result Date: 05/01/2019 CLINICAL DATA:  Lung cancer. Screening EXAM: MRI HEAD WITHOUT AND WITH CONTRAST TECHNIQUE: Multiplanar, multiecho pulse sequences of the brain and surrounding structures were obtained without and with intravenous contrast. CONTRAST:  23m GADAVIST GADOBUTROL 1 MMOL/ML IV SOLN COMPARISON:  Head CT September 30, 2016 and with FINDINGS: Brain: No acute infarction, hemorrhage, hydrocephalus or mass lesion. Area encephalomalacia and gliosis involving the right temporal lobe, insula, basal ganglia and frontal lobe consistent with old right MCA territory infarct with associated retraction of the right lateral ventricle. Scattered and confluent foci of T2 hyperintensity are seen within the white matter of the cerebral  hemispheres and within the pons, nonspecific, most likely related to chronic small vessel ischemia. 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 Focal parafalcine dural thickening with heterogeneous contrast enhancement is noted in the right frontal region (series 18, image 35) measuring up to 10 mm. 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 Vascular: Normal flow voids. Skull and upper cervical spine: Normal marrow signal. Sinuses/Orbits: Mucosal thickening of the bilateral maxillary sinuses and ethmoid cells. The orbits are maintained. Other: None. IMPRESSION: 1. No evidence of intracranial metastatic disease. 2. 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 3. Encephalomalacia and gliosis involving the right temporal lobe, insula, basal ganglia and frontal lobe consistent with old right MCA territory infarct. 4. Moderate chronic small vessel ischemia. 5. Inflammatory paranasal sinus disease. Electronically Signed   By: KPedro EarlsM.D.   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 EXAM: NUCLEAR MEDICINE PET SKULL BASE TO THIGH TECHNIQUE: 7.26 mCi F-18 FDG was injected intravenously. Full-ring PET imaging was performed from the skull base to thigh after the radiotracer. CT data was obtained and used for attenuation correction and anatomic localization. Fasting blood glucose: 94 mg/dl COMPARISON:  None. Multiple prior PET examinations most recent from 11/21/2018 and CT of the chest, abdomen pelvis from 03/30/2019  FINDINGS: Mediastinal blood pool activity: SUV max 1.98 Liver activity: SUV max NA NECK: No hypermetabolic lymph nodes in the neck. Incidental CT findings: Evidence of prior right MCA infarct with encephalomalacia partially imaged on today's study. Carotid atherosclerosis bilaterally. Signs of chronic maxillary sinusitis.  This is seen on the left. CHEST: Signs of mediastinal lymphadenopathy. Largest is a bulky subcarinal lymph node measuring 1.9 cm short axis. Size is stable compared to the recent CT from February of 2021, enlarged  since the PET scan of November 21, 2018 where it measured 1.4 cm in short axis (SUVmax = 10.0) previously 9.2 (image 104, series 3) Left AP window adenopathy 1.4 cm short axis (image 94, series 3) (SUVmax = 9.8) previously 7.8, size is similar compared to the recent study from February 2021 but increased in size compared to the prior PET scan where it measured 1 cm. Decreased size of precarinal lymph nodes (image 95, series 3) 0.6 cm as compared to 1.1 cm on the previous PET scan (SUVmax = 3.5) previously 7.8 other right paratracheal lymph nodes with similar FDG avidity. Right upper lobe mass abutting the pleura measuring 2.9 x 2.8 cm. This is stable compared to the exam from February of 2021 and is increased from approximately 2.3 x 2.8 cm on the October of 2020 exam (SUVmax = 17) previously 12.9 Incidental CT findings: Small right-sided pleural effusion is unchanged. Small right apical nodule along the right superior mediastinal border is unchanged with max SUV less than background blood pool activity. This measures approximately 7 mm. Signs of background pulmonary emphysema. Subtle ground-glass at the left lung base with suggestion of nodularity (image 118, series 3) largest area measuring approximately 0.8 cm with max SUV of approximately 2.2. Is scattered areas similar to this are found in a segmental distribution. ABDOMEN/PELVIS: No abnormal hypermetabolic activity within the liver,  pancreas, adrenal glands, or spleen. No hypermetabolic lymph nodes in the abdomen or pelvis. Incidental CT findings: Liver displays a cirrhotic morphology. Signs of cholelithiasis. Spleen is normal size. The adrenal glands are normal as is the pancreas on noncontrast imaging. No signs of hydronephrosis. No signs of bowel obstruction or acute bowel process with signs of colonic diverticulosis. SKELETON: Signs of destructive lesion in the left acetabulum with associated increased FDG uptake. This measures approximately 2.2 x 1.5 cm (image 236, series 3) (SUVmax = 15.9) Incidental CT findings: Spinal degenerative changes. IMPRESSION: 1. Enlargement of the right upper lobe mass and subcarinal and AP window lymph nodes since the PET scan of October of 2020, stable since February of this year. Also with increased FDG uptake in these areas when compared to the prior PET scan. 2. Diminished size and FDG uptake within the precarinal lymph node but with stable appearance of other smaller lymph nodes in the chest. 3. 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 4. Small right pleural effusion is similar to prior study with similar pattern of uptake less than background. Attention on follow-up. 5. New signs of left lower lobe airspace disease with scattered areas of ground-glass and nodularity with segmental distribution. Findings may represent infectious or inflammatory sequela. Attention on follow-up. 6. Signs of cirrhosis. 7. Signs of previous CVA and chronic maxillary sinus disease. Electronically Signed   By: Zetta Bills M.D.   On: 04/26/2019 14:10   CT Biopsy  Result Date: 04/12/2019 INDICATION: 74 year old male with a history of squamous cell carcinoma of the esophagus and an enlarging hypermetabolic right upper lobe pulmonary mass concerning for either esophageal metastatic disease or a synchronous primary bronchogenic carcinoma. EXAM: CT-guided biopsy right upper lobe pulmonary nodule Interventional  Radiologist:  Criselda Peaches, MD MEDICATIONS: None. ANESTHESIA/SEDATION: Fentanyl 25 mcg IV; Versed 0.5 mg IV Moderate Sedation Time:  13 minutes The patient was continuously monitored during the procedure by the interventional radiology nurse under my direct supervision. FLUOROSCOPY TIME:  None. COMPLICATIONS: None immediate. Estimated blood loss:  0 PROCEDURE: Informed written consent was obtained from the patient after a thorough discussion of  the procedural risks, benefits and alternatives. All questions were addressed. Maximal Sterile Barrier Technique was utilized including caps, mask, sterile gowns, sterile gloves, sterile drape, hand hygiene and skin antiseptic. A timeout was performed prior to the initiation of the procedure. A planning axial CT scan was performed. The nodule in the right upper lobe was successfully identified. A suitable skin entry site was selected and marked. The region was then sterilely prepped and draped in standard fashion using Betadine skin prep. Local anesthesia was attained by infiltration with 1% lidocaine. A small dermatotomy was made. Under intermittent CT fluoroscopic guidance, a 17 gauge trocar needle was advanced into the lung and positioned at the margin of the nodule. Multiple 18 gauge core biopsies were then coaxially obtained using the BioPince automated biopsy device. Biopsy specimens were placed in formalin and delivered to pathology for further analysis. The biopsy device and introducer needle were removed. A bio sentry device was deployed. Post biopsy axial CT imaging demonstrates no evidence of immediate complication. There is no pneumothorax. Mild perilesional alveolar hemorrhage is not unexpected. The patient tolerated the procedure well. IMPRESSION: Technically successful CT-guided biopsy right upper lobe pulmonary nodule. Electronically Signed   By: Jacqulynn Cadet M.D.   On: 04/12/2019 17:24   DG Chest Port 1 View  Result Date: 04/12/2019 CLINICAL  DATA:  74 year old male status post biopsy of right upper lobe pulmonary mass EXAM: PORTABLE CHEST 1 VIEW COMPARISON:  CT scan of the chest 03/30/2019 FINDINGS: No evidence of pneumothorax. Right upper lobe nodular opacity consistent with known underlying pulmonary mass. Haziness surrounding the nodule consistent with mild alveolar hemorrhage. Cardiac and mediastinal contours remain unchanged and within normal limits. No acute osseous abnormality. IMPRESSION: No evidence of pneumothorax following right upper lobe pulmonary mass biopsy. Electronically Signed   By: Jacqulynn Cadet M.D.   On: 04/12/2019 16:03   IR IMAGING GUIDED PORT INSERTION  Result Date: 04/28/2019 INDICATION: History of lung cancer. In need of durable intravenous access for chemotherapy administration. EXAM: IMPLANTED PORT A CATH PLACEMENT WITH ULTRASOUND AND FLUOROSCOPIC GUIDANCE COMPARISON:  PET-CT-04/26/2019 MEDICATIONS: Vancomycin 1 gm IV; The antibiotic was administered within an appropriate time interval prior to skin puncture. ANESTHESIA/SEDATION: Moderate (conscious) sedation was employed during this procedure. A total of Versed 1 mg and Fentanyl 50 mcg was administered intravenously. Moderate Sedation Time: 25 minutes. The patient's level of consciousness and vital signs were monitored continuously by radiology nursing throughout the procedure under my direct supervision. CONTRAST:  None FLUOROSCOPY TIME:  18 seconds (1.8 mGy) COMPLICATIONS: None immediate. PROCEDURE: The procedure, risks, benefits, and alternatives were explained to the patient. Questions regarding the procedure were encouraged and answered. The patient understands and consents to the procedure. The right neck and chest were prepped with chlorhexidine in a sterile fashion, and a sterile drape was applied covering the operative field. Maximum barrier sterile technique with sterile gowns and gloves were used for the procedure. A timeout was performed prior to the  initiation of the procedure. Local anesthesia was provided with 1% lidocaine with epinephrine. After creating a small venotomy incision, a micropuncture kit was utilized to access the internal jugular vein. Real-time ultrasound guidance was utilized for vascular access including the acquisition of a permanent ultrasound image documenting patency of the accessed vessel. The microwire was utilized to measure appropriate catheter length. A subcutaneous port pocket was then created along the upper chest wall utilizing a combination of sharp and blunt dissection. The pocket was irrigated with sterile saline. A single lumen "standard  sized" power injectable port was chosen for placement. The 8 Fr catheter was tunneled from the port pocket site to the venotomy incision. The port was placed in the pocket. The external catheter was trimmed to appropriate length. At the venotomy, an 8 Fr peel-away sheath was placed over a guidewire under fluoroscopic guidance. The catheter was then placed through the sheath and the sheath was removed. Final catheter positioning was confirmed and documented with a fluoroscopic spot radiograph. The port was accessed with a Huber needle, aspirated and flushed with heparinized saline. The venotomy site was closed with an interrupted 4-0 Vicryl suture. The port pocket incision was closed with interrupted 2-0 Vicryl suture. The skin was opposed with a running subcuticular 4-0 Vicryl suture. Dermabond and Steri-strips were applied to both incisions. Dressings were applied. The patient tolerated the procedure well without immediate post procedural complication. FINDINGS: After catheter placement, the tip lies within the superior cavoatrial junction. The catheter aspirates and flushes normally and is ready for immediate use. IMPRESSION: Successful placement of a right internal jugular approach power injectable Port-A-Cath. The catheter is ready for immediate use. Electronically Signed   By: Sandi Mariscal M.D.   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    PLAN:    1.  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  Patient has a new left acetabular metastasis confirming stage IV disease.  MRI of the brain on May 01, 2019 did not reveal metastatic disease.  Ominseq  including PD-L1 is pending at time of dictation.  Patient has had port placement.  He will have consultation with radiation oncology today to discuss palliative treatment to his acetabulum.  Proceed with systemic therapy using carboplatinum, pemetrexed, and Keytruda every 3 weeks for 4 cycles, patient will then receive maintenance pemetrexed and Keytruda.  Return to clinic on May 11, 2019 to initiate cycle 1 of treatment.  2. 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  Patient received his last dose of weekly cisplatin on May 04, 2016.  3. Social situation: Patient has a legal guardian.  I spent a total of 30 minutes reviewing chart data, face-to-face evaluation with the patient, counseling and coordination of care as detailed above.   Patient expressed understanding and was in agreement with this plan. He also understands that He can call clinic at any time with any questions, concerns, or complaints.    Cancer Staging Adenocarcinoma, lung, right [facility]) Staging form: Lung, AJCC 8th Edition - Clinical stage from 05/02/2019: Stage IVB (cT1c, cN2, cM1c) - Signed by Lloyd Huger, MD on 05/02/2019  Primary cancer of larynx [facility]) Staging form: Larynx - Supraglottis, AJCC 8th Edition - Clinical stage from 02/27/2016: Stage IVA (cT1, cN2b, cM0) - Signed by Lloyd Huger, MD on  02/27/2016   Lloyd Huger, MD 05/02/19 11:13 AM

## 2019-04-28 ENCOUNTER — Ambulatory Visit: Admission: RE | Admit: 2019-04-28 | Payer: Medicare Other | Source: Ambulatory Visit

## 2019-04-28 ENCOUNTER — Other Ambulatory Visit: Payer: Self-pay

## 2019-04-28 ENCOUNTER — Ambulatory Visit
Admission: RE | Admit: 2019-04-28 | Discharge: 2019-04-28 | Disposition: A | Discharge status: Home / Self Care | Payer: Medicare Other | Source: Ambulatory Visit | Attending: Oncology | Admitting: Oncology

## 2019-04-28 DIAGNOSIS — C3490 Malignant neoplasm of unspecified part of unspecified bronchus or lung: Secondary | ICD-10-CM | POA: Insufficient documentation

## 2019-04-28 HISTORY — PX: IR IMAGING GUIDED PORT INSERTION: IMG5740

## 2019-04-28 MED ORDER — FENTANYL CITRATE (PF) 100 MCG/2ML IJ SOLN
INTRAMUSCULAR | Status: AC
  Filled 2019-04-28: qty 2

## 2019-04-28 MED ORDER — MIDAZOLAM HCL 2 MG/2ML IJ SOLN
INTRAMUSCULAR | Status: AC
  Filled 2019-04-28: qty 2

## 2019-04-28 MED ORDER — SODIUM CHLORIDE 0.9 % IV SOLN: INTRAVENOUS | Status: DC

## 2019-04-28 MED ORDER — VANCOMYCIN HCL IN DEXTROSE 1-5 GM/200ML-% IV SOLN
1000.0000 mg | INTRAVENOUS | Status: AC
  Administered 2019-04-28: 1000 mg via INTRAVENOUS
  Filled 2019-04-28: qty 200

## 2019-04-28 MED ORDER — FENTANYL CITRATE (PF) 100 MCG/2ML IJ SOLN
INTRAMUSCULAR | Status: AC | PRN
  Administered 2019-04-28: 50 ug via INTRAVENOUS

## 2019-04-28 MED ORDER — MIDAZOLAM HCL 2 MG/2ML IJ SOLN
INTRAMUSCULAR | Status: AC | PRN
  Administered 2019-04-28: 1 mg via INTRAVENOUS

## 2019-04-28 NOTE — Procedures (Signed)
Pre Procedure Dx: Lung cancer Post Procedural Dx: Same  Successful placement of right IJ approach port-a-cath with tip at the superior caval atrial junction. The catheter is ready for immediate use.  Estimated Blood Loss: Minimal  Complications: None immediate.  Jay Vashti Bolanos, MD Pager #: [phone]   

## 2019-04-28 NOTE — Progress Notes (Signed)
Vs per [place] [employer]

## 2019-05-01 ENCOUNTER — Other Ambulatory Visit: Payer: Self-pay

## 2019-05-01 ENCOUNTER — Ambulatory Visit
Admission: RE | Admit: 2019-05-01 | Discharge: 2019-05-01 | Disposition: A | Discharge status: Home / Self Care | Payer: Medicare Other | Source: Ambulatory Visit | Attending: Oncology | Admitting: Oncology

## 2019-05-01 DIAGNOSIS — C3490 Malignant neoplasm of unspecified part of unspecified bronchus or lung: Secondary | ICD-10-CM | POA: Diagnosis present

## 2019-05-01 MED ORDER — GADOBUTROL 1 MMOL/ML IV SOLN
6.0000 mL | Freq: Once | INTRAVENOUS | Status: AC | PRN
  Administered 2019-05-01: 6 mL via INTRAVENOUS

## 2019-05-02 ENCOUNTER — Inpatient Hospital Stay (HOSPITAL_BASED_OUTPATIENT_CLINIC_OR_DEPARTMENT_OTHER): Payer: Medicare Other | Admitting: Oncology

## 2019-05-02 ENCOUNTER — Encounter: Payer: Self-pay | Admitting: Oncology

## 2019-05-02 ENCOUNTER — Ambulatory Visit
Admission: RE | Admit: 2019-05-02 | Discharge: 2019-05-02 | Disposition: A | Discharge status: Home / Self Care | Payer: Medicare Other | Source: Ambulatory Visit | Attending: Radiation Oncology | Admitting: Radiation Oncology

## 2019-05-02 ENCOUNTER — Encounter: Payer: Self-pay | Admitting: *Deleted

## 2019-05-02 VITALS — BP 118/62 | HR 79 | Temp 96.9°F | Wt 133.8 lb

## 2019-05-02 DIAGNOSIS — J90 Pleural effusion, not elsewhere classified: Secondary | ICD-10-CM | POA: Diagnosis not present

## 2019-05-02 DIAGNOSIS — Z5112 Encounter for antineoplastic immunotherapy: Secondary | ICD-10-CM | POA: Diagnosis not present

## 2019-05-02 DIAGNOSIS — F0390 Unspecified dementia without behavioral disturbance: Secondary | ICD-10-CM | POA: Diagnosis not present

## 2019-05-02 DIAGNOSIS — C3490 Malignant neoplasm of unspecified part of unspecified bronchus or lung: Secondary | ICD-10-CM

## 2019-05-02 DIAGNOSIS — F17210 Nicotine dependence, cigarettes, uncomplicated: Secondary | ICD-10-CM | POA: Insufficient documentation

## 2019-05-02 DIAGNOSIS — J449 Chronic obstructive pulmonary disease, unspecified: Secondary | ICD-10-CM | POA: Insufficient documentation

## 2019-05-02 DIAGNOSIS — C3411 Malignant neoplasm of upper lobe, right bronchus or lung: Secondary | ICD-10-CM | POA: Diagnosis not present

## 2019-05-02 DIAGNOSIS — C3491 Malignant neoplasm of unspecified part of right bronchus or lung: Secondary | ICD-10-CM | POA: Diagnosis not present

## 2019-05-02 DIAGNOSIS — Z79899 Other long term (current) drug therapy: Secondary | ICD-10-CM | POA: Insufficient documentation

## 2019-05-02 DIAGNOSIS — C7951 Secondary malignant neoplasm of bone: Secondary | ICD-10-CM | POA: Diagnosis not present

## 2019-05-02 DIAGNOSIS — K7460 Unspecified cirrhosis of liver: Secondary | ICD-10-CM | POA: Insufficient documentation

## 2019-05-02 MED ORDER — FOLIC ACID 1 MG PO TABS: 1.0000 mg | ORAL_TABLET | Freq: Every day | ORAL | 3 refills | Status: DC

## 2019-05-02 MED ORDER — ONDANSETRON HCL 8 MG PO TABS: 8.0000 mg | ORAL_TABLET | Freq: Two times a day (BID) | ORAL | 2 refills | Status: DC | PRN

## 2019-05-02 MED ORDER — PROCHLORPERAZINE MALEATE 10 MG PO TABS: 10.0000 mg | ORAL_TABLET | Freq: Four times a day (QID) | ORAL | 2 refills | Status: AC | PRN

## 2019-05-02 MED ORDER — LIDOCAINE-PRILOCAINE 2.5-2.5 % EX CREA: TOPICAL_CREAM | CUTANEOUS | 3 refills | Status: DC

## 2019-05-02 NOTE — Addendum Note (Signed)
Addended by: Telford Nab on: 05/02/2019 11:13 AM   Modules accepted: Orders

## 2019-05-02 NOTE — Progress Notes (Signed)
ALERT: A disease instance has been permanently removed from this patient's pathway record and replaced with a new disease instance. Information on the new disease instance will be transmitted in a separate message.  Disease Being Removed: Head and Neck  Reason for Removal: Previous diagnosis has morphed into a different diagnosis

## 2019-05-02 NOTE — Consult Note (Signed)
NEW PATIENT EVALUATION  Name: [name]  MRN: [id number]  Date:   05/02/2019     DOB: 1945/03/06   This 74 y.o. 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  REFERRING PHYSICIAN: Remi Haggard, FNP  CHIEF COMPLAINT:  Chief Complaint  Patient presents with  . Cancer    Initial consultation    DIAGNOSIS: The encounter diagnosis was Adenocarcinoma of right lung ([place]).   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  He has no evidence of disease at that time of recurrent head and neck cancer although 1 follow-up CT scan and PET CT scan in October 2020 he was noted to have hypermetabolic right upper lobe lung mass.  PET scan demonstrated enlargement of right upper lobe mass and subcarinal AP window lymph nodes which were all hypermetabolic.  Also was noted to have a new acetabular metastasis and small right pleural effusion.  We are not sure the metastasis could be from his head and neck cancer or his lung cancer has not been biopsied.  He has been seen by medical oncology and is considered stage IV disease based on recent biopsy which was CT-guided and positive for adenocarcinoma consistent with lung origin.  He has been seen by medical oncology who is planning carboplatinum pemetrexed as well as Keytruda.  Currently PDL is pending.  Patient specifically denies left leg pain cough hemoptysis or chest tightness.  PLANNED TREATMENT REGIMEN: Palliative radiation therapy to left acetabulum  PAST MEDICAL HISTORY:  has a past medical history of Alcohol abuse, Cirrhosis ([place]), COPD (chronic obstructive pulmonary disease)  ([place]), Dementia ([place]), Hepatitis C, Malnutrition ([place]), and Throat cancer ([place]).    PAST SURGICAL HISTORY:  Past Surgical History:  Procedure Laterality Date  . IR IMAGING GUIDED PORT INSERTION  04/28/2019  . TONSILLECTOMY     age was 58  . VASECTOMY      FAMILY HISTORY: family history includes Breast cancer in his mother.  SOCIAL HISTORY:  reports that he has quit smoking. His smoking use included cigarettes. He has a 31.50 pack-year smoking history. He has never used smokeless tobacco. He reports previous alcohol use of about 1.0 standard drinks of alcohol per week. He reports that he does not use drugs.  ALLERGIES: Penicillins  MEDICATIONS:  Current Outpatient Medications  Medication Sig Dispense Refill  . alum & mag hydroxide-simeth (MAALOX/MYLANTA) 200-200-20 MG/5ML suspension Take 15 mLs by mouth as needed for indigestion or heartburn. 355 mL 0  . buPROPion (WELLBUTRIN SR) 150 MG 12 hr tablet bupropion HCl SR 150 mg tablet,12 hr sustained-release    . Cholecalciferol (VITAMIN D3) 5000 units TABS Take 5,000 Units by mouth daily.    Marland Kitchen docusate (COLACE) 50 MG/5ML liquid Take 100 mg by mouth daily.     . ferrous sulfate 325 (65 FE) MG tablet Take 325 mg by mouth daily with breakfast.    . folic acid (FOLVITE) 1 MG tablet Take 1 mg by mouth daily.    . folic acid (FOLVITE) 1 MG tablet Take 1 tablet (1 mg total) by mouth daily. Start 5-7 days before Alimta chemotherapy. Continue until 21 days after Alimta completed. 100 tablet 3  .  hydrOXYzine (ATARAX/VISTARIL) 25 MG tablet Take 100 mg by mouth at bedtime.     . lactose free nutrition (BOOST PLUS) LIQD Take 237 mLs by mouth 4 (four) times daily.    Marland Kitchen levothyroxine (SYNTHROID) 100 MCG tablet Take 100 mcg by mouth daily before breakfast.    . lidocaine-prilocaine (EMLA) cream Apply to affected area once 30 g 3  . losartan (COZAAR) 25 MG tablet Take 25 mg by mouth daily.    . mirtazapine (REMERON) 15 MG tablet Take 15 mg by mouth at  bedtime.    . mupirocin ointment (BACTROBAN) 2 % Apply 1 application topically 2 (two) times daily.    . ondansetron (ZOFRAN) 8 MG tablet Take by mouth every 8 (eight) hours as needed for nausea or vomiting.    . ondansetron (ZOFRAN) 8 MG tablet Take 1 tablet (8 mg total) by mouth 2 (two) times daily as needed (Nausea or vomiting). Start if needed on the third day after chemotherapy. 60 tablet 2  . polyethylene glycol (MIRALAX / GLYCOLAX) 17 g packet Take 17 g by mouth daily.    . prochlorperazine (COMPAZINE) 10 MG tablet Take 1 tablet (10 mg total) by mouth every 6 (six) hours as needed (Nausea or vomiting). 60 tablet 2  . QUEtiapine (SEROQUEL) 25 MG tablet Take 25 mg by mouth 3 (three) times daily.    . QUEtiapine (SEROQUEL) 50 MG tablet Take 1 tablet (50 mg total) by mouth at bedtime. (Patient taking differently: Take 25 mg by mouth 3 (three) times daily. ) 30 tablet 0  . tamsulosin (FLOMAX) 0.4 MG CAPS capsule Take 0.4 mg by mouth 2 (two) times daily.     Marland Kitchen thiamine 100 MG tablet Take 100 mg by mouth daily.    . TRELEGY ELLIPTA 100-62.5-25 MCG/INH AEPB Inhale 1 puff into the lungs in the morning.     . vitamin C (ASCORBIC ACID) 250 MG tablet Take 500 mg by mouth daily.     . Vitamin D, Ergocalciferol, (DRISDOL) 1.25 MG (50000 UNIT) CAPS capsule Take 50,000 Units by mouth every 7 (seven) days.     No current facility-administered medications for this encounter.   Facility-Administered Medications Ordered in Other Encounters  Medication Dose Route Frequency Provider Last Rate Last Admin  . heparin lock flush 100 unit/mL  500 Units Intravenous Once Lloyd Huger, MD        ECOG PERFORMANCE STATUS:  0 - Asymptomatic  REVIEW OF SYSTEMS: Patient denies any weight loss, fatigue, weakness, fever, chills or night sweats. Patient denies any loss of vision, blurred vision. Patient denies any ringing  of the ears or hearing loss. No irregular heartbeat. Patient denies heart murmur or history of  fainting. Patient denies any chest pain or pain radiating to her upper extremities. Patient denies any shortness of breath, difficulty breathing at night, cough or hemoptysis. Patient denies any swelling in the lower legs. Patient denies any nausea vomiting, vomiting of blood, or coffee ground material in the vomitus. Patient denies any stomach pain. Patient states has had normal bowel movements no significant constipation or diarrhea. Patient denies any dysuria, hematuria or significant nocturia. Patient denies any problems walking, swelling in the joints or loss of balance. Patient denies any skin changes, loss of hair or loss of weight. Patient denies any excessive worrying or anxiety or significant depression. Patient denies any problems with insomnia. Patient denies excessive thirst, polyuria, polydipsia. Patient denies any swollen glands, patient denies easy bruising or easy bleeding. Patient denies any  recent infections, allergies or URI. Patient "s visual fields have not changed significantly in recent time.  MRI of brain showed no evidence of metastatic disease.   PHYSICAL EXAM: There were no vitals taken for this visit. Range of motion lower extremities does not elicit pain.  Motor or sensory and DTR levels are equal and symmetric in upper lower extremities.  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  I would reserve radiation therapy for palliation at this time based on his response to immunotherapy and systemic chemotherapy.  Risks and benefits of treatment occluding skin reaction fatigue alteration of blood counts were discussed  with the patient.  He is a ward of the state and accompanied by caretaker today.  He does seem to comprehend my treatment plan well.  I would like to take this opportunity to thank you for allowing me to participate in the care of your patient.Noreene Filbert, MD

## 2019-05-02 NOTE — Progress Notes (Signed)
START ON PATHWAY REGIMEN - Non-Small Cell Lung     A cycle is every 21 days:     Pembrolizumab      Pemetrexed      Carboplatin   **Always confirm dose/schedule in your pharmacy ordering system**  Patient Characteristics: Stage IV Metastatic, Nonsquamous, Initial Chemotherapy/Immunotherapy, PS = 0, 1, ALK Rearrangement Negative and ROS1 Rearrangement Negative and NTRK Gene Fusion?Negative and RET Gene Fusion?Negative and EGFR Mutation Negative/Non?Sensitizing, PD-L1  Expression Positive 1-49% (TPS) / Negative / Not Tested / Awaiting Test Results and Immunotherapy Candidate Therapeutic Status: Stage IV Metastatic Histology: Nonsquamous Cell ROS1 Rearrangement Status: Negative Other Mutations/Biomarkers: No Other Actionable Mutations NTRK Gene Fusion Status: Negative PD-L1 Expression Status: PD-L1 Negative Chemotherapy/Immunotherapy LOT: Initial Chemotherapy/Immunotherapy Molecular Targeted Therapy: Not Appropriate MET Exon 14 Mutation Status: Negative RET Gene Fusion Status: Negative ALK Rearrangement Status: Negative EGFR Mutation Status: Non-Sensitizing BRAF V600E Mutation Status: Negative ECOG Performance Status: 1 Biomarker Assessment Status Confirmation: All Genomic Markers Negative or Only MET+ or BRAF+ Immunotherapy Candidate Status: Candidate for Immunotherapy Intent of Therapy: Non-Curative / Palliative Intent, Discussed with Patient

## 2019-05-02 NOTE — Progress Notes (Signed)
  Oncology Nurse Navigator Documentation  Navigator Location: [facility]-Med Onc (05/02/19 1100)   )Navigator Encounter Type: Follow-up Appt;Initial RadOnc (05/02/19 1100)                     Patient Visit Type: MedOnc;RadOnc (05/02/19 1100) Treatment Phase: Pre-Tx/Tx Discussion (05/02/19 1100) Barriers/Navigation Needs: Coordination of Care;Education (05/02/19 1100) Education: Newly Diagnosed Cancer Education;Understanding Cancer/ Treatment Options (05/02/19 1100) Interventions: Coordination of Care;Education (05/02/19 1100)   Coordination of Care: Appts;Chemo (05/02/19 1100) Education Method: Written (05/02/19 1100)       met with patient and his caregiver during follow up visit with Dr. Grayland Ormond to discuss imaging results and treatment options. All questions answered during visit. Pt and his caregiver were given resources regarding diagnosis and supportive services available. Reviewed upcoming appts. Instructed to call with any further questions or needs. Pt and his caregiver verbalized understanding. Nothing further needed at this time.         Time Spent with Patient: 90 (05/02/19 1100)

## 2019-05-03 ENCOUNTER — Ambulatory Visit
Admission: RE | Admit: 2019-05-03 | Discharge: 2019-05-03 | Disposition: A | Discharge status: Home / Self Care | Payer: Medicare Other | Source: Ambulatory Visit | Attending: Radiation Oncology | Admitting: Radiation Oncology

## 2019-05-03 DIAGNOSIS — C7951 Secondary malignant neoplasm of bone: Secondary | ICD-10-CM | POA: Diagnosis not present

## 2019-05-03 DIAGNOSIS — Z510 Encounter for antineoplastic radiation therapy: Secondary | ICD-10-CM | POA: Diagnosis not present

## 2019-05-03 DIAGNOSIS — C3411 Malignant neoplasm of upper lobe, right bronchus or lung: Secondary | ICD-10-CM | POA: Diagnosis not present

## 2019-05-03 NOTE — Patient Instructions (Signed)
Pembrolizumab injection What is this medicine? PEMBROLIZUMAB (pem broe liz ue mab) is a monoclonal antibody. It is used to treat certain types of cancer. This medicine may be used for other purposes; ask your health care provider or pharmacist if you have questions. COMMON BRAND NAME(S): Keytruda What should I tell my health care provider before I take this medicine? They need to know if you have any of these conditions:  diabetes  immune system problems  inflammatory bowel disease  liver disease  lung or breathing disease  lupus  received or scheduled to receive an organ transplant or a stem-cell transplant that uses donor stem cells  an unusual or allergic reaction to pembrolizumab, other medicines, foods, dyes, or preservatives  pregnant or trying to get pregnant  breast-feeding How should I use this medicine? This medicine is for infusion into a vein. It is given by a health care professional in a hospital or clinic setting. A special MedGuide will be given to you before each treatment. Be sure to read this information carefully each time. Talk to your pediatrician regarding the use of this medicine in children. While this drug may be prescribed for children as young as 6 months for selected conditions, precautions do apply. Overdosage: If you think you have taken too much of this medicine contact a poison control center or emergency room at once. NOTE: This medicine is only for you. Do not share this medicine with others. What if I miss a dose? It is important not to miss your dose. Call your doctor or health care professional if you are unable to keep an appointment. What may interact with this medicine? Interactions have not been studied. Give your health care provider a list of all the medicines, herbs, non-prescription drugs, or dietary supplements you use. Also tell them if you smoke, drink alcohol, or use illegal drugs. Some items may interact with your medicine. This  list may not describe all possible interactions. Give your health care provider a list of all the medicines, herbs, non-prescription drugs, or dietary supplements you use. Also tell them if you smoke, drink alcohol, or use illegal drugs. Some items may interact with your medicine. What should I watch for while using this medicine? Your condition will be monitored carefully while you are receiving this medicine. You may need blood work done while you are taking this medicine. Do not become pregnant while taking this medicine or for 4 months after stopping it. Women should inform their doctor if they wish to become pregnant or think they might be pregnant. There is a potential for serious side effects to an unborn child. Talk to your health care professional or pharmacist for more information. Do not breast-feed an infant while taking this medicine or for 4 months after the last dose. What side effects may I notice from receiving this medicine? Side effects that you should report to your doctor or health care professional as soon as possible:  allergic reactions like skin rash, itching or hives, swelling of the face, lips, or tongue  bloody or black, tarry  breathing problems  changes in vision  chest pain  chills  confusion  constipation  cough  diarrhea  dizziness or feeling faint or lightheaded  fast or irregular heartbeat  fever  flushing  joint pain  low blood counts - this medicine may decrease the number of white blood cells, red blood cells and platelets. You may be at increased risk for infections and bleeding.  muscle pain  muscle   weakness  pain, tingling, numbness in the hands or feet  persistent headache  redness, blistering, peeling or loosening of the skin, including inside the mouth  signs and symptoms of high blood sugar such as dizziness; dry mouth; dry skin; fruity breath; nausea; stomach pain; increased hunger or thirst; increased urination  signs  and symptoms of kidney injury like trouble passing urine or change in the amount of urine  signs and symptoms of liver injury like dark urine, light-colored stools, loss of appetite, nausea, right upper belly pain, yellowing of the eyes or skin  sweating  swollen lymph nodes  weight loss Side effects that usually do not require medical attention (report to your doctor or health care professional if they continue or are bothersome):  decreased appetite  hair loss  muscle pain  tiredness This list may not describe all possible side effects. Call your doctor for medical advice about side effects. You may report side effects to FDA at 1-800-FDA-1088. Where should I keep my medicine? This drug is given in a hospital or clinic and will not be stored at home. NOTE: This sheet is a summary. It may not cover all possible information. If you have questions about this medicine, talk to your doctor, pharmacist, or health care provider.  2020 [employer]/Gold Standard (2018-12-09 18:07:58) Pemetrexed injection What is this medicine? PEMETREXED (PEM e TREX ed) is a chemotherapy drug used to treat lung cancers like non-small cell lung cancer and mesothelioma. It may also be used to treat other cancers. This medicine may be used for other purposes; ask your health care provider or pharmacist if you have questions. COMMON BRAND NAME(S): Alimta What should I tell my health care provider before I take this medicine? They need to know if you have any of these conditions:  infection (especially a virus infection such as chickenpox, cold sores, or herpes)  kidney disease  low blood counts, like low white cell, platelet, or red cell counts  lung or breathing disease, like asthma  radiation therapy  an unusual or allergic reaction to pemetrexed, other medicines, foods, dyes, or preservative  pregnant or trying to get pregnant  breast-feeding How should I use this medicine? This drug is given as  an infusion into a vein. It is administered in a hospital or clinic by a specially trained health care professional. Talk to your pediatrician regarding the use of this medicine in children. Special care may be needed. Overdosage: If you think you have taken too much of this medicine contact a poison control center or emergency room at once. NOTE: This medicine is only for you. Do not share this medicine with others. What if I miss a dose? It is important not to miss your dose. Call your doctor or health care professional if you are unable to keep an appointment. What may interact with this medicine? This medicine may interact with the following medications:  Ibuprofen This list may not describe all possible interactions. Give your health care provider a list of all the medicines, herbs, non-prescription drugs, or dietary supplements you use. Also tell them if you smoke, drink alcohol, or use illegal drugs. Some items may interact with your medicine. What should I watch for while using this medicine? Visit your doctor for checks on your progress. This drug may make you feel generally unwell. This is not uncommon, as chemotherapy can affect healthy cells as well as cancer cells. Report any side effects. Continue your course of treatment even though you feel ill unless   your doctor tells you to stop. In some cases, you may be given additional medicines to help with side effects. Follow all directions for their use. Call your doctor or health care professional for advice if you get a fever, chills or sore throat, or other symptoms of a cold or flu. Do not treat yourself. This drug decreases your body's ability to fight infections. Try to avoid being around people who are sick. This medicine may increase your risk to bruise or bleed. Call your doctor or health care professional if you notice any unusual bleeding. Be careful brushing and flossing your teeth or using a toothpick because you may get an  infection or bleed more easily. If you have any dental work done, tell your dentist you are receiving this medicine. Avoid taking products that contain aspirin, acetaminophen, ibuprofen, naproxen, or ketoprofen unless instructed by your doctor. These medicines may hide a fever. Call your doctor or health care professional if you get diarrhea or mouth sores. Do not treat yourself. To protect your kidneys, drink water or other fluids as directed while you are taking this medicine. Do not become pregnant while taking this medicine or for 6 months after stopping it. Women should inform their doctor if they wish to become pregnant or think they might be pregnant. Men should not father a child while taking this medicine and for 3 months after stopping it. This may interfere with the ability to father a child. You should talk to your doctor or health care professional if you are concerned about your fertility. There is a potential for serious side effects to an unborn child. Talk to your health care professional or pharmacist for more information. Do not breast-feed an infant while taking this medicine or for 1 week after stopping it. What side effects may I notice from receiving this medicine? Side effects that you should report to your doctor or health care professional as soon as possible:  allergic reactions like skin rash, itching or hives, swelling of the face, lips, or tongue  breathing problems  redness, blistering, peeling or loosening of the skin, including inside the mouth  signs and symptoms of bleeding such as bloody or black, tarry stools; red or dark-brown urine; spitting up blood or brown material that looks like coffee grounds; red spots on the skin; unusual bruising or bleeding from the eye, gums, or nose  signs and symptoms of infection like fever or chills; cough; sore throat; pain or trouble passing urine  signs and symptoms of kidney injury like trouble passing urine or change in the  amount of urine  signs and symptoms of liver injury like dark yellow or brown urine; general ill feeling or flu-like symptoms; light-colored stools; loss of appetite; nausea; right upper belly pain; unusually weak or tired; yellowing of the eyes or skin Side effects that usually do not require medical attention (report to your doctor or health care professional if they continue or are bothersome):  constipation  mouth sores  nausea, vomiting  unusually weak or tired This list may not describe all possible side effects. Call your doctor for medical advice about side effects. You may report side effects to FDA at 1-800-FDA-1088. Where should I keep my medicine? This drug is given in a hospital or clinic and will not be stored at home. NOTE: This sheet is a summary. It may not cover all possible information. If you have questions about this medicine, talk to your doctor, pharmacist, or health care provider.  2020   [employer]/Gold Standard (2017-03-24 16:11:33) Carboplatin injection What is this medicine? CARBOPLATIN (KAR boe pla tin) is a chemotherapy drug. It targets fast dividing cells, like cancer cells, and causes these cells to die. This medicine is used to treat ovarian cancer and many other cancers. This medicine may be used for other purposes; ask your health care provider or pharmacist if you have questions. COMMON BRAND NAME(S): Paraplatin What should I tell my health care provider before I take this medicine? They need to know if you have any of these conditions:  blood disorders  hearing problems  kidney disease  recent or ongoing radiation therapy  an unusual or allergic reaction to carboplatin, cisplatin, other chemotherapy, other medicines, foods, dyes, or preservatives  pregnant or trying to get pregnant  breast-feeding How should I use this medicine? This drug is usually given as an infusion into a vein. It is administered in a hospital or clinic by a specially  trained health care professional. Talk to your pediatrician regarding the use of this medicine in children. Special care may be needed. Overdosage: If you think you have taken too much of this medicine contact a poison control center or emergency room at once. NOTE: This medicine is only for you. Do not share this medicine with others. What if I miss a dose? It is important not to miss a dose. Call your doctor or health care professional if you are unable to keep an appointment. What may interact with this medicine?  medicines for seizures  medicines to increase blood counts like filgrastim, pegfilgrastim, sargramostim  some antibiotics like amikacin, gentamicin, neomycin, streptomycin, tobramycin  vaccines Talk to your doctor or health care professional before taking any of these medicines:  acetaminophen  aspirin  ibuprofen  ketoprofen  naproxen This list may not describe all possible interactions. Give your health care provider a list of all the medicines, herbs, non-prescription drugs, or dietary supplements you use. Also tell them if you smoke, drink alcohol, or use illegal drugs. Some items may interact with your medicine. What should I watch for while using this medicine? Your condition will be monitored carefully while you are receiving this medicine. You will need important blood work done while you are taking this medicine. This drug may make you feel generally unwell. This is not uncommon, as chemotherapy can affect healthy cells as well as cancer cells. Report any side effects. Continue your course of treatment even though you feel ill unless your doctor tells you to stop. In some cases, you may be given additional medicines to help with side effects. Follow all directions for their use. Call your doctor or health care professional for advice if you get a fever, chills or sore throat, or other symptoms of a cold or flu. Do not treat yourself. This drug decreases your body's  ability to fight infections. Try to avoid being around people who are sick. This medicine may increase your risk to bruise or bleed. Call your doctor or health care professional if you notice any unusual bleeding. Be careful brushing and flossing your teeth or using a toothpick because you may get an infection or bleed more easily. If you have any dental work done, tell your dentist you are receiving this medicine. Avoid taking products that contain aspirin, acetaminophen, ibuprofen, naproxen, or ketoprofen unless instructed by your doctor. These medicines may hide a fever. Do not become pregnant while taking this medicine. Women should inform their doctor if they wish to become pregnant or think they might   be pregnant. There is a potential for serious side effects to an unborn child. Talk to your health care professional or pharmacist for more information. Do not breast-feed an infant while taking this medicine. What side effects may I notice from receiving this medicine? Side effects that you should report to your doctor or health care professional as soon as possible:  allergic reactions like skin rash, itching or hives, swelling of the face, lips, or tongue  signs of infection - fever or chills, cough, sore throat, pain or difficulty passing urine  signs of decreased platelets or bleeding - bruising, pinpoint red spots on the skin, black, tarry stools, nosebleeds  signs of decreased red blood cells - unusually weak or tired, fainting spells, lightheadedness  breathing problems  changes in hearing  changes in vision  chest pain  high blood pressure  low blood counts - This drug may decrease the number of white blood cells, red blood cells and platelets. You may be at increased risk for infections and bleeding.  nausea and vomiting  pain, swelling, redness or irritation at the injection site  pain, tingling, numbness in the hands or feet  problems with balance, talking,  walking  trouble passing urine or change in the amount of urine Side effects that usually do not require medical attention (report to your doctor or health care professional if they continue or are bothersome):  hair loss  loss of appetite  metallic taste in the mouth or changes in taste This list may not describe all possible side effects. Call your doctor for medical advice about side effects. You may report side effects to FDA at 1-800-FDA-1088. Where should I keep my medicine? This drug is given in a hospital or clinic and will not be stored at home. NOTE: This sheet is a summary. It may not cover all possible information. If you have questions about this medicine, talk to your doctor, pharmacist, or health care provider.  2020 [employer]/Gold Standard (2007-05-10 14:38:05)  

## 2019-05-04 ENCOUNTER — Encounter: Payer: Self-pay | Admitting: Oncology

## 2019-05-05 ENCOUNTER — Inpatient Hospital Stay (HOSPITAL_BASED_OUTPATIENT_CLINIC_OR_DEPARTMENT_OTHER): Payer: Medicare Other | Admitting: Oncology

## 2019-05-05 ENCOUNTER — Inpatient Hospital Stay: Payer: Medicare Other

## 2019-05-05 DIAGNOSIS — C3491 Malignant neoplasm of unspecified part of right bronchus or lung: Secondary | ICD-10-CM

## 2019-05-05 DIAGNOSIS — C3490 Malignant neoplasm of unspecified part of unspecified bronchus or lung: Secondary | ICD-10-CM

## 2019-05-05 DIAGNOSIS — Z5112 Encounter for antineoplastic immunotherapy: Secondary | ICD-10-CM | POA: Diagnosis not present

## 2019-05-05 DIAGNOSIS — Z510 Encounter for antineoplastic radiation therapy: Secondary | ICD-10-CM | POA: Diagnosis not present

## 2019-05-05 LAB — CBC WITH DIFFERENTIAL/PLATELET
Abs Immature Granulocytes: 0.05 10*3/uL (ref 0.00–0.07)
Basophils Absolute: 0 10*3/uL (ref 0.0–0.1)
Basophils Relative: 1 %
Eosinophils Absolute: 0.1 10*3/uL (ref 0.0–0.5)
Eosinophils Relative: 2 %
HCT: 34.4 % — ABNORMAL LOW (ref 39.0–52.0)
Hemoglobin: 11.5 g/dL — ABNORMAL LOW (ref 13.0–17.0)
Immature Granulocytes: 1 %
Lymphocytes Relative: 19 %
Lymphs Abs: 1.1 10*3/uL (ref 0.7–4.0)
MCH: 32.4 pg (ref 26.0–34.0)
MCHC: 33.4 g/dL (ref 30.0–36.0)
MCV: 96.9 fL (ref 80.0–100.0)
Monocytes Absolute: 0.7 10*3/uL (ref 0.1–1.0)
Monocytes Relative: 13 %
Neutro Abs: 3.9 10*3/uL (ref 1.7–7.7)
Neutrophils Relative %: 64 %
Platelets: 151 10*3/uL (ref 150–400)
RBC: 3.55 MIL/uL — ABNORMAL LOW (ref 4.22–5.81)
RDW: 14.7 % (ref 11.5–15.5)
WBC: 5.9 10*3/uL (ref 4.0–10.5)
nRBC: 0 % (ref 0.0–0.2)

## 2019-05-05 LAB — COMPREHENSIVE METABOLIC PANEL
ALT: 53 U/L — ABNORMAL HIGH (ref 0–44)
AST: 77 U/L — ABNORMAL HIGH (ref 15–41)
Albumin: 2.9 g/dL — ABNORMAL LOW (ref 3.5–5.0)
Alkaline Phosphatase: 103 U/L (ref 38–126)
Anion gap: 5 (ref 5–15)
BUN: 15 mg/dL (ref 8–23)
CO2: 25 mmol/L (ref 22–32)
Calcium: 9 mg/dL (ref 8.9–10.3)
Chloride: 105 mmol/L (ref 98–111)
Creatinine, Ser: 1.09 mg/dL (ref 0.61–1.24)
GFR calc Af Amer: >60 mL/min (ref >60)
GFR calc non Af Amer: >60 mL/min (ref >60)
Glucose, Bld: 102 mg/dL — ABNORMAL HIGH (ref 70–99)
Potassium: 4.2 mmol/L (ref 3.5–5.1)
Sodium: 135 mmol/L (ref 135–145)
Total Bilirubin: 1.2 mg/dL (ref 0.3–1.2)
Total Protein: 8.3 g/dL — ABNORMAL HIGH (ref 6.5–8.1)

## 2019-05-05 LAB — TSH: TSH: 10.726 u[IU]/mL — ABNORMAL HIGH (ref 0.350–4.500)

## 2019-05-05 NOTE — Progress Notes (Signed)
[place]  [phone] [phone]  Patient Care Team: Remi Haggard, FNP as PCP - General (Family Medicine) Telford Nab, RN as Oncology Nurse Navigator   Name of the patient: [name]  [id number]  [birth year]   Date of visit: 05/05/19  Diagnosis- Lung Cancer   Chief complaint/Reason for visit- Initial Meeting for [facility], preparing for starting chemotherapy  Heme/Onc history:  Oncology History  Adenocarcinoma, lung, right ([place])  03/17/2019 Initial Diagnosis   Adenocarcinoma, lung, right ([place])   05/02/2019 Cancer Staging   Staging form: Lung, AJCC 8th Edition - Clinical stage from 05/02/2019: Stage IVB (cT1c, cN2, cM1c) - Signed by Lloyd Huger, MD on 05/02/2019   05/11/2019 -  Chemotherapy   The patient had palonosetron (ALOXI) injection 0.25 mg, 0.25 mg, Intravenous,  Once, 0 of 4 cycles PEMEtrexed (ALIMTA) 850 mg in sodium chloride 0.9 % 100 mL chemo infusion, 500 mg/m2, Intravenous,  Once, 0 of 6 cycles CARBOplatin (PARAPLATIN) in sodium chloride 0.9 % 100 mL chemo infusion,  (original dose ), Intravenous,  Once, 0 of 4 cycles Dose modification:   (Cycle 1) fosaprepitant (EMEND) 150 mg in sodium chloride 0.9 % 145 mL IVPB, 150 mg, Intravenous,  Once, 0 of 4 cycles pembrolizumab (KEYTRUDA) 200 mg in sodium chloride 0.9 % 50 mL chemo infusion, 200 mg, Intravenous, Once, 0 of 6 cycles  for chemotherapy treatment.      Interval history-[name] is a 74 year old male who presents to chemo care clinic today for initial meeting in preparation for starting chemotherapy. I introduced the chemo care clinic and we discussed that the role of the clinic is to assist those who are at an increased risk of emergency room visits and/or complications during the course of chemotherapy treatment. We discussed that the increased risk takes into account factors such as age, performance status, and  co-morbidities. We also discussed that for some, this might include barriers to care such as not having a primary care provider, lack of insurance/transportation, or not being able to afford medications. We discussed that the goal of the program is to help prevent unplanned ER visits and help reduce complications during chemotherapy. We do this by discussing specific risk factors to each individual and identifying ways that we can help improve these risk factors and reduce barriers to care.   ECOG FS:1 - Symptomatic but completely ambulatory  Review of systems- Review of Systems  Constitutional: Negative.  Negative for chills, fever, malaise/fatigue and weight loss.  HENT: Negative for congestion, ear pain and tinnitus.   Eyes: Negative.  Negative for blurred vision and double vision.  Respiratory: Negative.  Negative for cough, sputum production and shortness of breath.   Cardiovascular: Negative.  Negative for chest pain, palpitations and leg swelling.  Gastrointestinal: Negative.  Negative for abdominal pain, constipation, diarrhea, nausea and vomiting.  Genitourinary: Negative for dysuria, frequency and urgency.  Musculoskeletal: Negative for back pain and falls.  Skin: Negative.  Negative for rash.  Neurological: Negative.  Negative for weakness and headaches.  Endo/Heme/Allergies: Negative.  Does not bruise/bleed easily.  Psychiatric/Behavioral: Negative.  Negative for depression. The patient is not nervous/anxious and does not have insomnia.      Current treatment-scheduled to begin carbo/pemetrexed and Keytruda every 3 weeks for 4 cycles  Allergies  Allergen Reactions  . Penicillins Other (See Comments)    Reaction: unknown Patient is not able to answer follow up questions    Past  Medical History:  Diagnosis Date  . Alcohol abuse   . Cirrhosis ([place])   . COPD (chronic obstructive pulmonary disease) ([place])   . Dementia ([place])   . Hepatitis C   . Malnutrition ([place])   . Throat  cancer [facility])     Past Surgical History:  Procedure Laterality Date  . IR IMAGING GUIDED PORT INSERTION  04/28/2019  . TONSILLECTOMY     age was 54  . VASECTOMY      Social History   Socioeconomic History  . Marital status: Divorced    Spouse name: Not on file  . Number of children: Not on file  . Years of education: Not on file  . Highest education level: Not on file  Occupational History  . Not on file  Tobacco Use  . Smoking status: Former Smoker    Packs/day: 0.50    Years: 63.00    Pack years: 31.50    Types: Cigarettes  . Smokeless tobacco: Never Used  Substance and Sexual Activity  . Alcohol use: Not Currently    Alcohol/week: 1.0 standard drinks    Types: 1 Cans of beer per week  . Drug use: No  . Sexual activity: Not Currently  Other Topics Concern  . Not on file  Social History Narrative   L[facility] at [facility]   Social Determinants of Health   Financial Resource Strain:   . Difficulty of Paying Living Expenses:   Food Insecurity:   . Worried About Charity fundraiser in the Last Year:   . Arboriculturist in the Last Year:   Transportation Needs:   . Film/video editor (Medical):   Marland Kitchen Lack of Transportation (Non-Medical):   Physical Activity:   . Days of Exercise per Week:   . Minutes of Exercise per Session:   Stress:   . Feeling of Stress :   Social Connections:   . Frequency of Communication with Friends and Family:   . Frequency of Social Gatherings with Friends and Family:   . Attends Religious Services:   . Active Member of Clubs or Organizations:   . Attends Archivist Meetings:   Marland Kitchen Marital Status:   Intimate Partner Violence:   . Fear of Current or Ex-Partner:   . Emotionally Abused:   Marland Kitchen Physically Abused:   . Sexually Abused:     Family History  Problem Relation Age of Onset  . Breast cancer Mother      Current Outpatient Medications:  .  alum & mag hydroxide-simeth (MAALOX/MYLANTA) 200-200-20 MG/5ML  suspension, Take 15 mLs by mouth as needed for indigestion or heartburn., Disp: 355 mL, Rfl: 0 .  buPROPion (WELLBUTRIN SR) 150 MG 12 hr tablet, bupropion HCl SR 150 mg tablet,12 hr sustained-release, Disp: , Rfl:  .  Cholecalciferol (VITAMIN D3) 5000 units TABS, Take 5,000 Units by mouth daily., Disp: , Rfl:  .  docusate (COLACE) 50 MG/5ML liquid, Take 100 mg by mouth daily. , Disp: , Rfl:  .  ferrous sulfate 325 (65 FE) MG tablet, Take 325 mg by mouth daily with breakfast., Disp: , Rfl:  .  folic acid (FOLVITE) 1 MG tablet, Take 1 mg by mouth daily., Disp: , Rfl:  .  folic acid (FOLVITE) 1 MG tablet, Take 1 tablet (1 mg total) by mouth daily. Start 5-7 days before Alimta chemotherapy. Continue until 21 days after Alimta completed., Disp: 100 tablet, Rfl: 3 .  hydrOXYzine (ATARAX/VISTARIL) 25 MG tablet, Take 100  mg by mouth at bedtime. , Disp: , Rfl:  .  lactose free nutrition (BOOST PLUS) LIQD, Take 237 mLs by mouth 4 (four) times daily., Disp: , Rfl:  .  levothyroxine (SYNTHROID) 100 MCG tablet, Take 100 mcg by mouth daily before breakfast., Disp: , Rfl:  .  lidocaine-prilocaine (EMLA) cream, Apply to affected area once, Disp: 30 g, Rfl: 3 .  losartan (COZAAR) 25 MG tablet, Take 25 mg by mouth daily., Disp: , Rfl:  .  mirtazapine (REMERON) 15 MG tablet, Take 15 mg by mouth at bedtime., Disp: , Rfl:  .  mupirocin ointment (BACTROBAN) 2 %, Apply 1 application topically 2 (two) times daily., Disp: , Rfl:  .  ondansetron (ZOFRAN) 8 MG tablet, Take by mouth every 8 (eight) hours as needed for nausea or vomiting., Disp: , Rfl:  .  ondansetron (ZOFRAN) 8 MG tablet, Take 1 tablet (8 mg total) by mouth 2 (two) times daily as needed (Nausea or vomiting). Start if needed on the third day after chemotherapy., Disp: 60 tablet, Rfl: 2 .  polyethylene glycol (MIRALAX / GLYCOLAX) 17 g packet, Take 17 g by mouth daily., Disp: , Rfl:  .  prochlorperazine (COMPAZINE) 10 MG tablet, Take 1 tablet (10 mg total) by  mouth every 6 (six) hours as needed (Nausea or vomiting)., Disp: 60 tablet, Rfl: 2 .  QUEtiapine (SEROQUEL) 25 MG tablet, Take 25 mg by mouth 3 (three) times daily., Disp: , Rfl:  .  QUEtiapine (SEROQUEL) 50 MG tablet, Take 1 tablet (50 mg total) by mouth at bedtime. (Patient taking differently: Take 25 mg by mouth 3 (three) times daily. ), Disp: 30 tablet, Rfl: 0 .  tamsulosin (FLOMAX) 0.4 MG CAPS capsule, Take 0.4 mg by mouth 2 (two) times daily. , Disp: , Rfl:  .  thiamine 100 MG tablet, Take 100 mg by mouth daily., Disp: , Rfl:  .  TRELEGY ELLIPTA 100-62.5-25 MCG/INH AEPB, Inhale 1 puff into the lungs in the morning. , Disp: , Rfl:  .  vitamin C (ASCORBIC ACID) 250 MG tablet, Take 500 mg by mouth daily. , Disp: , Rfl:  .  Vitamin D, Ergocalciferol, (DRISDOL) 1.25 MG (50000 UNIT) CAPS capsule, Take 50,000 Units by mouth every 7 (seven) days., Disp: , Rfl:  No current facility-administered medications for this visit.  Facility-Administered Medications Ordered in Other Visits:  .  heparin lock flush 100 unit/mL, 500 Units, Intravenous, Once, Finnegan, Kathlene November, MD  Physical exam: There were no vitals filed for this visit. Physical Exam Constitutional:      Appearance: Normal appearance.  HENT:     Head: Normocephalic and atraumatic.  Eyes:     Pupils: Pupils are equal, round, and reactive to light.  Cardiovascular:     Rate and Rhythm: Normal rate and regular rhythm.     Heart sounds: Normal heart sounds. No murmur.  Pulmonary:     Effort: Pulmonary effort is normal.     Breath sounds: Normal breath sounds. No wheezing.  Abdominal:     General: Bowel sounds are normal. There is no distension.     Palpations: Abdomen is soft.     Tenderness: There is no abdominal tenderness.  Musculoskeletal:        General: Normal range of motion.     Cervical back: Normal range of motion.  Skin:    General: Skin is warm and dry.     Findings: No rash.  Neurological:     Mental Status: He is  alert and oriented to person, place, and time.  Psychiatric:        Judgment: Judgment normal.      CMP Latest Ref Rng & Units 05/05/2019  Glucose 70 - 99 mg/dL 102(H)  BUN 8 - 23 mg/dL 15  Creatinine 0.61 - 1.24 mg/dL 1.09  Sodium 135 - 145 mmol/L 135  Potassium 3.5 - 5.1 mmol/L 4.2  Chloride 98 - 111 mmol/L 105  CO2 22 - 32 mmol/L 25  Calcium 8.9 - 10.3 mg/dL 9.0  Total Protein 6.5 - 8.1 g/dL 8.3(H)  Total Bilirubin 0.3 - 1.2 mg/dL 1.2  Alkaline Phos 38 - 126 U/L 103  AST 15 - 41 U/L 77(H)  ALT 0 - 44 U/L 53(H)   CBC Latest Ref Rng & Units 05/05/2019  WBC 4.0 - 10.5 K/uL 5.9  Hemoglobin 13.0 - 17.0 g/dL 11.5(L)  Hematocrit 39.0 - 52.0 % 34.4(L)  Platelets 150 - 400 K/uL 151    No images are attached to the encounter.  MR Brain W Wo Contrast  Result Date: 05/01/2019 CLINICAL DATA:  Lung cancer. Screening EXAM: MRI HEAD WITHOUT AND WITH CONTRAST TECHNIQUE: Multiplanar, multiecho pulse sequences of the brain and surrounding structures were obtained without and with intravenous contrast. CONTRAST:  20m GADAVIST GADOBUTROL 1 MMOL/ML IV SOLN COMPARISON:  Head CT September 30, 2016 and with FINDINGS: Brain: No acute infarction, hemorrhage, hydrocephalus or mass lesion. Area encephalomalacia and gliosis involving the right temporal lobe, insula, basal ganglia and frontal lobe consistent with old right MCA territory infarct with associated retraction of the right lateral ventricle. Scattered and confluent foci of T2 hyperintensity are seen within the white matter of the cerebral hemispheres and within the pons, nonspecific, most likely related to chronic small vessel ischemia. 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 Focal parafalcine dural thickening with heterogeneous contrast enhancement is noted in the right frontal region (series 18, image 35) measuring up to 10 mm. 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 Vascular: Normal flow voids. Skull and upper cervical spine: Normal marrow signal. Sinuses/Orbits: Mucosal thickening of the bilateral maxillary sinuses and ethmoid cells. The orbits are maintained. Other: None. IMPRESSION: 1. No evidence of intracranial metastatic disease. 2. 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 3. Encephalomalacia and gliosis involving the right temporal lobe, insula, basal ganglia and frontal lobe consistent with old right MCA territory infarct. 4. Moderate chronic small vessel ischemia. 5. Inflammatory paranasal sinus disease. Electronically Signed   By: KPedro EarlsM.D.   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 EXAM: NUCLEAR MEDICINE PET SKULL BASE TO THIGH TECHNIQUE: 7.26 mCi F-18 FDG was injected intravenously. Full-ring PET imaging was performed from the skull base to thigh after the radiotracer. CT data was obtained and used for attenuation correction and anatomic localization. Fasting blood glucose: 94 mg/dl COMPARISON:  None. Multiple prior PET examinations most recent from 11/21/2018 and CT of the chest, abdomen pelvis from 03/30/2019 FINDINGS: Mediastinal blood pool activity: SUV max 1.98 Liver activity: SUV max NA NECK: No hypermetabolic lymph nodes in the neck. Incidental  CT findings: Evidence of prior right MCA infarct with encephalomalacia partially imaged on today's study. Carotid atherosclerosis bilaterally. Signs of chronic maxillary sinusitis.  This is seen on the left. CHEST: Signs of mediastinal lymphadenopathy. Largest is a bulky subcarinal  lymph node measuring 1.9 cm short axis. Size is stable compared to the recent CT from February of 2021, enlarged since the PET scan of November 21, 2018 where it measured 1.4 cm in short axis (SUVmax = 10.0) previously 9.2 (image 104, series 3) Left AP window adenopathy 1.4 cm short axis (image 94, series 3) (SUVmax = 9.8) previously 7.8, size is similar compared to the recent study from February 2021 but increased in size compared to the prior PET scan where it measured 1 cm. Decreased size of precarinal lymph nodes (image 95, series 3) 0.6 cm as compared to 1.1 cm on the previous PET scan (SUVmax = 3.5) previously 7.8 other right paratracheal lymph nodes with similar FDG avidity. Right upper lobe mass abutting the pleura measuring 2.9 x 2.8 cm. This is stable compared to the exam from February of 2021 and is increased from approximately 2.3 x 2.8 cm on the October of 2020 exam (SUVmax = 17) previously 12.9 Incidental CT findings: Small right-sided pleural effusion is unchanged. Small right apical nodule along the right superior mediastinal border is unchanged with max SUV less than background blood pool activity. This measures approximately 7 mm. Signs of background pulmonary emphysema. Subtle ground-glass at the left lung base with suggestion of nodularity (image 118, series 3) largest area measuring approximately 0.8 cm with max SUV of approximately 2.2. Is scattered areas similar to this are found in a segmental distribution. ABDOMEN/PELVIS: No abnormal hypermetabolic activity within the liver, pancreas, adrenal glands, or spleen. No hypermetabolic lymph nodes in the abdomen or pelvis. Incidental CT findings: Liver displays a cirrhotic morphology. Signs of cholelithiasis. Spleen is normal size. The adrenal glands are normal as is the pancreas on noncontrast imaging. No signs of hydronephrosis. No signs of bowel obstruction or acute bowel process with signs of colonic diverticulosis. SKELETON: Signs of destructive  lesion in the left acetabulum with associated increased FDG uptake. This measures approximately 2.2 x 1.5 cm (image 236, series 3) (SUVmax = 15.9) Incidental CT findings: Spinal degenerative changes. IMPRESSION: 1. Enlargement of the right upper lobe mass and subcarinal and AP window lymph nodes since the PET scan of October of 2020, stable since February of this year. Also with increased FDG uptake in these areas when compared to the prior PET scan. 2. Diminished size and FDG uptake within the precarinal lymph node but with stable appearance of other smaller lymph nodes in the chest. 3. 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 4. Small right pleural effusion is similar to prior study with similar pattern of uptake less than background. Attention on follow-up. 5. New signs of left lower lobe airspace disease with scattered areas of ground-glass and nodularity with segmental distribution. Findings may represent infectious or inflammatory sequela. Attention on follow-up. 6. Signs of cirrhosis. 7. Signs of previous CVA and chronic maxillary sinus disease. Electronically Signed   By: Zetta Bills M.D.   On: 04/26/2019 14:10   CT Biopsy  Result Date: 04/12/2019 INDICATION: 74 year old male with a history of squamous cell carcinoma of the esophagus and an enlarging hypermetabolic right upper lobe pulmonary mass concerning for either esophageal metastatic disease or a synchronous primary bronchogenic carcinoma. EXAM: CT-guided biopsy right upper lobe pulmonary nodule Interventional Radiologist:  Criselda Peaches, MD  MEDICATIONS: None. ANESTHESIA/SEDATION: Fentanyl 25 mcg IV; Versed 0.5 mg IV Moderate Sedation Time:  13 minutes The patient was continuously monitored during the procedure by the interventional radiology nurse under my direct supervision. FLUOROSCOPY TIME:  None. COMPLICATIONS: None immediate. Estimated blood loss:  0 PROCEDURE: Informed written consent was obtained from the patient after a  thorough discussion of the procedural risks, benefits and alternatives. All questions were addressed. Maximal Sterile Barrier Technique was utilized including caps, mask, sterile gowns, sterile gloves, sterile drape, hand hygiene and skin antiseptic. A timeout was performed prior to the initiation of the procedure. A planning axial CT scan was performed. The nodule in the right upper lobe was successfully identified. A suitable skin entry site was selected and marked. The region was then sterilely prepped and draped in standard fashion using Betadine skin prep. Local anesthesia was attained by infiltration with 1% lidocaine. A small dermatotomy was made. Under intermittent CT fluoroscopic guidance, a 17 gauge trocar needle was advanced into the lung and positioned at the margin of the nodule. Multiple 18 gauge core biopsies were then coaxially obtained using the BioPince automated biopsy device. Biopsy specimens were placed in formalin and delivered to pathology for further analysis. The biopsy device and introducer needle were removed. A bio sentry device was deployed. Post biopsy axial CT imaging demonstrates no evidence of immediate complication. There is no pneumothorax. Mild perilesional alveolar hemorrhage is not unexpected. The patient tolerated the procedure well. IMPRESSION: Technically successful CT-guided biopsy right upper lobe pulmonary nodule. Electronically Signed   By: Jacqulynn Cadet M.D.   On: 04/12/2019 17:24   DG Chest Port 1 View  Result Date: 04/12/2019 CLINICAL DATA:  74 year old male status post biopsy of right upper lobe pulmonary mass EXAM: PORTABLE CHEST 1 VIEW COMPARISON:  CT scan of the chest 03/30/2019 FINDINGS: No evidence of pneumothorax. Right upper lobe nodular opacity consistent with known underlying pulmonary mass. Haziness surrounding the nodule consistent with mild alveolar hemorrhage. Cardiac and mediastinal contours remain unchanged and within normal limits. No acute  osseous abnormality. IMPRESSION: No evidence of pneumothorax following right upper lobe pulmonary mass biopsy. Electronically Signed   By: Jacqulynn Cadet M.D.   On: 04/12/2019 16:03   IR IMAGING GUIDED PORT INSERTION  Result Date: 04/28/2019 INDICATION: History of lung cancer. In need of durable intravenous access for chemotherapy administration. EXAM: IMPLANTED PORT A CATH PLACEMENT WITH ULTRASOUND AND FLUOROSCOPIC GUIDANCE COMPARISON:  PET-CT-04/26/2019 MEDICATIONS: Vancomycin 1 gm IV; The antibiotic was administered within an appropriate time interval prior to skin puncture. ANESTHESIA/SEDATION: Moderate (conscious) sedation was employed during this procedure. A total of Versed 1 mg and Fentanyl 50 mcg was administered intravenously. Moderate Sedation Time: 25 minutes. The patient's level of consciousness and vital signs were monitored continuously by radiology nursing throughout the procedure under my direct supervision. CONTRAST:  None FLUOROSCOPY TIME:  18 seconds (1.8 mGy) COMPLICATIONS: None immediate. PROCEDURE: The procedure, risks, benefits, and alternatives were explained to the patient. Questions regarding the procedure were encouraged and answered. The patient understands and consents to the procedure. The right neck and chest were prepped with chlorhexidine in a sterile fashion, and a sterile drape was applied covering the operative field. Maximum barrier sterile technique with sterile gowns and gloves were used for the procedure. A timeout was performed prior to the initiation of the procedure. Local anesthesia was provided with 1% lidocaine with epinephrine. After creating a small venotomy incision, a micropuncture kit was utilized to access the internal jugular vein. Real-time ultrasound  guidance was utilized for vascular access including the acquisition of a permanent ultrasound image documenting patency of the accessed vessel. The microwire was utilized to measure appropriate catheter  length. A subcutaneous port pocket was then created along the upper chest wall utilizing a combination of sharp and blunt dissection. The pocket was irrigated with sterile saline. A single lumen "standard sized" power injectable port was chosen for placement. The 8 Fr catheter was tunneled from the port pocket site to the venotomy incision. The port was placed in the pocket. The external catheter was trimmed to appropriate length. At the venotomy, an 8 Fr peel-away sheath was placed over a guidewire under fluoroscopic guidance. The catheter was then placed through the sheath and the sheath was removed. Final catheter positioning was confirmed and documented with a fluoroscopic spot radiograph. The port was accessed with a Huber needle, aspirated and flushed with heparinized saline. The venotomy site was closed with an interrupted 4-0 Vicryl suture. The port pocket incision was closed with interrupted 2-0 Vicryl suture. The skin was opposed with a running subcuticular 4-0 Vicryl suture. Dermabond and Steri-strips were applied to both incisions. Dressings were applied. The patient tolerated the procedure well without immediate post procedural complication. FINDINGS: After catheter placement, the tip lies within the superior cavoatrial junction. The catheter aspirates and flushes normally and is ready for immediate use. IMPRESSION: Successful placement of a right internal jugular approach power injectable Port-A-Cath. The catheter is ready for immediate use. Electronically Signed   By: Sandi Mariscal M.D.   On: 04/28/2019 14:47     Assessment and plan- Patient is a 74 y.o. male who [facility] to [facility] for initial meeting in preparation for starting chemotherapy for the treatment of metastatic lung cancer   1. 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  He completed chemo/radiation in April 2018.  He was placed on surveillance.  He had a surveillance PET  scan in October 2020 which showed a large FDG right upper lobe mass.  Biopsy confirmed second primary adenocarcinoma of the lung.  Brain MRI was negative for metastatic disease.  PET scan revealed hyper metabolism of right upper lobe as well as subcarinal lymph nodes.  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  He had a port placed recently and is scheduled to begin treatment with carbo/Alimta and Keytruda every 3 weeks for 4 cycles followed by maintenance pemetrexed and Keytruda.  He will also receive radiation to acetabular metastasis.  Is a scheduled to begin on 05/08/2019.  2. Based on our high risk symptom management report; this patient has a high risk of ED utilization.  The percentage below indicates how "at risk "  this patient based on the factors in this table within one year.   General Risk Score: 4  Values used to calculate this score:   Points  Metrics      1        Age: [facility] Admissions: 0      0        ED Visits: 0      1        Has Chronic Obstructive Pulmonary Disease: Yes      0        Has Diabetes: No      0        Has Congestive Heart Failure: No  1        Has liver disease: Yes      0        Has Depression: No      0        Current PCP: Remi Haggard, FNP      1        Has Medicaid: Yes  3. 2. Chemo Care Clinic/High Risk for ER/Hospitalization during chemotherapy- We discussed the role of the chemo care clinic and identified patient specific risk factors. I discussed that patient was identified as high risk primarily based on: Stage of disease.  Patient has past medical history positive for: Past Medical History:  Diagnosis Date  . Alcohol abuse   . Cirrhosis ([place])   . COPD (chronic obstructive pulmonary disease) ([place])   . Dementia ([place])   . Hepatitis C   . Malnutrition ([place])   . Throat cancer [facility])     Patient has past surgical history positive for: Past Surgical History:  Procedure Laterality Date  . IR IMAGING  GUIDED PORT INSERTION  04/28/2019  . TONSILLECTOMY     age was 52  . VASECTOMY     3. We discussed that social determinants of health may have significant impacts on health and outcomes for cancer patients.  Today we discussed specific social determinants of performance status, alcohol use, depression, financial needs, food insecurity, housing, interpersonal violence, social connections, stress, tobacco use, and transportation.    After lengthy discussion the following were identified as areas of need:  Patient is a ward of the state and live in a group home.  His caretaker provides all of his meals and transportation to and from the cancer center.  They are interested in speaking with Barnabas Lister crater about possible financial support.  Will email Barnabas Lister.  Outpatient services: We discussed options including home based and outpatient services, DME and care program. We discusssed that patients who participate in regular physical activity report fewer negative impacts of cancer and treatments and report less fatigue.   Financial Concerns: We discussed that living with cancer can create tremendous financial burden.  We discussed options for assistance. I asked that if assistance is needed in affording medications or paying bills to please let us know so that we can provide assistance. We discussed options for food including social services, [employer] ($50 every 2 weeks) and onsite food pantry.  We will also notify Barnabas Lister crater to see if cancer center can provide additional support.  Referral to Social work: Introduced Education officer, museum Elease Etienne and the services he can provide such as support with [employer], cell phone and gas vouchers.   Support groups: We discussed options for support groups at the cancer center. If interested, please notify nurse navigator to enroll. We discussed options for managing stress including healthy eating, exercise as well as participating in no charge counseling  services at the cancer center and support groups.  If these are of interest, patient can notify either myself or primary nursing team.We discussed options for management including medications and referral to quit Smart program  Transportation: We discussed options for transportation including acta, paratransit, bus routes, link transit, taxi/uber/lyft, and cancer center [place].  I have notified primary oncology team who will help assist with arranging Lucianne Lei transportation for appointments when/if needed. We also discussed options for transportation on short notice/acute visits.  Palliative care services: We have palliative care services available in the cancer center to discuss goals of care and advanced care  planning.  Please let us know if you have any questions or would like to speak to our palliative nurse practitioner.  Symptom Management Clinic: We discussed our symptom management clinic which is available for acute concerns while receiving treatment such as nausea, vomiting or diarrhea.  We can be reached via telephone at [phone] or through my chart.  We are available for virtual or in person visits on the same day from 830 to 4 PM Monday through Friday. He denies needing specific assistance at this time and He will be followed by Dr. Elroy Channel clinical team.  Plan: Discussed symptom management clinic. Discussed palliative care services. Discussed resources that are available here at the cancer center. Discussed medications and new prescriptions to begin treatment such as anti-nausea or steroids.  Email sent to Elease Etienne to discuss financial support.  Disposition: RTC on 05/11/2018 for lab work and initiation of chemotherapy.  RTC on 05/08/2019 to begin radiation.  Visit Diagnosis 1. Malignant neoplasm of lung, unspecified laterality, unspecified part of lung ([place])     Patient expressed understanding and was in agreement with this plan. He also understands that He can call clinic at any time  with any questions, concerns, or complaints.   A total of (25) minutes of face-to-face time was spent with this patient with greater than 50% of that time in counseling and care-coordination.  Rulon Abide, NP, AGNP-C [facility]: Dr. Grayland Ormond

## 2019-05-05 NOTE — Progress Notes (Signed)
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    INTERVAL HISTORY: Patient returns to clinic today for further evaluation and initiation of cycle 1 of carboplatinum, pemetrexed, and pembrolizumab.  He continues to feel well and remains asymptomatic. He continues to smoke heavily.  He has no neurologic complaints.  He denies any recent fevers or illnesses.  He has a fair appetite, but denies weight loss.  He denies any pain.  He has no chest pain, shortness of breath, cough, or hemoptysis.  He denies any nausea, vomiting, constipation, or diarrhea.  He has no urinary complaints.  Patient offers no specific complaints today.  REVIEW OF SYSTEMS:    Review of Systems  Constitutional: Negative.  Negative for fever, malaise/fatigue and weight loss.  HENT: Negative.  Negative for sore throat.   Respiratory: Negative.  Negative for cough and shortness of breath.   Cardiovascular: Negative.  Negative for chest pain and leg swelling.  Gastrointestinal: Negative.  Negative for abdominal pain.  Genitourinary: Negative.  Negative for dysuria.  Musculoskeletal: Negative.  Negative for back pain.  Skin: Negative.  Negative for rash.  Neurological: Negative.  Negative for dizziness, sensory change, weakness and headaches.  Psychiatric/Behavioral: Negative.  Negative for substance abuse. The patient does not have insomnia.     As per HPI. Otherwise, a complete review of systems is negative.  PAST MEDICAL HISTORY: Past Medical History:  Diagnosis Date  . Alcohol abuse   . Cirrhosis ([place])   . COPD (chronic obstructive pulmonary disease) ([place])   . Dementia ([place])   . Hepatitis C   .  Malnutrition ([place])   . Throat cancer ([place])     PAST SURGICAL HISTORY: Past Surgical History:  Procedure Laterality Date  . IR IMAGING GUIDED PORT INSERTION  04/28/2019  . TONSILLECTOMY     age was 28  . VASECTOMY      FAMILY HISTORY: Family History  Problem Relation Age of Onset  . Breast cancer Mother     ADVANCED DIRECTIVES (Y/N):  N  HEALTH MAINTENANCE: Social History   Tobacco Use  . Smoking status: Current Every Day Smoker    Packs/day: 0.50    Years: 63.00    Pack years: 31.50    Types: Cigarettes  . Smokeless tobacco: Never Used  Substance Use Topics  . Alcohol use: Not Currently    Alcohol/week: 1.0 standard drinks    Types: 1 Cans of beer per week  . Drug use: No     Colonoscopy:  PAP:  Bone density:  Lipid panel:  Allergies  Allergen Reactions  . Penicillins Other (See Comments)    Reaction: unknown Patient is not able to answer follow up questions    Current Outpatient Medications  Medication Sig Dispense Refill  . alum & mag hydroxide-simeth (MAALOX/MYLANTA) 200-200-20 MG/5ML suspension Take 15 mLs by mouth as needed for indigestion or heartburn. 355 mL 0  . buPROPion (WELLBUTRIN SR) 150 MG 12 hr tablet bupropion HCl SR 150 mg tablet,12 hr sustained-release    . Cholecalciferol (VITAMIN D3) 5000 units TABS Take 5,000 Units by mouth daily.    Marland Kitchen docusate (COLACE) 50 MG/5ML liquid Take 100 mg by mouth daily.     . ferrous sulfate  325 (65 FE) MG tablet Take 325 mg by mouth daily with breakfast.    . folic acid (FOLVITE) 1 MG tablet Take 1 mg by mouth daily.    . folic acid (FOLVITE) 1 MG tablet Take 1 tablet (1 mg total) by mouth daily. Start 5-7 days before Alimta chemotherapy. Continue until 21 days after Alimta completed. 100 tablet 3  . hydrOXYzine (ATARAX/VISTARIL) 25 MG tablet Take 100 mg by mouth at bedtime.     . lactose free nutrition (BOOST PLUS) LIQD Take 237 mLs by mouth 4 (four) times daily.    Marland Kitchen levothyroxine (SYNTHROID) 100 MCG tablet  Take 100 mcg by mouth daily before breakfast.    . lidocaine-prilocaine (EMLA) cream Apply to affected area once 30 g 3  . losartan (COZAAR) 25 MG tablet Take 25 mg by mouth daily.    . mirtazapine (REMERON) 15 MG tablet Take 15 mg by mouth at bedtime.    . mupirocin ointment (BACTROBAN) 2 % Apply 1 application topically 2 (two) times daily.    . ondansetron (ZOFRAN) 8 MG tablet Take by mouth every 8 (eight) hours as needed for nausea or vomiting.    . ondansetron (ZOFRAN) 8 MG tablet Take 1 tablet (8 mg total) by mouth 2 (two) times daily as needed (Nausea or vomiting). Start if needed on the third day after chemotherapy. 60 tablet 2  . polyethylene glycol (MIRALAX / GLYCOLAX) 17 g packet Take 17 g by mouth daily.    . prochlorperazine (COMPAZINE) 10 MG tablet Take 1 tablet (10 mg total) by mouth every 6 (six) hours as needed (Nausea or vomiting). 60 tablet 2  . QUEtiapine (SEROQUEL) 25 MG tablet Take 25 mg by mouth 3 (three) times daily.    . QUEtiapine (SEROQUEL) 50 MG tablet Take 1 tablet (50 mg total) by mouth at bedtime. (Patient taking differently: Take 25 mg by mouth 3 (three) times daily. ) 30 tablet 0  . tamsulosin (FLOMAX) 0.4 MG CAPS capsule Take 0.4 mg by mouth 2 (two) times daily.     Marland Kitchen thiamine 100 MG tablet Take 100 mg by mouth daily.    . TRELEGY ELLIPTA 100-62.5-25 MCG/INH AEPB Inhale 1 puff into the lungs in the morning.     . vitamin C (ASCORBIC ACID) 250 MG tablet Take 500 mg by mouth daily.     . Vitamin D, Ergocalciferol, (DRISDOL) 1.25 MG (50000 UNIT) CAPS capsule Take 50,000 Units by mouth every 7 (seven) days.     No current facility-administered medications for this visit.   Facility-Administered Medications Ordered in Other Visits  Medication Dose Route Frequency Provider Last Rate Last Admin  . heparin lock flush 100 unit/mL  500 Units Intravenous Once Lloyd Huger, MD        OBJECTIVE: Vitals:   05/11/19 1022  BP: 119/64  Pulse: 79  Temp: (!) 97.2 F  (36.2 C)     Body mass index is 20.59 kg/m.    ECOG FS:0 - Asymptomatic  General: Thin, no acute distress. Eyes: Pink conjunctiva, anicteric sclera. HEENT: Normocephalic, moist mucous membranes. Lungs: No audible wheezing or coughing. Heart: Regular rate and rhythm. Abdomen: Soft, nontender, no obvious distention. Musculoskeletal: No edema, cyanosis, or clubbing. Neuro: Alert, answering all questions appropriately. Cranial nerves grossly intact. Skin: No rashes or petechiae noted. Psych: Normal affect.   LAB RESULTS:  Lab Results  Component Value Date   NA 135 05/11/2019   K 3.9 05/11/2019   CL 108 05/11/2019  CO2 22 05/11/2019   GLUCOSE 101 (H) 05/11/2019   BUN 17 05/11/2019   CREATININE 1.07 05/11/2019   CALCIUM 8.5 (L) 05/11/2019   PROT 7.7 05/11/2019   ALBUMIN 2.6 (L) 05/11/2019   AST 77 (H) 05/11/2019   ALT 54 (H) 05/11/2019   ALKPHOS 91 05/11/2019   BILITOT 0.9 05/11/2019   GFRNONAA >60 05/11/2019   GFRAA >60 05/11/2019    Lab Results  Component Value Date   WBC 5.6 05/11/2019   NEUTROABS 3.6 05/11/2019   HGB 10.8 (L) 05/11/2019   HCT 31.0 (L) 05/11/2019   MCV 95.1 05/11/2019   PLT 137 (L) 05/11/2019     STUDIES: MR Brain W Wo Contrast  Result Date: 05/01/2019 CLINICAL DATA:  Lung cancer. Screening EXAM: MRI HEAD WITHOUT AND WITH CONTRAST TECHNIQUE: Multiplanar, multiecho pulse sequences of the brain and surrounding structures were obtained without and with intravenous contrast. CONTRAST:  24m GADAVIST GADOBUTROL 1 MMOL/ML IV SOLN COMPARISON:  Head CT September 30, 2016 and with FINDINGS: Brain: No acute infarction, hemorrhage, hydrocephalus or mass lesion. Area encephalomalacia and gliosis involving the right temporal lobe, insula, basal ganglia and frontal lobe consistent with old right MCA territory infarct with associated retraction of the right lateral ventricle. Scattered and confluent foci of T2 hyperintensity are seen within the white matter of the  cerebral hemispheres and within the pons, nonspecific, most likely related to chronic small vessel ischemia. 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 Focal parafalcine dural thickening with heterogeneous contrast enhancement is noted in the right frontal region (series 18, image 35) measuring up to 10 mm. 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 Vascular: Normal flow voids. Skull and upper cervical spine: Normal marrow signal. Sinuses/Orbits: Mucosal thickening of the bilateral maxillary sinuses and ethmoid cells. The orbits are maintained. Other: None. IMPRESSION: 1. No evidence of intracranial metastatic disease. 2. 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 3. Encephalomalacia and gliosis involving the right temporal lobe, insula, basal ganglia and frontal lobe consistent with old right MCA territory infarct. 4. Moderate chronic small vessel ischemia. 5. Inflammatory paranasal sinus disease. Electronically Signed   By: KPedro EarlsM.D.   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 EXAM: NUCLEAR MEDICINE PET SKULL BASE TO THIGH TECHNIQUE: 7.26 mCi F-18 FDG was injected intravenously. Full-ring PET imaging was performed from the skull base to thigh after the radiotracer. CT data was obtained and used for attenuation correction and anatomic localization. Fasting blood glucose: 94 mg/dl COMPARISON:  None. Multiple prior PET examinations most recent from 11/21/2018 and CT of the chest, abdomen pelvis from  03/30/2019 FINDINGS: Mediastinal blood pool activity: SUV max 1.98 Liver activity: SUV max NA NECK: No hypermetabolic lymph nodes in the neck. Incidental CT findings: Evidence of prior right MCA infarct with encephalomalacia partially imaged on today's study. Carotid atherosclerosis bilaterally. Signs of chronic maxillary sinusitis.  This is seen on the left. CHEST: Signs of mediastinal lymphadenopathy. Largest is a bulky subcarinal lymph node measuring 1.9 cm short axis. Size is stable compared to the recent CT from February of  2021, enlarged since the PET scan of November 21, 2018 where it measured 1.4 cm in short axis (SUVmax = 10.0) previously 9.2 (image 104, series 3) Left AP window adenopathy 1.4 cm short axis (image 94, series 3) (SUVmax = 9.8) previously 7.8, size is similar compared to the recent study from February 2021 but increased in size compared to the prior PET scan where it measured 1 cm. Decreased size of precarinal lymph nodes (image 95, series 3) 0.6 cm as compared to 1.1 cm on the previous PET scan (SUVmax = 3.5) previously 7.8 other right paratracheal lymph nodes with similar FDG avidity. Right upper lobe mass abutting the pleura measuring 2.9 x 2.8 cm. This is stable compared to the exam from February of 2021 and is increased from approximately 2.3 x 2.8 cm on the October of 2020 exam (SUVmax = 17) previously 12.9 Incidental CT findings: Small right-sided pleural effusion is unchanged. Small right apical nodule along the right superior mediastinal border is unchanged with max SUV less than background blood pool activity. This measures approximately 7 mm. Signs of background pulmonary emphysema. Subtle ground-glass at the left lung base with suggestion of nodularity (image 118, series 3) largest area measuring approximately 0.8 cm with max SUV of approximately 2.2. Is scattered areas similar to this are found in a segmental distribution. ABDOMEN/PELVIS: No abnormal hypermetabolic activity within  the liver, pancreas, adrenal glands, or spleen. No hypermetabolic lymph nodes in the abdomen or pelvis. Incidental CT findings: Liver displays a cirrhotic morphology. Signs of cholelithiasis. Spleen is normal size. The adrenal glands are normal as is the pancreas on noncontrast imaging. No signs of hydronephrosis. No signs of bowel obstruction or acute bowel process with signs of colonic diverticulosis. SKELETON: Signs of destructive lesion in the left acetabulum with associated increased FDG uptake. This measures approximately 2.2 x 1.5 cm (image 236, series 3) (SUVmax = 15.9) Incidental CT findings: Spinal degenerative changes. IMPRESSION: 1. Enlargement of the right upper lobe mass and subcarinal and AP window lymph nodes since the PET scan of October of 2020, stable since February of this year. Also with increased FDG uptake in these areas when compared to the prior PET scan. 2. Diminished size and FDG uptake within the precarinal lymph node but with stable appearance of other smaller lymph nodes in the chest. 3. 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 4. Small right pleural effusion is similar to prior study with similar pattern of uptake less than background. Attention on follow-up. 5. New signs of left lower lobe airspace disease with scattered areas of ground-glass and nodularity with segmental distribution. Findings may represent infectious or inflammatory sequela. Attention on follow-up. 6. Signs of cirrhosis. 7. Signs of previous CVA and chronic maxillary sinus disease. Electronically Signed   By: Zetta Bills M.D.   On: 04/26/2019 14:10   CT Biopsy  Result Date: 04/12/2019 INDICATION: 74 year old male with a history of squamous cell carcinoma of the esophagus and an enlarging hypermetabolic right upper lobe pulmonary mass concerning for either esophageal metastatic disease or a synchronous primary bronchogenic carcinoma. EXAM: CT-guided biopsy right upper lobe pulmonary nodule  Interventional Radiologist:  Criselda Peaches, MD MEDICATIONS: None. ANESTHESIA/SEDATION: Fentanyl 25 mcg IV; Versed 0.5 mg IV Moderate Sedation Time:  13 minutes The patient was continuously monitored during the procedure by the interventional radiology nurse under my direct supervision. FLUOROSCOPY TIME:  None. COMPLICATIONS: None immediate. Estimated blood loss:  0 PROCEDURE: Informed written consent was obtained from the patient after a thorough  discussion of the procedural risks, benefits and alternatives. All questions were addressed. Maximal Sterile Barrier Technique was utilized including caps, mask, sterile gowns, sterile gloves, sterile drape, hand hygiene and skin antiseptic. A timeout was performed prior to the initiation of the procedure. A planning axial CT scan was performed. The nodule in the right upper lobe was successfully identified. A suitable skin entry site was selected and marked. The region was then sterilely prepped and draped in standard fashion using Betadine skin prep. Local anesthesia was attained by infiltration with 1% lidocaine. A small dermatotomy was made. Under intermittent CT fluoroscopic guidance, a 17 gauge trocar needle was advanced into the lung and positioned at the margin of the nodule. Multiple 18 gauge core biopsies were then coaxially obtained using the BioPince automated biopsy device. Biopsy specimens were placed in formalin and delivered to pathology for further analysis. The biopsy device and introducer needle were removed. A bio sentry device was deployed. Post biopsy axial CT imaging demonstrates no evidence of immediate complication. There is no pneumothorax. Mild perilesional alveolar hemorrhage is not unexpected. The patient tolerated the procedure well. IMPRESSION: Technically successful CT-guided biopsy right upper lobe pulmonary nodule. Electronically Signed   By: Jacqulynn Cadet M.D.   On: 04/12/2019 17:24   DG Chest Port 1 View  Result Date:  04/12/2019 CLINICAL DATA:  74 year old male status post biopsy of right upper lobe pulmonary mass EXAM: PORTABLE CHEST 1 VIEW COMPARISON:  CT scan of the chest 03/30/2019 FINDINGS: No evidence of pneumothorax. Right upper lobe nodular opacity consistent with known underlying pulmonary mass. Haziness surrounding the nodule consistent with mild alveolar hemorrhage. Cardiac and mediastinal contours remain unchanged and within normal limits. No acute osseous abnormality. IMPRESSION: No evidence of pneumothorax following right upper lobe pulmonary mass biopsy. Electronically Signed   By: Jacqulynn Cadet M.D.   On: 04/12/2019 16:03   IR IMAGING GUIDED PORT INSERTION  Result Date: 04/28/2019 INDICATION: History of lung cancer. In need of durable intravenous access for chemotherapy administration. EXAM: IMPLANTED PORT A CATH PLACEMENT WITH ULTRASOUND AND FLUOROSCOPIC GUIDANCE COMPARISON:  PET-CT-04/26/2019 MEDICATIONS: Vancomycin 1 gm IV; The antibiotic was administered within an appropriate time interval prior to skin puncture. ANESTHESIA/SEDATION: Moderate (conscious) sedation was employed during this procedure. A total of Versed 1 mg and Fentanyl 50 mcg was administered intravenously. Moderate Sedation Time: 25 minutes. The patient's level of consciousness and vital signs were monitored continuously by radiology nursing throughout the procedure under my direct supervision. CONTRAST:  None FLUOROSCOPY TIME:  18 seconds (1.8 mGy) COMPLICATIONS: None immediate. PROCEDURE: The procedure, risks, benefits, and alternatives were explained to the patient. Questions regarding the procedure were encouraged and answered. The patient understands and consents to the procedure. The right neck and chest were prepped with chlorhexidine in a sterile fashion, and a sterile drape was applied covering the operative field. Maximum barrier sterile technique with sterile gowns and gloves were used for the procedure. A timeout was  performed prior to the initiation of the procedure. Local anesthesia was provided with 1% lidocaine with epinephrine. After creating a small venotomy incision, a micropuncture kit was utilized to access the internal jugular vein. Real-time ultrasound guidance was utilized for vascular access including the acquisition of a permanent ultrasound image documenting patency of the accessed vessel. The microwire was utilized to measure appropriate catheter length. A subcutaneous port pocket was then created along the upper chest wall utilizing a combination of sharp and blunt dissection. The pocket was irrigated with sterile saline. A single  lumen "standard sized" power injectable port was chosen for placement. The 8 Fr catheter was tunneled from the port pocket site to the venotomy incision. The port was placed in the pocket. The external catheter was trimmed to appropriate length. At the venotomy, an 8 Fr peel-away sheath was placed over a guidewire under fluoroscopic guidance. The catheter was then placed through the sheath and the sheath was removed. Final catheter positioning was confirmed and documented with a fluoroscopic spot radiograph. The port was accessed with a Huber needle, aspirated and flushed with heparinized saline. The venotomy site was closed with an interrupted 4-0 Vicryl suture. The port pocket incision was closed with interrupted 2-0 Vicryl suture. The skin was opposed with a running subcuticular 4-0 Vicryl suture. Dermabond and Steri-strips were applied to both incisions. Dressings were applied. The patient tolerated the procedure well without immediate post procedural complication. FINDINGS: After catheter placement, the tip lies within the superior cavoatrial junction. The catheter aspirates and flushes normally and is ready for immediate use. IMPRESSION: Successful placement of a right internal jugular approach power injectable Port-A-Cath. The catheter is ready for immediate use. Electronically  Signed   By: Sandi Mariscal M.D.   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    PLAN:    1.  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  Patient has a new left acetabular metastasis confirming stage IV disease.  MRI of the brain on May 01, 2019 did not reveal metastatic disease.  Ominseq testing for PD-L1 is pending at time of dictation.  Patient has had port placement.  Continue daily XRT for palliative treatment to his acetabulum.  Plan to give carboplatinum, pemetrexed, and Keytruda every 3 weeks for 4 cycles, patient will then maintenance pemetrexed and Keytruda.  Proceed with cycle 1 of treatment today.  Return to clinic in 1 week for laboratory work and further evaluation and then in 3 weeks for laboratory work, further evaluation and continuation of treatment.  2. 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  Patient received his last dose of weekly cisplatin on May 04, 2016.  3. Social situation: Patient has a legal guardian.  I spent a total of 30 minutes reviewing chart data, face-to-face evaluation with the patient, counseling and coordination of care as detailed above.    Patient expressed understanding and was in agreement with this plan. He also understands that He can call clinic at any time with any questions, concerns, or complaints.    Cancer Staging Adenocarcinoma, lung, right [facility]) Staging form: Lung, AJCC 8th Edition - Clinical stage from 05/02/2019: Stage IVB (cT1c, cN2, cM1c) - Signed by Lloyd Huger, MD on 05/02/2019  Primary cancer of larynx [facility]) Staging form: Larynx - Supraglottis, AJCC 8th Edition - Clinical stage from 02/27/2016:  Stage IVA (cT1, cN2b, cM0) - Signed by Lloyd Huger, MD on 02/27/2016   Lloyd Huger, MD 05/11/19 2:15 PM

## 2019-05-06 LAB — T4: T4, Total: 8.4 ug/dL (ref 4.5–12.0)

## 2019-05-08 ENCOUNTER — Other Ambulatory Visit: Payer: Self-pay | Admitting: Oncology

## 2019-05-08 ENCOUNTER — Ambulatory Visit: Admission: RE | Admit: 2019-05-08 | Payer: Medicare Other | Source: Ambulatory Visit

## 2019-05-08 DIAGNOSIS — Z510 Encounter for antineoplastic radiation therapy: Secondary | ICD-10-CM | POA: Diagnosis not present

## 2019-05-09 ENCOUNTER — Ambulatory Visit
Admission: RE | Admit: 2019-05-09 | Discharge: 2019-05-09 | Disposition: A | Discharge status: Home / Self Care | Payer: Medicare Other | Source: Ambulatory Visit | Attending: Radiation Oncology | Admitting: Radiation Oncology

## 2019-05-09 DIAGNOSIS — Z510 Encounter for antineoplastic radiation therapy: Secondary | ICD-10-CM | POA: Diagnosis not present

## 2019-05-10 ENCOUNTER — Ambulatory Visit
Admission: RE | Admit: 2019-05-10 | Discharge: 2019-05-10 | Disposition: A | Discharge status: Home / Self Care | Payer: Medicare Other | Source: Ambulatory Visit | Attending: Radiation Oncology | Admitting: Radiation Oncology

## 2019-05-10 ENCOUNTER — Encounter: Payer: Self-pay | Admitting: Oncology

## 2019-05-10 DIAGNOSIS — Z510 Encounter for antineoplastic radiation therapy: Secondary | ICD-10-CM | POA: Diagnosis not present

## 2019-05-11 ENCOUNTER — Encounter: Payer: Self-pay | Admitting: Oncology

## 2019-05-11 ENCOUNTER — Inpatient Hospital Stay: Payer: Medicare Other

## 2019-05-11 ENCOUNTER — Ambulatory Visit
Admission: RE | Admit: 2019-05-11 | Discharge: 2019-05-11 | Disposition: A | Discharge status: Home / Self Care | Payer: Medicare Other | Source: Ambulatory Visit | Attending: Radiation Oncology | Admitting: Radiation Oncology

## 2019-05-11 ENCOUNTER — Encounter: Payer: Self-pay | Admitting: *Deleted

## 2019-05-11 ENCOUNTER — Inpatient Hospital Stay (HOSPITAL_BASED_OUTPATIENT_CLINIC_OR_DEPARTMENT_OTHER): Payer: Medicare Other | Admitting: Oncology

## 2019-05-11 VITALS — BP 119/64 | HR 79 | Temp 97.2°F | Wt 133.4 lb

## 2019-05-11 DIAGNOSIS — C3491 Malignant neoplasm of unspecified part of right bronchus or lung: Secondary | ICD-10-CM | POA: Diagnosis not present

## 2019-05-11 DIAGNOSIS — Z510 Encounter for antineoplastic radiation therapy: Secondary | ICD-10-CM | POA: Diagnosis not present

## 2019-05-11 DIAGNOSIS — Z95828 Presence of other vascular implants and grafts: Secondary | ICD-10-CM

## 2019-05-11 DIAGNOSIS — Z5112 Encounter for antineoplastic immunotherapy: Secondary | ICD-10-CM | POA: Diagnosis not present

## 2019-05-11 LAB — CBC WITH DIFFERENTIAL/PLATELET
Abs Immature Granulocytes: 0.03 10*3/uL (ref 0.00–0.07)
Basophils Absolute: 0 10*3/uL (ref 0.0–0.1)
Basophils Relative: 0 %
Eosinophils Absolute: 0.1 10*3/uL (ref 0.0–0.5)
Eosinophils Relative: 2 %
HCT: 31 % — ABNORMAL LOW (ref 39.0–52.0)
Hemoglobin: 10.8 g/dL — ABNORMAL LOW (ref 13.0–17.0)
Immature Granulocytes: 1 %
Lymphocytes Relative: 19 %
Lymphs Abs: 1.1 10*3/uL (ref 0.7–4.0)
MCH: 33.1 pg (ref 26.0–34.0)
MCHC: 34.8 g/dL (ref 30.0–36.0)
MCV: 95.1 fL (ref 80.0–100.0)
Monocytes Absolute: 0.8 10*3/uL (ref 0.1–1.0)
Monocytes Relative: 15 %
Neutro Abs: 3.6 10*3/uL (ref 1.7–7.7)
Neutrophils Relative %: 63 %
Platelets: 137 10*3/uL — ABNORMAL LOW (ref 150–400)
RBC: 3.26 MIL/uL — ABNORMAL LOW (ref 4.22–5.81)
RDW: 14.6 % (ref 11.5–15.5)
WBC: 5.6 10*3/uL (ref 4.0–10.5)
nRBC: 0 % (ref 0.0–0.2)

## 2019-05-11 LAB — COMPREHENSIVE METABOLIC PANEL
ALT: 54 U/L — ABNORMAL HIGH (ref 0–44)
AST: 77 U/L — ABNORMAL HIGH (ref 15–41)
Albumin: 2.6 g/dL — ABNORMAL LOW (ref 3.5–5.0)
Alkaline Phosphatase: 91 U/L (ref 38–126)
Anion gap: 5 (ref 5–15)
BUN: 17 mg/dL (ref 8–23)
CO2: 22 mmol/L (ref 22–32)
Calcium: 8.5 mg/dL — ABNORMAL LOW (ref 8.9–10.3)
Chloride: 108 mmol/L (ref 98–111)
Creatinine, Ser: 1.07 mg/dL (ref 0.61–1.24)
GFR calc Af Amer: >60 mL/min (ref >60)
GFR calc non Af Amer: >60 mL/min (ref >60)
Glucose, Bld: 101 mg/dL — ABNORMAL HIGH (ref 70–99)
Potassium: 3.9 mmol/L (ref 3.5–5.1)
Sodium: 135 mmol/L (ref 135–145)
Total Bilirubin: 0.9 mg/dL (ref 0.3–1.2)
Total Protein: 7.7 g/dL (ref 6.5–8.1)

## 2019-05-11 LAB — TSH: TSH: 8.555 u[IU]/mL — ABNORMAL HIGH (ref 0.350–4.500)

## 2019-05-11 MED ORDER — SODIUM CHLORIDE 0.9 % IV SOLN
10.0000 mg | Freq: Once | INTRAVENOUS | Status: AC
  Administered 2019-05-11: 10 mg via INTRAVENOUS
  Filled 2019-05-11: qty 10

## 2019-05-11 MED ORDER — PALONOSETRON HCL INJECTION 0.25 MG/5ML
0.2500 mg | Freq: Once | INTRAVENOUS | Status: AC
  Administered 2019-05-11: 0.25 mg via INTRAVENOUS
  Filled 2019-05-11: qty 5

## 2019-05-11 MED ORDER — SODIUM CHLORIDE 0.9 % IV SOLN
200.0000 mg | Freq: Once | INTRAVENOUS | Status: AC
  Administered 2019-05-11: 200 mg via INTRAVENOUS
  Filled 2019-05-11: qty 8

## 2019-05-11 MED ORDER — SODIUM CHLORIDE 0.9 % IV SOLN
377.0000 mg | Freq: Once | INTRAVENOUS | Status: AC
  Administered 2019-05-11: 380 mg via INTRAVENOUS
  Filled 2019-05-11: qty 38

## 2019-05-11 MED ORDER — SODIUM CHLORIDE 0.9 % IV SOLN
150.0000 mg | Freq: Once | INTRAVENOUS | Status: AC
  Administered 2019-05-11: 150 mg via INTRAVENOUS
  Filled 2019-05-11: qty 150

## 2019-05-11 MED ORDER — HEPARIN SOD (PORK) LOCK FLUSH 100 UNIT/ML IV SOLN
INTRAVENOUS | Status: AC
  Filled 2019-05-11: qty 5

## 2019-05-11 MED ORDER — HEPARIN SOD (PORK) LOCK FLUSH 100 UNIT/ML IV SOLN
500.0000 [IU] | Freq: Once | INTRAVENOUS | Status: AC | PRN
  Administered 2019-05-11: 500 [IU]
  Filled 2019-05-11: qty 5

## 2019-05-11 MED ORDER — SODIUM CHLORIDE 0.9 % IV SOLN
500.0000 mg/m2 | Freq: Once | INTRAVENOUS | Status: AC
  Administered 2019-05-11: 850 mg via INTRAVENOUS
  Filled 2019-05-11: qty 20

## 2019-05-11 MED ORDER — SODIUM CHLORIDE 0.9% FLUSH
10.0000 mL | Freq: Once | INTRAVENOUS | Status: AC
  Administered 2019-05-11: 10 mL via INTRAVENOUS
  Filled 2019-05-11: qty 10

## 2019-05-11 MED ORDER — SODIUM CHLORIDE 0.9 % IV SOLN
Freq: Once | INTRAVENOUS | Status: AC
  Filled 2019-05-11: qty 250

## 2019-05-11 MED ORDER — CYANOCOBALAMIN 1000 MCG/ML IJ SOLN
1000.0000 ug | Freq: Once | INTRAMUSCULAR | Status: AC
  Administered 2019-05-11: 1000 ug via INTRAMUSCULAR
  Filled 2019-05-11: qty 1

## 2019-05-11 NOTE — Progress Notes (Signed)
  Oncology Nurse Navigator Documentation  Navigator Location: [facility] Onc (05/11/19 1100)   )Navigator Encounter Type: Treatment (05/11/19 1100)                   Treatment Initiated Date: 05/11/19 (05/11/19 1100) Patient Visit Type: MedOnc;RadOnc (05/11/19 1100) Treatment Phase: First Chemo Tx (05/11/19 1100) Barriers/Navigation Needs: No Barriers At This Time (05/11/19 1100)   Interventions: None Required (05/11/19 1100)           met with patient prior to receiving first chemo treatment today. All questions answered during visit. Reviewed upcoming appts with pt and his caregiver. Instructed to call with any further questions or needs. Pt and his caregiver verbalized understanding. Nothing further needed at this time.           Time Spent with Patient: 30 (05/11/19 1100)

## 2019-05-11 NOTE — Progress Notes (Signed)
Pt tolerated Keytruda, Alimta, and Carboplatin infusion well with no signs of complications or reactions. RN educated pt on the importance of notifying the clinic if any signs of complications occur at home or call 911 if it is an emergency. Pt verbalized understanding and stable for discharge.   Clydell Alberts [employer]

## 2019-05-11 NOTE — Progress Notes (Signed)
PSN left a voice message with patient's caregiver, Madelynn Done, to call to discuss the patient's financial concerns.

## 2019-05-12 ENCOUNTER — Ambulatory Visit
Admission: RE | Admit: 2019-05-12 | Discharge: 2019-05-12 | Disposition: A | Discharge status: Home / Self Care | Payer: Medicare Other | Source: Ambulatory Visit | Attending: Radiation Oncology | Admitting: Radiation Oncology

## 2019-05-12 DIAGNOSIS — Z510 Encounter for antineoplastic radiation therapy: Secondary | ICD-10-CM | POA: Diagnosis not present

## 2019-05-12 LAB — T4: T4, Total: 7.1 ug/dL (ref 4.5–12.0)

## 2019-05-13 NOTE — Progress Notes (Signed)
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    INTERVAL HISTORY: Patient returns to clinic today for further evaluation and to assess his toleration of cycle 1 of carboplatinum, pemetrexed, and pembrolizumab.  He admits to increased fatigue and lethargy, but otherwise tolerated his treatment well.  He continues to smoke heavily.  He has no neurologic complaints.  He denies any recent fevers or illnesses.  He has a fair appetite, but denies weight loss.  He denies any pain.  He has no chest pain, shortness of breath, cough, or hemoptysis.  He denies any nausea, vomiting, constipation, or diarrhea.  He has no urinary complaints.  Patient offers no further specific complaints today.  REVIEW OF SYSTEMS:    Review of Systems  Constitutional: Positive for malaise/fatigue. Negative for fever and weight loss.  HENT: Negative.  Negative for sore throat.   Respiratory: Negative.  Negative for cough and shortness of breath.   Cardiovascular: Negative.  Negative for chest pain and leg swelling.  Gastrointestinal: Negative.  Negative for abdominal pain.  Genitourinary: Negative.  Negative for dysuria.  Musculoskeletal: Negative.  Negative for back pain.  Skin: Negative.  Negative for rash.  Neurological: Negative.  Negative for dizziness, sensory change, focal weakness, weakness and headaches.  Psychiatric/Behavioral: Negative.  Negative for substance abuse. The patient does not have insomnia.     As per HPI. Otherwise, a complete review of systems is negative.  PAST MEDICAL HISTORY: Past Medical History:  Diagnosis Date  .  Alcohol abuse   . Cirrhosis ([place])   . COPD (chronic obstructive pulmonary disease) ([place])   . Dementia ([place])   . Hepatitis C   . Malnutrition ([place])   . Throat cancer ([place])     PAST SURGICAL HISTORY: Past Surgical History:  Procedure Laterality Date  . IR IMAGING GUIDED PORT INSERTION  04/28/2019  . TONSILLECTOMY     age was 63  . VASECTOMY      FAMILY HISTORY: Family History  Problem Relation Age of Onset  . Breast cancer Mother     ADVANCED DIRECTIVES (Y/N):  N  HEALTH MAINTENANCE: Social History   Tobacco Use  . Smoking status: Current Every Day Smoker    Packs/day: 0.50    Years: 63.00    Pack years: 31.50    Types: Cigarettes  . Smokeless tobacco: Never Used  Substance Use Topics  . Alcohol use: Not Currently    Alcohol/week: 1.0 standard drinks    Types: 1 Cans of beer per week  . Drug use: No     Colonoscopy:  PAP:  Bone density:  Lipid panel:  Allergies  Allergen Reactions  . Penicillins Other (See Comments)    Reaction: unknown Patient is not able to answer follow up questions    Current Outpatient Medications  Medication Sig Dispense Refill  . alum & mag hydroxide-simeth (MAALOX/MYLANTA) 200-200-20 MG/5ML suspension Take 15 mLs by mouth as needed for indigestion or heartburn. 355 mL 0  . buPROPion (WELLBUTRIN SR) 150 MG 12 hr tablet bupropion HCl SR 150 mg tablet,12 hr sustained-release    . Cholecalciferol (VITAMIN D3) 5000 units TABS Take 5,000 Units by mouth daily.    Marland Kitchen  docusate (COLACE) 50 MG/5ML liquid Take 100 mg by mouth daily.     . ferrous sulfate 325 (65 FE) MG tablet Take 325 mg by mouth daily with breakfast.    . folic acid (FOLVITE) 1 MG tablet Take 1 mg by mouth daily.    . folic acid (FOLVITE) 1 MG tablet Take 1 tablet (1 mg total) by mouth daily. Start 5-7 days before Alimta chemotherapy. Continue until 21 days after Alimta completed. 100 tablet 3  . hydrOXYzine (ATARAX/VISTARIL) 25 MG tablet Take 100 mg by mouth at bedtime.       . lactose free nutrition (BOOST PLUS) LIQD Take 237 mLs by mouth 4 (four) times daily.    Marland Kitchen levothyroxine (SYNTHROID) 100 MCG tablet Take 100 mcg by mouth daily before breakfast.    . lidocaine-prilocaine (EMLA) cream Apply to affected area once 30 g 3  . losartan (COZAAR) 25 MG tablet Take 25 mg by mouth daily.    . mirtazapine (REMERON) 15 MG tablet Take 15 mg by mouth at bedtime.    . mupirocin ointment (BACTROBAN) 2 % Apply 1 application topically 2 (two) times daily.    . ondansetron (ZOFRAN) 8 MG tablet Take by mouth every 8 (eight) hours as needed for nausea or vomiting.    . ondansetron (ZOFRAN) 8 MG tablet Take 1 tablet (8 mg total) by mouth 2 (two) times daily as needed (Nausea or vomiting). Start if needed on the third day after chemotherapy. 60 tablet 2  . polyethylene glycol (MIRALAX / GLYCOLAX) 17 g packet Take 17 g by mouth daily.    . prochlorperazine (COMPAZINE) 10 MG tablet Take 1 tablet (10 mg total) by mouth every 6 (six) hours as needed (Nausea or vomiting). 60 tablet 2  . QUEtiapine (SEROQUEL) 25 MG tablet Take 25 mg by mouth 3 (three) times daily.    . QUEtiapine (SEROQUEL) 50 MG tablet Take 1 tablet (50 mg total) by mouth at bedtime. (Patient taking differently: Take 25 mg by mouth 3 (three) times daily. ) 30 tablet 0  . tamsulosin (FLOMAX) 0.4 MG CAPS capsule Take 0.4 mg by mouth 2 (two) times daily.     Marland Kitchen thiamine 100 MG tablet Take 100 mg by mouth daily.    . TRELEGY ELLIPTA 100-62.5-25 MCG/INH AEPB Inhale 1 puff into the lungs in the morning.     . vitamin C (ASCORBIC ACID) 250 MG tablet Take 500 mg by mouth daily.     . Vitamin D, Ergocalciferol, (DRISDOL) 1.25 MG (50000 UNIT) CAPS capsule Take 50,000 Units by mouth every 7 (seven) days.     No current facility-administered medications for this visit.   Facility-Administered Medications Ordered in Other Visits  Medication Dose Route Frequency Provider Last Rate Last Admin  . heparin lock flush 100 unit/mL  500  Units Intravenous Once Lloyd Huger, MD        OBJECTIVE: Vitals:   05/18/19 1033  BP: 114/67  Pulse: 78  Resp: 18  Temp: (!) 96.3 F (35.7 C)  SpO2: 100%     Body mass index is 20.17 kg/m.    ECOG FS:0 - Asymptomatic  General: Thin, no acute distress. Eyes: Pink conjunctiva, anicteric sclera. HEENT: Normocephalic, moist mucous membranes. Lungs: No audible wheezing or coughing. Heart: Regular rate and rhythm. Abdomen: Soft, nontender, no obvious distention. Musculoskeletal: No edema, cyanosis, or clubbing. Neuro: Alert, answering all questions appropriately. Cranial nerves grossly intact. Skin: No rashes or petechiae noted. Psych: Normal affect.  LAB RESULTS:  Lab Results  Component Value Date   NA 135 05/18/2019   K 4.2 05/18/2019   CL 104 05/18/2019   CO2 25 05/18/2019   GLUCOSE 85 05/18/2019   BUN 23 05/18/2019   CREATININE 1.10 05/18/2019   CALCIUM 9.0 05/18/2019   PROT 7.8 05/18/2019   ALBUMIN 2.6 (L) 05/18/2019   AST 71 (H) 05/18/2019   ALT 63 (H) 05/18/2019   ALKPHOS 92 05/18/2019   BILITOT 1.2 05/18/2019   GFRNONAA >60 05/18/2019   GFRAA >60 05/18/2019    Lab Results  Component Value Date   WBC 2.0 (L) 05/18/2019   NEUTROABS 1.1 (L) 05/18/2019   HGB 10.5 (L) 05/18/2019   HCT 30.9 (L) 05/18/2019   MCV 96.3 05/18/2019   PLT 79 (L) 05/18/2019     STUDIES: MR Brain W Wo Contrast  Result Date: 05/01/2019 CLINICAL DATA:  Lung cancer. Screening EXAM: MRI HEAD WITHOUT AND WITH CONTRAST TECHNIQUE: Multiplanar, multiecho pulse sequences of the brain and surrounding structures were obtained without and with intravenous contrast. CONTRAST:  30m GADAVIST GADOBUTROL 1 MMOL/ML IV SOLN COMPARISON:  Head CT September 30, 2016 and with FINDINGS: Brain: No acute infarction, hemorrhage, hydrocephalus or mass lesion. Area encephalomalacia and gliosis involving the right temporal lobe, insula, basal ganglia and frontal lobe consistent with old right MCA territory  infarct with associated retraction of the right lateral ventricle. Scattered and confluent foci of T2 hyperintensity are seen within the white matter of the cerebral hemispheres and within the pons, nonspecific, most likely related to chronic small vessel ischemia. 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 Focal parafalcine dural thickening with heterogeneous contrast enhancement is noted in the right frontal region (series 18, image 35) measuring up to 10 mm. 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 Vascular: Normal flow voids. Skull and upper cervical spine: Normal marrow signal. Sinuses/Orbits: Mucosal thickening of the bilateral maxillary sinuses and ethmoid cells. The orbits are maintained. Other: None. IMPRESSION: 1. No evidence of intracranial metastatic disease. 2. 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 3. Encephalomalacia and gliosis involving the right temporal lobe, insula, basal ganglia and frontal lobe consistent with old right MCA territory infarct. 4. Moderate chronic small vessel ischemia. 5. Inflammatory paranasal sinus disease. Electronically Signed   By: KPedro EarlsM.D.   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 EXAM: NUCLEAR MEDICINE PET SKULL BASE TO THIGH TECHNIQUE: 7.26 mCi F-18 FDG was injected intravenously. Full-ring PET imaging was performed from the skull base to thigh after the radiotracer. CT data was obtained and used for attenuation correction and anatomic localization.  Fasting blood glucose: 94 mg/dl COMPARISON:  None. Multiple prior PET examinations most recent from 11/21/2018 and CT of the chest, abdomen pelvis from 03/30/2019 FINDINGS: Mediastinal blood pool activity: SUV max 1.98 Liver activity: SUV max NA NECK: No hypermetabolic lymph nodes in the neck. Incidental CT findings: Evidence of prior right MCA infarct with encephalomalacia partially imaged on today's study. Carotid atherosclerosis bilaterally. Signs of chronic maxillary sinusitis.  This is seen on the left. CHEST: Signs of mediastinal lymphadenopathy.  Largest is a bulky subcarinal lymph node measuring 1.9 cm short axis. Size is stable compared to the recent CT from February of 2021, enlarged since the PET scan of November 21, 2018 where it measured 1.4 cm in short axis (SUVmax = 10.0) previously 9.2 (image 104, series 3) Left AP window adenopathy 1.4 cm short axis (image 94, series 3) (SUVmax = 9.8) previously 7.8, size is similar compared to the recent study from February 2021 but increased in size compared to the prior PET scan where it measured 1 cm. Decreased size of precarinal lymph nodes (image 95, series 3) 0.6 cm as compared to 1.1 cm on the previous PET scan (SUVmax = 3.5) previously 7.8 other right paratracheal lymph nodes with similar FDG avidity. Right upper lobe mass abutting the pleura measuring 2.9 x 2.8 cm. This is stable compared to the exam from February of 2021 and is increased from approximately 2.3 x 2.8 cm on the October of 2020 exam (SUVmax = 17) previously 12.9 Incidental CT findings: Small right-sided pleural effusion is unchanged. Small right apical nodule along the right superior mediastinal border is unchanged with max SUV less than background blood pool activity. This measures approximately 7 mm. Signs of background pulmonary emphysema. Subtle ground-glass at the left lung base with suggestion of nodularity (image 118, series 3) largest area measuring approximately 0.8 cm with max SUV of  approximately 2.2. Is scattered areas similar to this are found in a segmental distribution. ABDOMEN/PELVIS: No abnormal hypermetabolic activity within the liver, pancreas, adrenal glands, or spleen. No hypermetabolic lymph nodes in the abdomen or pelvis. Incidental CT findings: Liver displays a cirrhotic morphology. Signs of cholelithiasis. Spleen is normal size. The adrenal glands are normal as is the pancreas on noncontrast imaging. No signs of hydronephrosis. No signs of bowel obstruction or acute bowel process with signs of colonic diverticulosis. SKELETON: Signs of destructive lesion in the left acetabulum with associated increased FDG uptake. This measures approximately 2.2 x 1.5 cm (image 236, series 3) (SUVmax = 15.9) Incidental CT findings: Spinal degenerative changes. IMPRESSION: 1. Enlargement of the right upper lobe mass and subcarinal and AP window lymph nodes since the PET scan of October of 2020, stable since February of this year. Also with increased FDG uptake in these areas when compared to the prior PET scan. 2. Diminished size and FDG uptake within the precarinal lymph node but with stable appearance of other smaller lymph nodes in the chest. 3. 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 4. Small right pleural effusion is similar to prior study with similar pattern of uptake less than background. Attention on follow-up. 5. New signs of left lower lobe airspace disease with scattered areas of ground-glass and nodularity with segmental distribution. Findings may represent infectious or inflammatory sequela. Attention on follow-up. 6. Signs of cirrhosis. 7. Signs of previous CVA and chronic maxillary sinus disease. Electronically Signed   By: Zetta Bills M.D.   On: 04/26/2019 14:10   IR IMAGING GUIDED PORT INSERTION  Result Date: 04/28/2019 INDICATION: History of lung cancer. In need of durable intravenous access for chemotherapy administration. EXAM: IMPLANTED PORT A CATH PLACEMENT  WITH ULTRASOUND AND FLUOROSCOPIC GUIDANCE COMPARISON:  PET-CT-04/26/2019 MEDICATIONS: Vancomycin 1 gm IV; The antibiotic was administered within an appropriate time interval prior to skin puncture. ANESTHESIA/SEDATION: Moderate (conscious) sedation was employed during this procedure. A total of Versed 1 mg and Fentanyl 50 mcg was administered intravenously. Moderate Sedation Time: 25 minutes. The patient's level of consciousness and vital signs  were monitored continuously by radiology nursing throughout the procedure under my direct supervision. CONTRAST:  None FLUOROSCOPY TIME:  18 seconds (1.8 mGy) COMPLICATIONS: None immediate. PROCEDURE: The procedure, risks, benefits, and alternatives were explained to the patient. Questions regarding the procedure were encouraged and answered. The patient understands and consents to the procedure. The right neck and chest were prepped with chlorhexidine in a sterile fashion, and a sterile drape was applied covering the operative field. Maximum barrier sterile technique with sterile gowns and gloves were used for the procedure. A timeout was performed prior to the initiation of the procedure. Local anesthesia was provided with 1% lidocaine with epinephrine. After creating a small venotomy incision, a micropuncture kit was utilized to access the internal jugular vein. Real-time ultrasound guidance was utilized for vascular access including the acquisition of a permanent ultrasound image documenting patency of the accessed vessel. The microwire was utilized to measure appropriate catheter length. A subcutaneous port pocket was then created along the upper chest wall utilizing a combination of sharp and blunt dissection. The pocket was irrigated with sterile saline. A single lumen "standard sized" power injectable port was chosen for placement. The 8 Fr catheter was tunneled from the port pocket site to the venotomy incision. The port was placed in the pocket. The external  catheter was trimmed to appropriate length. At the venotomy, an 8 Fr peel-away sheath was placed over a guidewire under fluoroscopic guidance. The catheter was then placed through the sheath and the sheath was removed. Final catheter positioning was confirmed and documented with a fluoroscopic spot radiograph. The port was accessed with a Huber needle, aspirated and flushed with heparinized saline. The venotomy site was closed with an interrupted 4-0 Vicryl suture. The port pocket incision was closed with interrupted 2-0 Vicryl suture. The skin was opposed with a running subcuticular 4-0 Vicryl suture. Dermabond and Steri-strips were applied to both incisions. Dressings were applied. The patient tolerated the procedure well without immediate post procedural complication. FINDINGS: After catheter placement, the tip lies within the superior cavoatrial junction. The catheter aspirates and flushes normally and is ready for immediate use. IMPRESSION: Successful placement of a right internal jugular approach power injectable Port-A-Cath. The catheter is ready for immediate use. Electronically Signed   By: Sandi Mariscal M.D.   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    PLAN:    1.  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  Patient has a new left acetabular metastasis confirming stage IV disease.  MRI of the brain on May 01, 2019 did not reveal metastatic disease.  Ominseq testing for PD-L1 is pending at time of dictation.  Patient has had port placement.  Continue daily XRT for palliative treatment to his acetabulum.  Plan to give carboplatinum, pemetrexed, and Keytruda every 3 weeks for 4 cycles, patient will then maintenance pemetrexed and Keytruda.  Patient  tolerated cycle 1 of treatment relatively well last week.  Return to clinic in 2 weeks as previously scheduled for further evaluation and consideration of cycle 2. 2. 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  Patient received his last dose of weekly cisplatin on May 04, 2016.  3. Social situation: Patient has a legal guardian. 4.  Fatigue: Patient will receive 1 L of IV fluids as long as 10 mg Decadron. 5.  Pancytopenia: Secondary to chemotherapy, monitor.   Patient expressed understanding and was in agreement with this plan. He also understands that He can call clinic at any time with any questions, concerns, or complaints.    Cancer Staging Adenocarcinoma, lung, right [facility]) Staging form: Lung, AJCC 8th Edition - Clinical stage from 05/02/2019: Stage IVB (cT1c, cN2, cM1c) - Signed by Lloyd Huger, MD on 05/02/2019  Primary cancer of larynx [facility]) Staging form: Larynx - Supraglottis, AJCC 8th Edition - Clinical stage from 02/27/2016: Stage IVA (cT1, cN2b, cM0) - Signed by Lloyd Huger, MD on 02/27/2016   Lloyd Huger, MD 05/18/19 12:14 PM

## 2019-05-15 ENCOUNTER — Telehealth: Payer: Self-pay

## 2019-05-15 ENCOUNTER — Ambulatory Visit
Admission: RE | Admit: 2019-05-15 | Discharge: 2019-05-15 | Disposition: A | Discharge status: Home / Self Care | Payer: Medicare Other | Source: Ambulatory Visit | Attending: Radiation Oncology | Admitting: Radiation Oncology

## 2019-05-15 DIAGNOSIS — Z510 Encounter for antineoplastic radiation therapy: Secondary | ICD-10-CM | POA: Diagnosis not present

## 2019-05-15 NOTE — Telephone Encounter (Signed)
Telephone call to pt for follow up after receiving first chemo.  No answer but left message to let pt know I was calling to check on him and to call for any questions or concerns.

## 2019-05-16 ENCOUNTER — Ambulatory Visit
Admission: RE | Admit: 2019-05-16 | Discharge: 2019-05-16 | Disposition: A | Discharge status: Home / Self Care | Payer: Medicare Other | Source: Ambulatory Visit | Attending: Radiation Oncology | Admitting: Radiation Oncology

## 2019-05-16 DIAGNOSIS — Z510 Encounter for antineoplastic radiation therapy: Secondary | ICD-10-CM | POA: Diagnosis not present

## 2019-05-16 DIAGNOSIS — C7951 Secondary malignant neoplasm of bone: Secondary | ICD-10-CM | POA: Insufficient documentation

## 2019-05-16 DIAGNOSIS — C3411 Malignant neoplasm of upper lobe, right bronchus or lung: Secondary | ICD-10-CM | POA: Diagnosis not present

## 2019-05-17 ENCOUNTER — Ambulatory Visit: Payer: Medicare Other

## 2019-05-17 ENCOUNTER — Other Ambulatory Visit: Payer: Self-pay

## 2019-05-17 ENCOUNTER — Encounter: Payer: Self-pay | Admitting: Oncology

## 2019-05-17 ENCOUNTER — Ambulatory Visit
Admission: RE | Admit: 2019-05-17 | Discharge: 2019-05-17 | Disposition: A | Discharge status: Home / Self Care | Payer: Medicare Other | Source: Ambulatory Visit | Attending: Radiation Oncology | Admitting: Radiation Oncology

## 2019-05-17 DIAGNOSIS — Z510 Encounter for antineoplastic radiation therapy: Secondary | ICD-10-CM | POA: Diagnosis not present

## 2019-05-18 ENCOUNTER — Inpatient Hospital Stay: Payer: Medicare Other | Attending: Oncology

## 2019-05-18 ENCOUNTER — Inpatient Hospital Stay (HOSPITAL_BASED_OUTPATIENT_CLINIC_OR_DEPARTMENT_OTHER): Payer: Medicare Other | Admitting: Oncology

## 2019-05-18 ENCOUNTER — Inpatient Hospital Stay: Payer: Medicare Other

## 2019-05-18 ENCOUNTER — Encounter: Payer: Self-pay | Admitting: Oncology

## 2019-05-18 ENCOUNTER — Ambulatory Visit
Admission: RE | Admit: 2019-05-18 | Discharge: 2019-05-18 | Disposition: A | Discharge status: Home / Self Care | Payer: Medicare Other | Source: Ambulatory Visit | Attending: Radiation Oncology | Admitting: Radiation Oncology

## 2019-05-18 VITALS — BP 114/67 | HR 78 | Temp 96.3°F | Resp 18 | Wt 130.7 lb

## 2019-05-18 DIAGNOSIS — Z510 Encounter for antineoplastic radiation therapy: Secondary | ICD-10-CM | POA: Diagnosis not present

## 2019-05-18 DIAGNOSIS — Z79899 Other long term (current) drug therapy: Secondary | ICD-10-CM | POA: Insufficient documentation

## 2019-05-18 DIAGNOSIS — J449 Chronic obstructive pulmonary disease, unspecified: Secondary | ICD-10-CM | POA: Diagnosis not present

## 2019-05-18 DIAGNOSIS — C3411 Malignant neoplasm of upper lobe, right bronchus or lung: Secondary | ICD-10-CM | POA: Insufficient documentation

## 2019-05-18 DIAGNOSIS — C7951 Secondary malignant neoplasm of bone: Secondary | ICD-10-CM | POA: Diagnosis not present

## 2019-05-18 DIAGNOSIS — K7460 Unspecified cirrhosis of liver: Secondary | ICD-10-CM | POA: Diagnosis not present

## 2019-05-18 DIAGNOSIS — Z5111 Encounter for antineoplastic chemotherapy: Secondary | ICD-10-CM | POA: Diagnosis not present

## 2019-05-18 DIAGNOSIS — D701 Agranulocytosis secondary to cancer chemotherapy: Secondary | ICD-10-CM | POA: Insufficient documentation

## 2019-05-18 DIAGNOSIS — F17210 Nicotine dependence, cigarettes, uncomplicated: Secondary | ICD-10-CM | POA: Diagnosis not present

## 2019-05-18 DIAGNOSIS — C3491 Malignant neoplasm of unspecified part of right bronchus or lung: Secondary | ICD-10-CM

## 2019-05-18 DIAGNOSIS — T451X5A Adverse effect of antineoplastic and immunosuppressive drugs, initial encounter: Secondary | ICD-10-CM | POA: Insufficient documentation

## 2019-05-18 DIAGNOSIS — D61810 Antineoplastic chemotherapy induced pancytopenia: Secondary | ICD-10-CM | POA: Diagnosis not present

## 2019-05-18 DIAGNOSIS — Z5112 Encounter for antineoplastic immunotherapy: Secondary | ICD-10-CM | POA: Insufficient documentation

## 2019-05-18 LAB — CBC WITH DIFFERENTIAL/PLATELET
Abs Immature Granulocytes: 0.04 10*3/uL (ref 0.00–0.07)
Basophils Absolute: 0 10*3/uL (ref 0.0–0.1)
Basophils Relative: 1 %
Eosinophils Absolute: 0.1 10*3/uL (ref 0.0–0.5)
Eosinophils Relative: 3 %
HCT: 30.9 % — ABNORMAL LOW (ref 39.0–52.0)
Hemoglobin: 10.5 g/dL — ABNORMAL LOW (ref 13.0–17.0)
Immature Granulocytes: 2 %
Lymphocytes Relative: 29 %
Lymphs Abs: 0.6 10*3/uL — ABNORMAL LOW (ref 0.7–4.0)
MCH: 32.7 pg (ref 26.0–34.0)
MCHC: 34 g/dL (ref 30.0–36.0)
MCV: 96.3 fL (ref 80.0–100.0)
Monocytes Absolute: 0.3 10*3/uL (ref 0.1–1.0)
Monocytes Relative: 12 %
Neutro Abs: 1.1 10*3/uL — ABNORMAL LOW (ref 1.7–7.7)
Neutrophils Relative %: 53 %
Platelets: 79 10*3/uL — ABNORMAL LOW (ref 150–400)
RBC: 3.21 MIL/uL — ABNORMAL LOW (ref 4.22–5.81)
RDW: 14 % (ref 11.5–15.5)
WBC: 2 10*3/uL — ABNORMAL LOW (ref 4.0–10.5)
nRBC: 0 % (ref 0.0–0.2)

## 2019-05-18 LAB — COMPREHENSIVE METABOLIC PANEL
ALT: 63 U/L — ABNORMAL HIGH (ref 0–44)
AST: 71 U/L — ABNORMAL HIGH (ref 15–41)
Albumin: 2.6 g/dL — ABNORMAL LOW (ref 3.5–5.0)
Alkaline Phosphatase: 92 U/L (ref 38–126)
Anion gap: 6 (ref 5–15)
BUN: 23 mg/dL (ref 8–23)
CO2: 25 mmol/L (ref 22–32)
Calcium: 9 mg/dL (ref 8.9–10.3)
Chloride: 104 mmol/L (ref 98–111)
Creatinine, Ser: 1.1 mg/dL (ref 0.61–1.24)
GFR calc Af Amer: >60 mL/min (ref >60)
GFR calc non Af Amer: >60 mL/min (ref >60)
Glucose, Bld: 85 mg/dL (ref 70–99)
Potassium: 4.2 mmol/L (ref 3.5–5.1)
Sodium: 135 mmol/L (ref 135–145)
Total Bilirubin: 1.2 mg/dL (ref 0.3–1.2)
Total Protein: 7.8 g/dL (ref 6.5–8.1)

## 2019-05-18 LAB — TSH: TSH: 20.168 u[IU]/mL — ABNORMAL HIGH (ref 0.350–4.500)

## 2019-05-18 MED ORDER — SODIUM CHLORIDE 0.9 % IV SOLN
10.0000 mg | Freq: Once | INTRAVENOUS | Status: AC
  Administered 2019-05-18: 10 mg via INTRAVENOUS
  Filled 2019-05-18: qty 10

## 2019-05-18 MED ORDER — HEPARIN SOD (PORK) LOCK FLUSH 100 UNIT/ML IV SOLN
500.0000 [IU] | Freq: Once | INTRAVENOUS | Status: AC
  Administered 2019-05-18: 500 [IU] via INTRAVENOUS
  Filled 2019-05-18: qty 5

## 2019-05-18 MED ORDER — SODIUM CHLORIDE 0.9 % IV SOLN
Freq: Once | INTRAVENOUS | Status: AC
  Filled 2019-05-18: qty 250

## 2019-05-19 ENCOUNTER — Ambulatory Visit
Admission: RE | Admit: 2019-05-19 | Discharge: 2019-05-19 | Disposition: A | Discharge status: Home / Self Care | Payer: Medicare Other | Source: Ambulatory Visit | Attending: Radiation Oncology | Admitting: Radiation Oncology

## 2019-05-19 DIAGNOSIS — Z510 Encounter for antineoplastic radiation therapy: Secondary | ICD-10-CM | POA: Diagnosis not present

## 2019-05-19 LAB — T4: T4, Total: 8.6 ug/dL (ref 4.5–12.0)

## 2019-05-22 ENCOUNTER — Ambulatory Visit
Admission: RE | Admit: 2019-05-22 | Discharge: 2019-05-22 | Disposition: A | Discharge status: Home / Self Care | Payer: Medicare Other | Source: Ambulatory Visit | Attending: Radiation Oncology | Admitting: Radiation Oncology

## 2019-05-22 DIAGNOSIS — Z510 Encounter for antineoplastic radiation therapy: Secondary | ICD-10-CM | POA: Diagnosis not present

## 2019-05-25 NOTE — Progress Notes (Signed)

## 2019-05-26 NOTE — Progress Notes (Signed)
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    INTERVAL HISTORY: Patient returns to clinic today for further evaluation and consideration of cycle 2 of carboplatinum, pemetrexed, and pembrolizumab.  He has a "lump" in his left groin that is nontender, but otherwise feels well.  He does not complain of weakness or fatigue today. He continues to smoke heavily.  He has no neurologic complaints.  He denies any recent fevers or illnesses.  He has a fair appetite, but denies weight loss.  He denies any pain.  He has no chest pain, shortness of breath, cough, or hemoptysis.  He denies any nausea, vomiting, constipation, or diarrhea.  He has no urinary complaints.  Patient offers no further specific complaints today.  REVIEW OF SYSTEMS:    Review of Systems  Constitutional: Negative.  Negative for fever, malaise/fatigue and weight loss.  HENT: Negative.  Negative for sore throat.   Respiratory: Negative.  Negative for cough and shortness of breath.   Cardiovascular: Negative.  Negative for chest pain and leg swelling.  Gastrointestinal: Negative.  Negative for abdominal pain.  Genitourinary: Negative.  Negative for dysuria.  Musculoskeletal: Negative.  Negative for back pain.  Skin: Negative.  Negative for rash.  Neurological: Negative.  Negative for dizziness, sensory change, focal weakness, weakness and headaches.  Psychiatric/Behavioral: Negative.  Negative for substance abuse. The patient does not have insomnia.     As per HPI. Otherwise, a complete review of systems is negative.  PAST MEDICAL HISTORY: Past Medical  History:  Diagnosis Date  . Alcohol abuse   . Cirrhosis ([place])   . COPD (chronic obstructive pulmonary disease) ([place])   . Dementia ([place])   . Hepatitis C   . Malnutrition ([place])   . Throat cancer ([place])     PAST SURGICAL HISTORY: Past Surgical History:  Procedure Laterality Date  . IR IMAGING GUIDED PORT INSERTION  04/28/2019  . TONSILLECTOMY     age was 20  . VASECTOMY      FAMILY HISTORY: Family History  Problem Relation Age of Onset  . Breast cancer Mother     ADVANCED DIRECTIVES (Y/N):  N  HEALTH MAINTENANCE: Social History   Tobacco Use  . Smoking status: Current Every Day Smoker    Packs/day: 0.50    Years: 63.00    Pack years: 31.50    Types: Cigarettes  . Smokeless tobacco: Never Used  Substance Use Topics  . Alcohol use: Not Currently    Alcohol/week: 1.0 standard drinks    Types: 1 Cans of beer per week  . Drug use: No     Colonoscopy:  PAP:  Bone density:  Lipid panel:  Allergies  Allergen Reactions  . Penicillins Other (See Comments)    Reaction: unknown Patient is not able to answer follow up questions    Current Outpatient Medications  Medication Sig Dispense Refill  . alum & mag hydroxide-simeth (MAALOX/MYLANTA) 200-200-20 MG/5ML suspension Take 15 mLs by mouth as needed for indigestion or heartburn. 355 mL 0  . buPROPion (WELLBUTRIN SR) 150 MG 12 hr tablet bupropion HCl SR 150 mg tablet,12 hr sustained-release    . Cholecalciferol (VITAMIN D3) 5000 units TABS  Take 5,000 Units by mouth daily.    Marland Kitchen docusate (COLACE) 50 MG/5ML liquid Take 100 mg by mouth daily.     . ferrous sulfate 325 (65 FE) MG tablet Take 325 mg by mouth daily with breakfast.    . folic acid (FOLVITE) 1 MG tablet Take 1 mg by mouth daily.    . folic acid (FOLVITE) 1 MG tablet Take 1 tablet (1 mg total) by mouth daily. Start 5-7 days before Alimta chemotherapy. Continue until 21 days after Alimta completed. 100 tablet 3  . hydrOXYzine (ATARAX/VISTARIL) 25 MG tablet Take 100  mg by mouth at bedtime.     . lactose free nutrition (BOOST PLUS) LIQD Take 237 mLs by mouth 4 (four) times daily.    Marland Kitchen levothyroxine (SYNTHROID) 100 MCG tablet Take 100 mcg by mouth daily before breakfast.    . lidocaine-prilocaine (EMLA) cream Apply to affected area once 30 g 3  . losartan (COZAAR) 25 MG tablet Take 25 mg by mouth daily.    . mirtazapine (REMERON) 15 MG tablet Take 15 mg by mouth at bedtime.    . mupirocin ointment (BACTROBAN) 2 % Apply 1 application topically 2 (two) times daily.    . ondansetron (ZOFRAN) 8 MG tablet Take by mouth every 8 (eight) hours as needed for nausea or vomiting.    . ondansetron (ZOFRAN) 8 MG tablet Take 1 tablet (8 mg total) by mouth 2 (two) times daily as needed (Nausea or vomiting). Start if needed on the third day after chemotherapy. 60 tablet 2  . polyethylene glycol (MIRALAX / GLYCOLAX) 17 g packet Take 17 g by mouth daily.    . prochlorperazine (COMPAZINE) 10 MG tablet Take 1 tablet (10 mg total) by mouth every 6 (six) hours as needed (Nausea or vomiting). 60 tablet 2  . QUEtiapine (SEROQUEL) 25 MG tablet Take 25 mg by mouth 3 (three) times daily.    . QUEtiapine (SEROQUEL) 50 MG tablet Take 1 tablet (50 mg total) by mouth at bedtime. (Patient taking differently: Take 25 mg by mouth 3 (three) times daily. ) 30 tablet 0  . tamsulosin (FLOMAX) 0.4 MG CAPS capsule Take 0.4 mg by mouth 2 (two) times daily.     Marland Kitchen thiamine 100 MG tablet Take 100 mg by mouth daily.    . TRELEGY ELLIPTA 100-62.5-25 MCG/INH AEPB Inhale 1 puff into the lungs in the morning.     . vitamin C (ASCORBIC ACID) 250 MG tablet Take 500 mg by mouth daily.     . Vitamin D, Ergocalciferol, (DRISDOL) 1.25 MG (50000 UNIT) CAPS capsule Take 50,000 Units by mouth every 7 (seven) days.     No current facility-administered medications for this visit.   Facility-Administered Medications Ordered in Other Visits  Medication Dose Route Frequency Provider Last Rate Last Admin  . CARBOplatin  (PARAPLATIN) 370 mg in sodium chloride 0.9 % 100 mL chemo infusion  370 mg Intravenous Once Lloyd Huger, MD      . cyanocobalamin ((VITAMIN B-12)) injection 1,000 mcg  1,000 mcg Intramuscular Once Lloyd Huger, MD      . dexamethasone (DECADRON) 10 mg in sodium chloride 0.9 % 50 mL IVPB  10 mg Intravenous Once Lloyd Huger, MD      . fosaprepitant (EMEND) 150 mg in sodium chloride 0.9 % 145 mL IVPB  150 mg Intravenous Once Lloyd Huger, MD      . heparin lock flush 100 unit/mL  500 Units Intravenous Once Delight Hoh  J, MD      . heparin lock flush 100 unit/mL  500 Units Intracatheter Once PRN Lloyd Huger, MD      . palonosetron (ALOXI) injection 0.25 mg  0.25 mg Intravenous Once Lloyd Huger, MD      . pembrolizumab [facility]) 200 mg in sodium chloride 0.9 % 50 mL chemo infusion  200 mg Intravenous Once Lloyd Huger, MD      . PEMEtrexed (ALIMTA) 850 mg in sodium chloride 0.9 % 100 mL chemo infusion  500 mg/m2 (Treatment Plan Recorded) Intravenous Once Lloyd Huger, MD        OBJECTIVE: Vitals:   06/01/19 0906  BP: (!) 105/92  Pulse: 88  Resp: 18  Temp: (!) 97.1 F (36.2 C)  SpO2: 100%     Body mass index is 20.4 kg/m.    ECOG FS:0 - Asymptomatic  General: Thin, no acute distress. Eyes: Pink conjunctiva, anicteric sclera. HEENT: Normocephalic, moist mucous membranes. Lungs: No audible wheezing or coughing. Heart: Regular rate and rhythm. Abdomen: Soft, nontender, no obvious distention. Musculoskeletal: No edema, cyanosis, or clubbing.  No palpable lymphadenopathy or hernia. Neuro: Alert, answering all questions appropriately. Cranial nerves grossly intact. Skin: No rashes or petechiae noted. Psych: Normal affect.  LAB RESULTS:  Lab Results  Component Value Date   NA 133 (L) 06/01/2019   K 3.9 06/01/2019   CL 103 06/01/2019   CO2 21 (L) 06/01/2019   GLUCOSE 169 (H) 06/01/2019   BUN 14 06/01/2019   CREATININE  1.11 06/01/2019   CALCIUM 8.4 (L) 06/01/2019   PROT 6.7 06/01/2019   ALBUMIN 2.5 (L) 06/01/2019   AST 58 (H) 06/01/2019   ALT 45 (H) 06/01/2019   ALKPHOS 89 06/01/2019   BILITOT 0.8 06/01/2019   GFRNONAA >60 06/01/2019   GFRAA >60 06/01/2019    Lab Results  Component Value Date   WBC 2.7 (L) 06/01/2019   NEUTROABS 1.1 (L) 06/01/2019   HGB 8.8 (L) 06/01/2019   HCT 25.7 (L) 06/01/2019   MCV 96.6 06/01/2019   PLT 232 06/01/2019     STUDIES: No results found.  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    PLAN:    1.  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  Patient has a new left acetabular metastasis confirming stage IV disease.  MRI of the brain on May 01, 2019 did not reveal metastatic disease.  Ominseq testing for PD-L1 is pending at time of dictation.  Patient has had port placement.  Continue daily XRT for palliative treatment to his acetabulum.  Plan to give carboplatinum, pemetrexed, and Keytruda every 3 weeks for 4 cycles, patient will then maintenance pemetrexed and Keytruda.  Proceed with cycle 2 of treatment today.  Given his relative neutropenia, patient will return tomorrow for [facility].  He will then return to clinic in 3 weeks for further evaluation and consideration of cycle 3.  2. 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  Patient received his last dose of weekly cisplatin on May 04, 2016.  3.  Social situation: Patient has a legal guardian. 4.  Fatigue: Resolved. 5.  Neutropenia: Proceed with treatment today and will add Udenyca for the remainder of the cycles. 6.  Anemia: Patient's hemoglobin  has trended down to 8.8, monitor. 7.  Elevated liver enzymes: Chronic and unchanged,  monitor.  Patient expressed understanding and was in agreement with this plan. He also understands that He can call clinic at any time with any questions, concerns, or complaints.    Cancer Staging Adenocarcinoma, lung, right [facility]) Staging form: Lung, AJCC 8th Edition - Clinical stage from 05/02/2019: Stage IVB (cT1c, cN2, cM1c) - Signed by Lloyd Huger, MD on 05/02/2019  Primary cancer of larynx [facility]) Staging form: Larynx - Supraglottis, AJCC 8th Edition - Clinical stage from 02/27/2016: Stage IVA (cT1, cN2b, cM0) - Signed by Lloyd Huger, MD on 02/27/2016   Lloyd Huger, MD 06/01/19 9:41 AM

## 2019-05-29 ENCOUNTER — Other Ambulatory Visit: Payer: Self-pay | Admitting: *Deleted

## 2019-05-29 DIAGNOSIS — C3491 Malignant neoplasm of unspecified part of right bronchus or lung: Secondary | ICD-10-CM

## 2019-05-29 DIAGNOSIS — R109 Unspecified abdominal pain: Secondary | ICD-10-CM

## 2019-05-30 ENCOUNTER — Other Ambulatory Visit: Payer: Self-pay

## 2019-05-30 ENCOUNTER — Inpatient Hospital Stay: Payer: Medicare Other | Admitting: Oncology

## 2019-05-30 ENCOUNTER — Inpatient Hospital Stay: Payer: Medicare Other

## 2019-05-30 DIAGNOSIS — C3491 Malignant neoplasm of unspecified part of right bronchus or lung: Secondary | ICD-10-CM

## 2019-05-30 DIAGNOSIS — Z5112 Encounter for antineoplastic immunotherapy: Secondary | ICD-10-CM | POA: Diagnosis not present

## 2019-05-30 DIAGNOSIS — C3490 Malignant neoplasm of unspecified part of unspecified bronchus or lung: Secondary | ICD-10-CM

## 2019-05-30 DIAGNOSIS — R109 Unspecified abdominal pain: Secondary | ICD-10-CM

## 2019-05-30 LAB — CBC WITH DIFFERENTIAL/PLATELET
Abs Immature Granulocytes: 0.06 10*3/uL (ref 0.00–0.07)
Basophils Absolute: 0 10*3/uL (ref 0.0–0.1)
Basophils Relative: 1 %
Eosinophils Absolute: 0 10*3/uL (ref 0.0–0.5)
Eosinophils Relative: 2 %
HCT: 26.1 % — ABNORMAL LOW (ref 39.0–52.0)
Hemoglobin: 8.8 g/dL — ABNORMAL LOW (ref 13.0–17.0)
Immature Granulocytes: 2 %
Lymphocytes Relative: 30 %
Lymphs Abs: 0.8 10*3/uL (ref 0.7–4.0)
MCH: 32.2 pg (ref 26.0–34.0)
MCHC: 33.7 g/dL (ref 30.0–36.0)
MCV: 95.6 fL (ref 80.0–100.0)
Monocytes Absolute: 0.7 10*3/uL (ref 0.1–1.0)
Monocytes Relative: 28 %
Neutro Abs: 1 10*3/uL — ABNORMAL LOW (ref 1.7–7.7)
Neutrophils Relative %: 37 %
Platelets: 179 10*3/uL (ref 150–400)
RBC: 2.73 MIL/uL — ABNORMAL LOW (ref 4.22–5.81)
RDW: 14.9 % (ref 11.5–15.5)
WBC: 2.6 10*3/uL — ABNORMAL LOW (ref 4.0–10.5)
nRBC: 0 % (ref 0.0–0.2)

## 2019-05-30 LAB — COMPREHENSIVE METABOLIC PANEL
ALT: 47 U/L — ABNORMAL HIGH (ref 0–44)
AST: 54 U/L — ABNORMAL HIGH (ref 15–41)
Albumin: 2.6 g/dL — ABNORMAL LOW (ref 3.5–5.0)
Alkaline Phosphatase: 89 U/L (ref 38–126)
Anion gap: 7 (ref 5–15)
BUN: 15 mg/dL (ref 8–23)
CO2: 24 mmol/L (ref 22–32)
Calcium: 8.5 mg/dL — ABNORMAL LOW (ref 8.9–10.3)
Chloride: 101 mmol/L (ref 98–111)
Creatinine, Ser: 0.98 mg/dL (ref 0.61–1.24)
GFR calc Af Amer: >60 mL/min (ref >60)
GFR calc non Af Amer: >60 mL/min (ref >60)
Glucose, Bld: 100 mg/dL — ABNORMAL HIGH (ref 70–99)
Potassium: 4.4 mmol/L (ref 3.5–5.1)
Sodium: 132 mmol/L — ABNORMAL LOW (ref 135–145)
Total Bilirubin: 0.8 mg/dL (ref 0.3–1.2)
Total Protein: 7.1 g/dL (ref 6.5–8.1)

## 2019-05-30 NOTE — Progress Notes (Signed)
This encounter was created in error - please disregard.

## 2019-06-01 ENCOUNTER — Other Ambulatory Visit: Payer: Self-pay

## 2019-06-01 ENCOUNTER — Encounter: Payer: Self-pay | Admitting: Oncology

## 2019-06-01 ENCOUNTER — Inpatient Hospital Stay: Payer: Medicare Other

## 2019-06-01 ENCOUNTER — Inpatient Hospital Stay (HOSPITAL_BASED_OUTPATIENT_CLINIC_OR_DEPARTMENT_OTHER): Payer: Medicare Other | Admitting: Oncology

## 2019-06-01 VITALS — BP 105/92 | HR 88 | Temp 97.1°F | Resp 18 | Wt 132.2 lb

## 2019-06-01 DIAGNOSIS — C3491 Malignant neoplasm of unspecified part of right bronchus or lung: Secondary | ICD-10-CM

## 2019-06-01 DIAGNOSIS — Z5112 Encounter for antineoplastic immunotherapy: Secondary | ICD-10-CM | POA: Diagnosis not present

## 2019-06-01 LAB — CBC WITH DIFFERENTIAL/PLATELET
Abs Immature Granulocytes: 0.07 10*3/uL (ref 0.00–0.07)
Basophils Absolute: 0 10*3/uL (ref 0.0–0.1)
Basophils Relative: 1 %
Eosinophils Absolute: 0.1 10*3/uL (ref 0.0–0.5)
Eosinophils Relative: 3 %
HCT: 25.7 % — ABNORMAL LOW (ref 39.0–52.0)
Hemoglobin: 8.8 g/dL — ABNORMAL LOW (ref 13.0–17.0)
Immature Granulocytes: 3 %
Lymphocytes Relative: 32 %
Lymphs Abs: 0.9 10*3/uL (ref 0.7–4.0)
MCH: 33.1 pg (ref 26.0–34.0)
MCHC: 34.2 g/dL (ref 30.0–36.0)
MCV: 96.6 fL (ref 80.0–100.0)
Monocytes Absolute: 0.6 10*3/uL (ref 0.1–1.0)
Monocytes Relative: 23 %
Neutro Abs: 1.1 10*3/uL — ABNORMAL LOW (ref 1.7–7.7)
Neutrophils Relative %: 38 %
Platelets: 232 10*3/uL (ref 150–400)
RBC: 2.66 MIL/uL — ABNORMAL LOW (ref 4.22–5.81)
RDW: 15.5 % (ref 11.5–15.5)
WBC: 2.7 10*3/uL — ABNORMAL LOW (ref 4.0–10.5)
nRBC: 0 % (ref 0.0–0.2)

## 2019-06-01 LAB — COMPREHENSIVE METABOLIC PANEL
ALT: 45 U/L — ABNORMAL HIGH (ref 0–44)
AST: 58 U/L — ABNORMAL HIGH (ref 15–41)
Albumin: 2.5 g/dL — ABNORMAL LOW (ref 3.5–5.0)
Alkaline Phosphatase: 89 U/L (ref 38–126)
Anion gap: 9 (ref 5–15)
BUN: 14 mg/dL (ref 8–23)
CO2: 21 mmol/L — ABNORMAL LOW (ref 22–32)
Calcium: 8.4 mg/dL — ABNORMAL LOW (ref 8.9–10.3)
Chloride: 103 mmol/L (ref 98–111)
Creatinine, Ser: 1.11 mg/dL (ref 0.61–1.24)
GFR calc Af Amer: >60 mL/min (ref >60)
GFR calc non Af Amer: >60 mL/min (ref >60)
Glucose, Bld: 169 mg/dL — ABNORMAL HIGH (ref 70–99)
Potassium: 3.9 mmol/L (ref 3.5–5.1)
Sodium: 133 mmol/L — ABNORMAL LOW (ref 135–145)
Total Bilirubin: 0.8 mg/dL (ref 0.3–1.2)
Total Protein: 6.7 g/dL (ref 6.5–8.1)

## 2019-06-01 LAB — TSH: TSH: 9.57 u[IU]/mL — ABNORMAL HIGH (ref 0.350–4.500)

## 2019-06-01 MED ORDER — SODIUM CHLORIDE 0.9 % IV SOLN
Freq: Once | INTRAVENOUS | Status: AC
  Filled 2019-06-01: qty 250

## 2019-06-01 MED ORDER — SODIUM CHLORIDE 0.9% FLUSH
10.0000 mL | Freq: Once | INTRAVENOUS | Status: AC
  Administered 2019-06-01: 10 mL via INTRAVENOUS
  Filled 2019-06-01: qty 10

## 2019-06-01 MED ORDER — SODIUM CHLORIDE 0.9 % IV SOLN
500.0000 mg/m2 | Freq: Once | INTRAVENOUS | Status: AC
  Administered 2019-06-01: 850 mg via INTRAVENOUS
  Filled 2019-06-01: qty 20

## 2019-06-01 MED ORDER — SODIUM CHLORIDE 0.9 % IV SOLN
200.0000 mg | Freq: Once | INTRAVENOUS | Status: AC
  Administered 2019-06-01: 200 mg via INTRAVENOUS
  Filled 2019-06-01: qty 8

## 2019-06-01 MED ORDER — SODIUM CHLORIDE 0.9 % IV SOLN
380.0000 mg | Freq: Once | INTRAVENOUS | Status: AC
  Administered 2019-06-01: 380 mg via INTRAVENOUS
  Filled 2019-06-01: qty 38

## 2019-06-01 MED ORDER — CYANOCOBALAMIN 1000 MCG/ML IJ SOLN
1000.0000 ug | Freq: Once | INTRAMUSCULAR | Status: AC
  Administered 2019-06-01: 1000 ug via INTRAMUSCULAR
  Filled 2019-06-01: qty 1

## 2019-06-01 MED ORDER — SODIUM CHLORIDE 0.9 % IV SOLN
10.0000 mg | Freq: Once | INTRAVENOUS | Status: AC
  Administered 2019-06-01: 10 mg via INTRAVENOUS
  Filled 2019-06-01: qty 10

## 2019-06-01 MED ORDER — PALONOSETRON HCL INJECTION 0.25 MG/5ML
0.2500 mg | Freq: Once | INTRAVENOUS | Status: AC
  Administered 2019-06-01: 0.25 mg via INTRAVENOUS
  Filled 2019-06-01: qty 5

## 2019-06-01 MED ORDER — HEPARIN SOD (PORK) LOCK FLUSH 100 UNIT/ML IV SOLN
INTRAVENOUS | Status: AC
  Filled 2019-06-01: qty 5

## 2019-06-01 MED ORDER — SODIUM CHLORIDE 0.9 % IV SOLN
150.0000 mg | Freq: Once | INTRAVENOUS | Status: AC
  Administered 2019-06-01: 150 mg via INTRAVENOUS
  Filled 2019-06-01: qty 150

## 2019-06-01 MED ORDER — HEPARIN SOD (PORK) LOCK FLUSH 100 UNIT/ML IV SOLN
500.0000 [IU] | Freq: Once | INTRAVENOUS | Status: AC | PRN
  Administered 2019-06-01: 500 [IU]
  Filled 2019-06-01: qty 5

## 2019-06-01 NOTE — Progress Notes (Signed)
Patient here for 3 week follow up. Denies any pain. States he feels unsafe in current living environment. Patient has legal guardian. Patient was made aware he could leave but needed to have guardian to leave.

## 2019-06-02 ENCOUNTER — Inpatient Hospital Stay: Payer: Medicare Other

## 2019-06-02 DIAGNOSIS — Z5112 Encounter for antineoplastic immunotherapy: Secondary | ICD-10-CM | POA: Diagnosis not present

## 2019-06-02 DIAGNOSIS — C3491 Malignant neoplasm of unspecified part of right bronchus or lung: Secondary | ICD-10-CM

## 2019-06-02 LAB — T4: T4, Total: 8.4 ug/dL (ref 4.5–12.0)

## 2019-06-02 MED ORDER — PEGFILGRASTIM-CBQV 6 MG/0.6ML ~~LOC~~ SOSY
6.0000 mg | PREFILLED_SYRINGE | Freq: Once | SUBCUTANEOUS | Status: AC
  Administered 2019-06-02: 6 mg via SUBCUTANEOUS
  Filled 2019-06-02: qty 0.6

## 2019-06-15 NOTE — Progress Notes (Signed)

## 2019-06-17 NOTE — Progress Notes (Signed)
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    INTERVAL HISTORY: Patient returns to clinic today for further evaluation and consideration of cycle 3 of carboplatinum, pemetrexed, and pembrolizumab.  [name] currently feels well and is asymptomatic.  [name] does not complain of any weakness or fatigue. [name] continues to smoke heavily.  [name] has no neurologic complaints.  [name] denies any recent fevers or illnesses.  [name] has a fair appetite, but denies weight loss.  [name] denies any pain.  [name] has no chest pain, shortness of breath, cough, or hemoptysis.  [name] denies any nausea, vomiting, constipation, or diarrhea.  [name] has no urinary complaints.  Patient offers no specific complaints today.  REVIEW OF SYSTEMS:    Review of Systems  Constitutional: Negative.  Negative for fever, malaise/fatigue and weight loss.  HENT: Negative.  Negative for sore throat.   Respiratory: Negative.  Negative for cough and shortness of breath.   Cardiovascular: Negative.  Negative for chest pain and leg swelling.  Gastrointestinal: Negative.  Negative for abdominal pain.  Genitourinary: Negative.  Negative for dysuria.  Musculoskeletal: Negative.  Negative for back pain.  Skin: Negative.  Negative for rash.  Neurological: Negative.  Negative for dizziness, sensory change, focal weakness, weakness and headaches.  Psychiatric/Behavioral: Negative.  Negative for substance abuse. The patient does not have insomnia.     As per HPI. Otherwise, a complete review of systems is negative.  PAST MEDICAL HISTORY: Past Medical History:  Diagnosis Date  . Alcohol abuse   .  Cirrhosis ([place])   . COPD (chronic obstructive pulmonary disease) ([place])   . Dementia ([place])   . Hepatitis C   . Malnutrition ([place])   . Throat cancer ([place])     PAST SURGICAL HISTORY: Past Surgical History:  Procedure Laterality Date  . IR IMAGING GUIDED PORT INSERTION  04/28/2019  . TONSILLECTOMY     age was 52  . VASECTOMY      FAMILY HISTORY: Family History  Problem Relation Age of Onset  . Breast cancer Mother     ADVANCED DIRECTIVES (Y/N):  N  HEALTH MAINTENANCE: Social History   Tobacco Use  . Smoking status: Current Every Day Smoker    Packs/day: 0.50    Years: 63.00    Pack years: 31.50    Types: Cigarettes  . Smokeless tobacco: Never Used  Substance Use Topics  . Alcohol use: Not Currently    Alcohol/week: 1.0 standard drinks    Types: 1 Cans of beer per week  . Drug use: No     Colonoscopy:  PAP:  Bone density:  Lipid panel:  Allergies  Allergen Reactions  . Penicillins Other (See Comments)    Reaction: unknown Patient is not able to answer follow up questions    Current Outpatient Medications  Medication Sig Dispense Refill  . alum & mag hydroxide-simeth (MAALOX/MYLANTA) 200-200-20 MG/5ML suspension Take 15 mLs by mouth as needed for indigestion or heartburn. 355 mL 0  . buPROPion (WELLBUTRIN SR) 150 MG 12 hr tablet bupropion HCl SR 150 mg tablet,12 hr sustained-release    . Cholecalciferol (VITAMIN D3) 5000 units TABS Take 5,000 Units by mouth daily.    Marland Kitchen  docusate (COLACE) 50 MG/5ML liquid Take 100 mg by mouth daily.     . ferrous sulfate 325 (65 FE) MG tablet Take 325 mg by mouth daily with breakfast.    . folic acid (FOLVITE) 1 MG tablet Take 1 mg by mouth daily.    . folic acid (FOLVITE) 1 MG tablet Take 1 tablet (1 mg total) by mouth daily. Start 5-7 days before Alimta chemotherapy. Continue until 21 days after Alimta completed. 100 tablet 3  . hydrOXYzine (ATARAX/VISTARIL) 25 MG tablet Take 100 mg by mouth at bedtime.     . lactose free  nutrition (BOOST PLUS) LIQD Take 237 mLs by mouth 4 (four) times daily.    Marland Kitchen levothyroxine (SYNTHROID) 100 MCG tablet Take 100 mcg by mouth daily before breakfast.    . lidocaine-prilocaine (EMLA) cream Apply to affected area once 30 g 3  . losartan (COZAAR) 25 MG tablet Take 25 mg by mouth daily.    . mirtazapine (REMERON) 15 MG tablet Take 15 mg by mouth at bedtime.    . mupirocin ointment (BACTROBAN) 2 % Apply 1 application topically 2 (two) times daily.    . ondansetron (ZOFRAN) 8 MG tablet Take by mouth every 8 (eight) hours as needed for nausea or vomiting.    . ondansetron (ZOFRAN) 8 MG tablet Take 1 tablet (8 mg total) by mouth 2 (two) times daily as needed (Nausea or vomiting). Start if needed on the third day after chemotherapy. 60 tablet 2  . polyethylene glycol (MIRALAX / GLYCOLAX) 17 g packet Take 17 g by mouth daily.    . prochlorperazine (COMPAZINE) 10 MG tablet Take 1 tablet (10 mg total) by mouth every 6 (six) hours as needed (Nausea or vomiting). 60 tablet 2  . QUEtiapine (SEROQUEL) 25 MG tablet Take 25 mg by mouth 3 (three) times daily.    . QUEtiapine (SEROQUEL) 50 MG tablet Take 1 tablet (50 mg total) by mouth at bedtime. (Patient taking differently: Take 25 mg by mouth 3 (three) times daily. ) 30 tablet 0  . tamsulosin (FLOMAX) 0.4 MG CAPS capsule Take 0.4 mg by mouth 2 (two) times daily.     Marland Kitchen thiamine 100 MG tablet Take 100 mg by mouth daily.    . TRELEGY ELLIPTA 100-62.5-25 MCG/INH AEPB Inhale 1 puff into the lungs in the morning.     . vitamin C (ASCORBIC ACID) 250 MG tablet Take 500 mg by mouth daily.     . Vitamin D, Ergocalciferol, (DRISDOL) 1.25 MG (50000 UNIT) CAPS capsule Take 50,000 Units by mouth every 7 (seven) days.     No current facility-administered medications for this visit.   Facility-Administered Medications Ordered in Other Visits  Medication Dose Route Frequency Provider Last Rate Last Admin  . CARBOplatin (PARAPLATIN) 380 mg in sodium chloride 0.9 %  250 mL chemo infusion  380 mg Intravenous Once Lloyd Huger, MD      . heparin lock flush 100 unit/mL  500 Units Intravenous Once Lloyd Huger, MD      . heparin lock flush 100 unit/mL  500 Units Intracatheter Once PRN Lloyd Huger, MD      . PEMEtrexed (ALIMTA) 850 mg in sodium chloride 0.9 % 100 mL chemo infusion  500 mg/m2 (Treatment Plan Recorded) Intravenous Once Lloyd Huger, MD      . sodium chloride flush (NS) 0.9 % injection 10 mL  10 mL Intravenous PRN Lloyd Huger, MD   10 mL at 06/22/19 [phone]  OBJECTIVE: Vitals:   06/22/19 0913  BP: (!) 123/57  Pulse: 86  Resp: 18  Temp: (!) 96.7 F (35.9 C)  SpO2: 100%     Body mass index is 20.91 kg/m.    ECOG FS:0 - Asymptomatic  General: Thin, no acute distress. Eyes: Pink conjunctiva, anicteric sclera. HEENT: Normocephalic, moist mucous membranes. Lungs: No audible wheezing or coughing. Heart: Regular rate and rhythm. Abdomen: Soft, nontender, no obvious distention. Musculoskeletal: No edema, cyanosis, or clubbing. Neuro: Alert, answering all questions appropriately. Cranial nerves grossly intact. Skin: No rashes or petechiae noted. Psych: Normal affect.  LAB RESULTS:  Lab Results  Component Value Date   NA 137 06/22/2019   K 4.1 06/22/2019   CL 108 06/22/2019   CO2 22 06/22/2019   GLUCOSE 159 (H) 06/22/2019   BUN 13 06/22/2019   CREATININE 0.94 06/22/2019   CALCIUM 8.2 (L) 06/22/2019   PROT 6.1 (L) 06/22/2019   ALBUMIN 2.3 (L) 06/22/2019   AST 54 (H) 06/22/2019   ALT 37 06/22/2019   ALKPHOS 99 06/22/2019   BILITOT 0.9 06/22/2019   GFRNONAA >60 06/22/2019   GFRAA >60 06/22/2019    Lab Results  Component Value Date   WBC 7.4 06/22/2019   NEUTROABS 4.8 06/22/2019   HGB 7.3 (L) 06/22/2019   HCT 21.9 (L) 06/22/2019   MCV 100.0 06/22/2019   PLT 212 06/22/2019     STUDIES: No results found.  ASSESSMENT: Stage IVb adenocarcinoma of the right upper lobe lung with bony  metastasis.    PLAN:    1.  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  Patient has a new left acetabular metastasis confirming stage IV disease.  MRI of the brain on May 01, 2019 did not reveal metastatic disease.  Ominseq testing for PD-L1 is pending at time of dictation.  Patient has had port placement.  Patient has completed palliative XRT to his acetabulum.  Plan to give carboplatinum, pemetrexed, and Keytruda every 3 weeks for 4 cycles, patient will then maintenance pemetrexed and Keytruda.  Proceed with cycle 3 of treatment today.  Return to clinic tomorrow for [place], in 1 week for laboratory work and consideration of blood transfusion, and then in 3 weeks for further evaluation and consideration of cycle 4.  2. 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  Patient received his last dose of weekly cisplatin on May 04, 2016.  3.  Social situation: Patient has a legal guardian. 4.  Fatigue: Resolved. 5.  Neutropenia: Proceed with treatment today and will add Udenyca for the remainder of the cycles. 6.  Anemia: Patient's hemoglobin continues to trend down and is now 7.3.  Proceed with treatment as above and return to clinic in 1 week for consideration of blood transfusion. 7.  Elevated liver enzymes: Improving, monitor.    Patient expressed understanding and was in agreement with this plan. [name] also understands that [name] can call clinic at any time with any questions, concerns, or complaints.    Cancer Staging Adenocarcinoma, lung, right [facility]) Staging form: Lung, AJCC 8th Edition - Clinical stage from 05/02/2019: Stage IVB (cT1c, cN2, cM1c) - Signed by Lloyd Huger, MD on 05/02/2019  Primary cancer of larynx [place]) Staging form: Larynx - Supraglottis,  AJCC 8th Edition - Clinical stage from 02/27/2016: Stage  IVA (cT1, cN2b, cM0) - Signed by Lloyd Huger, MD on 02/27/2016   Lloyd Huger, MD 06/22/19 11:54 AM

## 2019-06-22 ENCOUNTER — Inpatient Hospital Stay: Payer: Medicare Other

## 2019-06-22 ENCOUNTER — Ambulatory Visit: Payer: Medicare Other | Admitting: Radiation Oncology

## 2019-06-22 ENCOUNTER — Encounter: Payer: Self-pay | Admitting: Oncology

## 2019-06-22 ENCOUNTER — Inpatient Hospital Stay (HOSPITAL_BASED_OUTPATIENT_CLINIC_OR_DEPARTMENT_OTHER): Payer: Medicare Other | Admitting: Oncology

## 2019-06-22 ENCOUNTER — Inpatient Hospital Stay: Payer: Medicare Other | Attending: Oncology

## 2019-06-22 ENCOUNTER — Other Ambulatory Visit: Payer: Self-pay

## 2019-06-22 VITALS — BP 123/57 | HR 86 | Temp 96.7°F | Resp 18 | Wt 135.5 lb

## 2019-06-22 DIAGNOSIS — C3411 Malignant neoplasm of upper lobe, right bronchus or lung: Secondary | ICD-10-CM | POA: Diagnosis not present

## 2019-06-22 DIAGNOSIS — Z5111 Encounter for antineoplastic chemotherapy: Secondary | ICD-10-CM | POA: Diagnosis not present

## 2019-06-22 DIAGNOSIS — Z5112 Encounter for antineoplastic immunotherapy: Secondary | ICD-10-CM | POA: Insufficient documentation

## 2019-06-22 DIAGNOSIS — D649 Anemia, unspecified: Secondary | ICD-10-CM | POA: Diagnosis not present

## 2019-06-22 DIAGNOSIS — R748 Abnormal levels of other serum enzymes: Secondary | ICD-10-CM | POA: Diagnosis not present

## 2019-06-22 DIAGNOSIS — C3491 Malignant neoplasm of unspecified part of right bronchus or lung: Secondary | ICD-10-CM

## 2019-06-22 DIAGNOSIS — Z95828 Presence of other vascular implants and grafts: Secondary | ICD-10-CM

## 2019-06-22 DIAGNOSIS — C7951 Secondary malignant neoplasm of bone: Secondary | ICD-10-CM | POA: Insufficient documentation

## 2019-06-22 DIAGNOSIS — K7460 Unspecified cirrhosis of liver: Secondary | ICD-10-CM | POA: Diagnosis not present

## 2019-06-22 DIAGNOSIS — J449 Chronic obstructive pulmonary disease, unspecified: Secondary | ICD-10-CM | POA: Insufficient documentation

## 2019-06-22 DIAGNOSIS — F17210 Nicotine dependence, cigarettes, uncomplicated: Secondary | ICD-10-CM | POA: Diagnosis not present

## 2019-06-22 DIAGNOSIS — C329 Malignant neoplasm of larynx, unspecified: Secondary | ICD-10-CM | POA: Diagnosis not present

## 2019-06-22 DIAGNOSIS — Z79899 Other long term (current) drug therapy: Secondary | ICD-10-CM | POA: Diagnosis not present

## 2019-06-22 LAB — COMPREHENSIVE METABOLIC PANEL
ALT: 37 U/L (ref 0–44)
AST: 54 U/L — ABNORMAL HIGH (ref 15–41)
Albumin: 2.3 g/dL — ABNORMAL LOW (ref 3.5–5.0)
Alkaline Phosphatase: 99 U/L (ref 38–126)
Anion gap: 7 (ref 5–15)
BUN: 13 mg/dL (ref 8–23)
CO2: 22 mmol/L (ref 22–32)
Calcium: 8.2 mg/dL — ABNORMAL LOW (ref 8.9–10.3)
Chloride: 108 mmol/L (ref 98–111)
Creatinine, Ser: 0.94 mg/dL (ref 0.61–1.24)
GFR calc Af Amer: >60 mL/min (ref >60)
GFR calc non Af Amer: >60 mL/min (ref >60)
Glucose, Bld: 159 mg/dL — ABNORMAL HIGH (ref 70–99)
Potassium: 4.1 mmol/L (ref 3.5–5.1)
Sodium: 137 mmol/L (ref 135–145)
Total Bilirubin: 0.9 mg/dL (ref 0.3–1.2)
Total Protein: 6.1 g/dL — ABNORMAL LOW (ref 6.5–8.1)

## 2019-06-22 LAB — CBC WITH DIFFERENTIAL/PLATELET
Abs Immature Granulocytes: 0.47 10*3/uL — ABNORMAL HIGH (ref 0.00–0.07)
Basophils Absolute: 0.1 10*3/uL (ref 0.0–0.1)
Basophils Relative: 1 %
Eosinophils Absolute: 0.1 10*3/uL (ref 0.0–0.5)
Eosinophils Relative: 1 %
HCT: 21.9 % — ABNORMAL LOW (ref 39.0–52.0)
Hemoglobin: 7.3 g/dL — ABNORMAL LOW (ref 13.0–17.0)
Immature Granulocytes: 6 %
Lymphocytes Relative: 12 %
Lymphs Abs: 0.9 10*3/uL (ref 0.7–4.0)
MCH: 33.3 pg (ref 26.0–34.0)
MCHC: 33.3 g/dL (ref 30.0–36.0)
MCV: 100 fL (ref 80.0–100.0)
Monocytes Absolute: 1.2 10*3/uL — ABNORMAL HIGH (ref 0.1–1.0)
Monocytes Relative: 16 %
Neutro Abs: 4.8 10*3/uL (ref 1.7–7.7)
Neutrophils Relative %: 64 %
Platelets: 212 10*3/uL (ref 150–400)
RBC: 2.19 MIL/uL — ABNORMAL LOW (ref 4.22–5.81)
RDW: 20.3 % — ABNORMAL HIGH (ref 11.5–15.5)
WBC: 7.4 10*3/uL (ref 4.0–10.5)
nRBC: 0 % (ref 0.0–0.2)

## 2019-06-22 LAB — TSH: TSH: 9.488 u[IU]/mL — ABNORMAL HIGH (ref 0.350–4.500)

## 2019-06-22 MED ORDER — SODIUM CHLORIDE 0.9 % IV SOLN
500.0000 mg/m2 | Freq: Once | INTRAVENOUS | Status: AC
  Administered 2019-06-22: 850 mg via INTRAVENOUS
  Filled 2019-06-22: qty 20

## 2019-06-22 MED ORDER — SODIUM CHLORIDE 0.9 % IV SOLN
10.0000 mg | Freq: Once | INTRAVENOUS | Status: AC
  Administered 2019-06-22: 10 mg via INTRAVENOUS
  Filled 2019-06-22: qty 10

## 2019-06-22 MED ORDER — SODIUM CHLORIDE 0.9 % IV SOLN
Freq: Once | INTRAVENOUS | Status: AC
  Filled 2019-06-22: qty 250

## 2019-06-22 MED ORDER — CYANOCOBALAMIN 1000 MCG/ML IJ SOLN
1000.0000 ug | Freq: Once | INTRAMUSCULAR | Status: AC
  Administered 2019-06-22: 1000 ug via INTRAMUSCULAR
  Filled 2019-06-22: qty 1

## 2019-06-22 MED ORDER — HEPARIN SOD (PORK) LOCK FLUSH 100 UNIT/ML IV SOLN
500.0000 [IU] | Freq: Once | INTRAVENOUS | Status: AC | PRN
  Administered 2019-06-22: 500 [IU]
  Filled 2019-06-22: qty 5

## 2019-06-22 MED ORDER — SODIUM CHLORIDE 0.9 % IV SOLN
200.0000 mg | Freq: Once | INTRAVENOUS | Status: AC
  Administered 2019-06-22: 200 mg via INTRAVENOUS
  Filled 2019-06-22: qty 8

## 2019-06-22 MED ORDER — PALONOSETRON HCL INJECTION 0.25 MG/5ML
0.2500 mg | Freq: Once | INTRAVENOUS | Status: AC
  Administered 2019-06-22: 0.25 mg via INTRAVENOUS
  Filled 2019-06-22: qty 5

## 2019-06-22 MED ORDER — SODIUM CHLORIDE 0.9 % IV SOLN
380.0000 mg | Freq: Once | INTRAVENOUS | Status: AC
  Administered 2019-06-22: 380 mg via INTRAVENOUS
  Filled 2019-06-22: qty 38

## 2019-06-22 MED ORDER — SODIUM CHLORIDE 0.9% FLUSH
10.0000 mL | INTRAVENOUS | Status: DC | PRN
  Administered 2019-06-22: 10 mL via INTRAVENOUS
  Filled 2019-06-22: qty 10

## 2019-06-22 MED ORDER — SODIUM CHLORIDE 0.9 % IV SOLN
150.0000 mg | Freq: Once | INTRAVENOUS | Status: AC
  Administered 2019-06-22: 150 mg via INTRAVENOUS
  Filled 2019-06-22: qty 150

## 2019-06-22 NOTE — Progress Notes (Signed)
Hemoglobin 7.3, per Dr. Grayland Ormond okay to proceed with treatment as scheduled.

## 2019-06-23 ENCOUNTER — Inpatient Hospital Stay: Payer: Medicare Other

## 2019-06-23 LAB — T4: T4, Total: 7.9 ug/dL (ref 4.5–12.0)

## 2019-06-26 ENCOUNTER — Other Ambulatory Visit: Payer: Self-pay

## 2019-06-26 ENCOUNTER — Emergency Department: Payer: Medicare Other

## 2019-06-26 ENCOUNTER — Inpatient Hospital Stay
Admission: EM | Admit: 2019-06-26 | Discharge: 2019-06-30 | DRG: 871 | Disposition: A | Discharge status: Home Health | Payer: Medicare Other | Attending: Family Medicine | Admitting: Family Medicine

## 2019-06-26 DIAGNOSIS — Z79890 Hormone replacement therapy (postmenopausal): Secondary | ICD-10-CM

## 2019-06-26 DIAGNOSIS — Z6822 Body mass index (BMI) 22.0-22.9, adult: Secondary | ICD-10-CM

## 2019-06-26 DIAGNOSIS — F17210 Nicotine dependence, cigarettes, uncomplicated: Secondary | ICD-10-CM | POA: Diagnosis present

## 2019-06-26 DIAGNOSIS — D649 Anemia, unspecified: Secondary | ICD-10-CM

## 2019-06-26 DIAGNOSIS — Z85819 Personal history of malignant neoplasm of unspecified site of lip, oral cavity, and pharynx: Secondary | ICD-10-CM

## 2019-06-26 DIAGNOSIS — R4182 Altered mental status, unspecified: Secondary | ICD-10-CM

## 2019-06-26 DIAGNOSIS — G9341 Metabolic encephalopathy: Secondary | ICD-10-CM | POA: Diagnosis present

## 2019-06-26 DIAGNOSIS — K7460 Unspecified cirrhosis of liver: Secondary | ICD-10-CM | POA: Diagnosis present

## 2019-06-26 DIAGNOSIS — R571 Hypovolemic shock: Secondary | ICD-10-CM | POA: Diagnosis present

## 2019-06-26 DIAGNOSIS — I959 Hypotension, unspecified: Secondary | ICD-10-CM | POA: Diagnosis present

## 2019-06-26 DIAGNOSIS — T451X5A Adverse effect of antineoplastic and immunosuppressive drugs, initial encounter: Secondary | ICD-10-CM | POA: Diagnosis present

## 2019-06-26 DIAGNOSIS — K7010 Alcoholic hepatitis without ascites: Secondary | ICD-10-CM | POA: Diagnosis present

## 2019-06-26 DIAGNOSIS — E039 Hypothyroidism, unspecified: Secondary | ICD-10-CM | POA: Diagnosis present

## 2019-06-26 DIAGNOSIS — Z20822 Contact with and (suspected) exposure to covid-19: Secondary | ICD-10-CM | POA: Diagnosis present

## 2019-06-26 DIAGNOSIS — J449 Chronic obstructive pulmonary disease, unspecified: Secondary | ICD-10-CM | POA: Diagnosis present

## 2019-06-26 DIAGNOSIS — K529 Noninfective gastroenteritis and colitis, unspecified: Secondary | ICD-10-CM

## 2019-06-26 DIAGNOSIS — F0390 Unspecified dementia without behavioral disturbance: Secondary | ICD-10-CM | POA: Diagnosis present

## 2019-06-26 DIAGNOSIS — B1920 Unspecified viral hepatitis C without hepatic coma: Secondary | ICD-10-CM | POA: Diagnosis present

## 2019-06-26 DIAGNOSIS — Z79899 Other long term (current) drug therapy: Secondary | ICD-10-CM

## 2019-06-26 DIAGNOSIS — D6959 Other secondary thrombocytopenia: Secondary | ICD-10-CM | POA: Diagnosis present

## 2019-06-26 DIAGNOSIS — Z803 Family history of malignant neoplasm of breast: Secondary | ICD-10-CM

## 2019-06-26 DIAGNOSIS — R64 Cachexia: Secondary | ICD-10-CM | POA: Diagnosis present

## 2019-06-26 DIAGNOSIS — A09 Infectious gastroenteritis and colitis, unspecified: Secondary | ICD-10-CM | POA: Diagnosis present

## 2019-06-26 DIAGNOSIS — Z66 Do not resuscitate: Secondary | ICD-10-CM | POA: Diagnosis present

## 2019-06-26 DIAGNOSIS — R6521 Severe sepsis with septic shock: Secondary | ICD-10-CM | POA: Diagnosis present

## 2019-06-26 DIAGNOSIS — A419 Sepsis, unspecified organism: Secondary | ICD-10-CM | POA: Diagnosis not present

## 2019-06-26 DIAGNOSIS — I10 Essential (primary) hypertension: Secondary | ICD-10-CM | POA: Diagnosis present

## 2019-06-26 DIAGNOSIS — E861 Hypovolemia: Secondary | ICD-10-CM | POA: Diagnosis present

## 2019-06-26 DIAGNOSIS — Z6820 Body mass index (BMI) 20.0-20.9, adult: Secondary | ICD-10-CM

## 2019-06-26 DIAGNOSIS — E876 Hypokalemia: Secondary | ICD-10-CM | POA: Diagnosis present

## 2019-06-26 DIAGNOSIS — D6481 Anemia due to antineoplastic chemotherapy: Secondary | ICD-10-CM | POA: Diagnosis present

## 2019-06-26 DIAGNOSIS — Z8521 Personal history of malignant neoplasm of larynx: Secondary | ICD-10-CM

## 2019-06-26 DIAGNOSIS — C7951 Secondary malignant neoplasm of bone: Secondary | ICD-10-CM | POA: Diagnosis present

## 2019-06-26 DIAGNOSIS — N400 Enlarged prostate without lower urinary tract symptoms: Secondary | ICD-10-CM | POA: Diagnosis present

## 2019-06-26 DIAGNOSIS — Z515 Encounter for palliative care: Secondary | ICD-10-CM | POA: Diagnosis present

## 2019-06-26 DIAGNOSIS — C3411 Malignant neoplasm of upper lobe, right bronchus or lung: Secondary | ICD-10-CM | POA: Diagnosis present

## 2019-06-26 DIAGNOSIS — E43 Unspecified severe protein-calorie malnutrition: Secondary | ICD-10-CM | POA: Diagnosis present

## 2019-06-26 DIAGNOSIS — K631 Perforation of intestine (nontraumatic): Secondary | ICD-10-CM | POA: Diagnosis present

## 2019-06-26 MED ORDER — SODIUM CHLORIDE 0.9 % IV BOLUS
1000.0000 mL | Freq: Once | INTRAVENOUS | Status: AC
  Administered 2019-06-27: 1000 mL via INTRAVENOUS

## 2019-06-26 MED ORDER — IOHEXOL 9 MG/ML PO SOLN: 1000.0000 mL | Freq: Once | ORAL | Status: DC

## 2019-06-26 NOTE — ED Provider Notes (Signed)
[facility] Emergency Department Provider Note   ____________________________________________   First MD Initiated Contact with Patient 06/26/19 2351     (approximate)  I have reviewed the triage vital signs and the nursing notes.   HISTORY  Chief Complaint Altered mentation   HPI [name] is a 74 y.o. male brought to the ED via EMS from care home with a chief complaint of altered mental status and hypotension.  Care home staff reports altered mentation x2 days.  Tonight found patient to have low blood pressure.  Patient complaining of abdominal pain.  History of alcohol abuse, alcoholic hepatitis, COPD, dementia, throat cancer.  Patient denies fever, cough, chest pain, shortness of breath, nausea, vomiting, dysuria, diarrhea.  Denies falls.       Past Medical History:  Diagnosis Date  . Alcohol abuse   . Cirrhosis ([place])   . COPD (chronic obstructive pulmonary disease) ([place])   . Dementia ([place])   . Hepatitis C   . Malnutrition ([place])   . Throat cancer [facility])     Patient Active Problem List   Diagnosis Date Noted  . Hypotension 06/27/2019  . Alcoholic hepatitis 42/70/6237  . Anxiety 04/20/2019  . Benign prostatic hyperplasia 04/20/2019  . HCV (hepatitis C virus) 04/20/2019  . History of cocaine use 04/20/2019  . History of fall 04/20/2019  . History of noncompliance with medical treatment 04/20/2019  . History of gastric ulcer 04/20/2019  . Jaw fracture ([place]) 04/20/2019  . Skin erosion 04/20/2019  . Tobacco abuse 04/20/2019  . Urinary retention 04/20/2019  . 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 Near syncope   . Encounter for hospice care discussion   . Dehydration   . Renal insufficiency   . Protein-calorie malnutrition, severe 05/14/2016  . Throat cancer ([place])   . Palliative care encounter   . AKI (acute kidney injury) ([place]) 05/13/2016  . Goals of care, counseling/discussion 03/01/2016  . 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 Alcohol abuse 11/18/2015  . Dementia with behavioral disturbance ([place]) 10/21/2015    Past Surgical History:  Procedure Laterality Date  . IR IMAGING GUIDED PORT INSERTION  04/28/2019  . TONSILLECTOMY     age was 42  . VASECTOMY      Prior to Admission medications   Medication Sig Start Date End Date Taking? Authorizing Provider  alum & mag hydroxide-simeth (MAALOX/MYLANTA) 200-200-20 MG/5ML suspension Take 15 mLs by mouth as needed for indigestion or heartburn. 05/15/16  Yes Gouru, Illene Silver, MD  buPROPion (WELLBUTRIN SR) 150 MG 12 hr tablet bupropion HCl SR 150 mg tablet,12 hr sustained-release   Yes [provider]  docusate (COLACE) 50 MG/5ML liquid Take 100 mg by mouth daily.    Yes [provider]  ferrous sulfate 325 (65 FE) MG tablet Take 325 mg by mouth daily with breakfast.   Yes [provider]  folic acid (FOLVITE) 1 MG tablet Take 1 mg by mouth daily.   Yes [provider]  hydrOXYzine (ATARAX/VISTARIL) 25 MG tablet Take 100 mg by mouth at bedtime.  02/16/19  Yes [provider]  lactose free nutrition (BOOST PLUS) LIQD Take 237 mLs by mouth 4 (four) times daily.   Yes [provider]  levothyroxine (SYNTHROID) 100 MCG tablet Take 100 mcg by mouth daily before breakfast.   Yes [provider]  lidocaine-prilocaine (EMLA) cream Apply to affected area once 05/02/19  Yes Lloyd Huger, MD  losartan (COZAAR) 25 MG tablet Take 25 mg  by mouth daily. 03/20/19  Yes [provider]  mirtazapine (REMERON) 15 MG tablet Take 15 mg by mouth at bedtime. 03/20/19  Yes [provider]  mupirocin ointment (BACTROBAN) 2 % Apply 1 application topically 2 (two) times daily.   Yes [provider]  ondansetron (ZOFRAN) 8 MG tablet Take by mouth every 8 (eight) hours as needed for nausea or vomiting.   Yes [provider]  polyethylene glycol (MIRALAX / GLYCOLAX) 17 g packet Take 17 g by mouth  daily.   Yes [provider]  prochlorperazine (COMPAZINE) 10 MG tablet Take 1 tablet (10 mg total) by mouth every 6 (six) hours as needed (Nausea or vomiting). 05/02/19  Yes Lloyd Huger, MD  QUEtiapine (SEROQUEL) 25 MG tablet Take 25 mg by mouth 3 (three) times daily.   Yes [provider]  QUEtiapine (SEROQUEL) 50 MG tablet Take 1 tablet (50 mg total) by mouth at bedtime. Patient taking differently: Take 50 mg by mouth at bedtime. Takes with a 25 mg tablet 05/26/16  Yes Mody, Ulice Bold, MD  tamsulosin (FLOMAX) 0.4 MG CAPS capsule Take 0.8 mg by mouth daily after breakfast.  10/14/15  Yes [provider]  thiamine 100 MG tablet Take 100 mg by mouth daily.   Yes [provider]  TRELEGY ELLIPTA 100-62.5-25 MCG/INH AEPB Inhale 1 puff into the lungs in the morning.  02/28/19  Yes [provider]  vitamin C (ASCORBIC ACID) 250 MG tablet Take 500 mg by mouth daily.    Yes [provider]  Vitamin D, Ergocalciferol, (DRISDOL) 1.25 MG (50000 UNIT) CAPS capsule Take 50,000 Units by mouth every 7 (seven) days.   Yes [provider]  Cholecalciferol (VITAMIN D3) 5000 units TABS Take 5,000 Units by mouth daily.    [provider]    Allergies Penicillins  Family History  Problem Relation Age of Onset  . Breast cancer Mother     Social History Social History   Tobacco Use  . Smoking status: Current Every Day Smoker    Packs/day: 0.50    Years: 63.00    Pack years: 31.50    Types: Cigarettes  . Smokeless tobacco: Never Used  Substance Use Topics  . Alcohol use: Not Currently    Alcohol/week: 1.0 standard drinks    Types: 1 Cans of beer per week  . Drug use: No    Review of Systems  Constitutional: No fever/chills Eyes: No visual changes. ENT: No sore throat. Cardiovascular: Denies chest pain. Respiratory: Denies shortness of breath. Gastrointestinal: Positive for abdominal pain.  No nausea, no vomiting.  No  diarrhea.  No constipation. Genitourinary: Negative for dysuria. Musculoskeletal: Negative for back pain. Skin: Negative for rash. Neurological: Positive for altered mental status.  Negative for headaches, focal weakness or numbness.   ____________________________________________   PHYSICAL EXAM:  VITAL SIGNS: ED Triage Vitals  Enc Vitals Group     BP      Pulse      Resp      Temp      Temp src      SpO2      Weight      Height      Head Circumference      Peak Flow      Pain Score      Pain Loc      Pain Edu?      Excl. in [place]?     Constitutional: Alert and oriented.  Cachectic appearing and  in mild acute distress. Eyes: Conjunctivae are normal. PERRL. EOMI. Head: Atraumatic. Nose: No congestion/rhinnorhea. Mouth/Throat: Mucous membranes are mildly dry. Neck: No stridor.   Cardiovascular: Normal rate, regular rhythm. Grossly normal heart sounds.  Good peripheral circulation. Respiratory: Normal respiratory effort.  No retractions. Lungs CTAB. Gastrointestinal: Soft with mild diffuse tenderness to palpation without rebound or guarding.  Mild distention. No abdominal bruits. No CVA tenderness. Musculoskeletal: No lower extremity tenderness nor edema.  No joint effusions. Neurologic: Alert and oriented to person and place.  Normal speech and language. No gross focal neurologic deficits are appreciated.  Skin:  Skin is warm, dry and intact. No rash noted. Psychiatric: Mood and affect are normal. Speech and behavior are normal.  ____________________________________________   LABS (all labs ordered are listed, but only abnormal results are displayed)  Labs Reviewed  CBC WITH DIFFERENTIAL/PLATELET - Abnormal; Notable for the following components:      Result Value   RBC 1.79 (*)    Hemoglobin 6.1 (*)    HCT 17.8 (*)    MCH 34.1 (*)    RDW 20.3 (*)    Platelets 124 (*)    Abs Immature Granulocytes 0.58 (*)    All other components within normal limits    COMPREHENSIVE METABOLIC PANEL - Abnormal; Notable for the following components:   Potassium 2.8 (*)    Chloride 117 (*)    CO2 18 (*)    BUN 31 (*)    Calcium 6.4 (*)    Total Protein 4.4 (*)    Albumin 1.7 (*)    Total Bilirubin 1.6 (*)    Anion gap 4 (*)    All other components within normal limits  AMMONIA - Abnormal; Notable for the following components:   Ammonia <9 (*)    All other components within normal limits  PROTIME-INR - Abnormal; Notable for the following components:   Prothrombin Time 19.3 (*)    INR 1.7 (*)    All other components within normal limits  SARS CORONAVIRUS 2 BY RT PCR (HOSPITAL ORDER, [facility])  CULTURE, BLOOD (ROUTINE X 2)  CULTURE, BLOOD (ROUTINE X 2)  URINE CULTURE  LACTIC ACID, PLASMA  LIPASE, BLOOD  ETHANOL  LACTIC ACID, PLASMA  URINALYSIS, COMPLETE (UACMP) WITH MICROSCOPIC  PROCALCITONIN  COMPREHENSIVE METABOLIC PANEL  MAGNESIUM  PHOSPHORUS  TYPE AND SCREEN  PREPARE RBC (CROSSMATCH)  ABO/RH  TROPONIN I (HIGH SENSITIVITY)  TROPONIN I (HIGH SENSITIVITY)   ____________________________________________  EKG  ED ECG REPORT I, Sequoya Hogsett J, the attending physician, personally viewed and interpreted this ECG.   Date: 06/26/2019  EKG Time: 2357  Rate: 88  Rhythm: normal EKG, normal sinus rhythm  Axis: Normal  Intervals:nonspecific intraventricular conduction delay  ST&T Change: Nonspecific  ____________________________________________  RADIOLOGY  ED MD interpretation: Chest x-ray demonstrates pulmonary mass, chronic lung changes; CT head without intracranial hemorrhage; CT abdomen/pelvis discussed with radiologist demonstrates diffuse colitis with possible microperforation  Official radiology report(s): CT Head Wo Contrast  Result Date: 06/27/2019 CLINICAL DATA:  Hypotension, altered level of consciousness, history of head and neck and lung cancer, cirrhosis, dementia EXAM: CT HEAD WITHOUT CONTRAST  TECHNIQUE: Contiguous axial images were obtained from the base of the skull through the vertex without intravenous contrast. 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 No signs of acute infarct or hemorrhage. Lateral ventricles and midline structures are stable. No acute extra-axial fluid collections. Chronic dural thickening right frontal  para falcine region, stable. No mass effect. Vascular: No hyperdense vessel or unexpected calcification. Skull: Normal. Negative for fracture or focal lesion. Sinuses/Orbits: Mild mucosal thickening throughout the ethmoid and maxillary sinuses. Other: None. IMPRESSION: 1. Chronic ischemic changes, no acute process. Electronically Signed   By: Randa Ngo M.D.   On: 06/27/2019 00:49   CT Abdomen Pelvis W Contrast  Result Date: 06/27/2019 CLINICAL DATA:  Abdominal distension, hypotensive EXAM: CT ABDOMEN AND PELVIS WITH CONTRAST TECHNIQUE: Multidetector CT imaging of the abdomen and pelvis was performed using the standard protocol following bolus administration of intravenous contrast. CONTRAST:  127mL OMNIPAQUE IOHEXOL 300 MG/ML  SOLN COMPARISON:  March 30, 2019 FINDINGS: Lower chest: The visualized heart size within normal limits. There is a trace pericardial effusion. A small right pleural effusion is present. Coarse calcifications are seen along both lung bases. Hepatobiliary: There is a shrunken nodular contour to the liver parenchyma.The main portal vein is patent. Layering calcified gallstones are present. Pancreas: Unremarkable. No pancreatic ductal dilatation or surrounding inflammatory changes. Spleen: Normal in size without focal abnormality. Adrenals/Urinary Tract: Both adrenal glands appear normal. There is a small 1 cm low-density lesion seen within the upper pole of the right kidney. The left kidney is unremarkable. Bladder is unremarkable. Stomach/Bowel: The  stomach and small bowel are grossly unremarkable. There is significant wall thickening and surrounding fat stranding changes seen predominantly around the right colon and cecum as well as the transverse colon and proximal descending colon. There is bubbly foci air within the right upper quadrant, likely thought to be within the cecum, however on series 2, image 37 there are 2 foci of air which could be extraluminal. No definite loculated fluid collections are seen. There scattered colonic diverticulosis.The patient is status post appendectomy. Vascular/Lymphatic: There are no enlarged mesenteric, retroperitoneal, or pelvic lymph nodes. Scattered aortic atherosclerotic calcifications are seen without aneurysmal dilatation. Reproductive: The prostate is unremarkable. Other: Diffuse mild mesenteric edema is seen throughout. A small amount of deep pelvic ascites is present. Musculoskeletal: No acute or significant osseous findings. IMPRESSION: 1. Findings consistent with extensive acute colitis involving the right colon, cecum, transverse colon and proximal descending colon. This is most significant around the cecum with question a small foci of extraluminal air suggestive of perforation as described above. No definite loculated fluid collections are seen. 2. Small amount of deep pelvic ascites. 3. Small right pleural effusion and trace pericardial effusion 4. Findings suggestive of cirrhosis 5. Cholelithiasis 6.  Aortic Atherosclerosis (ICD10-I70.0). 7. These results were called by telephone at the time of interpretation on 06/27/2019 at 1:02 am to provider Elber Galyean , who verbally acknowledged these results. Electronically Signed   By: Prudencio Pair M.D.   On: 06/27/2019 01:03   DG Chest Port 1 View  Result Date: 06/27/2019 CLINICAL DATA:  Confusion EXAM: PORTABLE CHEST 1 VIEW COMPARISON:  April 12, 2019 FINDINGS: The heart size and mediastinal contours are within normal limits. Aortic knob calcifications are  seen. Again noted is a spiculated nodular opacity within the right upper lung. There is prominence of the central pulmonary vasculature. There is mildly increased interstitial markings seen throughout both lungs as on the prior exam. No pleural effusion is seen. No acute osseous abnormality. A right-sided MediPort catheter seen with the tip at the superior cavoatrial junction. IMPRESSION: Spiculated nodular opacity at the right upper lung, consistent with the patient's known pulmonary mass Mildly increased interstitial markings throughout both lungs as on prior exam which may be due to chronic  lung changes Electronically Signed   By: Prudencio Pair M.D.   On: 06/27/2019 00:10    ____________________________________________   PROCEDURES  Procedure(s) performed (including Critical Care):  .1-3 Lead EKG Interpretation Performed by: Paulette Blanch, MD Authorized by: Paulette Blanch, MD     Interpretation: normal     ECG rate:  85   ECG rate assessment: normal     Rhythm: sinus rhythm     Ectopy: none     Conduction: normal   Comments:     Patient placed on cardiac monitor to monitor for arrhythmias   CRITICAL CARE Performed by: Paulette Blanch   Total critical care time: 60 minutes  Critical care time was exclusive of separately billable procedures and treating other patients.  Critical care was necessary to treat or prevent imminent or life-threatening deterioration.  Critical care was time spent personally by me on the following activities: development of treatment plan with patient and/or surrogate as well as nursing, discussions with consultants, evaluation of patient's response to treatment, examination of patient, obtaining history from patient or surrogate, ordering and performing treatments and interventions, ordering and review of laboratory studies, ordering and review of radiographic studies, pulse oximetry and re-evaluation of patient's  condition.   ____________________________________________   INITIAL IMPRESSION / ASSESSMENT AND PLAN / ED COURSE  As part of my medical decision making, I reviewed the following data within the [place] notes reviewed and incorporated, Labs reviewed, EKG interpreted, Old chart reviewed, Radiograph reviewed, Discussed with admitting physician and Notes from prior ED visits     [name] was evaluated in Emergency Department on 06/27/2019 for the symptoms described in the history of present illness. He was evaluated in the context of the global COVID-19 pandemic, which necessitated consideration that the patient might be at risk for infection with the SARS-CoV-2 virus that causes COVID-19. Institutional protocols and algorithms that pertain to the evaluation of patients at risk for COVID-19 are in a state of rapid change based on information released by regulatory bodies including the CDC and federal and state organizations. These policies and algorithms were followed during the patient's care in the ED.    74 year old male presenting with altered mentation, hypotension and abdominal pain.  Differential diagnosis includes but is not limited to [place], sepsis, ACS, H CAP, AAA, metabolic etiologies, etc.  Will obtain sepsis work-up, check ammonia, INR, type and screen.  Obtain chest x-ray, CT imaging of head and abdomen/pelvis with IV contrast only to go urgently.  I have personally reviewed patient's chart and see that he had cycle 3 infusion of carboplatinum, pemetrexed and pembrolizumab on 5/6 for lung cancer.  H/H at that time was 7/21.  This was down from 8/25 on 06/01/2019.  He was not transfused at that time.   Clinical Course as of Jun 27 339  Tue Jun 27, 2019  0058 BP 96/51. Discussed with radiologist regarding CT scan starting for diffuse colitis with 2 foci of air possibly indicating perforation.  Will start empiric broad-spectrum IV antibiotics.   [JS]   0128 Discussed with CCU NP Shary Decamp who will admit patient.   [JS]    Clinical Course User Index [JS] Paulette Blanch, MD     ____________________________________________   FINAL CLINICAL IMPRESSION(S) / ED DIAGNOSES  Final diagnoses:  Altered mental status, unspecified altered mental status type  Hypotension, unspecified hypotension type  Colitis  Sepsis, due to unspecified organism, unspecified whether acute organ dysfunction present (HCC)  Symptomatic  anemia  Hypocalcemia     ED Discharge Orders    None       Note:  This document was prepared using Dragon voice recognition software and may include unintentional dictation errors.   Paulette Blanch, MD 06/27/19 [phone]

## 2019-06-27 ENCOUNTER — Other Ambulatory Visit: Payer: Self-pay

## 2019-06-27 ENCOUNTER — Emergency Department: Payer: Medicare Other

## 2019-06-27 DIAGNOSIS — F17210 Nicotine dependence, cigarettes, uncomplicated: Secondary | ICD-10-CM | POA: Diagnosis present

## 2019-06-27 DIAGNOSIS — C3491 Malignant neoplasm of unspecified part of right bronchus or lung: Secondary | ICD-10-CM | POA: Diagnosis not present

## 2019-06-27 DIAGNOSIS — K631 Perforation of intestine (nontraumatic): Secondary | ICD-10-CM | POA: Diagnosis present

## 2019-06-27 DIAGNOSIS — Z20822 Contact with and (suspected) exposure to covid-19: Secondary | ICD-10-CM | POA: Diagnosis present

## 2019-06-27 DIAGNOSIS — R4182 Altered mental status, unspecified: Secondary | ICD-10-CM

## 2019-06-27 DIAGNOSIS — Z66 Do not resuscitate: Secondary | ICD-10-CM | POA: Diagnosis present

## 2019-06-27 DIAGNOSIS — A419 Sepsis, unspecified organism: Secondary | ICD-10-CM | POA: Diagnosis present

## 2019-06-27 DIAGNOSIS — C7951 Secondary malignant neoplasm of bone: Secondary | ICD-10-CM | POA: Diagnosis present

## 2019-06-27 DIAGNOSIS — I959 Hypotension, unspecified: Secondary | ICD-10-CM | POA: Diagnosis present

## 2019-06-27 DIAGNOSIS — A09 Infectious gastroenteritis and colitis, unspecified: Secondary | ICD-10-CM | POA: Diagnosis present

## 2019-06-27 DIAGNOSIS — I9589 Other hypotension: Secondary | ICD-10-CM

## 2019-06-27 DIAGNOSIS — D6481 Anemia due to antineoplastic chemotherapy: Secondary | ICD-10-CM | POA: Diagnosis present

## 2019-06-27 DIAGNOSIS — K529 Noninfective gastroenteritis and colitis, unspecified: Secondary | ICD-10-CM | POA: Diagnosis not present

## 2019-06-27 DIAGNOSIS — E43 Unspecified severe protein-calorie malnutrition: Secondary | ICD-10-CM | POA: Diagnosis present

## 2019-06-27 DIAGNOSIS — D6959 Other secondary thrombocytopenia: Secondary | ICD-10-CM | POA: Diagnosis present

## 2019-06-27 DIAGNOSIS — R571 Hypovolemic shock: Secondary | ICD-10-CM | POA: Diagnosis present

## 2019-06-27 DIAGNOSIS — E876 Hypokalemia: Secondary | ICD-10-CM | POA: Diagnosis present

## 2019-06-27 DIAGNOSIS — J449 Chronic obstructive pulmonary disease, unspecified: Secondary | ICD-10-CM | POA: Diagnosis present

## 2019-06-27 DIAGNOSIS — I10 Essential (primary) hypertension: Secondary | ICD-10-CM | POA: Diagnosis present

## 2019-06-27 DIAGNOSIS — N400 Enlarged prostate without lower urinary tract symptoms: Secondary | ICD-10-CM | POA: Diagnosis present

## 2019-06-27 DIAGNOSIS — Z515 Encounter for palliative care: Secondary | ICD-10-CM | POA: Diagnosis present

## 2019-06-27 DIAGNOSIS — B1920 Unspecified viral hepatitis C without hepatic coma: Secondary | ICD-10-CM | POA: Diagnosis present

## 2019-06-27 DIAGNOSIS — E861 Hypovolemia: Secondary | ICD-10-CM | POA: Diagnosis present

## 2019-06-27 DIAGNOSIS — F0390 Unspecified dementia without behavioral disturbance: Secondary | ICD-10-CM | POA: Diagnosis present

## 2019-06-27 DIAGNOSIS — C3411 Malignant neoplasm of upper lobe, right bronchus or lung: Secondary | ICD-10-CM | POA: Diagnosis present

## 2019-06-27 DIAGNOSIS — R6521 Severe sepsis with septic shock: Secondary | ICD-10-CM | POA: Diagnosis present

## 2019-06-27 DIAGNOSIS — R64 Cachexia: Secondary | ICD-10-CM | POA: Diagnosis present

## 2019-06-27 DIAGNOSIS — G9341 Metabolic encephalopathy: Secondary | ICD-10-CM | POA: Diagnosis present

## 2019-06-27 LAB — CBC WITH DIFFERENTIAL/PLATELET
Abs Immature Granulocytes: 0.58 10*3/uL — ABNORMAL HIGH (ref 0.00–0.07)
Abs Immature Granulocytes: 1.33 10*3/uL — ABNORMAL HIGH (ref 0.00–0.07)
Basophils Absolute: 0 10*3/uL (ref 0.0–0.1)
Basophils Absolute: 0 10*3/uL (ref 0.0–0.1)
Basophils Relative: 0 %
Basophils Relative: 0 %
Eosinophils Absolute: 0 10*3/uL (ref 0.0–0.5)
Eosinophils Absolute: 0 10*3/uL (ref 0.0–0.5)
Eosinophils Relative: 0 %
Eosinophils Relative: 0 %
HCT: 17.8 % — ABNORMAL LOW (ref 39.0–52.0)
HCT: 22.7 % — ABNORMAL LOW (ref 39.0–52.0)
Hemoglobin: 6.1 g/dL — ABNORMAL LOW (ref 13.0–17.0)
Hemoglobin: 7.9 g/dL — ABNORMAL LOW (ref 13.0–17.0)
Immature Granulocytes: 7 %
Immature Granulocytes: 14 %
Lymphocytes Relative: 8 %
Lymphocytes Relative: 5 %
Lymphs Abs: 0.7 10*3/uL (ref 0.7–4.0)
Lymphs Abs: 0.4 10*3/uL — ABNORMAL LOW (ref 0.7–4.0)
MCH: 34.1 pg — ABNORMAL HIGH (ref 26.0–34.0)
MCH: 32.6 pg (ref 26.0–34.0)
MCHC: 34.3 g/dL (ref 30.0–36.0)
MCHC: 34.8 g/dL (ref 30.0–36.0)
MCV: 99.4 fL (ref 80.0–100.0)
MCV: 93.8 fL (ref 80.0–100.0)
Monocytes Absolute: 0.4 10*3/uL (ref 0.1–1.0)
Monocytes Absolute: 0.5 10*3/uL (ref 0.1–1.0)
Monocytes Relative: 5 %
Monocytes Relative: 5 %
Neutro Abs: 7 10*3/uL (ref 1.7–7.7)
Neutro Abs: 7.4 10*3/uL (ref 1.7–7.7)
Neutrophils Relative %: 80 %
Neutrophils Relative %: 76 %
Platelets: 124 10*3/uL — ABNORMAL LOW (ref 150–400)
Platelets: 126 10*3/uL — ABNORMAL LOW (ref 150–400)
RBC: 1.79 MIL/uL — ABNORMAL LOW (ref 4.22–5.81)
RBC: 2.42 MIL/uL — ABNORMAL LOW (ref 4.22–5.81)
RDW: 20.3 % — ABNORMAL HIGH (ref 11.5–15.5)
RDW: 22.3 % — ABNORMAL HIGH (ref 11.5–15.5)
Smear Review: NORMAL
WBC: 8.7 10*3/uL (ref 4.0–10.5)
WBC: 9.7 10*3/uL (ref 4.0–10.5)
nRBC: 0 % (ref 0.0–0.2)
nRBC: 0 % (ref 0.0–0.2)

## 2019-06-27 LAB — PHOSPHORUS: Phosphorus: 2.5 mg/dL (ref 2.5–4.6)

## 2019-06-27 LAB — URINALYSIS, COMPLETE (UACMP) WITH MICROSCOPIC
Bacteria, UA: NONE SEEN
Bilirubin Urine: NEGATIVE
Glucose, UA: NEGATIVE mg/dL
Hgb urine dipstick: NEGATIVE
Ketones, ur: NEGATIVE mg/dL
Leukocytes,Ua: NEGATIVE
Nitrite: NEGATIVE
Protein, ur: NEGATIVE mg/dL
Specific Gravity, Urine: 1.042 — ABNORMAL HIGH (ref 1.005–1.030)
Squamous Epithelial / HPF: NONE SEEN (ref 0–5)
pH: 5 (ref 5.0–8.0)

## 2019-06-27 LAB — COMPREHENSIVE METABOLIC PANEL
ALT: 35 U/L (ref 0–44)
ALT: 43 U/L (ref 0–44)
ALT: 42 U/L (ref 0–44)
AST: 37 U/L (ref 15–41)
AST: 45 U/L — ABNORMAL HIGH (ref 15–41)
AST: 48 U/L — ABNORMAL HIGH (ref 15–41)
Albumin: 1.7 g/dL — ABNORMAL LOW (ref 3.5–5.0)
Albumin: 2.1 g/dL — ABNORMAL LOW (ref 3.5–5.0)
Albumin: 2.3 g/dL — ABNORMAL LOW (ref 3.5–5.0)
Alkaline Phosphatase: 48 U/L (ref 38–126)
Alkaline Phosphatase: 63 U/L (ref 38–126)
Alkaline Phosphatase: 67 U/L (ref 38–126)
Anion gap: 4 — ABNORMAL LOW (ref 5–15)
Anion gap: 3 — ABNORMAL LOW (ref 5–15)
Anion gap: 5 (ref 5–15)
BUN: 31 mg/dL — ABNORMAL HIGH (ref 8–23)
BUN: 33 mg/dL — ABNORMAL HIGH (ref 8–23)
BUN: 28 mg/dL — ABNORMAL HIGH (ref 8–23)
CO2: 18 mmol/L — ABNORMAL LOW (ref 22–32)
CO2: 22 mmol/L (ref 22–32)
CO2: 21 mmol/L — ABNORMAL LOW (ref 22–32)
Calcium: 6.4 mg/dL — CL (ref 8.9–10.3)
Calcium: 8 mg/dL — ABNORMAL LOW (ref 8.9–10.3)
Calcium: 8.2 mg/dL — ABNORMAL LOW (ref 8.9–10.3)
Chloride: 117 mmol/L — ABNORMAL HIGH (ref 98–111)
Chloride: 111 mmol/L (ref 98–111)
Chloride: 110 mmol/L (ref 98–111)
Creatinine, Ser: 0.95 mg/dL (ref 0.61–1.24)
Creatinine, Ser: 1.08 mg/dL (ref 0.61–1.24)
Creatinine, Ser: 1.05 mg/dL (ref 0.61–1.24)
GFR calc Af Amer: >60 mL/min (ref >60)
GFR calc Af Amer: >60 mL/min (ref >60)
GFR calc Af Amer: >60 mL/min (ref >60)
GFR calc non Af Amer: >60 mL/min (ref >60)
GFR calc non Af Amer: >60 mL/min (ref >60)
GFR calc non Af Amer: >60 mL/min (ref >60)
Glucose, Bld: 90 mg/dL (ref 70–99)
Glucose, Bld: 103 mg/dL — ABNORMAL HIGH (ref 70–99)
Glucose, Bld: 115 mg/dL — ABNORMAL HIGH (ref 70–99)
Potassium: 2.8 mmol/L — ABNORMAL LOW (ref 3.5–5.1)
Potassium: 4.3 mmol/L (ref 3.5–5.1)
Potassium: 4.3 mmol/L (ref 3.5–5.1)
Sodium: 139 mmol/L (ref 135–145)
Sodium: 136 mmol/L (ref 135–145)
Sodium: 136 mmol/L (ref 135–145)
Total Bilirubin: 1.6 mg/dL — ABNORMAL HIGH (ref 0.3–1.2)
Total Bilirubin: 2.1 mg/dL — ABNORMAL HIGH (ref 0.3–1.2)
Total Bilirubin: 2.5 mg/dL — ABNORMAL HIGH (ref 0.3–1.2)
Total Protein: 4.4 g/dL — ABNORMAL LOW (ref 6.5–8.1)
Total Protein: 5.7 g/dL — ABNORMAL LOW (ref 6.5–8.1)
Total Protein: 6.1 g/dL — ABNORMAL LOW (ref 6.5–8.1)

## 2019-06-27 LAB — LIPASE, BLOOD: Lipase: 19 U/L (ref 11–51)

## 2019-06-27 LAB — LACTIC ACID, PLASMA
Lactic Acid, Venous: 1 mmol/L (ref 0.5–1.9)
Lactic Acid, Venous: 1.5 mmol/L (ref 0.5–1.9)

## 2019-06-27 LAB — ABO/RH: ABO/RH(D): O POS

## 2019-06-27 LAB — MAGNESIUM: Magnesium: 1.4 mg/dL — ABNORMAL LOW (ref 1.7–2.4)

## 2019-06-27 LAB — ETHANOL: Alcohol, Ethyl (B): <10 mg/dL (ref <10)

## 2019-06-27 LAB — AMMONIA: Ammonia: <9 umol/L — ABNORMAL LOW (ref 9–35)

## 2019-06-27 LAB — SARS CORONAVIRUS 2 BY RT PCR (HOSPITAL ORDER, PERFORMED IN ~~LOC~~ HOSPITAL LAB): SARS Coronavirus 2: NEGATIVE

## 2019-06-27 LAB — PROCALCITONIN: Procalcitonin: 9.18 ng/mL

## 2019-06-27 LAB — TROPONIN I (HIGH SENSITIVITY)
Troponin I (High Sensitivity): 9 ng/L (ref <18)
Troponin I (High Sensitivity): 9 ng/L (ref <18)

## 2019-06-27 LAB — PROTIME-INR
INR: 1.7 — ABNORMAL HIGH (ref 0.8–1.2)
Prothrombin Time: 19.3 seconds — ABNORMAL HIGH (ref 11.4–15.2)

## 2019-06-27 LAB — PREPARE RBC (CROSSMATCH)

## 2019-06-27 MED ORDER — CALCIUM GLUCONATE-NACL 1-0.675 GM/50ML-% IV SOLN
1.0000 g | Freq: Once | INTRAVENOUS | Status: AC
  Administered 2019-06-27: 1000 mg via INTRAVENOUS
  Filled 2019-06-27: qty 50

## 2019-06-27 MED ORDER — DOCUSATE SODIUM 100 MG PO CAPS: 100.0000 mg | ORAL_CAPSULE | Freq: Two times a day (BID) | ORAL | Status: DC | PRN

## 2019-06-27 MED ORDER — SODIUM CHLORIDE 0.9 % IV BOLUS (SEPSIS)
1000.0000 mL | Freq: Once | INTRAVENOUS | Status: AC
  Administered 2019-06-27: 1000 mL via INTRAVENOUS

## 2019-06-27 MED ORDER — METRONIDAZOLE IN NACL 5-0.79 MG/ML-% IV SOLN
500.0000 mg | Freq: Three times a day (TID) | INTRAVENOUS | Status: DC
  Administered 2019-06-27 – 2019-06-30 (×9): 500 mg via INTRAVENOUS
  Filled 2019-06-27 (×10): qty 100

## 2019-06-27 MED ORDER — METRONIDAZOLE IN NACL 5-0.79 MG/ML-% IV SOLN: 500.0000 mg | Freq: Three times a day (TID) | INTRAVENOUS | Status: DC

## 2019-06-27 MED ORDER — UMECLIDINIUM BROMIDE 62.5 MCG/INH IN AEPB
1.0000 | INHALATION_SPRAY | Freq: Every day | RESPIRATORY_TRACT | Status: DC
  Administered 2019-06-27 – 2019-06-30 (×4): 1 via RESPIRATORY_TRACT
  Filled 2019-06-27: qty 7

## 2019-06-27 MED ORDER — SODIUM CHLORIDE 0.9 % IV SOLN
2.0000 g | Freq: Once | INTRAVENOUS | Status: AC
  Administered 2019-06-27: 2 g via INTRAVENOUS
  Filled 2019-06-27: qty 2

## 2019-06-27 MED ORDER — FAMOTIDINE IN NACL 20-0.9 MG/50ML-% IV SOLN
20.0000 mg | Freq: Two times a day (BID) | INTRAVENOUS | Status: DC
  Administered 2019-06-27 – 2019-06-30 (×8): 20 mg via INTRAVENOUS
  Filled 2019-06-27 (×8): qty 50

## 2019-06-27 MED ORDER — SODIUM CHLORIDE 0.9 % IV SOLN
10.0000 mL/h | Freq: Once | INTRAVENOUS | Status: AC
  Administered 2019-06-27: 10 mL/h via INTRAVENOUS

## 2019-06-27 MED ORDER — METRONIDAZOLE IN NACL 5-0.79 MG/ML-% IV SOLN
500.0000 mg | Freq: Once | INTRAVENOUS | Status: AC
  Administered 2019-06-27: 500 mg via INTRAVENOUS
  Filled 2019-06-27: qty 100

## 2019-06-27 MED ORDER — SODIUM CHLORIDE 0.9 % IV SOLN: 2.0000 g | Freq: Once | INTRAVENOUS | Status: DC

## 2019-06-27 MED ORDER — SODIUM CHLORIDE 0.9 % IV SOLN: 1.0000 g | Freq: Once | INTRAVENOUS | Status: DC

## 2019-06-27 MED ORDER — IOHEXOL 300 MG/ML  SOLN
100.0000 mL | Freq: Once | INTRAMUSCULAR | Status: AC | PRN
  Administered 2019-06-27: 100 mL via INTRAVENOUS

## 2019-06-27 MED ORDER — POLYETHYLENE GLYCOL 3350 17 G PO PACK: 17.0000 g | PACK | Freq: Every day | ORAL | Status: DC | PRN

## 2019-06-27 MED ORDER — VANCOMYCIN HCL IN DEXTROSE 1-5 GM/200ML-% IV SOLN
1000.0000 mg | Freq: Once | INTRAVENOUS | Status: AC
  Administered 2019-06-27: 1000 mg via INTRAVENOUS
  Filled 2019-06-27: qty 200

## 2019-06-27 MED ORDER — MAGNESIUM SULFATE 4 GM/100ML IV SOLN
4.0000 g | Freq: Once | INTRAVENOUS | Status: AC
  Administered 2019-06-27: 4 g via INTRAVENOUS
  Filled 2019-06-27 (×2): qty 100

## 2019-06-27 MED ORDER — FLUTICASONE FUROATE-VILANTEROL 100-25 MCG/INH IN AEPB
1.0000 | INHALATION_SPRAY | Freq: Every day | RESPIRATORY_TRACT | Status: DC
  Administered 2019-06-27 – 2019-06-30 (×4): 1 via RESPIRATORY_TRACT
  Filled 2019-06-27: qty 28

## 2019-06-27 MED ORDER — FLUTICASONE-UMECLIDIN-VILANT 100-62.5-25 MCG/INH IN AEPB: 1.0000 | INHALATION_SPRAY | Freq: Every morning | RESPIRATORY_TRACT | Status: DC

## 2019-06-27 MED ORDER — ONDANSETRON HCL 4 MG/2ML IJ SOLN: 4.0000 mg | Freq: Four times a day (QID) | INTRAMUSCULAR | Status: DC | PRN

## 2019-06-27 MED ORDER — VANCOMYCIN HCL IN DEXTROSE 1-5 GM/200ML-% IV SOLN: 1000.0000 mg | Freq: Once | INTRAVENOUS | Status: DC

## 2019-06-27 MED ORDER — POTASSIUM CHLORIDE 10 MEQ/100ML IV SOLN
10.0000 meq | INTRAVENOUS | Status: AC
  Administered 2019-06-27: 10 meq via INTRAVENOUS
  Filled 2019-06-27: qty 100

## 2019-06-27 MED ORDER — METRONIDAZOLE IN NACL 5-0.79 MG/ML-% IV SOLN: 500.0000 mg | Freq: Once | INTRAVENOUS | Status: DC

## 2019-06-27 NOTE — ED Notes (Signed)
Pt tolerating PO at this time.

## 2019-06-27 NOTE — Progress Notes (Signed)
PHARMACY CONSULT NOTE - FOLLOW UP  Pharmacy Consult for Electrolyte Monitoring and Replacement   Recent Labs: Potassium (mmol/L)  Date Value  06/27/2019 4.3  09/27/2012 3.7   Magnesium (mg/dL)  Date Value  06/27/2019 1.4 (L)  09/27/2012 1.4 (L)   Calcium (mg/dL)  Date Value  06/27/2019 8.2 (L)   Calcium, Total (mg/dL)  Date Value  09/26/2012 8.6   Albumin (g/dL)  Date Value  06/27/2019 2.3 (L)  09/23/2012 2.1 (L)   Phosphorus (mg/dL)  Date Value  06/27/2019 2.5   Sodium (mmol/L)  Date Value  06/27/2019 136  09/26/2012 137    Assessment: K 4.3, Mag 1.4, Calcium 8.6, albumin 2.3  Goal of Therapy:  Maintain lytes WNL  Plan:  Magnesium Sulfate 4gm IV ordered by ED provider All other electrolytes WNL, will defer any further replacement. Will recheck with AM labs.  Pearla Dubonnet ,PharmD Clinical Pharmacist 06/27/2019 5:09 PM

## 2019-06-27 NOTE — H&P (Signed)
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 This is most significant around the cecum with question a small foci of extraluminal air suggestive of perforation as described above. No definite loculated fluid collections are seen. Small amount of deep pelvic ascites. Small right pleural effusion and trace pericardial effusion Findings suggestive of cirrhosis Cholelithiasis Aortic Atherosclerosis (ICD10-I70.0).  05/11: CT Head revealed chronic ischemic changes, no acute process. 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 He presented to [facility] ER on 05/11 via EMS from [facility] with altered mental status and hypotension.  EMS reported pts bp 80/60.  ER labs revealed K+ 2.8, chloride 117, CO2 18, BUN 31, calcium 6.4, total bilirubin 1.6, ammonia <9, hbg 6.1, platelet count  124, lactic acid 1.0, COVID-19 negative, and CXR revealed no acute abnormality.  CT Head negative for acute process. CT Abd Pelvis concerning for extensive acute colitis, most significant around the cecum with question a small foci of extraluminal air suggestive of perforation. Therefore, ER physician ordered azactam, 1 g of calcium gluconate, flagyl, vancomycin, 3L NS bolus, and 2 units pRBC's. ER physician contacted PCCM team for hospital admission.   PAST MEDICAL HISTORY :   has a past medical history of Alcohol abuse, Cirrhosis ([place]), COPD (chronic obstructive pulmonary disease) ([place]), Dementia ([place]), Hepatitis C, Malnutrition ([place]), and Throat cancer ([place]).  has a past surgical history that includes Tonsillectomy; Vasectomy; and IR IMAGING GUIDED PORT INSERTION (04/28/2019). Prior to Admission medications   Medication Sig Start Date End Date Taking? Authorizing Provider  alum & mag hydroxide-simeth (MAALOX/MYLANTA) 200-200-20 MG/5ML suspension Take 15 mLs by mouth as needed for indigestion or heartburn. 05/15/16  Yes Gouru, Illene Silver, MD  buPROPion (WELLBUTRIN SR) 150 MG 12 hr tablet bupropion HCl SR 150 mg tablet,12 hr sustained-release   Yes [provider]  docusate (COLACE) 50 MG/5ML liquid Take 100 mg by mouth daily.    Yes [provider]  ferrous sulfate 325 (65 FE) MG tablet Take 325 mg by mouth daily with breakfast.   Yes [provider]  folic acid (FOLVITE) 1 MG tablet Take 1 mg by mouth daily.   Yes [provider]  hydrOXYzine (ATARAX/VISTARIL) 25 MG tablet Take 100 mg by mouth at bedtime.  02/16/19  Yes [provider]  lactose free nutrition (BOOST PLUS) LIQD Take 237 mLs by mouth 4 (four) times daily.   Yes [provider]  levothyroxine (SYNTHROID) 100 MCG tablet Take 100 mcg by  mouth daily before breakfast.   Yes [provider]  lidocaine-prilocaine (EMLA) cream Apply to affected area once 05/02/19  Yes Finnegan, Kathlene November, MD  losartan (COZAAR) 25 MG tablet Take 25 mg by mouth daily. 03/20/19  Yes [provider]  mirtazapine (REMERON) 15 MG tablet Take 15 mg by mouth at bedtime. 03/20/19  Yes [provider]  mupirocin ointment (BACTROBAN) 2 % Apply 1 application topically 2 (two) times daily.   Yes [provider]  ondansetron (ZOFRAN) 8 MG tablet Take by mouth every 8 (eight) hours as needed for nausea or vomiting.   Yes [provider]  polyethylene glycol (MIRALAX / GLYCOLAX) 17 g packet Take 17 g by mouth daily.   Yes [provider]  prochlorperazine (COMPAZINE) 10 MG tablet Take 1 tablet (10 mg total) by mouth every 6 (six) hours as needed (Nausea or vomiting). 05/02/19  Yes Lloyd Huger, MD  QUEtiapine (SEROQUEL) 25 MG tablet Take 25 mg by mouth 3 (three) times daily.   Yes [provider]  QUEtiapine (SEROQUEL) 50 MG tablet Take 1 tablet (50 mg total) by mouth at bedtime. Patient taking differently: Take 50 mg by mouth at bedtime. Takes with a 25 mg tablet 05/26/16  Yes Mody, Ulice Bold, MD  tamsulosin (FLOMAX) 0.4 MG CAPS capsule Take 0.8 mg by mouth daily after breakfast.  10/14/15  Yes [provider]  thiamine 100 MG tablet Take 100 mg by mouth daily.   Yes [provider]  TRELEGY ELLIPTA 100-62.5-25 MCG/INH AEPB Inhale 1 puff into the lungs in the morning.  02/28/19  Yes [provider]  vitamin C (ASCORBIC ACID) 250 MG tablet Take 500 mg by mouth daily.    Yes [provider]  Vitamin D, Ergocalciferol, (DRISDOL) 1.25 MG (50000 UNIT) CAPS capsule Take 50,000 Units by mouth every 7 (seven) days.   Yes [provider]  Cholecalciferol (VITAMIN D3) 5000 units TABS Take 5,000 Units by mouth daily.    [provider]   Allergies  Allergen Reactions  . Penicillins Other (See Comments)    Reaction: unknown Patient is not able to answer follow up questions    FAMILY HISTORY:  family history  includes Breast cancer in his mother. SOCIAL HISTORY:  reports that he has been smoking cigarettes. He has a 31.50 pack-year smoking history. He has never used smokeless tobacco. He reports previous alcohol use of about 1.0 standard drinks of alcohol per week. He reports that he does not use drugs.  REVIEW OF SYSTEMS: Positives in BOLD  Gen: Denies fever, chills, weight change, fatigue, night sweats HEENT: Denies blurred vision, double vision, hearing loss, tinnitus, sinus congestion, rhinorrhea, sore throat, neck stiffness, dysphagia PULM: Denies shortness of breath, cough, sputum production, hemoptysis, wheezing CV: Denies chest pain, edema, orthopnea, paroxysmal nocturnal dyspnea, palpitations GI: Denies abdominal pain, nausea, vomiting, diarrhea, hematochezia, melena, constipation, change in bowel habits GU: Denies dysuria, hematuria, polyuria, oliguria, urethral discharge Endocrine: Denies hot or cold intolerance, polyuria, polyphagia or appetite change Derm: Denies rash, dry skin, scaling or peeling skin change Heme: Denies easy bruising, bleeding, bleeding gums Neuro: Denies headache, numbness, weakness, slurred speech, loss of memory or consciousness  SUBJECTIVE:  No complaints at this time   VITAL SIGNS: Temp:  [98 F (36.7 C)] 98 F (36.7 C) (05/11 0007) Pulse Rate:  [81-95] 95 (05/11 0130) Resp:  [10-19] 19 (05/11 0130) BP: (80-104)/(49-60) 104/60 (05/11 0130) SpO2:  [98 %-100 %] 98 % (05/11 0130) Weight:  [  61.5 kg] 61.5 kg (05/11 0013)  PHYSICAL EXAMINATION: General: frail elderly male resting in bed, NAD Neuro: alert, disoriented to situation, follows command, PERRLA HEENT: supple, mild JVD Cardiovascular: sinus tach, no R/G, right chest portacath   Lungs: rhonchi throughout, even, non labored  Abdomen: +BS x4, taut, slightly distended, non tender  Musculoskeletal: normal tone, no edema  Skin: intact no rashes or lesions present   Recent Labs  Lab 06/22/19 0900  06/26/19 2353  NA 137 139  K 4.1 2.8*  CL 108 117*  CO2 22 18*  BUN 13 31*  CREATININE 0.94 0.95  GLUCOSE 159* 90   Recent Labs  Lab 06/22/19 0900 06/26/19 2353  HGB 7.3* 6.1*  HCT 21.9* 17.8*  WBC 7.4 8.7  PLT 212 124*   CT Head Wo Contrast  Result Date: 06/27/2019 CLINICAL DATA:  Hypotension, altered level of consciousness, history of head and neck and lung cancer, cirrhosis, dementia EXAM: CT HEAD WITHOUT CONTRAST TECHNIQUE: Contiguous axial images were obtained from the base of the skull through the vertex without intravenous contrast. 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 No signs of acute infarct or hemorrhage. Lateral ventricles and midline structures are stable. No acute extra-axial fluid collections. Chronic dural thickening right frontal para falcine region, stable. No mass effect. Vascular: No hyperdense vessel or unexpected calcification. Skull: Normal. Negative for fracture or focal lesion. Sinuses/Orbits: Mild mucosal thickening throughout the ethmoid and maxillary sinuses. Other: None. IMPRESSION: 1. Chronic ischemic changes, no acute process. Electronically Signed   By: Randa Ngo M.D.   On: 06/27/2019 00:49   CT Abdomen Pelvis W Contrast  Result Date: 06/27/2019 CLINICAL DATA:  Abdominal distension, hypotensive EXAM: CT ABDOMEN AND PELVIS WITH CONTRAST TECHNIQUE: Multidetector CT imaging of the abdomen and pelvis was performed using the standard protocol following bolus administration of intravenous contrast. CONTRAST:  175mL OMNIPAQUE IOHEXOL 300 MG/ML  SOLN COMPARISON:  March 30, 2019 FINDINGS: Lower chest: The visualized heart size within normal limits. There is a trace pericardial effusion. A small right pleural effusion is present. Coarse calcifications are seen along both lung bases. Hepatobiliary: There is a shrunken nodular contour to the liver parenchyma.The  main portal vein is patent. Layering calcified gallstones are present. Pancreas: Unremarkable. No pancreatic ductal dilatation or surrounding inflammatory changes. Spleen: Normal in size without focal abnormality. Adrenals/Urinary Tract: Both adrenal glands appear normal. There is a small 1 cm low-density lesion seen within the upper pole of the right kidney. The left kidney is unremarkable. Bladder is unremarkable. Stomach/Bowel: The stomach and small bowel are grossly unremarkable. There is significant wall thickening and surrounding fat stranding changes seen predominantly around the right colon and cecum as well as the transverse colon and proximal descending colon. There is bubbly foci air within the right upper quadrant, likely thought to be within the cecum, however on series 2, image 37 there are 2 foci of air which could be extraluminal. No definite loculated fluid collections are seen. There scattered colonic diverticulosis.The patient is status post appendectomy. Vascular/Lymphatic: There are no enlarged mesenteric, retroperitoneal, or pelvic lymph nodes. Scattered aortic atherosclerotic calcifications are seen without aneurysmal dilatation. Reproductive: The prostate is unremarkable. Other: Diffuse mild mesenteric edema is seen throughout. A small amount of deep pelvic ascites is present. Musculoskeletal: No acute or significant osseous findings. IMPRESSION: 1. Findings consistent with extensive acute colitis involving the right colon, cecum, transverse colon and proximal descending colon. This is most significant  around the cecum with question a small foci of extraluminal air suggestive of perforation as described above. No definite loculated fluid collections are seen. 2. Small amount of deep pelvic ascites. 3. Small right pleural effusion and trace pericardial effusion 4. Findings suggestive of cirrhosis 5. Cholelithiasis 6.  Aortic Atherosclerosis (ICD10-I70.0). 7. These results were called by  telephone at the time of interpretation on 06/27/2019 at 1:02 am to provider JADE SUNG , who verbally acknowledged these results. Electronically Signed   By: Prudencio Pair M.D.   On: 06/27/2019 01:03   DG Chest Port 1 View  Result Date: 06/27/2019 CLINICAL DATA:  Confusion EXAM: PORTABLE CHEST 1 VIEW COMPARISON:  April 12, 2019 FINDINGS: The heart size and mediastinal contours are within normal limits. Aortic knob calcifications are seen. Again noted is a spiculated nodular opacity within the right upper lung. There is prominence of the central pulmonary vasculature. There is mildly increased interstitial markings seen throughout both lungs as on the prior exam. No pleural effusion is seen. No acute osseous abnormality. A right-sided MediPort catheter seen with the tip at the superior cavoatrial junction. IMPRESSION: Spiculated nodular opacity at the right upper lung, consistent with the patient's known pulmonary mass Mildly increased interstitial markings throughout both lungs as on prior exam which may be due to chronic lung changes Electronically Signed   By: Prudencio Pair M.D.   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 completed treatment in 2018 and Current Everyday Smoker  Oncology consulted appreciate input   Anemia and thrombocytopenia without obvious acute blood loss likely secondary to chemotherapy treatment  Transfuse 2 units pRBC  Trend CBC  Monitor for s/sx of bleeding and transfuse for hgb <7 VTE px: SCD's, avoid chemical prophylaxis   Extensive colitis  Hx: Hepatitis C,  Cirrhosis, and Malnutrition  Trend WBC and monitor fever curve Trend PCT  Follow cultures  Continue metronidazole  Will keep NPO for now  SUP: iv pepcid   Acute toxic metabolic encephalopathy secondary to infectious process and electrolyte imbalances complicated by dementia   Frequent reorientation  Correct electrolytes   Pt is a ward of the state his Legal Guardian listed in Epic is Kalman Drape from [place], [place] Pager [phone] (please enter 7 digits) PCCM Consult Pager [phone] (please enter 7 digits)

## 2019-06-27 NOTE — ED Notes (Signed)
Admitting MD, Dr. Mortimer Fries, stating pt can try CLD diet but to watch for N/V. Pt provided water and apple sauce at this time

## 2019-06-27 NOTE — Progress Notes (Signed)
PHARMACY -  BRIEF ANTIBIOTIC NOTE   Pharmacy has received consult(s) for Aztreonam and Vancomycin from an ED provider.  The patient's profile has been reviewed for ht/wt/allergies/indication/available labs.    One time order(s) placed for Aztreonam 2gm and Vancomycin 1gm  Further antibiotics/pharmacy consults should be ordered by admitting physician if indicated.                       Thank you, Hart Robinsons A 06/27/2019  1:03 AM

## 2019-06-27 NOTE — ED Notes (Signed)
Pt completed 1st unit of blood. Blood bank informed this RN pt did not meet criteria for 2nd unit of blood. This RN noticed pt also only received 2 out of 4 runs of potassium. MD notified so a repeat CMP could be done for K+ level and CBC for H/H level.

## 2019-06-27 NOTE — ED Notes (Signed)
Pt reoriented to surroundings. Pt assisted with urinal. Pt voided 125 mL. Pt hooked back up to the monitor.

## 2019-06-27 NOTE — Progress Notes (Signed)
74 y/o M w/ hx of stage IV lung cancer who became hypotension after chemo. Improved w/ IVFs evidently. Oncology was consulted. Pt is a ward of the state. Pt is DNR/DNI. Hospitalist will take over tomorrow.

## 2019-06-27 NOTE — ED Notes (Signed)
Pt repeat K+ 4.3 and HGB 7.9. MD notified. MD gave this RN verbal consent to d/c remaining 2 bags of K+ and 2nd bag of blood.

## 2019-06-27 NOTE — ED Triage Notes (Signed)
Pt arrives [facility] from [facility] w cc of AMS. Pt is hypotensive with EMS 80/60, afebrile.  BP 94/52 HR 86 SPO2 99% room air BS 156 98 oral

## 2019-06-27 NOTE — ED Notes (Signed)
Pt transported to CT ?

## 2019-06-27 NOTE — ED Notes (Signed)
Pt ambulatory to restroom with steady gait.

## 2019-06-27 NOTE — ED Notes (Signed)
Dr. Mortimer Fries notified of repeat hgb level.

## 2019-06-27 NOTE — ED Notes (Signed)
2 sets of blood cultures collected by this RN (1st set from the left [facility]; 2nd set from the right [facility]).

## 2019-06-27 NOTE — ED Notes (Signed)
Pt repeatedly calling out stating he is hungry. Pt still currently NPO due to colon infection and unable to tolerate PO. Admitting MD made aware.

## 2019-06-27 NOTE — Progress Notes (Signed)
PHARMACY CONSULT NOTE - FOLLOW UP  Pharmacy Consult for Electrolyte Monitoring and Replacement   Recent Labs: Potassium (mmol/L)  Date Value  06/26/2019 2.8 (L)  09/27/2012 3.7   Magnesium (mg/dL)  Date Value  09/27/2012 1.4 (L)   Calcium (mg/dL)  Date Value  06/26/2019 6.4 (LL)   Calcium, Total (mg/dL)  Date Value  09/26/2012 8.6   Albumin (g/dL)  Date Value  06/26/2019 1.7 (L)  09/23/2012 2.1 (L)   Sodium (mmol/L)  Date Value  06/26/2019 139  09/26/2012 137    Assessment: K+ 2.8, Calcium 6.4, albumin 1.7  Goal of Therapy:  Maintain lytes WNL  Plan:  Calcium 1gm ordered by ED provider Will order KCL 79meq IVPB x 4 bags total (15meq total) and recheck lytes in am (already ordered)  Ena Dawley ,PharmD Clinical Pharmacist 06/27/2019 2:21 AM

## 2019-06-27 NOTE — Progress Notes (Signed)
CODE SEPSIS - PHARMACY COMMUNICATION  **Broad Spectrum Antibiotics should be administered within 1 hour of Sepsis diagnosis**  Time Code Sepsis Called/Page Received: 0105  Antibiotics Ordered: Aztreonam and Vancomycin  Time of 1st antibiotic administration: 0127  Additional action taken by pharmacy: n/a  If necessary, Name of Provider/Nurse Contacted: n/a    Ena Dawley ,PharmD Clinical Pharmacist  06/27/2019  1:05 AM

## 2019-06-28 DIAGNOSIS — A419 Sepsis, unspecified organism: Principal | ICD-10-CM

## 2019-06-28 DIAGNOSIS — C3491 Malignant neoplasm of unspecified part of right bronchus or lung: Secondary | ICD-10-CM

## 2019-06-28 DIAGNOSIS — R6521 Severe sepsis with septic shock: Secondary | ICD-10-CM

## 2019-06-28 DIAGNOSIS — G9341 Metabolic encephalopathy: Secondary | ICD-10-CM

## 2019-06-28 LAB — CBC WITH DIFFERENTIAL/PLATELET
Abs Immature Granulocytes: 0.92 10*3/uL — ABNORMAL HIGH (ref 0.00–0.07)
Basophils Absolute: 0 10*3/uL (ref 0.0–0.1)
Basophils Relative: 0 %
Eosinophils Absolute: 0 10*3/uL (ref 0.0–0.5)
Eosinophils Relative: 0 %
HCT: 22.6 % — ABNORMAL LOW (ref 39.0–52.0)
Hemoglobin: 7.7 g/dL — ABNORMAL LOW (ref 13.0–17.0)
Immature Granulocytes: 11 %
Lymphocytes Relative: 5 %
Lymphs Abs: 0.4 10*3/uL — ABNORMAL LOW (ref 0.7–4.0)
MCH: 32.8 pg (ref 26.0–34.0)
MCHC: 34.1 g/dL (ref 30.0–36.0)
MCV: 96.2 fL (ref 80.0–100.0)
Monocytes Absolute: 0.6 10*3/uL (ref 0.1–1.0)
Monocytes Relative: 8 %
Neutro Abs: 6.1 10*3/uL (ref 1.7–7.7)
Neutrophils Relative %: 76 %
Platelets: 120 10*3/uL — ABNORMAL LOW (ref 150–400)
RBC: 2.35 MIL/uL — ABNORMAL LOW (ref 4.22–5.81)
RDW: 22.6 % — ABNORMAL HIGH (ref 11.5–15.5)
Smear Review: NORMAL
WBC: 8 10*3/uL (ref 4.0–10.5)
nRBC: 0 % (ref 0.0–0.2)

## 2019-06-28 LAB — CBC
HCT: 21.8 % — ABNORMAL LOW (ref 39.0–52.0)
Hemoglobin: 7.4 g/dL — ABNORMAL LOW (ref 13.0–17.0)
MCH: 32.6 pg (ref 26.0–34.0)
MCHC: 33.9 g/dL (ref 30.0–36.0)
MCV: 96 fL (ref 80.0–100.0)
Platelets: 124 10*3/uL — ABNORMAL LOW (ref 150–400)
RBC: 2.27 MIL/uL — ABNORMAL LOW (ref 4.22–5.81)
RDW: 22.6 % — ABNORMAL HIGH (ref 11.5–15.5)
WBC: 7.1 10*3/uL (ref 4.0–10.5)
nRBC: 0 % (ref 0.0–0.2)

## 2019-06-28 LAB — BASIC METABOLIC PANEL
Anion gap: 3 — ABNORMAL LOW (ref 5–15)
BUN: 29 mg/dL — ABNORMAL HIGH (ref 8–23)
CO2: 22 mmol/L (ref 22–32)
Calcium: 8.1 mg/dL — ABNORMAL LOW (ref 8.9–10.3)
Chloride: 108 mmol/L (ref 98–111)
Creatinine, Ser: 0.99 mg/dL (ref 0.61–1.24)
GFR calc Af Amer: >60 mL/min (ref >60)
GFR calc non Af Amer: >60 mL/min (ref >60)
Glucose, Bld: 111 mg/dL — ABNORMAL HIGH (ref 70–99)
Potassium: 3.7 mmol/L (ref 3.5–5.1)
Sodium: 133 mmol/L — ABNORMAL LOW (ref 135–145)

## 2019-06-28 LAB — MAGNESIUM: Magnesium: 1.9 mg/dL (ref 1.7–2.4)

## 2019-06-28 LAB — URINE CULTURE: Culture: <10000 — AB

## 2019-06-28 LAB — PHOSPHORUS: Phosphorus: 2.7 mg/dL (ref 2.5–4.6)

## 2019-06-28 MED ORDER — METHYLPREDNISOLONE 4 MG PO TBPK
4.0000 mg | ORAL_TABLET | Freq: Four times a day (QID) | ORAL | Status: DC
  Administered 2019-06-30 (×2): 4 mg via ORAL

## 2019-06-28 MED ORDER — METHYLPREDNISOLONE 4 MG PO TBPK
4.0000 mg | ORAL_TABLET | Freq: Three times a day (TID) | ORAL | Status: AC
  Administered 2019-06-29 (×3): 4 mg via ORAL

## 2019-06-28 MED ORDER — METHYLPREDNISOLONE 4 MG PO TBPK
8.0000 mg | ORAL_TABLET | Freq: Every evening | ORAL | Status: AC
  Administered 2019-06-28: 8 mg via ORAL
  Filled 2019-06-28: qty 21

## 2019-06-28 MED ORDER — SODIUM CHLORIDE 0.9 % IV SOLN
2.0000 g | INTRAVENOUS | Status: DC
  Administered 2019-06-28 – 2019-06-29 (×2): 2 g via INTRAVENOUS
  Filled 2019-06-28: qty 2
  Filled 2019-06-28: qty 20
  Filled 2019-06-28: qty 2

## 2019-06-28 MED ORDER — QUETIAPINE FUMARATE 25 MG PO TABS
50.0000 mg | ORAL_TABLET | Freq: Every day | ORAL | Status: DC
  Administered 2019-06-28 – 2019-06-29 (×2): 50 mg via ORAL
  Filled 2019-06-28 (×2): qty 2

## 2019-06-28 MED ORDER — CHLORHEXIDINE GLUCONATE CLOTH 2 % EX PADS
6.0000 | MEDICATED_PAD | Freq: Every day | CUTANEOUS | Status: DC
  Administered 2019-06-28: 6 via TOPICAL

## 2019-06-28 MED ORDER — METHYLPREDNISOLONE 4 MG PO TBPK
8.0000 mg | ORAL_TABLET | Freq: Every evening | ORAL | Status: AC
  Administered 2019-06-29: 8 mg via ORAL

## 2019-06-28 MED ORDER — LEVOTHYROXINE SODIUM 100 MCG PO TABS
100.0000 ug | ORAL_TABLET | Freq: Every day | ORAL | Status: DC
  Administered 2019-06-28 – 2019-06-30 (×3): 100 ug via ORAL
  Filled 2019-06-28 (×3): qty 1

## 2019-06-28 MED ORDER — QUETIAPINE FUMARATE 25 MG PO TABS
25.0000 mg | ORAL_TABLET | Freq: Three times a day (TID) | ORAL | Status: DC
  Administered 2019-06-28 – 2019-06-30 (×6): 25 mg via ORAL
  Filled 2019-06-28 (×6): qty 1

## 2019-06-28 MED ORDER — METHYLPREDNISOLONE 4 MG PO TBPK
8.0000 mg | ORAL_TABLET | Freq: Once | ORAL | Status: AC
  Administered 2019-06-28: 8 mg via ORAL
  Filled 2019-06-28: qty 21

## 2019-06-28 MED ORDER — MIRTAZAPINE 15 MG PO TABS
15.0000 mg | ORAL_TABLET | Freq: Every day | ORAL | Status: DC
  Administered 2019-06-28 – 2019-06-29 (×2): 15 mg via ORAL
  Filled 2019-06-28 (×2): qty 1

## 2019-06-28 MED ORDER — LOSARTAN POTASSIUM 25 MG PO TABS
25.0000 mg | ORAL_TABLET | Freq: Every day | ORAL | Status: DC
  Administered 2019-06-28 – 2019-06-30 (×3): 25 mg via ORAL
  Filled 2019-06-28 (×3): qty 1

## 2019-06-28 MED ORDER — FERROUS SULFATE 325 (65 FE) MG PO TABS
325.0000 mg | ORAL_TABLET | Freq: Every day | ORAL | Status: DC
  Administered 2019-06-29 – 2019-06-30 (×2): 325 mg via ORAL
  Filled 2019-06-28 (×2): qty 1

## 2019-06-28 MED ORDER — TAMSULOSIN HCL 0.4 MG PO CAPS
0.8000 mg | ORAL_CAPSULE | Freq: Every day | ORAL | Status: DC
  Administered 2019-06-29 – 2019-06-30 (×2): 0.8 mg via ORAL
  Filled 2019-06-28 (×2): qty 2

## 2019-06-28 MED ORDER — METHYLPREDNISOLONE 4 MG PO TBPK
4.0000 mg | ORAL_TABLET | Freq: Once | ORAL | Status: AC
  Administered 2019-06-28: 4 mg via ORAL

## 2019-06-28 MED ORDER — METHYLPREDNISOLONE 4 MG PO TBPK
4.0000 mg | ORAL_TABLET | ORAL | Status: AC
  Administered 2019-06-28: 4 mg via ORAL

## 2019-06-28 MED ORDER — BUPROPION HCL ER (SR) 150 MG PO TB12
150.0000 mg | ORAL_TABLET | Freq: Every day | ORAL | Status: DC
  Administered 2019-06-28 – 2019-06-30 (×3): 150 mg via ORAL
  Filled 2019-06-28 (×3): qty 1

## 2019-06-28 MED ORDER — ENOXAPARIN SODIUM 40 MG/0.4ML ~~LOC~~ SOLN: 40.0000 mg | SUBCUTANEOUS | Status: DC

## 2019-06-28 MED ORDER — SODIUM CHLORIDE 0.9 % IV SOLN
INTRAVENOUS | Status: DC | PRN
  Administered 2019-06-28: 250 mL via INTRAVENOUS
  Administered 2019-06-29: 500 mL via INTRAVENOUS
  Administered 2019-06-29: 250 mL via INTRAVENOUS

## 2019-06-28 NOTE — ED Notes (Signed)
Pt back from the restroom calling out asking for water. Pt informed that he is currently NPO and that I would reach out to admitting to see if he can drink water. Pt monitoring re-applied

## 2019-06-28 NOTE — Progress Notes (Signed)
PROGRESS NOTE    [name]  EHM:[id number] DOB: 1945/09/08 DOA: 06/26/2019 PCP: John Haggard, FNP      Brief Narrative:  Mr. [name] is a 74 y.o. 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    In the ER, found to have hypotension, abdominal pain.  CT abdomen pelvis showed extensive acute colitis in the right colon, cecum, transverse colon and proximal descending colon.  She was started on broad-spectrum antibiotics, IV fluids, and blood transfusion was ordered and the patient was admitted to the ICU.        Assessment & Plan:  Septic shock due to colitis Colitis with microperforation Patient presented with hypotension requiring ICU admission, altered mental status cardiac, and fever.  Suspected source colitis. 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  Treatment for this would be steroid taper -Start Medrol Dosepak -Follow-up with oncology  Smoker COPD without exacerbation Smoking cessation recommended -Continue ICS/LABA/LAMA  Anemia and thrombocytopenia secondary to chemotherapy Transfused 2 units on admission, hemoglobin subsequently stable, slight trend downward.  No clinical bleeding. -Resume iron  Hepatitis C cirrhosis Compensated  Severe protein calorie malnutrition -Consult nutrition  Hypothyroidism -Resume levothyroxine  Hypertension BP elevated -Resume losartan  BPH  -Continue Flomax  Acute metabolic encephalopathy due to  sepsis superimposed on dementia Encephalopathy is resolved to baseline.  At baseline the patient lives in a group [name], he has dementia.         Disposition: Status is: Inpatient  Remains inpatient appropriate because:patient has advanced age, metastatic cancer, multiple chronic organ failure, and microperforation on CT   Dispo: The patient is from: [name]              Anticipated d/c is to: [name]              Anticipated d/c date is: 2 days              Patient currently is not medically stable to d/c.    Patient's abdominal exam is benign.  He will need ongoing IV antibiotics for his colitis with microperforation, supportive cares, and advancing the diet slowly.  If his serial abdominal exams are normal, he is able to advance diet well, we could potentially discharge as early as tomorrow or Friday, however if he deteriorates, his abdominal exam changes, will consult general surgery.            MDM: The below labs and imaging reports were reviewed and summarized above.  Medication management as above.  Severe illness with threat to life bodily function.      DVT prophylaxis: SCDs Code Status: DO NOT RESUSCITATE Family Communication:     Consultants:   Radical care  Procedures:     Antimicrobials:   Flagyl 5/11>>  Ceftriaxone 5/12>>  Culture data:   5/11 urine culture insignificant growth  5/11 blood culture-no growth to date  5/12 stool studies pending          Subjective: Patient has no complaints.  Abdomen benign.  No diarrhea or vomiting.  No chest pain,  palpitations, dyspnea.  He had fever yesterday afternoon.  He is mentating at baseline.  Objective: Vitals:   06/28/19 0603 06/28/19 0618 06/28/19 0727 06/28/19 1137  BP: 124/79 (!) 154/73 (!) 142/74 (!) 141/79  Pulse: 89 (!) 102 89 (!) 102  Resp: 14  16 17   Temp: 99.5 F (37.5 C) 98.7 F (37.1 C) 98.8 F (37.1 C) 98.3 F (36.8 C)  TempSrc: Oral Oral Oral   SpO2:  92% 93% 100% 99%  Weight:  61.5 kg    Height:  5\' 6"  (1.676 m)      Intake/Output Summary (Last 24 hours) at 06/28/2019 1350 Last data filed at 06/28/2019 1346 Gross per 24 hour  Intake 600 ml  Output 325 ml  Net 275 ml   Filed Weights   06/27/19 0013 06/28/19 0618  Weight: 61.5 kg 61.5 kg    Examination: General appearance: Thin, chronically ill-appearing adult male adult male, alert and in no obvious distress.  Interactive. HEENT: Anicteric, conjunctiva pink, lids and lashes normal. No nasal deformity, discharge, epistaxis.  Lips moist, edentulous, oropharynx moist, no oral lesions, hearing seems normal.   Skin: Warm and dry.  No jaundice.  No suspicious rashes or lesions. Cardiac: RRR, nl S1-S2, no murmurs appreciated.  Capillary refill is brisk.  JVP normal.  No LE edema.  Radial pulses 2+ and symmetric. Respiratory: Normal respiratory rate and rhythm.  CTAB without rales or wheezes. Abdomen: Abdomen soft.  No TTP or guarding. No ascites, distension, hepatosplenomegaly.   MSK: No deformities or effusions.  Diffuse loss of subcutaneous muscle mass and fat. Neuro: Awake and alert.  EOMI, moves all extremities with generalized weakness, symmetric strength. Speech fluent.    Psych: Sensorium intact and responding to questions, attention normal. Affect pleasant.  Judgment and insight appear severely impaired.    Data Reviewed: I have personally reviewed following labs and imaging studies:  CBC: Recent Labs  Lab 06/22/19 0900 06/26/19 2353 06/27/19 1535 06/28/19 0157 06/28/19 0635  WBC 7.4 8.7 9.7 8.0 7.1  NEUTROABS 4.8 7.0 7.4 6.1  --   HGB 7.3* 6.1* 7.9* 7.7* 7.4*  HCT 21.9* 17.8* 22.7* 22.6* 21.8*  MCV 100.0 99.4 93.8 96.2 96.0  PLT 212 124* 126* 120* 716*   Basic Metabolic Panel: Recent Labs  Lab 06/22/19 0900 06/26/19 2353 06/27/19 0431 06/27/19 1535 06/28/19 0635  NA 137 139 136 136 133*  K 4.1 2.8* 4.3 4.3 3.7  CL 108 117* 111 110 108  CO2 22 18* 22 21* 22   GLUCOSE 159* 90 103* 115* 111*  BUN 13 31* 33* 28* 29*  CREATININE 0.94 0.95 1.08 1.05 0.99  CALCIUM 8.2* 6.4* 8.0* 8.2* 8.1*  MG  --   --  1.4*  --  1.9  PHOS  --   --  2.5  --  2.7   GFR: Estimated Creatinine Clearance: 55.2 mL/min (by C-G formula based on SCr of 0.99 mg/dL). Liver Function Tests: Recent Labs  Lab 06/22/19 0900 06/26/19 2353 06/27/19 0431 06/27/19 1535  AST 54* 37 45* 48*  ALT 37 35 43 42  ALKPHOS 99 48 63 67  BILITOT 0.9 1.6* 2.1* 2.5*  PROT 6.1* 4.4* 5.7* 6.1*  ALBUMIN 2.3* 1.7* 2.1* 2.3*   Recent Labs  Lab 06/26/19 2353  LIPASE 19   Recent Labs  Lab 06/27/19 0016  AMMONIA <9*   Coagulation Profile: Recent Labs  Lab 06/26/19 2353  INR 1.7*   Cardiac Enzymes: No results for input(s): CKTOTAL, CKMB, CKMBINDEX, TROPONINI  in the last 168 hours. BNP (last 3 results) No results for input(s): PROBNP in the last 8760 hours. HbA1C: No results for input(s): HGBA1C in the last 72 hours. CBG: No results for input(s): GLUCAP in the last 168 hours. Lipid Profile: No results for input(s): CHOL, HDL, LDLCALC, TRIG, CHOLHDL, LDLDIRECT in the last 72 hours. Thyroid Function Tests: No results for input(s): TSH, T4TOTAL, FREET4, T3FREE, THYROIDAB in the last 72 hours. Anemia Panel: No results for input(s): VITAMINB12, FOLATE, FERRITIN, TIBC, IRON, RETICCTPCT in the last 72 hours. Urine analysis:    Component Value Date/Time   COLORURINE YELLOW (A) 06/27/2019 0431   APPEARANCEUR CLEAR (A) 06/27/2019 0431   APPEARANCEUR Hazy 09/22/2012 1750   LABSPEC 1.042 (H) 06/27/2019 0431   LABSPEC 1.035 09/22/2012 1750   PHURINE 5.0 06/27/2019 0431   GLUCOSEU NEGATIVE 06/27/2019 0431   GLUCOSEU Negative 09/22/2012 1750   HGBUR NEGATIVE 06/27/2019 0431   BILIRUBINUR NEGATIVE 06/27/2019 0431   BILIRUBINUR 1+ 09/22/2012 1750   KETONESUR NEGATIVE 06/27/2019 0431   PROTEINUR NEGATIVE 06/27/2019 0431   NITRITE NEGATIVE 06/27/2019 0431   LEUKOCYTESUR NEGATIVE  06/27/2019 0431   LEUKOCYTESUR 1+ 09/22/2012 1750   Sepsis Labs: @LABRCNTIP (procalcitonin:4,lacticacidven:4)  ) Recent Results (from the past 240 hour(s))  SARS Coronavirus 2 by RT PCR (hospital order, performed in [place] hospital lab) Nasopharyngeal Nasopharyngeal Swab     Status: None   Collection Time: 06/27/19 12:16 AM   Specimen: Nasopharyngeal Swab  Result Value Ref Range Status   SARS Coronavirus 2 NEGATIVE NEGATIVE Final    Comment: (NOTE) SARS-CoV-2 target nucleic acids are NOT DETECTED. The SARS-CoV-2 RNA is generally detectable in upper and lower respiratory specimens during the acute phase of infection. The lowest concentration of SARS-CoV-2 viral copies this assay can detect is 250 copies / mL. A negative result does not preclude SARS-CoV-2 infection and should not be used as the sole basis for treatment or other patient management decisions.  A negative result may occur with improper specimen collection / handling, submission of specimen other than nasopharyngeal swab, presence of viral mutation(s) within the areas targeted by this assay, and inadequate number of viral copies (<250 copies / mL). A negative result must be combined with clinical observations, patient history, and epidemiological information. Fact Sheet for Patients:   StrictlyIdeas.no Fact Sheet for Healthcare Providers: BankingDealers.co.za This test is not yet approved or cleared  by the [place] FDA and has been authorized for detection and/or diagnosis of SARS-CoV-2 by FDA under an Emergency Use Authorization (EUA).  This EUA will remain in effect (meaning this test can be used) for the duration of the COVID-19 declaration under Section 564(b)(1) of the Act, 21 U.S.C. section 360bbb-3(b)(1), unless the authorization is terminated or revoked sooner. Performed at [facility], [address] [place], [place] [address]   Culture,  blood (routine x 2)     Status: None (Preliminary result)   Collection Time: 06/27/19 12:17 AM   Specimen: Left Antecubital; Blood  Result Value Ref Range Status   Specimen Description LEFT ANTECUBITAL  Final   Special Requests   Final    BOTTLES DRAWN AEROBIC AND ANAEROBIC Blood Culture results may not be optimal due to an excessive volume of blood received in culture bottles   Culture   Final    NO GROWTH 1 DAY Performed at [facility], [address], [place], [place] [address]    Report Status PENDING  Incomplete  Culture, blood (routine x 2)  Status: None (Preliminary result)   Collection Time: 06/27/19 12:17 AM   Specimen: Right Antecubital; Blood  Result Value Ref Range Status   Specimen Description RIGHT ANTECUBITAL  Final   Special Requests   Final    BOTTLES DRAWN AEROBIC AND ANAEROBIC Blood Culture results may not be optimal due to an excessive volume of blood received in culture bottles   Culture   Final    NO GROWTH 1 DAY Performed at [facility], [address], [place], [place] [address]    Report Status PENDING  Incomplete  Urine culture     Status: Abnormal   Collection Time: 06/27/19  4:31 AM   Specimen: Urine, Clean Catch  Result Value Ref Range Status   Specimen Description   Final    URINE, CLEAN CATCH Performed at [facility], [address], [place], [place] [address]    Special Requests   Final    NONE Performed at [facility], [address] [place], [place] [address]    Culture (A)  Final    <10,000 COLONIES/mL INSIGNIFICANT GROWTH Performed at [facility], [place] [address], [place], [place] [address]    Report Status 06/28/2019 FINAL  Final         Radiology Studies: CT Head Wo Contrast  Result Date: 06/27/2019 CLINICAL DATA:  Hypotension, altered level of consciousness, history of head and neck and lung cancer, cirrhosis, dementia EXAM: CT HEAD WITHOUT CONTRAST TECHNIQUE: Contiguous  axial images were obtained from the base of the skull through the vertex without intravenous contrast. 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 No signs of acute infarct or hemorrhage. Lateral ventricles and midline structures are stable. No acute extra-axial fluid collections. Chronic dural thickening right frontal para falcine region, stable. No mass effect. Vascular: No hyperdense vessel or unexpected calcification. Skull: Normal. Negative for fracture or focal lesion. Sinuses/Orbits: Mild mucosal thickening throughout the ethmoid and maxillary sinuses. Other: None. IMPRESSION: 1. Chronic ischemic changes, no acute process. Electronically Signed   By: Randa Ngo M.D.   On: 06/27/2019 00:49   CT Abdomen Pelvis W Contrast  Result Date: 06/27/2019 CLINICAL DATA:  Abdominal distension, hypotensive EXAM: CT ABDOMEN AND PELVIS WITH CONTRAST TECHNIQUE: Multidetector CT imaging of the abdomen and pelvis was performed using the standard protocol following bolus administration of intravenous contrast. CONTRAST:  132mL OMNIPAQUE IOHEXOL 300 MG/ML  SOLN COMPARISON:  March 30, 2019 FINDINGS: Lower chest: The visualized heart size within normal limits. There is a trace pericardial effusion. A small right pleural effusion is present. Coarse calcifications are seen along both lung bases. Hepatobiliary: There is a shrunken nodular contour to the liver parenchyma.The main portal vein is patent. Layering calcified gallstones are present. Pancreas: Unremarkable. No pancreatic ductal dilatation or surrounding inflammatory changes. Spleen: Normal in size without focal abnormality. Adrenals/Urinary Tract: Both adrenal glands appear normal. There is a small 1 cm low-density lesion seen within the upper pole of the right kidney. The left kidney is unremarkable. Bladder is unremarkable. Stomach/Bowel: The stomach and small bowel  are grossly unremarkable. There is significant wall thickening and surrounding fat stranding changes seen predominantly around the right colon and cecum as well as the transverse colon and proximal descending colon. There is bubbly foci air within the right upper quadrant, likely thought to be within the cecum, however on series 2, image 37 there are 2 foci of air which could be extraluminal. No  definite loculated fluid collections are seen. There scattered colonic diverticulosis.The patient is status post appendectomy. Vascular/Lymphatic: There are no enlarged mesenteric, retroperitoneal, or pelvic lymph nodes. Scattered aortic atherosclerotic calcifications are seen without aneurysmal dilatation. Reproductive: The prostate is unremarkable. Other: Diffuse mild mesenteric edema is seen throughout. A small amount of deep pelvic ascites is present. Musculoskeletal: No acute or significant osseous findings. IMPRESSION: 1. Findings consistent with extensive acute colitis involving the right colon, cecum, transverse colon and proximal descending colon. This is most significant around the cecum with question a small foci of extraluminal air suggestive of perforation as described above. No definite loculated fluid collections are seen. 2. Small amount of deep pelvic ascites. 3. Small right pleural effusion and trace pericardial effusion 4. Findings suggestive of cirrhosis 5. Cholelithiasis 6.  Aortic Atherosclerosis (ICD10-I70.0). 7. These results were called by telephone at the time of interpretation on 06/27/2019 at 1:02 am to provider JADE SUNG , who verbally acknowledged these results. Electronically Signed   By: Prudencio Pair M.D.   On: 06/27/2019 01:03   DG Chest Port 1 View  Result Date: 06/27/2019 CLINICAL DATA:  Confusion EXAM: PORTABLE CHEST 1 VIEW COMPARISON:  April 12, 2019 FINDINGS: The heart size and mediastinal contours are within normal limits. Aortic knob calcifications are seen. Again noted is a  spiculated nodular opacity within the right upper lung. There is prominence of the central pulmonary vasculature. There is mildly increased interstitial markings seen throughout both lungs as on the prior exam. No pleural effusion is seen. No acute osseous abnormality. A right-sided MediPort catheter seen with the tip at the superior cavoatrial junction. IMPRESSION: Spiculated nodular opacity at the right upper lung, consistent with the patient's known pulmonary mass Mildly increased interstitial markings throughout both lungs as on prior exam which may be due to chronic lung changes Electronically Signed   By: Prudencio Pair M.D.   On: 06/27/2019 00:10        Scheduled Meds: . enoxaparin (LOVENOX) injection  40 mg Subcutaneous Q24H  . fluticasone furoate-vilanterol  1 puff Inhalation Daily   And  . umeclidinium bromide  1 puff Inhalation Daily  . iohexol  1,000 mL Oral Once  . methylPREDNISolone  4 mg Oral PC lunch  . methylPREDNISolone  4 mg Oral PC supper  . [START ON 06/29/2019] methylPREDNISolone  4 mg Oral 3 x daily with food  . [START ON 06/30/2019] methylPREDNISolone  4 mg Oral 4X daily taper  . methylPREDNISolone  8 mg Oral AC breakfast  . methylPREDNISolone  8 mg Oral Nightly  . [START ON 06/29/2019] methylPREDNISolone  8 mg Oral Nightly   Continuous Infusions: . cefTRIAXone (ROCEPHIN)  IV    . famotidine (PEPCID) IV 20 mg (06/28/19 1009)  . metronidazole 500 mg (06/28/19 1348)     LOS: 1 day    Time spent: 35 minutes    Edwin Dada, MD [facility] 06/28/2019, 1:50 PM     Please page though [place] or Epic secure chat:  For [employer], Adult nurse

## 2019-06-28 NOTE — Progress Notes (Signed)
Palliative care referral received.  Case discussed with Dr. Grayland Ormond who requested patient be evaluated in the outpatient setting.  We will schedule a clinic visit when patient returns to the [facility].

## 2019-06-28 NOTE — TOC Initial Note (Signed)
Transition of Care [facility]) - Initial/Assessment Note    Patient Details  Name: [name] MRN: [id number] Date of Birth: 1946/02/02  Transition of Care [facility]) CM/SW Contact:    Victorino Dike, RN Phone Number: 06/28/2019, 12:59 PM  Clinical Narrative:                   Met with patient in room.  Patient is pleasant to talk to but also very inconsistent in his life story.  Spoke with his legal guardian Levander Campion with DSS.  She reports patient lives at [facility], Administrator is  Melida Quitter  [phone].  They agree to take patient back at discharge.   Resha in agreement with [place] will be provided by [place].  Expected Discharge Plan: Group Home(with Home health service) Barriers to Discharge: Continued Medical Work up   Patient Goals and CMS Choice   CMS Medicare.gov Compare Post Acute Care list provided to:: Legal Guardian Choice offered to / list presented to : [facility] POA / Guardian  Expected Discharge Plan and Services Expected Discharge Plan: Group Home(with Home health service)   Discharge Planning Services: CM Consult Post Acute Care Choice: [place] arrangements for the past 2 months: [place] Agency: [place] ([place]) Date [place]: 06/28/19 Time [place]: 63 Representative spoke with at [place]: Corene Cornea  Prior Living Arrangements/Services Living arrangements for the past 2 months: [place] with:: Facility Resident Patient language and need for interpreter reviewed:: Yes Do you feel safe going back to the place where you live?: Yes      Need for Family Participation in Patient Care: No (Comment) Care giver support system in place?: (S) Yes (comment)([place])   Criminal Activity/Legal Involvement Pertinent to Current Situation/Hospitalization: No - Comment as needed  Activities of Daily Living      Permission  Sought/Granted                  Emotional Assessment Appearance:: Appears stated age Attitude/Demeanor/Rapport: Inconsistent Affect (typically observed): Calm Orientation: : Oriented to Self, Oriented to Place, Oriented to Situation Alcohol / Substance Use: Not Applicable Psych Involvement: No (comment)  Admission diagnosis:  Hypocalcemia [E83.51] Colitis [K52.9] Hypotension [I95.9] Symptomatic anemia [D64.9] Hypotension, unspecified hypotension type [I95.9] Altered mental status, unspecified altered mental status type [R41.82] Sepsis, due to unspecified organism, unspecified whether acute organ dysfunction present [facility]) [A41.9] Patient Active Problem List   Diagnosis Date Noted  . Hypotension 06/27/2019  . Alcoholic hepatitis 32/54/9826  . Anxiety 04/20/2019  . Benign prostatic hyperplasia 04/20/2019  . HCV (hepatitis C virus) 04/20/2019  . History of cocaine use 04/20/2019  . History of fall 04/20/2019  . History of noncompliance with medical treatment 04/20/2019  . History of gastric ulcer 04/20/2019  . Jaw fracture ([place]) 04/20/2019  . Skin erosion 04/20/2019  . Tobacco abuse 04/20/2019  . Urinary retention 04/20/2019  . Adenocarcinoma, lung, right ([place]) 03/17/2019  . Near syncope   . Encounter for hospice care discussion   . Dehydration   . Renal insufficiency   . Protein-calorie malnutrition, severe 05/14/2016  . Throat cancer ([place])   . Palliative care encounter   . AKI (acute kidney injury) ([place]) 05/13/2016  . Goals of care, counseling/discussion 03/01/2016  . Primary cancer of  larynx ([place]) 02/19/2016  . Alcohol abuse 11/18/2015  . Dementia with behavioral disturbance ([place]) 10/21/2015   PCP:  Remi Haggard, FNP Pharmacy:   [facility], [place] [address] Phone: [phone] Fax: [phone]     Social Determinants of Health (SDOH) Interventions    Readmission Risk Interventions No flowsheet  data found.

## 2019-06-28 NOTE — Progress Notes (Signed)
PHARMACY CONSULT NOTE - FOLLOW UP  Pharmacy Consult for Electrolyte Monitoring and Replacement   Recent Labs: Potassium (mmol/L)  Date Value  06/28/2019 3.7  09/27/2012 3.7   Magnesium (mg/dL)  Date Value  06/28/2019 1.9  09/27/2012 1.4 (L)   Calcium (mg/dL)  Date Value  06/28/2019 8.1 (L)   Calcium, Total (mg/dL)  Date Value  09/26/2012 8.6   Albumin (g/dL)  Date Value  06/27/2019 2.3 (L)  09/23/2012 2.1 (L)   Phosphorus (mg/dL)  Date Value  06/28/2019 2.7   Sodium (mmol/L)  Date Value  06/28/2019 133 (L)  09/26/2012 137    Assessment: K 4.3, Mag 1.4, Calcium 8.6, albumin 2.3  Goal of Therapy:  Maintain lytes WNL  Plan:  No replacement needed at this time. Will follow up with AM labs.   Oswald Hillock ,PharmD, BCPS Clinical Pharmacist 06/28/2019 8:01 AM

## 2019-06-28 NOTE — Progress Notes (Addendum)
   06/28/19 0618  Vitals  Temp 98.7 F (37.1 C)  Temp Source Oral  BP (!) 154/73  MAP (mmHg) 97  BP Location Left Arm  BP Method Automatic  Patient Position (if appropriate) Lying  Pulse Rate (!) 102  Oxygen Therapy  SpO2 93 %  O2 Device Room Air  Height and Weight  Height 5\' 6"  (1.676 m)  Weight 61.5 kg  Type of Scale Used Standing  BSA (Calculated - sq m) 1.69 sq meters  BMI (Calculated) 21.9  Weight in (lb) to have BMI = 25 154.6  MEWS Score  MEWS Temp 0  MEWS Systolic 0  MEWS Pulse 1  MEWS RR 0  MEWS LOC 0  MEWS Score 1  MEWS Score Color Green   The patient is admitted to 40 with the diagnosis of a hypotension. Denied any pain No acute  distress noted. Patient oriented to his room,ascom and call bell and staff. Will continue to monitor.

## 2019-06-28 NOTE — Evaluation (Signed)
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    Clinical Impression  Pt pleasant and motivated to participate during the session.  Pt required extra time and effort with bed mobility and transfers but did not require physical assist.  Pt declined to use RW during amb and was able to amb a maximum of 125' before requiring to return to sitting with min-mod SOB, HR WNL and SpO2 >/= 91% throughout.  Pt presented with min instability during amb without an AD including mild drifting left/right but was able to self-correct with stability grossly improving as amb progressed.  Pt declined recommendation for HHPT and RW stating that he would use his walking stick while he is getting his strength back.  Will keep patient on PT caseload while in acute care to prevent further functional decline and to address deficits listed in patient problem list for decreased caregiver assistance and eventual return to PLOF.      Follow Up Recommendations Home health PT    Equipment Recommendations  None recommended by PT;Other (comment)(Pt declined offer to secure RW)    Recommendations for Other Services       Precautions / Restrictions Precautions Precautions: Fall Restrictions Weight Bearing Restrictions: No      Mobility  Bed Mobility Overal bed mobility: Modified Independent             General bed mobility comments: Extra time and effort needed  Transfers Overall transfer level: Needs assistance Equipment used: None Transfers: Sit to/from Stand Sit to Stand: Supervision         General transfer comment: Fair concentric and  eccentric control  Ambulation/Gait Ambulation/Gait assistance: Supervision Gait Distance (Feet): 125 Feet Assistive device: None Gait Pattern/deviations: Step-through pattern;Drifts right/left Gait velocity: decreased   General Gait Details: Pt with min-mod SOB with SpO2 and HR WNL after amb a maximum of 125'.  Pt with min instability with mild drifting left/right but was able to self-correct without assistance  Stairs            Wheelchair Mobility    Modified Rankin (Stroke Patients Only)       Balance Overall balance assessment: Mild deficits observed, not formally tested                                           Pertinent Vitals/Pain Pain Assessment: No/denies pain    Home Living Family/patient expects to be discharged to:: Group home                 Additional Comments: [facility] resident; level entry with 24/7 staff supervision available    Prior Function Level of Independence: Independent         Comments: Pt ambulates without an AD but does have a walking stick if needed; no fall history, Ind with ADLs, facility staff prepares meals     Hand Dominance        Extremity/Trunk Assessment   Upper Extremity Assessment Upper Extremity Assessment: Generalized weakness    Lower Extremity Assessment Lower Extremity Assessment: Generalized weakness  Communication   Communication: No difficulties  Cognition Arousal/Alertness: Awake/alert Behavior During Therapy: WFL for tasks assessed/performed Overall Cognitive Status: Within Functional Limits for tasks assessed                                        General Comments      Exercises Total Joint Exercises Ankle Circles/Pumps: Strengthening;Both;10 reps Quad Sets: Strengthening;Both;10 reps Gluteal Sets: Strengthening;Both;10 reps Hip ABduction/ADduction: AROM;Both;5 reps Straight Leg Raises: AROM;Both;5 reps Long Arc Quad:  Strengthening;Both;10 reps Other Exercises Other Exercises: HEP education and review for BLE APs, QS, GS, and LAQ x 10 each every 1-2 hours daily   Assessment/Plan    PT Assessment Patient needs continued PT services  PT Problem List Decreased strength;Decreased activity tolerance;Decreased balance;Decreased mobility       PT Treatment Interventions DME instruction;Gait training;Functional mobility training;Therapeutic activities;Therapeutic exercise;Balance training;Patient/family education    PT Goals (Current goals can be found in the Care Plan section)  Acute Rehab PT Goals Patient Stated Goal: To get back home PT Goal Formulation: With patient Time For Goal Achievement: 07/11/19 Potential to Achieve Goals: Good    Frequency Min 2X/week   Barriers to discharge        Co-evaluation               AM-PAC PT "6 Clicks" Mobility  Outcome Measure Help needed turning from your back to your side while in a flat bed without using bedrails?: A Little Help needed moving from lying on your back to sitting on the side of a flat bed without using bedrails?: A Little Help needed moving to and from a bed to a chair (including a wheelchair)?: A Little Help needed standing up from a chair using your arms (e.g., wheelchair or bedside chair)?: A Little Help needed to walk in hospital room?: A Little Help needed climbing 3-5 steps with a railing? : A Little 6 Click Score: 18    End of Session Equipment Utilized During Treatment: Gait belt Activity Tolerance: Patient tolerated treatment well Patient left: in chair;with chair alarm set;with call bell/phone within reach Nurse Communication: Mobility status PT Visit Diagnosis: Unsteadiness on feet (R26.81);Difficulty in walking, not elsewhere classified (R26.2);Muscle weakness (generalized) (M62.81)    Time: 4627-0350 PT Time Calculation (min) (ACUTE ONLY): 20 min   Charges:   PT Evaluation $PT Eval Moderate Complexity: 1 Mod PT  Treatments $Therapeutic Exercise: 8-22 mins        D. Royetta Asal PT, DPT 06/28/19, 9:49 AM

## 2019-06-29 ENCOUNTER — Ambulatory Visit: Payer: Medicare Other | Attending: Radiation Oncology | Admitting: Radiation Oncology

## 2019-06-29 LAB — BASIC METABOLIC PANEL
Anion gap: 4 — ABNORMAL LOW (ref 5–15)
BUN: 28 mg/dL — ABNORMAL HIGH (ref 8–23)
CO2: 21 mmol/L — ABNORMAL LOW (ref 22–32)
Calcium: 7.9 mg/dL — ABNORMAL LOW (ref 8.9–10.3)
Chloride: 109 mmol/L (ref 98–111)
Creatinine, Ser: 0.85 mg/dL (ref 0.61–1.24)
GFR calc Af Amer: >60 mL/min (ref >60)
GFR calc non Af Amer: >60 mL/min (ref >60)
Glucose, Bld: 138 mg/dL — ABNORMAL HIGH (ref 70–99)
Potassium: 4 mmol/L (ref 3.5–5.1)
Sodium: 134 mmol/L — ABNORMAL LOW (ref 135–145)

## 2019-06-29 LAB — GASTROINTESTINAL PANEL BY PCR, STOOL (REPLACES STOOL CULTURE)

## 2019-06-29 LAB — CBC
HCT: 19.1 % — ABNORMAL LOW (ref 39.0–52.0)
Hemoglobin: 6.8 g/dL — ABNORMAL LOW (ref 13.0–17.0)
MCH: 32.9 pg (ref 26.0–34.0)
MCHC: 35.6 g/dL (ref 30.0–36.0)
MCV: 92.3 fL (ref 80.0–100.0)
Platelets: 100 10*3/uL — ABNORMAL LOW (ref 150–400)
RBC: 2.07 MIL/uL — ABNORMAL LOW (ref 4.22–5.81)
RDW: 21.7 % — ABNORMAL HIGH (ref 11.5–15.5)
WBC: 4.3 10*3/uL (ref 4.0–10.5)
nRBC: 0 % (ref 0.0–0.2)

## 2019-06-29 LAB — PREPARE RBC (CROSSMATCH)

## 2019-06-29 MED ORDER — ADULT MULTIVITAMIN W/MINERALS CH
1.0000 | ORAL_TABLET | Freq: Every day | ORAL | Status: DC
  Administered 2019-06-30: 1 via ORAL
  Filled 2019-06-29: qty 1

## 2019-06-29 MED ORDER — BOOST / RESOURCE BREEZE PO LIQD CUSTOM
1.0000 | Freq: Three times a day (TID) | ORAL | Status: DC
  Administered 2019-06-29 (×2): 1 via ORAL

## 2019-06-29 MED ORDER — SODIUM CHLORIDE 0.9% IV SOLUTION: Freq: Once | INTRAVENOUS | Status: AC

## 2019-06-29 NOTE — Progress Notes (Signed)
Patient found standing at the side of the bed looking for urinal. Bed alarming. Patient appears to have had an incont. Stool, appears to be soft yet formed. Patient used the urinal at this point. Patient cleaned up and and back in bed. Sample taken to lab. Safety checks completed and call light placed within reach.

## 2019-06-29 NOTE — Progress Notes (Signed)
Initial Nutrition Assessment  DOCUMENTATION CODES:   Not applicable  INTERVENTION:   Boost Breeze po TID, each supplement provides 250 kcal and 9 grams of protein  MVI daily   NUTRITION DIAGNOSIS:   Increased nutrient needs related to cancer and cancer related treatments, other (see comment)(COPD, cirrhosis) as evidenced by increased estimated needs.  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  Unable to reach pt by phone. Suspect pt with decreased appetite and oral intake pta. Pt currently eating 100% of clear liquid diet. RD will add supplements and MVI to help pt meet his estimated needs. Per chart, pt appears fairly weight stable pta. RD will obtain nutrition related exam and history at follow-up. Pt at high risk for malnutrition.   Medications reviewed and include: ferrous sulfate, synthroid, medrol, remeron, ceftriaxone, pepcid, metronidazole   Labs reviewed: Na 134(L), BUN 28(H) Hgb 6.8(L), Hct 19.1(L)  NUTRITION - FOCUSED PHYSICAL EXAM: Unable to perform at this time   Diet Order:   Diet Order            Diet clear liquid Room service appropriate? Yes; Fluid consistency: Thin  Diet effective now             EDUCATION NEEDS:   Not appropriate for education at this time  Skin:  Skin Assessment: Reviewed RN Assessment(ecchymosis)  Last BM:  5/12- type  4  Height:   Ht Readings from Last 1 Encounters:  06/28/19 5\' 6"  (1.676 m)    Weight:   Wt Readings from Last 1 Encounters:  06/29/19 62.1 kg    Ideal Body Weight:  64.5 kg  BMI:  Body mass index is 22.11 kg/m.  Estimated Nutritional Needs:   Kcal:  1800-2100kcal/day  Protein:  95-105g/day  Fluid:  >1.6L/day  Koleen Distance MS, RD, LDN Please refer to [facility] for RD and/or RD on-call/weekend/after hours pager

## 2019-06-29 NOTE — Progress Notes (Signed)
PHARMACY CONSULT NOTE - FOLLOW UP  Pharmacy Consult for Electrolyte Monitoring and Replacement   Recent Labs: Potassium (mmol/L)  Date Value  06/29/2019 4.0  09/27/2012 3.7   Magnesium (mg/dL)  Date Value  06/28/2019 1.9  09/27/2012 1.4 (L)   Calcium (mg/dL)  Date Value  06/29/2019 7.9 (L)   Calcium, Total (mg/dL)  Date Value  09/26/2012 8.6   Albumin (g/dL)  Date Value  06/27/2019 2.3 (L)  09/23/2012 2.1 (L)   Phosphorus (mg/dL)  Date Value  06/28/2019 2.7   Sodium (mmol/L)  Date Value  06/29/2019 134 (L)  09/26/2012 137    Assessment: 74 y.o. 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  Goal of Therapy:  Maintain electolytes WNL  Plan:  No replacement needed at this time. Will follow up with AM labs.   Oswald Hillock ,PharmD, BCPS Clinical Pharmacist 06/29/2019 8:12 AM

## 2019-06-29 NOTE — Progress Notes (Signed)
PROGRESS NOTE    [name]  NOM:[id number] DOB: August 20, 1945 DOA: 06/26/2019 PCP: John Haggard, FNP      Brief Narrative:  Mr. [name] is a 74 y.o. 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    In the ER, found to have hypotension, abdominal pain.  CT abdomen pelvis showed extensive acute colitis in the right colon, cecum, transverse colon and proximal descending colon.  She was started on broad-spectrum antibiotics, IV fluids, and blood transfusion was ordered and the patient was admitted to the ICU.        Assessment & Plan:  Septic shock due to colitis Colitis with microperforation Patient presented with hypotension requiring ICU admission, altered mental status cardiac, and fever.  Suspected source colitis.  CT abdomen at admission showed microperforation, but without acute abdomen.   Fever 100.54F last night again, abdomen soft, mild LLQ tenderness. -Clears and bowel rest, advance diet as tolerated -Continue CTX and Flagyl -Serial abdominal exams -Follow stool studies    Stage IVb lung adenocarcinoma right upper lobe with bony metastasis on palliative Keytruda, carboplatin History stage IV laryngeal cancer Beryle Flock is able to cause medication related colitis.  Discussed this with Oncology.  Hard to distinguish clinically from infectious colitis, and treatment would be with steroid taper and outpatietn follow up.  -Continue Medrol Dosepak -Follow-up with oncology  Smoker COPD without exacerbation Smoking cessation recommended -Continue ICS/LABA/LAMA  Anemia and thrombocytopenia secondary to chemotherapy Transfused 2 units on admission, hemoglobin subsequently stable, slight trend downward.  Still no  clinical bleeding.  Hgb 6.8 today -Continue iron -Transfuse 1 unit -Transfusion threshold 7 g/dL  Hepatitis C cirrhosis Compensated  Severe protein calorie malnutrition -Consult nutrition  Hypothyroidism -Continue levothyroxine  Hypertension BP normal -Continue losartan  BPH  -Continue Flomax  Acute metabolic encephalopathy due to sepsis superimposed on dementia Encephalopathy is resolved to baseline.  At baseline the patient lives in a group home, he has dementia.         Disposition: Status is: Inpatient  Remains inpatient appropriate because:patient has advanced age, metastatic cancer, multiple chronic organ failure, and microperforation on CT   Dispo: The patient is from: Group home              Anticipated d/c is to: [facility] home              Anticipated d/c date is: 2 days              Patient currently is not medically stable to d/c.    Patient's abdominal exam remains benign, however he had fever again, so we will delay advancing his diet, and continue IV antibiotics and supportive cares and serial abdominal exams  To be clear, this patient is quite ill with metastatic cancer, now severe colitis, and he has extremely high risk to deteriorate and die within the next couple of weeks        MDM: The below labs and imaging reports reviewed and summarized above.  Medication management as above.         DVT prophylaxis: SCDs Code Status: DO NOT RESUSCITATE Family Communication:     Consultants:   Palliative care  Procedures:     Antimicrobials:   Flagyl 5/11>>  Ceftriaxone 5/12>>  Culture data:  5/11 urine culture insignificant growth  5/11 blood culture-no growth to date  5/12 stool studies pending          Subjective: Patient states he is "fine".  Abdomen soft, no diarrhea, stool is soft.  No chest pain, palpitations, dyspnea.  Fever again yesterday.  Mentating seems close to baseline.         Objective: Vitals:   06/29/19 0415 06/29/19 0808 06/29/19 0944 06/29/19 1018  BP: (!) 117/59 119/63 129/66 133/69  Pulse: 68 66 71 72  Resp:  18 20 20   Temp: 97.8 F (36.6 C) (!) 97.5 F (36.4 C) 97.8 F (36.6 C) 97.7 F (36.5 C)  TempSrc: Oral Oral Oral Oral  SpO2: 98% 97% 97% 100%  Weight: 62.1 kg     Height:        Intake/Output Summary (Last 24 hours) at 06/29/2019 1320 Last data filed at 06/29/2019 1000 Gross per 24 hour  Intake 916.77 ml  Output 425 ml  Net 491.77 ml   Filed Weights   06/27/19 0013 06/28/19 0618 06/29/19 0415  Weight: 61.5 kg 61.5 kg 62.1 kg    Examination: General appearance: Thin, chronically ill-appearing adult male, lying in bed, sleeping, opens eyes and is interactive, then goes back to sleep.     HEENT: Anicteric, conjunctival pink, lids and lashes normal.  No nasal deformity, discharge, or epistaxis.  Lips normal, edentulous, oropharynx moist, no oral lesions, hearing normal Skin: Skin warm and dry, no suspicious rashes or lesions Cardiac: Regular rate and rhythm, no murmurs appreciated, JVP normal, no lower extremity edema Respiratory: Normal respiratory rate and rhythm, lungs clear without rales or wheezes, lung sounds diminished, shallow respiratory effort Abdomen: Abdomen soft, no guarding.  He has tenderness in the left lower quadrant, but no rigidity or rebound.  No ascites or distention. MSK: Diffuse loss of subcutaneous muscle mass and fat. Neuro: Sleepy but easily arousable.  Moves all extremities with generalized weakness, symmetric strength, speech fluent. Psych: Sensorium intact responding to questions, oriented to self only.  This is baseline.  Attention seems normal, affect pleasant, judgment and insight appear chronically stable impaired.     Data Reviewed: I have personally reviewed following labs and imaging studies:  CBC: Recent Labs  Lab 06/26/19 2353 06/27/19 1535 06/28/19 0157 06/28/19 0635 06/29/19 0610   WBC 8.7 9.7 8.0 7.1 4.3  NEUTROABS 7.0 7.4 6.1  --   --   HGB 6.1* 7.9* 7.7* 7.4* 6.8*  HCT 17.8* 22.7* 22.6* 21.8* 19.1*  MCV 99.4 93.8 96.2 96.0 92.3  PLT 124* 126* 120* 124* 638*   Basic Metabolic Panel: Recent Labs  Lab 06/26/19 2353 06/27/19 0431 06/27/19 1535 06/28/19 0635 06/29/19 0610  NA 139 136 136 133* 134*  K 2.8* 4.3 4.3 3.7 4.0  CL 117* 111 110 108 109  CO2 18* 22 21* 22 21*  GLUCOSE 90 103* 115* 111* 138*  BUN 31* 33* 28* 29* 28*  CREATININE 0.95 1.08 1.05 0.99 0.85  CALCIUM 6.4* 8.0* 8.2* 8.1* 7.9*  MG  --  1.4*  --  1.9  --   PHOS  --  2.5  --  2.7  --    GFR: Estimated Creatinine Clearance: 64.9 mL/min (by C-G formula based on SCr of 0.85 mg/dL). Liver Function Tests: Recent Labs  Lab 06/26/19 2353 06/27/19 0431 06/27/19 1535  AST 37 45* 48*  ALT 35 43 42  ALKPHOS 48 63 67  BILITOT 1.6* 2.1* 2.5*  PROT 4.4* 5.7* 6.1*  ALBUMIN 1.7* 2.1* 2.3*   Recent Labs  Lab 06/26/19 2353  LIPASE 19   Recent Labs  Lab 06/27/19 0016  AMMONIA <9*   Coagulation Profile: Recent Labs  Lab 06/26/19 2353  INR 1.7*   Cardiac Enzymes: No results for input(s): CKTOTAL, CKMB, CKMBINDEX, TROPONINI in the last 168 hours. BNP (last 3 results) No results for input(s): PROBNP in the last 8760 hours. HbA1C: No results for input(s): HGBA1C in the last 72 hours. CBG: No results for input(s): GLUCAP in the last 168 hours. Lipid Profile: No results for input(s): CHOL, HDL, LDLCALC, TRIG, CHOLHDL, LDLDIRECT in the last 72 hours. Thyroid Function Tests: No results for input(s): TSH, T4TOTAL, FREET4, T3FREE, THYROIDAB in the last 72 hours. Anemia Panel: No results for input(s): VITAMINB12, FOLATE, FERRITIN, TIBC, IRON, RETICCTPCT in the last 72 hours. Urine analysis:    Component Value Date/Time   COLORURINE YELLOW (A) 06/27/2019 0431   APPEARANCEUR CLEAR (A) 06/27/2019 0431   APPEARANCEUR Hazy 09/22/2012 1750   LABSPEC 1.042 (H) 06/27/2019 0431   LABSPEC  1.035 09/22/2012 1750   PHURINE 5.0 06/27/2019 0431   GLUCOSEU NEGATIVE 06/27/2019 0431   GLUCOSEU Negative 09/22/2012 1750   HGBUR NEGATIVE 06/27/2019 0431   BILIRUBINUR NEGATIVE 06/27/2019 0431   BILIRUBINUR 1+ 09/22/2012 1750   KETONESUR NEGATIVE 06/27/2019 0431   PROTEINUR NEGATIVE 06/27/2019 0431   NITRITE NEGATIVE 06/27/2019 0431   LEUKOCYTESUR NEGATIVE 06/27/2019 0431   LEUKOCYTESUR 1+ 09/22/2012 1750   Sepsis Labs: @LABRCNTIP (procalcitonin:4,lacticacidven:4)  ) Recent Results (from the past 240 hour(s))  SARS Coronavirus 2 by RT PCR (hospital order, performed in [place] hospital lab) Nasopharyngeal Nasopharyngeal Swab     Status: None   Collection Time: 06/27/19 12:16 AM   Specimen: Nasopharyngeal Swab  Result Value Ref Range Status   SARS Coronavirus 2 NEGATIVE NEGATIVE Final    Comment: (NOTE) SARS-CoV-2 target nucleic acids are NOT DETECTED. The SARS-CoV-2 RNA is generally detectable in upper and lower respiratory specimens during the acute phase of infection. The lowest concentration of SARS-CoV-2 viral copies this assay can detect is 250 copies / mL. A negative result does not preclude SARS-CoV-2 infection and should not be used as the sole basis for treatment or other patient management decisions.  A negative result may occur with improper specimen collection / handling, submission of specimen other than nasopharyngeal swab, presence of viral mutation(s) within the areas targeted by this assay, and inadequate number of viral copies (<250 copies / mL). A negative result must be combined with clinical observations, patient history, and epidemiological information. Fact Sheet for Patients:   StrictlyIdeas.no Fact Sheet for Healthcare Providers: BankingDealers.co.za This test is not yet approved or cleared  by the [place] FDA and has been authorized for detection and/or diagnosis of SARS-CoV-2 by FDA under an  Emergency Use Authorization (EUA).  This EUA will remain in effect (meaning this test can be used) for the duration of the COVID-19 declaration under Section 564(b)(1) of the Act, 21 U.S.C. section 360bbb-3(b)(1), unless the authorization is terminated or revoked sooner. Performed at [facility], [address] [place], [place] [address]   Culture, blood (routine x 2)     Status: None (Preliminary result)   Collection Time: 06/27/19 12:17 AM   Specimen: Left Antecubital; Blood  Result Value Ref Range Status   Specimen Description LEFT ANTECUBITAL  Final   Special Requests   Final    BOTTLES DRAWN AEROBIC AND ANAEROBIC Blood Culture results may not be optimal due  to an excessive volume of blood received in culture bottles   Culture   Final    NO GROWTH 2 DAYS Performed at [facility], [address] [place], [place] [address]    Report Status PENDING  Incomplete  Culture, blood (routine x 2)     Status: None (Preliminary result)   Collection Time: 06/27/19 12:17 AM   Specimen: Right Antecubital; Blood  Result Value Ref Range Status   Specimen Description RIGHT ANTECUBITAL  Final   Special Requests   Final    BOTTLES DRAWN AEROBIC AND ANAEROBIC Blood Culture results may not be optimal due to an excessive volume of blood received in culture bottles   Culture   Final    NO GROWTH 2 DAYS Performed at [facility], [address], [place], [place] [address]    Report Status PENDING  Incomplete  Urine culture     Status: Abnormal   Collection Time: 06/27/19  4:31 AM   Specimen: Urine, Clean Catch  Result Value Ref Range Status   Specimen Description   Final    URINE, CLEAN CATCH Performed at [facility], [address] [place], [place] [address]    Special Requests   Final    NONE Performed at [facility], [address], [place], [place] [address]    Culture (A)  Final    <10,000 COLONIES/mL INSIGNIFICANT  GROWTH Performed at [facility], [place] [address] [place],  [address]    Report Status 06/28/2019 FINAL  Final         Radiology Studies: No results found.      Scheduled Meds: . buPROPion  150 mg Oral Daily  . Chlorhexidine Gluconate Cloth  6 each Topical Daily  . feeding supplement  1 Container Oral TID BM  . ferrous sulfate  325 mg Oral Q breakfast  . fluticasone furoate-vilanterol  1 puff Inhalation Daily   And  . umeclidinium bromide  1 puff Inhalation Daily  . iohexol  1,000 mL Oral Once  . levothyroxine  100 mcg Oral QAC breakfast  . losartan  25 mg Oral Daily  . methylPREDNISolone  4 mg Oral 3 x daily with food  . [START ON 06/30/2019] methylPREDNISolone  4 mg Oral 4X daily taper  . methylPREDNISolone  8 mg Oral Nightly  . mirtazapine  15 mg Oral QHS  . [START ON 06/30/2019] multivitamin with minerals  1 tablet Oral Daily  . QUEtiapine  25 mg Oral TID  . QUEtiapine  50 mg Oral QHS  . tamsulosin  0.8 mg Oral QPC breakfast   Continuous Infusions: . sodium chloride 250 mL (06/29/19 1032)  . cefTRIAXone (ROCEPHIN)  IV Stopped (06/28/19 1545)  . famotidine (PEPCID) IV 20 mg (06/29/19 1037)  . metronidazole 500 mg (06/29/19 1259)     LOS: 2 days    Time spent: 25 minutes    Edwin Dada, MD [facility] 06/29/2019, 1:20 PM     Please page though [place] or Epic secure chat:  For [employer], Adult nurse

## 2019-06-30 DIAGNOSIS — K529 Noninfective gastroenteritis and colitis, unspecified: Secondary | ICD-10-CM

## 2019-06-30 LAB — BPAM RBC
Blood Product Expiration Date: 202105142359
Blood Product Expiration Date: 202106062359
ISSUE DATE / TIME: 202105111122
ISSUE DATE / TIME: 202105130951
Unit Type and Rh: 5100
Unit Type and Rh: 5100

## 2019-06-30 LAB — TYPE AND SCREEN
ABO/RH(D): O POS
Antibody Screen: NEGATIVE
Unit division: 0
Unit division: 0

## 2019-06-30 LAB — BASIC METABOLIC PANEL
Anion gap: 3 — ABNORMAL LOW (ref 5–15)
BUN: 24 mg/dL — ABNORMAL HIGH (ref 8–23)
CO2: 22 mmol/L (ref 22–32)
Calcium: 8 mg/dL — ABNORMAL LOW (ref 8.9–10.3)
Chloride: 109 mmol/L (ref 98–111)
Creatinine, Ser: 0.66 mg/dL (ref 0.61–1.24)
GFR calc Af Amer: >60 mL/min (ref >60)
GFR calc non Af Amer: >60 mL/min (ref >60)
Glucose, Bld: 134 mg/dL — ABNORMAL HIGH (ref 70–99)
Potassium: 3.9 mmol/L (ref 3.5–5.1)
Sodium: 134 mmol/L — ABNORMAL LOW (ref 135–145)

## 2019-06-30 LAB — CBC
HCT: 22.9 % — ABNORMAL LOW (ref 39.0–52.0)
Hemoglobin: 8 g/dL — ABNORMAL LOW (ref 13.0–17.0)
MCH: 31.7 pg (ref 26.0–34.0)
MCHC: 34.9 g/dL (ref 30.0–36.0)
MCV: 90.9 fL (ref 80.0–100.0)
Platelets: 96 10*3/uL — ABNORMAL LOW (ref 150–400)
RBC: 2.52 MIL/uL — ABNORMAL LOW (ref 4.22–5.81)
RDW: 20.8 % — ABNORMAL HIGH (ref 11.5–15.5)
WBC: 8.6 10*3/uL (ref 4.0–10.5)
nRBC: 0 % (ref 0.0–0.2)

## 2019-06-30 MED ORDER — ENSURE ENLIVE PO LIQD
237.0000 mL | Freq: Three times a day (TID) | ORAL | Status: DC
  Administered 2019-06-30: 237 mL via ORAL

## 2019-06-30 MED ORDER — CEFDINIR 300 MG PO CAPS
300.0000 mg | ORAL_CAPSULE | Freq: Two times a day (BID) | ORAL | 0 refills | Status: DC
Start: 2019-06-30 — End: 2019-08-03

## 2019-06-30 MED ORDER — METRONIDAZOLE 250 MG PO TABS: 250.0000 mg | ORAL_TABLET | Freq: Three times a day (TID) | ORAL | 0 refills | Status: AC

## 2019-06-30 MED ORDER — METHYLPREDNISOLONE 4 MG PO TABS
ORAL_TABLET | ORAL | 0 refills | Status: DC
Start: 2019-06-30 — End: 2019-08-03

## 2019-06-30 NOTE — Discharge Summary (Signed)
Physician Discharge Summary  [name] YQI:[id number] DOB: 06-24-1945 DOA: 06/26/2019  PCP: John Haggard, FNP  Admit date: 06/26/2019 Discharge date: 06/30/2019  Admitted From: Group home  Disposition: Group Home with home health  Recommendations for Outpatient Follow-up:  1. Follow up with Oncology Dr. Grayland Ormond in 1 week 2. Please obtain BMP/CBC in one week     Home Health: PT/OT due to ongoing instability and unsafe to leave home Equipment/Devices: None  Discharge Condition: Fair  CODE STATUS: DO NOT RESUSCITATE Diet recommendation: Regular  Brief/Interim Summary: [name] is a 74 y.o. 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    In the ER, found to have hypotension, abdominal pain.  CT abdomen pelvis showed extensive acute colitis in the right colon, cecum, transverse colon and proximal descending colon.  She was started on broad-spectrum antibiotics, IV fluids, and blood transfusion was ordered and the patient was admitted to the ICU.         PRINCIPAL HOSPITAL DIAGNOSIS: Septic shock due to colitis    Discharge Diagnoses:   Septic shock due to colitis Colitis with microperforation Patient presented with hypotension requiring ICU admission, altered mental status cardiac, and fever.  Suspected source colitis. CT abdomen at admission showed microperforation, but without acute abdomen.  He was admitted and started on empiric antibiotics, IV fluids, and bowel rest.  After discussion with oncology, he was started on steroids to empirically treat pembrolizumab related colitis.  His fever curve improved, serial abdominal exams were reassuring, his appetite was good, and he had no diarrhea.  GI PCR  panel negative for typical pathogens.  He has now been 24 hours without fever, and is tolerating oral intake well.  We will continue 3 more days oral antibiotics and steroids.  Follow-up with oncology.    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  Discussed this with Oncology.  Hard to distinguish clinically from infectious colitis, and treatment would be with steroid taper and outpatient follow up.   Complete Medrol Dosepak, follow-up with oncology.  Smoker COPD without exacerbation Smoking cessation recommended  Anemia and thrombocytopenia secondary to chemotherapy Transfused 2 units on admission, hemoglobin subsequently stable, slight trend downward.    Never had clinical bleeding, suspect some mild blood loss due to colitis, as well as dilution.  Likely also chemotherapy related anemia.  Received total 3 units blood transfusion while in the hospital.  Repeat CBC in 1 week.    Hepatitis C cirrhosis Compensated  Severe protein calorie malnutrition  Hypothyroidism Continue levothyroxine  Hypertension Continue losartan  BPH  Continue Flomax  Acute metabolic encephalopathy due to sepsis superimposed on dementia Encephalopathy present on admission, subsequently resolved to baseline with fluids, antibiotics, and steroids.  At baseline the patient lives in a group home, he has dementia.            Discharge Instructions   Allergies as of 06/30/2019      Reactions   Penicillins Other (See Comments)   Reaction: unknown Patient is not able to answer follow up questions      Medication List    TAKE these medications   alum & mag hydroxide-simeth 200-200-20 MG/5ML  suspension Commonly known as: MAALOX/MYLANTA Take 15 mLs by mouth as needed for indigestion or heartburn.   buPROPion 150 MG 12 hr  tablet Commonly known as: WELLBUTRIN SR bupropion HCl SR 150 mg tablet,12 hr sustained-release   cefdinir 300 MG capsule Commonly known as: OMNICEF Take 1 capsule (300 mg total) by mouth 2 (two) times daily.   docusate 50 MG/5ML liquid Commonly known as: COLACE Take 100 mg by mouth daily.   ferrous sulfate 325 (65 FE) MG tablet Take 325 mg by mouth daily with breakfast.   folic acid 1 MG tablet Commonly known as: FOLVITE Take 1 mg by mouth daily.   hydrOXYzine 25 MG tablet Commonly known as: ATARAX/VISTARIL Take 100 mg by mouth at bedtime.   lactose free nutrition Liqd Take 237 mLs by mouth 4 (four) times daily.   levothyroxine 100 MCG tablet Commonly known as: SYNTHROID Take 100 mcg by mouth daily before breakfast.   lidocaine-prilocaine cream Commonly known as: EMLA Apply to affected area once   losartan 25 MG tablet Commonly known as: COZAAR Take 25 mg by mouth daily.   methylPREDNISolone 4 MG tablet Commonly known as: Medrol Take methylprednisolone 4 mg (1 tab) four times daily for 1 day then take 4mg  (1 tab) three times daily for 1 day then take 4 mg (1 tab) two times daily for 1 day then take 4 mg (1 tab) once daily for 1 day then stop   metroNIDAZOLE 250 MG tablet Commonly known as: Flagyl Take 1 tablet (250 mg total) by mouth 3 (three) times daily for 3 days.   mirtazapine 15 MG tablet Commonly known as: REMERON Take 15 mg by mouth at bedtime.   mupirocin ointment 2 % Commonly known as: BACTROBAN Apply 1 application topically 2 (two) times daily.   ondansetron 8 MG tablet Commonly known as: ZOFRAN Take by mouth every 8 (eight) hours as needed for nausea or vomiting.   polyethylene glycol 17 g packet Commonly known as: MIRALAX / GLYCOLAX Take 17 g by mouth daily.   prochlorperazine 10 MG tablet Commonly known as: COMPAZINE Take 1 tablet (10 mg total) by mouth every 6 (six) hours as needed (Nausea or vomiting).   QUEtiapine 25 MG  tablet Commonly known as: SEROQUEL Take 25 mg by mouth 3 (three) times daily. What changed: Another medication with the same name was changed. Make sure you understand how and when to take each.   QUEtiapine 50 MG tablet Commonly known as: SEROQUEL Take 1 tablet (50 mg total) by mouth at bedtime. What changed: additional instructions   tamsulosin 0.4 MG Caps capsule Commonly known as: FLOMAX Take 0.8 mg by mouth daily after breakfast.   thiamine 100 MG tablet Take 100 mg by mouth daily.   Trelegy Ellipta 100-62.5-25 MCG/INH Aepb Generic drug: Fluticasone-Umeclidin-Vilant Inhale 1 puff into the lungs in the morning.   vitamin C 250 MG tablet Commonly known as: ASCORBIC ACID Take 500 mg by mouth daily.   Vitamin D (Ergocalciferol) 1.25 MG (50000 UNIT) Caps capsule Commonly known as: DRISDOL Take 50,000 Units by mouth every 7 (seven) days.   Vitamin D3 125 MCG (5000 UT) Tabs Take 5,000 Units by mouth daily.      Follow-up Information    Lloyd Huger, MD. Schedule an appointment as soon as possible for a visit on 07/07/2019.   Specialty: Oncology Contact information: [place] [address] [phone]          Allergies  Allergen Reactions  .  Penicillins Other (See Comments)    Reaction: unknown Patient is not able to answer follow up questions    Consultations:     Procedures/Studies: CT Head Wo Contrast  Result Date: 06/27/2019 CLINICAL DATA:  Hypotension, altered level of consciousness, history of head and neck and lung cancer, cirrhosis, dementia EXAM: CT HEAD WITHOUT CONTRAST TECHNIQUE: Contiguous axial images were obtained from the base of the skull through the vertex without intravenous contrast. 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 No signs of acute infarct or hemorrhage. Lateral ventricles and midline structures are stable.  No acute extra-axial fluid collections. Chronic dural thickening right frontal para falcine region, stable. No mass effect. Vascular: No hyperdense vessel or unexpected calcification. Skull: Normal. Negative for fracture or focal lesion. Sinuses/Orbits: Mild mucosal thickening throughout the ethmoid and maxillary sinuses. Other: None. IMPRESSION: 1. Chronic ischemic changes, no acute process. Electronically Signed   By: Randa Ngo M.D.   On: 06/27/2019 00:49   CT Abdomen Pelvis W Contrast  Result Date: 06/27/2019 CLINICAL DATA:  Abdominal distension, hypotensive EXAM: CT ABDOMEN AND PELVIS WITH CONTRAST TECHNIQUE: Multidetector CT imaging of the abdomen and pelvis was performed using the standard protocol following bolus administration of intravenous contrast. CONTRAST:  1104mL OMNIPAQUE IOHEXOL 300 MG/ML  SOLN COMPARISON:  March 30, 2019 FINDINGS: Lower chest: The visualized heart size within normal limits. There is a trace pericardial effusion. A small right pleural effusion is present. Coarse calcifications are seen along both lung bases. Hepatobiliary: There is a shrunken nodular contour to the liver parenchyma.The main portal vein is patent. Layering calcified gallstones are present. Pancreas: Unremarkable. No pancreatic ductal dilatation or surrounding inflammatory changes. Spleen: Normal in size without focal abnormality. Adrenals/Urinary Tract: Both adrenal glands appear normal. There is a small 1 cm low-density lesion seen within the upper pole of the right kidney. The left kidney is unremarkable. Bladder is unremarkable. Stomach/Bowel: The stomach and small bowel are grossly unremarkable. There is significant wall thickening and surrounding fat stranding changes seen predominantly around the right colon and cecum as well as the transverse colon and proximal descending colon. There is bubbly foci air within the right upper quadrant, likely thought to be within the cecum, however on series 2, image  37 there are 2 foci of air which could be extraluminal. No definite loculated fluid collections are seen. There scattered colonic diverticulosis.The patient is status post appendectomy. Vascular/Lymphatic: There are no enlarged mesenteric, retroperitoneal, or pelvic lymph nodes. Scattered aortic atherosclerotic calcifications are seen without aneurysmal dilatation. Reproductive: The prostate is unremarkable. Other: Diffuse mild mesenteric edema is seen throughout. A small amount of deep pelvic ascites is present. Musculoskeletal: No acute or significant osseous findings. IMPRESSION: 1. Findings consistent with extensive acute colitis involving the right colon, cecum, transverse colon and proximal descending colon. This is most significant around the cecum with question a small foci of extraluminal air suggestive of perforation as described above. No definite loculated fluid collections are seen. 2. Small amount of deep pelvic ascites. 3. Small right pleural effusion and trace pericardial effusion 4. Findings suggestive of cirrhosis 5. Cholelithiasis 6.  Aortic Atherosclerosis (ICD10-I70.0). 7. These results were called by telephone at the time of interpretation on 06/27/2019 at 1:02 am to provider JADE SUNG , who verbally acknowledged these results. Electronically Signed   By: Prudencio Pair M.D.   On: 06/27/2019 01:03   DG Chest Port 1 View  Result Date: 06/27/2019 CLINICAL DATA:  Confusion EXAM: PORTABLE CHEST 1 VIEW COMPARISON:  April 12, 2019 FINDINGS: The heart size and mediastinal contours are within normal limits. Aortic knob calcifications are seen. Again noted is a spiculated nodular opacity within the right upper lung. There is prominence of the central pulmonary vasculature. There is mildly increased interstitial markings seen throughout both lungs as on the prior exam. No pleural effusion is seen. No acute osseous abnormality. A right-sided MediPort catheter seen with the tip at the superior  cavoatrial junction. IMPRESSION: Spiculated nodular opacity at the right upper lung, consistent with the patient's known pulmonary mass Mildly increased interstitial markings throughout both lungs as on prior exam which may be due to chronic lung changes Electronically Signed   By: Prudencio Pair M.D.   On: 06/27/2019 00:10      Subjective: No abdominal pain, confusion, fever overnight.  This morning appetite is good.  Ambulating around the room with assistance.  Discharge Exam: Vitals:   06/30/19 0415 06/30/19 0738  BP: 139/76 116/73  Pulse: 73 74  Resp: 18 20  Temp: 97.8 F (36.6 C) 98.4 F (36.9 C)  SpO2: 97% 94%   Vitals:   06/29/19 1947 06/30/19 0415 06/30/19 0700 06/30/19 0738  BP: (!) 113/56 139/76  116/73  Pulse: 77 73  74  Resp: 16 18  20   Temp: (!) 97.3 F (36.3 C) 97.8 F (36.6 C)  98.4 F (36.9 C)  TempSrc: Oral Oral  Oral  SpO2: 95% 97%  94%  Weight:   64.6 kg   Height:        General: Pt is alert, awake, not in acute distress, sitting on the edge of the bed, eating breakfast. Cardiovascular: RRR, nl S1-S2, no murmurs appreciated.   No LE edema.   Respiratory: Normal respiratory rate and rhythm.  CTAB without rales or wheezes. Abdominal: Abdomen soft and non-tender.  No distension or HSM.   Neuro/Psych: Strength symmetric in upper and lower extremities.  Judgment and insight appear impaired, but at baseline.   The results of significant diagnostics from this hospitalization (including imaging, microbiology, ancillary and laboratory) are listed below for reference.     Microbiology: Recent Results (from the past 240 hour(s))  SARS Coronavirus 2 by RT PCR (hospital order, performed in [facility] hospital lab) Nasopharyngeal Nasopharyngeal Swab     Status: None   Collection Time: 06/27/19 12:16 AM   Specimen: Nasopharyngeal Swab  Result Value Ref Range Status   SARS Coronavirus 2 NEGATIVE NEGATIVE Final    Comment: (NOTE) SARS-CoV-2 target nucleic acids  are NOT DETECTED. The SARS-CoV-2 RNA is generally detectable in upper and lower respiratory specimens during the acute phase of infection. The lowest concentration of SARS-CoV-2 viral copies this assay can detect is 250 copies / mL. A negative result does not preclude SARS-CoV-2 infection and should not be used as the sole basis for treatment or other patient management decisions.  A negative result may occur with improper specimen collection / handling, submission of specimen other than nasopharyngeal swab, presence of viral mutation(s) within the areas targeted by this assay, and inadequate number of viral copies (<250 copies / mL). A negative result must be combined with clinical observations, patient history, and epidemiological information. Fact Sheet for Patients:   StrictlyIdeas.no Fact Sheet for Healthcare Providers: BankingDealers.co.za This test is not yet approved or cleared  by the [place] FDA and has been authorized for detection and/or diagnosis of SARS-CoV-2 by FDA under an Emergency Use Authorization (EUA).  This EUA  will remain in effect (meaning this test can be used) for the duration of the COVID-19 declaration under Section 564(b)(1) of the Act, 21 U.S.C. section 360bbb-3(b)(1), unless the authorization is terminated or revoked sooner. Performed at [facility], [address] [place], [place] [address]   Culture, blood (routine x 2)     Status: None (Preliminary result)   Collection Time: 06/27/19 12:17 AM   Specimen: Left Antecubital; Blood  Result Value Ref Range Status   Specimen Description LEFT ANTECUBITAL  Final   Special Requests   Final    BOTTLES DRAWN AEROBIC AND ANAEROBIC Blood Culture results may not be optimal due to an excessive volume of blood received in culture bottles   Culture   Final    NO GROWTH 3 DAYS Performed at [facility], [address] [place], [place]  [address]    Report Status PENDING  Incomplete  Culture, blood (routine x 2)     Status: None (Preliminary result)   Collection Time: 06/27/19 12:17 AM   Specimen: Right Antecubital; Blood  Result Value Ref Range Status   Specimen Description RIGHT ANTECUBITAL  Final   Special Requests   Final    BOTTLES DRAWN AEROBIC AND ANAEROBIC Blood Culture results may not be optimal due to an excessive volume of blood received in culture bottles   Culture   Final    NO GROWTH 3 DAYS Performed at [facility], [address] [place], [place] [address]    Report Status PENDING  Incomplete  Urine culture     Status: Abnormal   Collection Time: 06/27/19  4:31 AM   Specimen: Urine, Clean Catch  Result Value Ref Range Status   Specimen Description   Final    URINE, CLEAN CATCH Performed at [facility], [address], [place], [place] [address]    Special Requests   Final    NONE Performed at [facility], [address] [place], [place] [address]    Culture (A)  Final    <10,000 COLONIES/mL INSIGNIFICANT GROWTH Performed at [facility], [place] [address], [place],  [address]    Report Status 06/28/2019 FINAL  Final  Gastrointestinal Panel by PCR , Stool     Status: None   Collection Time: 06/29/19  5:00 AM   Specimen: Stool  Result Value Ref Range Status   Campylobacter species NOT DETECTED NOT DETECTED Final   Plesimonas shigelloides NOT DETECTED NOT DETECTED Final   Salmonella species NOT DETECTED NOT DETECTED Final   Yersinia enterocolitica NOT DETECTED NOT DETECTED Final   Vibrio species NOT DETECTED NOT DETECTED Final   Vibrio cholerae NOT DETECTED NOT DETECTED Final   Enteroaggregative E coli (EAEC) NOT DETECTED NOT DETECTED Final   Enteropathogenic E coli (EPEC) NOT DETECTED NOT DETECTED Final   Enterotoxigenic E coli (ETEC) NOT DETECTED NOT DETECTED Final   Shiga like toxin producing E coli (STEC) NOT DETECTED NOT DETECTED Final    Shigella/Enteroinvasive E coli (EIEC) NOT DETECTED NOT DETECTED Final   Cryptosporidium NOT DETECTED NOT DETECTED Final   Cyclospora cayetanensis NOT DETECTED NOT DETECTED Final   Entamoeba histolytica NOT DETECTED NOT DETECTED Final   Giardia lamblia NOT DETECTED NOT DETECTED Final   Adenovirus F40/41 NOT DETECTED NOT DETECTED Final   Astrovirus NOT DETECTED NOT DETECTED Final   Norovirus GI/GII NOT DETECTED NOT DETECTED Final   Rotavirus A NOT DETECTED NOT DETECTED Final   Sapovirus (I, II, IV, and V) NOT  DETECTED NOT DETECTED Final    Comment: Performed at [facility], [address] [place], [place] [address]     Labs: BNP (last 3 results) No results for input(s): BNP in the last 8760 hours. Basic Metabolic Panel: Recent Labs  Lab 06/27/19 0431 06/27/19 1535 06/28/19 0635 06/29/19 0610 06/30/19 0450  NA 136 136 133* 134* 134*  K 4.3 4.3 3.7 4.0 3.9  CL 111 110 108 109 109  CO2 22 21* 22 21* 22  GLUCOSE 103* 115* 111* 138* 134*  BUN 33* 28* 29* 28* 24*  CREATININE 1.08 1.05 0.99 0.85 0.66  CALCIUM 8.0* 8.2* 8.1* 7.9* 8.0*  MG 1.4*  --  1.9  --   --   PHOS 2.5  --  2.7  --   --    Liver Function Tests: Recent Labs  Lab 06/26/19 2353 06/27/19 0431 06/27/19 1535  AST 37 45* 48*  ALT 35 43 42  ALKPHOS 48 63 67  BILITOT 1.6* 2.1* 2.5*  PROT 4.4* 5.7* 6.1*  ALBUMIN 1.7* 2.1* 2.3*   Recent Labs  Lab 06/26/19 2353  LIPASE 19   Recent Labs  Lab 06/27/19 0016  AMMONIA <9*   CBC: Recent Labs  Lab 06/26/19 2353 06/26/19 2353 06/27/19 1535 06/28/19 0157 06/28/19 0635 06/29/19 0610 06/30/19 0450  WBC 8.7   < > 9.7 8.0 7.1 4.3 8.6  NEUTROABS 7.0  --  7.4 6.1  --   --   --   HGB 6.1*   < > 7.9* 7.7* 7.4* 6.8* 8.0*  HCT 17.8*   < > 22.7* 22.6* 21.8* 19.1* 22.9*  MCV 99.4   < > 93.8 96.2 96.0 92.3 90.9  PLT 124*   < > 126* 120* 124* 100* 96*   < > = values in this interval not displayed.   Cardiac Enzymes: No results for input(s): CKTOTAL,  CKMB, CKMBINDEX, TROPONINI in the last 168 hours. BNP: Invalid input(s): POCBNP CBG: No results for input(s): GLUCAP in the last 168 hours. D-Dimer No results for input(s): DDIMER in the last 72 hours. Hgb A1c No results for input(s): HGBA1C in the last 72 hours. Lipid Profile No results for input(s): CHOL, HDL, LDLCALC, TRIG, CHOLHDL, LDLDIRECT in the last 72 hours. Thyroid function studies No results for input(s): TSH, T4TOTAL, T3FREE, THYROIDAB in the last 72 hours.  Invalid input(s): FREET3 Anemia work up No results for input(s): VITAMINB12, FOLATE, FERRITIN, TIBC, IRON, RETICCTPCT in the last 72 hours. Urinalysis    Component Value Date/Time   COLORURINE YELLOW (A) 06/27/2019 0431   APPEARANCEUR CLEAR (A) 06/27/2019 0431   APPEARANCEUR Hazy 09/22/2012 1750   LABSPEC 1.042 (H) 06/27/2019 0431   LABSPEC 1.035 09/22/2012 1750   PHURINE 5.0 06/27/2019 0431   GLUCOSEU NEGATIVE 06/27/2019 0431   GLUCOSEU Negative 09/22/2012 1750   HGBUR NEGATIVE 06/27/2019 0431   BILIRUBINUR NEGATIVE 06/27/2019 0431   BILIRUBINUR 1+ 09/22/2012 1750   KETONESUR NEGATIVE 06/27/2019 0431   PROTEINUR NEGATIVE 06/27/2019 0431   NITRITE NEGATIVE 06/27/2019 0431   LEUKOCYTESUR NEGATIVE 06/27/2019 0431   LEUKOCYTESUR 1+ 09/22/2012 1750   Sepsis Labs Invalid input(s): PROCALCITONIN,  WBC,  LACTICIDVEN Microbiology Recent Results (from the past 240 hour(s))  SARS Coronavirus 2 by RT PCR (hospital order, performed in [facility] hospital lab) Nasopharyngeal Nasopharyngeal Swab     Status: None   Collection Time: 06/27/19 12:16 AM   Specimen: Nasopharyngeal Swab  Result Value Ref Range Status   SARS Coronavirus 2 NEGATIVE NEGATIVE Final  Comment: (NOTE) SARS-CoV-2 target nucleic acids are NOT DETECTED. The SARS-CoV-2 RNA is generally detectable in upper and lower respiratory specimens during the acute phase of infection. The lowest concentration of SARS-CoV-2 viral copies this assay can detect  is 250 copies / mL. A negative result does not preclude SARS-CoV-2 infection and should not be used as the sole basis for treatment or other patient management decisions.  A negative result may occur with improper specimen collection / handling, submission of specimen other than nasopharyngeal swab, presence of viral mutation(s) within the areas targeted by this assay, and inadequate number of viral copies (<250 copies / mL). A negative result must be combined with clinical observations, patient history, and epidemiological information. Fact Sheet for Patients:   StrictlyIdeas.no Fact Sheet for Healthcare Providers: BankingDealers.co.za This test is not yet approved or cleared  by the [place] FDA and has been authorized for detection and/or diagnosis of SARS-CoV-2 by FDA under an Emergency Use Authorization (EUA).  This EUA will remain in effect (meaning this test can be used) for the duration of the COVID-19 declaration under Section 564(b)(1) of the Act, 21 U.S.C. section 360bbb-3(b)(1), unless the authorization is terminated or revoked sooner. Performed at [facility], [address] [place], [place] [address]   Culture, blood (routine x 2)     Status: None (Preliminary result)   Collection Time: 06/27/19 12:17 AM   Specimen: Left Antecubital; Blood  Result Value Ref Range Status   Specimen Description LEFT ANTECUBITAL  Final   Special Requests   Final    BOTTLES DRAWN AEROBIC AND ANAEROBIC Blood Culture results may not be optimal due to an excessive volume of blood received in culture bottles   Culture   Final    NO GROWTH 3 DAYS Performed at [facility], [address], [place], [place] [address]    Report Status PENDING  Incomplete  Culture, blood (routine x 2)     Status: None (Preliminary result)   Collection Time: 06/27/19 12:17 AM   Specimen: Right Antecubital; Blood  Result Value Ref Range  Status   Specimen Description RIGHT ANTECUBITAL  Final   Special Requests   Final    BOTTLES DRAWN AEROBIC AND ANAEROBIC Blood Culture results may not be optimal due to an excessive volume of blood received in culture bottles   Culture   Final    NO GROWTH 3 DAYS Performed at [facility], [address], [place], [place] [address]    Report Status PENDING  Incomplete  Urine culture     Status: Abnormal   Collection Time: 06/27/19  4:31 AM   Specimen: Urine, Clean Catch  Result Value Ref Range Status   Specimen Description   Final    URINE, CLEAN CATCH Performed at [facility], [address], [place], [place] [address]    Special Requests   Final    NONE Performed at [facility], [address], [place], [place] [address]    Culture (A)  Final    <10,000 COLONIES/mL INSIGNIFICANT GROWTH Performed at [facility], [place] [address] [place], [place] [address]    Report Status 06/28/2019 FINAL  Final  Gastrointestinal Panel by PCR , Stool     Status: None   Collection Time: 06/29/19  5:00 AM   Specimen: Stool  Result Value Ref Range Status   Campylobacter species NOT DETECTED NOT DETECTED Final   Plesimonas shigelloides NOT DETECTED NOT DETECTED Final   Salmonella species  NOT DETECTED NOT DETECTED Final   Yersinia enterocolitica NOT DETECTED NOT DETECTED Final   Vibrio species NOT DETECTED NOT DETECTED Final   Vibrio cholerae NOT DETECTED NOT DETECTED Final   Enteroaggregative E coli (EAEC) NOT DETECTED NOT DETECTED Final   Enteropathogenic E coli (EPEC) NOT DETECTED NOT DETECTED Final   Enterotoxigenic E coli (ETEC) NOT DETECTED NOT DETECTED Final   Shiga like toxin producing E coli (STEC) NOT DETECTED NOT DETECTED Final   Shigella/Enteroinvasive E coli (EIEC) NOT DETECTED NOT DETECTED Final   Cryptosporidium NOT DETECTED NOT DETECTED Final   Cyclospora cayetanensis NOT DETECTED NOT DETECTED Final   Entamoeba histolytica NOT DETECTED NOT  DETECTED Final   Giardia lamblia NOT DETECTED NOT DETECTED Final   Adenovirus F40/41 NOT DETECTED NOT DETECTED Final   Astrovirus NOT DETECTED NOT DETECTED Final   Norovirus GI/GII NOT DETECTED NOT DETECTED Final   Rotavirus A NOT DETECTED NOT DETECTED Final   Sapovirus (I, II, IV, and V) NOT DETECTED NOT DETECTED Final    Comment: Performed at [facility], [address] [place], [place] [address]     Time coordinating discharge: 35 minutes The [place] controlled substances registry was reviewed for this patient       SIGNED:   Edwin Dada, MD  [facility] 06/30/2019, 10:08 AM

## 2019-06-30 NOTE — Progress Notes (Addendum)
PHARMACY CONSULT NOTE - FOLLOW UP  Pharmacy Consult for Electrolyte Monitoring and Replacement   Recent Labs: Potassium (mmol/L)  Date Value  06/30/2019 3.9  09/27/2012 3.7   Magnesium (mg/dL)  Date Value  06/28/2019 1.9  09/27/2012 1.4 (L)   Calcium (mg/dL)  Date Value  06/30/2019 8.0 (L)   Calcium, Total (mg/dL)  Date Value  09/26/2012 8.6   Albumin (g/dL)  Date Value  06/27/2019 2.3 (L)  09/23/2012 2.1 (L)   Phosphorus (mg/dL)  Date Value  06/28/2019 2.7   Sodium (mmol/L)  Date Value  06/30/2019 134 (L)  09/26/2012 137    Assessment: 74 y.o. 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  Goal of Therapy:  Maintain electolytes WNL  Plan:  No replacement needed at this time. Electrolytes seems to be stable. Pharmacy will sign off at this time. Please reconsult in needed.   Oswald Hillock ,PharmD, BCPS Clinical Pharmacist 06/30/2019 7:30 AM

## 2019-06-30 NOTE — NC FL2 (Signed)
[place] LEVEL OF CARE SCREENING TOOL     IDENTIFICATION  Patient Name: [name] Birthdate: [birth year] Sex: male Admission Date (Current Location): 06/26/2019  [place] and [place] Number:  Engineering geologist and Address:  [facility], [address], [place], [place] [address]      Provider Number: [id number]  Attending Physician Name and Address:  Edwin Dada, *  Relative Name and Phone Number:  Levander Campion ([place])  [phone]    Current Level of Care: Hospital Recommended Level of Care: [facility] Prior Approval Number:    Date Approved/Denied:   PASRR Number:    Discharge Plan: (Group Home/Assisted Living Facility)    Current Diagnoses: Patient Active Problem List   Diagnosis Date Noted  . Hypotension 06/27/2019  . Alcoholic hepatitis 72/53/6644  . Anxiety 04/20/2019  . Benign prostatic hyperplasia 04/20/2019  . HCV (hepatitis C virus) 04/20/2019  . History of cocaine use 04/20/2019  . History of fall 04/20/2019  . History of noncompliance with medical treatment 04/20/2019  . History of gastric ulcer 04/20/2019  . Jaw fracture ([place]) 04/20/2019  . Skin erosion 04/20/2019  . Tobacco abuse 04/20/2019  . Urinary retention 04/20/2019  . 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 Near syncope   . Encounter for hospice care discussion   . Dehydration   . Renal insufficiency   . Protein-calorie malnutrition, severe 05/14/2016  . Throat cancer ([place])   . Palliative care encounter   . AKI (acute kidney injury) ([place]) 05/13/2016  . Goals of care, counseling/discussion 03/01/2016  . Primary cancer of larynx ([place]) 02/19/2016  . Alcohol abuse 11/18/2015  . Dementia with behavioral disturbance ([place]) 10/21/2015    Orientation RESPIRATION BLADDER Height & Weight     Self, Place  Normal Continent Weight: 64.6 kg Height:  5\' 6"  (167.6 cm)  BEHAVIORAL SYMPTOMS/MOOD NEUROLOGICAL BOWEL NUTRITION STATUS       Continent Diet  AMBULATORY STATUS COMMUNICATION OF NEEDS Skin   Supervision Verbally                         Personal Care Assistance Level of Assistance  Bathing, Dressing Bathing Assistance: Limited assistance   Dressing Assistance: Limited assistance     Functional Limitations Info  Hearing   Hearing Info: Adequate      SPECIAL CARE FACTORS FREQUENCY                       Contractures Contractures Info: Not present    Additional Factors Info  Code Status, Allergies Code Status Info: DNR Allergies Info: Penicillin           Current Medications (06/30/2019):  This is the current hospital active medication list Current Facility-Administered Medications  Medication Dose Route Frequency Provider Last Rate Last Admin  . 0.9 %  sodium chloride infusion   Intravenous PRN Edwin Dada, MD   Stopped at 06/29/19 1549  . buPROPion (WELLBUTRIN SR) 12 hr tablet 150 mg  150 mg Oral Daily Edwin Dada, MD   150 mg at 06/30/19 1015  . cefTRIAXone (ROCEPHIN) 2 g in sodium chloride 0.9 % 100 mL IVPB  2 g Intravenous Q24H Edwin Dada, MD   Stopped at 06/29/19 1459  . Chlorhexidine Gluconate Cloth 2 % PADS 6 each  6 each Topical Daily Danford, Suann Larry, MD   6 each at 06/28/19 2311  . docusate sodium (COLACE) capsule  100 mg  100 mg Oral BID PRN Awilda Bill, NP      . famotidine (PEPCID) IVPB 20 mg premix  20 mg Intravenous Q12H Awilda Bill, NP 100 mL/hr at 06/30/19 1020 20 mg at 06/30/19 1020  . feeding supplement (ENSURE ENLIVE) (ENSURE ENLIVE) liquid 237 mL  237 mL Oral TID BM Danford, Suann Larry, MD   237 mL at 06/30/19 1015  . ferrous sulfate tablet 325 mg  325 mg Oral Q breakfast Edwin Dada, MD   325 mg at 06/30/19 1014  . fluticasone furoate-vilanterol (BREO ELLIPTA) 100-25 MCG/INH 1 puff  1 puff Inhalation Daily Flora Lipps, MD   1 puff at 06/30/19 1015   And  . umeclidinium bromide (INCRUSE ELLIPTA)  62.5 MCG/INH 1 puff  1 puff Inhalation Daily Flora Lipps, MD   1 puff at 06/30/19 1015  . iohexol (OMNIPAQUE) 9 MG/ML oral solution 1,000 mL  1,000 mL Oral Once Paulette Blanch, MD   Stopped at 06/27/19 1137  . levothyroxine (SYNTHROID) tablet 100 mcg  100 mcg Oral QAC breakfast Edwin Dada, MD   100 mcg at 06/30/19 0409  . losartan (COZAAR) tablet 25 mg  25 mg Oral Daily Danford, Suann Larry, MD   25 mg at 06/30/19 1014  . methylPREDNISolone (MEDROL DOSEPAK) tablet 4 mg  4 mg Oral 4X daily taper Edwin Dada, MD   4 mg at 06/30/19 1014  . metroNIDAZOLE (FLAGYL) IVPB 500 mg  500 mg Intravenous Q8H Awilda Bill, NP 100 mL/hr at 06/30/19 0407 500 mg at 06/30/19 0407  . mirtazapine (REMERON) tablet 15 mg  15 mg Oral QHS Edwin Dada, MD   15 mg at 06/29/19 2243  . multivitamin with minerals tablet 1 tablet  1 tablet Oral Daily Danford, Suann Larry, MD   1 tablet at 06/30/19 1014  . ondansetron (ZOFRAN) injection 4 mg  4 mg Intravenous Q6H PRN Awilda Bill, NP      . polyethylene glycol (MIRALAX / GLYCOLAX) packet 17 g  17 g Oral Daily PRN Awilda Bill, NP      . QUEtiapine (SEROQUEL) tablet 25 mg  25 mg Oral TID Edwin Dada, MD   25 mg at 06/30/19 1015  . QUEtiapine (SEROQUEL) tablet 50 mg  50 mg Oral QHS Edwin Dada, MD   50 mg at 06/29/19 2243  . tamsulosin (FLOMAX) capsule 0.8 mg  0.8 mg Oral QPC breakfast Danford, Suann Larry, MD   0.8 mg at 06/30/19 1015   Facility-Administered Medications Ordered in Other Encounters  Medication Dose Route Frequency Provider Last Rate Last Admin  . heparin lock flush 100 unit/mL  500 Units Intravenous Once Lloyd Huger, MD         Discharge Medications: Please see discharge summary for a list of discharge medications.  Relevant Imaging Results:  Relevant Lab Results:   Additional Information [id number]  Victorino Dike, RN

## 2019-06-30 NOTE — TOC Transition Note (Signed)
Transition of Care [facility]) - CM/SW Discharge Note   Patient Details  Name: [name] MRN: [id number] Date of Birth: October 21, 1945  [facility]) CM/SW Contact:  Victorino Dike, RN Phone Number: 06/30/2019, 11:25 AM  Clinical Narrative:     Patient to be discharged back to [place] today.  [place] services will be provided from [facility] for PT/OT starting on Monday May May 17th. Group Home to provided Transport.  Melida Quitter is contact. FL2 completed and placed in Discharge packet on chart.  No further TOC needs at this time, please re-consult for new needs.     Final next level of care: Group Home(with [facility] health) Barriers to Discharge: Barriers Resolved   Patient Goals and CMS Choice   CMS Medicare.gov Compare Post Acute Care list provided to:: Legal Guardian Choice offered to / list presented to : [place] / [place]  Discharge Placement                  Name of family member notified: Melida Quitter Patient and family notified of of transfer: 06/30/19  Discharge Plan and Services   Discharge Planning Services: CM Consult Post Acute Care Choice: [place]: [place] ([place]) Date [facility] Agency Contacted: 06/28/19 Time [place]: 5974 Representative spoke with at [place]: [place] ([place]) Interventions     Readmission Risk Interventions No flowsheet data found.

## 2019-07-02 LAB — CULTURE, BLOOD (ROUTINE X 2)
Culture: NO GROWTH
Culture: NO GROWTH

## 2019-07-06 NOTE — Progress Notes (Signed)
Pharmacist Chemotherapy Monitoring - Follow Up Assessment    I verify that I have reviewed each item in the below checklist:  . Regimen for the patient is scheduled for the appropriate day and plan matches scheduled date. Marland Kitchen Appropriate non-routine labs are ordered dependent on drug ordered. . If applicable, additional medications reviewed and ordered per protocol based on lifetime cumulative doses and/or treatment regimen.   Plan for follow-up and/or issues identified: No . I-vent associated with next due treatment: Yes . MD and/or nursing notified: No  Adelina Mings 07/06/2019 8:36 AM

## 2019-07-08 NOTE — Progress Notes (Signed)
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    INTERVAL HISTORY: Patient returns to clinic today for further evaluation, hospital follow-up, and consideration of cycle 4 of carboplatinum, pemetrexed, and pembrolizumab. He was discharged from the hospital approximately 2 weeks ago with abdominal swelling and colitis. Patient still has significant abdominal distention as well as 2-3+ bilateral lower extremity pitting edema. He is unable to say whether his symptoms improved and now it becomes worse or there has been no change in the past several weeks. He otherwise feels well.  He has no neurologic complaints.  He denies any recent fevers or illnesses.  He has a fair appetite, but denies weight loss.  He denies any pain.  He has no chest pain, shortness of breath, cough, or hemoptysis.  He denies any nausea, vomiting, constipation, or diarrhea.  He has no urinary complaints. Patient offers no further specific complaints today.  REVIEW OF SYSTEMS:    Review of Systems  Constitutional: Negative.  Negative for fever, malaise/fatigue and weight loss.  HENT: Negative.  Negative for sore throat.   Respiratory: Negative.  Negative for cough and shortness of breath.   Cardiovascular: Positive for leg swelling. Negative for chest pain.  Gastrointestinal: Negative.  Negative for abdominal pain.  Genitourinary: Negative.  Negative for dysuria.  Musculoskeletal: Negative.  Negative for back pain.  Skin: Negative.  Negative for rash.  Neurological: Negative.  Negative for dizziness, sensory change, focal weakness, weakness and headaches.    Psychiatric/Behavioral: Negative.  Negative for substance abuse. The patient does not have insomnia.     As per HPI. Otherwise, a complete review of systems is negative.  PAST MEDICAL HISTORY: Past Medical History:  Diagnosis Date  . Alcohol abuse   . Cirrhosis ([place])   . COPD (chronic obstructive pulmonary disease) ([place])   . Dementia ([place])   . Hepatitis C   . Malnutrition ([place])   . Throat cancer ([place])     PAST SURGICAL HISTORY: Past Surgical History:  Procedure Laterality Date  . IR IMAGING GUIDED PORT INSERTION  04/28/2019  . TONSILLECTOMY     age was 16  . VASECTOMY      FAMILY HISTORY: Family History  Problem Relation Age of Onset  . Breast cancer Mother     ADVANCED DIRECTIVES (Y/N):  N  HEALTH MAINTENANCE: Social History   Tobacco Use  . Smoking status: Current Every Day Smoker    Packs/day: 0.50    Years: 63.00    Pack years: 31.50    Types: Cigarettes  . Smokeless tobacco: Never Used  Substance Use Topics  . Alcohol use: Not Currently    Alcohol/week: 1.0 standard drinks    Types: 1 Cans of beer per week  . Drug use: No     Colonoscopy:  PAP:  Bone density:  Lipid panel:  Allergies  Allergen Reactions  . Penicillins Other (See Comments)    Reaction: unknown Patient is not able to answer follow up questions    Current Outpatient Medications  Medication Sig Dispense Refill  . alum & mag hydroxide-simeth (MAALOX/MYLANTA) 200-200-20 MG/5ML suspension Take 15 mLs by mouth as needed for indigestion or heartburn. 355 mL  0  . buPROPion (WELLBUTRIN SR) 150 MG 12 hr tablet bupropion HCl SR 150 mg tablet,12 hr sustained-release    . cefdinir (OMNICEF) 300 MG capsule Take 1 capsule (300 mg total) by mouth 2 (two) times daily. 6 capsule 0  . Cholecalciferol (VITAMIN D3) 5000 units TABS Take 5,000 Units by mouth daily.    Marland Kitchen docusate (COLACE) 50 MG/5ML liquid Take 100 mg by mouth daily.     . ferrous sulfate 325 (65 FE) MG tablet Take 325 mg by mouth  daily with breakfast.    . folic acid (FOLVITE) 1 MG tablet Take 1 mg by mouth daily.    . hydrOXYzine (ATARAX/VISTARIL) 25 MG tablet Take 100 mg by mouth at bedtime.     . lactose free nutrition (BOOST PLUS) LIQD Take 237 mLs by mouth 4 (four) times daily.    Marland Kitchen levothyroxine (SYNTHROID) 100 MCG tablet Take 100 mcg by mouth daily before breakfast.    . lidocaine-prilocaine (EMLA) cream Apply to affected area once 30 g 3  . losartan (COZAAR) 25 MG tablet Take 25 mg by mouth daily.    . methylPREDNISolone (MEDROL) 4 MG tablet Take methylprednisolone 4 mg (1 tab) four times daily for 1 day then take '4mg'$  (1 tab) three times daily for 1 day then take 4 mg (1 tab) two times daily for 1 day then take 4 mg (1 tab) once daily for 1 day then stop 10 tablet 0  . mirtazapine (REMERON) 15 MG tablet Take 15 mg by mouth at bedtime.    . mupirocin ointment (BACTROBAN) 2 % Apply 1 application topically 2 (two) times daily.    . ondansetron (ZOFRAN) 8 MG tablet Take by mouth every 8 (eight) hours as needed for nausea or vomiting.    . polyethylene glycol (MIRALAX / GLYCOLAX) 17 g packet Take 17 g by mouth daily.    . prochlorperazine (COMPAZINE) 10 MG tablet Take 1 tablet (10 mg total) by mouth every 6 (six) hours as needed (Nausea or vomiting). 60 tablet 2  . QUEtiapine (SEROQUEL) 25 MG tablet Take 25 mg by mouth 3 (three) times daily.    . QUEtiapine (SEROQUEL) 50 MG tablet Take 1 tablet (50 mg total) by mouth at bedtime. (Patient taking differently: Take 50 mg by mouth at bedtime. Takes with a 25 mg tablet) 30 tablet 0  . tamsulosin (FLOMAX) 0.4 MG CAPS capsule Take 0.8 mg by mouth daily after breakfast.     . thiamine 100 MG tablet Take 100 mg by mouth daily.    . TRELEGY ELLIPTA 100-62.5-25 MCG/INH AEPB Inhale 1 puff into the lungs in the morning.     . vitamin C (ASCORBIC ACID) 250 MG tablet Take 500 mg by mouth daily.     . Vitamin D, Ergocalciferol, (DRISDOL) 1.25 MG (50000 UNIT) CAPS capsule Take 50,000  Units by mouth every 7 (seven) days.     No current facility-administered medications for this visit.   Facility-Administered Medications Ordered in Other Visits  Medication Dose Route Frequency Provider Last Rate Last Admin  . heparin lock flush 100 unit/mL  500 Units Intravenous Once Lloyd Huger, MD      . sodium chloride flush (NS) 0.9 % injection 10 mL  10 mL Intravenous PRN Lloyd Huger, MD   10 mL at 07/13/19 0938    OBJECTIVE: Vitals:   07/13/19 0958  BP: 137/71  Pulse: 100  Resp: 20  Temp: (!) 97.4 F (36.3 C)  SpO2: 100%  Body mass index is 23.65 kg/m.    ECOG FS:0 - Asymptomatic  General: Thin, no acute distress. Eyes: Pink conjunctiva, anicteric sclera. HEENT: Normocephalic, moist mucous membranes. Lungs: No audible wheezing or coughing. Heart: Regular rate and rhythm. Abdomen: Distended, nontender. Musculoskeletal: 3+ bilateral lower extremity pitting edema. Neuro: Alert, answering all questions appropriately. Cranial nerves grossly intact. Skin: No rashes or petechiae noted. Psych: Normal affect.   LAB RESULTS:  Lab Results  Component Value Date   NA 134 (L) 07/13/2019   K 4.2 07/13/2019   CL 103 07/13/2019   CO2 25 07/13/2019   GLUCOSE 134 (H) 07/13/2019   BUN 12 07/13/2019   CREATININE 0.91 07/13/2019   CALCIUM 8.3 (L) 07/13/2019   PROT 6.0 (L) 07/13/2019   ALBUMIN 2.1 (L) 07/13/2019   AST 54 (H) 07/13/2019   ALT 33 07/13/2019   ALKPHOS 103 07/13/2019   BILITOT 0.9 07/13/2019   GFRNONAA >60 07/13/2019   GFRAA >60 07/13/2019    Lab Results  Component Value Date   WBC 4.6 07/13/2019   NEUTROABS 2.9 07/13/2019   HGB 8.0 (L) 07/13/2019   HCT 23.3 (L) 07/13/2019   MCV 98.3 07/13/2019   PLT 137 (L) 07/13/2019     STUDIES: CT Head Wo Contrast  Result Date: 06/27/2019 CLINICAL DATA:  Hypotension, altered level of consciousness, history of head and neck and lung cancer, cirrhosis, dementia EXAM: CT HEAD WITHOUT CONTRAST  TECHNIQUE: Contiguous axial images were obtained from the base of the skull through the vertex without intravenous contrast. 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 No signs of acute infarct or hemorrhage. Lateral ventricles and midline structures are stable. No acute extra-axial fluid collections. Chronic dural thickening right frontal para falcine region, stable. No mass effect. Vascular: No hyperdense vessel or unexpected calcification. Skull: Normal. Negative for fracture or focal lesion. Sinuses/Orbits: Mild mucosal thickening throughout the ethmoid and maxillary sinuses. Other: None. IMPRESSION: 1. Chronic ischemic changes, no acute process. Electronically Signed   By: Randa Ngo M.D.   On: 06/27/2019 00:49   CT Abdomen Pelvis W Contrast  Result Date: 06/27/2019 CLINICAL DATA:  Abdominal distension, hypotensive EXAM: CT ABDOMEN AND PELVIS WITH CONTRAST TECHNIQUE: Multidetector CT imaging of the abdomen and pelvis was performed using the standard protocol following bolus administration of intravenous contrast. CONTRAST:  181m OMNIPAQUE IOHEXOL 300 MG/ML  SOLN COMPARISON:  March 30, 2019 FINDINGS: Lower chest: The visualized heart size within normal limits. There is a trace pericardial effusion. A small right pleural effusion is present. Coarse calcifications are seen along both lung bases. Hepatobiliary: There is a shrunken nodular contour to the liver parenchyma.The main portal vein is patent. Layering calcified gallstones are present. Pancreas: Unremarkable. No pancreatic ductal dilatation or surrounding inflammatory changes. Spleen: Normal in size without focal abnormality. Adrenals/Urinary Tract: Both adrenal glands appear normal. There is a small 1 cm low-density lesion seen within the upper pole of the right kidney. The left kidney is unremarkable. Bladder is unremarkable. Stomach/Bowel: The  stomach and small bowel are grossly unremarkable. There is significant wall thickening and surrounding fat stranding changes seen predominantly around the right colon and cecum as well as the transverse colon and proximal descending colon. There is bubbly foci air within the right upper quadrant, likely thought to be within the cecum, however on series 2, image 37 there are 2 foci of air which could be extraluminal. No definite loculated fluid collections are seen. There scattered  colonic diverticulosis.The patient is status post appendectomy. Vascular/Lymphatic: There are no enlarged mesenteric, retroperitoneal, or pelvic lymph nodes. Scattered aortic atherosclerotic calcifications are seen without aneurysmal dilatation. Reproductive: The prostate is unremarkable. Other: Diffuse mild mesenteric edema is seen throughout. A small amount of deep pelvic ascites is present. Musculoskeletal: No acute or significant osseous findings. IMPRESSION: 1. Findings consistent with extensive acute colitis involving the right colon, cecum, transverse colon and proximal descending colon. This is most significant around the cecum with question a small foci of extraluminal air suggestive of perforation as described above. No definite loculated fluid collections are seen. 2. Small amount of deep pelvic ascites. 3. Small right pleural effusion and trace pericardial effusion 4. Findings suggestive of cirrhosis 5. Cholelithiasis 6.  Aortic Atherosclerosis (ICD10-I70.0). 7. These results were called by telephone at the time of interpretation on 06/27/2019 at 1:02 am to provider JADE SUNG , who verbally acknowledged these results. Electronically Signed   By: Prudencio Pair M.D.   On: 06/27/2019 01:03   DG Chest Port 1 View  Result Date: 06/27/2019 CLINICAL DATA:  Confusion EXAM: PORTABLE CHEST 1 VIEW COMPARISON:  April 12, 2019 FINDINGS: The heart size and mediastinal contours are within normal limits. Aortic knob calcifications are  seen. Again noted is a spiculated nodular opacity within the right upper lung. There is prominence of the central pulmonary vasculature. There is mildly increased interstitial markings seen throughout both lungs as on the prior exam. No pleural effusion is seen. No acute osseous abnormality. A right-sided MediPort catheter seen with the tip at the superior cavoatrial junction. IMPRESSION: Spiculated nodular opacity at the right upper lung, consistent with the patient's known pulmonary mass Mildly increased interstitial markings throughout both lungs as on prior exam which may be due to chronic lung changes Electronically Signed   By: Prudencio Pair M.D.   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 PD-L1 10%, with high tumor mutational burden.  PLAN:    1.  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  Patient has a new left acetabular metastasis confirming stage IV disease.  MRI of the brain on May 01, 2019 did not reveal metastatic disease.  Patient has completed palliative XRT to his acetabulum.  Plan to give carboplatinum, pemetrexed, and Keytruda every 3 weeks for 4 cycles, patient will then maintenance pemetrexed and Keytruda. Delay cycle 4 of treatment today secondary to increased abdominal distention and peripheral edema. Will get stat CT scan to assess interval change of imaging several weeks ago when patient had active colitis. Follow-up will be based on CT results. 2. 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  Patient received his last dose of weekly cisplatin on May 04, 2016.  3.  Social situation: Patient has a legal guardian. 4.  Fatigue: Resolved. 5.  Neutropenia: Resolved. Patient will  require a Udenyca for the remainder of his treatments. 6.  Anemia: Patient hemoglobin remains decreased, at 8.0, but relatively stable. Monitor. 7.  Elevated liver enzymes: Patient has a mildly elevated AST, monitor. 8. Abdominal distention/peripheral edema: CT scan as above.  Patient expressed understanding and was in agreement with this plan. He also understands that He can call clinic at any time with any questions, concerns, or  complaints.    Cancer Staging Adenocarcinoma, lung, right [facility]) Staging form: Lung, AJCC 8th Edition - Clinical stage from 05/02/2019: Stage IVB (cT1c, cN2, cM1c) - Signed by Lloyd Huger, MD on 05/02/2019  Primary cancer of larynx [facility]) Staging form: Larynx - Supraglottis, AJCC 8th Edition - Clinical stage from 02/27/2016: Stage IVA (cT1, cN2b, cM0) - Signed by Lloyd Huger, MD on 02/27/2016   Lloyd Huger, MD 07/13/19 10:20 AM

## 2019-07-12 ENCOUNTER — Other Ambulatory Visit: Payer: Self-pay

## 2019-07-12 ENCOUNTER — Encounter: Payer: Self-pay | Admitting: Oncology

## 2019-07-12 ENCOUNTER — Telehealth: Payer: Self-pay | Admitting: *Deleted

## 2019-07-12 NOTE — Progress Notes (Signed)
Patient comes in reporting abd pain that is "like pressure", abd is markedly distended and tight. Also has +2 to +3 pitting edema to bilateral lower extremities up to knees, possible higher, however jeans make it harder to assess.

## 2019-07-12 NOTE — Telephone Encounter (Signed)
Pt having increased swelling in both feet after discharged from the hospital. Pt is asking if he can get a diuretic prescribed to help with the swelling. Informed Madelynn Done that Dr. Grayland Ormond can evaluate his feet at his appt tomorrow morning and determine what would be best to help with his swelling. Clement verbalized understanding.

## 2019-07-13 ENCOUNTER — Inpatient Hospital Stay: Payer: Medicare Other

## 2019-07-13 ENCOUNTER — Other Ambulatory Visit: Payer: Self-pay | Admitting: Emergency Medicine

## 2019-07-13 ENCOUNTER — Ambulatory Visit
Admission: RE | Admit: 2019-07-13 | Discharge: 2019-07-13 | Disposition: A | Discharge status: Home / Self Care | Payer: Medicare Other | Source: Ambulatory Visit | Attending: Oncology | Admitting: Oncology

## 2019-07-13 ENCOUNTER — Inpatient Hospital Stay (HOSPITAL_BASED_OUTPATIENT_CLINIC_OR_DEPARTMENT_OTHER): Payer: Medicare Other | Admitting: Oncology

## 2019-07-13 ENCOUNTER — Ambulatory Visit: Payer: Medicare Other

## 2019-07-13 ENCOUNTER — Inpatient Hospital Stay: Payer: Medicare Other | Admitting: Hospice and Palliative Medicine

## 2019-07-13 VITALS — BP 137/71 | HR 100 | Temp 97.4°F | Resp 20 | Wt 146.5 lb

## 2019-07-13 DIAGNOSIS — C3491 Malignant neoplasm of unspecified part of right bronchus or lung: Secondary | ICD-10-CM

## 2019-07-13 DIAGNOSIS — D649 Anemia, unspecified: Secondary | ICD-10-CM | POA: Diagnosis not present

## 2019-07-13 DIAGNOSIS — K7460 Unspecified cirrhosis of liver: Secondary | ICD-10-CM | POA: Diagnosis not present

## 2019-07-13 DIAGNOSIS — Z5111 Encounter for antineoplastic chemotherapy: Secondary | ICD-10-CM | POA: Diagnosis not present

## 2019-07-13 DIAGNOSIS — Z5112 Encounter for antineoplastic immunotherapy: Secondary | ICD-10-CM | POA: Diagnosis present

## 2019-07-13 DIAGNOSIS — C3411 Malignant neoplasm of upper lobe, right bronchus or lung: Secondary | ICD-10-CM | POA: Diagnosis not present

## 2019-07-13 DIAGNOSIS — Z95828 Presence of other vascular implants and grafts: Secondary | ICD-10-CM

## 2019-07-13 DIAGNOSIS — Z79899 Other long term (current) drug therapy: Secondary | ICD-10-CM | POA: Diagnosis not present

## 2019-07-13 DIAGNOSIS — C329 Malignant neoplasm of larynx, unspecified: Secondary | ICD-10-CM | POA: Diagnosis not present

## 2019-07-13 DIAGNOSIS — F17210 Nicotine dependence, cigarettes, uncomplicated: Secondary | ICD-10-CM | POA: Diagnosis not present

## 2019-07-13 DIAGNOSIS — J449 Chronic obstructive pulmonary disease, unspecified: Secondary | ICD-10-CM | POA: Diagnosis not present

## 2019-07-13 DIAGNOSIS — C7951 Secondary malignant neoplasm of bone: Secondary | ICD-10-CM | POA: Diagnosis not present

## 2019-07-13 DIAGNOSIS — R748 Abnormal levels of other serum enzymes: Secondary | ICD-10-CM | POA: Diagnosis not present

## 2019-07-13 LAB — COMPREHENSIVE METABOLIC PANEL
ALT: 33 U/L (ref 0–44)
AST: 54 U/L — ABNORMAL HIGH (ref 15–41)
Albumin: 2.1 g/dL — ABNORMAL LOW (ref 3.5–5.0)
Alkaline Phosphatase: 103 U/L (ref 38–126)
Anion gap: 6 (ref 5–15)
BUN: 12 mg/dL (ref 8–23)
CO2: 25 mmol/L (ref 22–32)
Calcium: 8.3 mg/dL — ABNORMAL LOW (ref 8.9–10.3)
Chloride: 103 mmol/L (ref 98–111)
Creatinine, Ser: 0.91 mg/dL (ref 0.61–1.24)
GFR calc Af Amer: >60 mL/min (ref >60)
GFR calc non Af Amer: >60 mL/min (ref >60)
Glucose, Bld: 134 mg/dL — ABNORMAL HIGH (ref 70–99)
Potassium: 4.2 mmol/L (ref 3.5–5.1)
Sodium: 134 mmol/L — ABNORMAL LOW (ref 135–145)
Total Bilirubin: 0.9 mg/dL (ref 0.3–1.2)
Total Protein: 6 g/dL — ABNORMAL LOW (ref 6.5–8.1)

## 2019-07-13 LAB — CBC WITH DIFFERENTIAL/PLATELET
Abs Immature Granulocytes: 0.07 10*3/uL (ref 0.00–0.07)
Basophils Absolute: 0 10*3/uL (ref 0.0–0.1)
Basophils Relative: 0 %
Eosinophils Absolute: 0 10*3/uL (ref 0.0–0.5)
Eosinophils Relative: 0 %
HCT: 23.3 % — ABNORMAL LOW (ref 39.0–52.0)
Hemoglobin: 8 g/dL — ABNORMAL LOW (ref 13.0–17.0)
Immature Granulocytes: 2 %
Lymphocytes Relative: 15 %
Lymphs Abs: 0.7 10*3/uL (ref 0.7–4.0)
MCH: 33.8 pg (ref 26.0–34.0)
MCHC: 34.3 g/dL (ref 30.0–36.0)
MCV: 98.3 fL (ref 80.0–100.0)
Monocytes Absolute: 0.9 10*3/uL (ref 0.1–1.0)
Monocytes Relative: 19 %
Neutro Abs: 2.9 10*3/uL (ref 1.7–7.7)
Neutrophils Relative %: 64 %
Platelets: 137 10*3/uL — ABNORMAL LOW (ref 150–400)
RBC: 2.37 MIL/uL — ABNORMAL LOW (ref 4.22–5.81)
RDW: 22.2 % — ABNORMAL HIGH (ref 11.5–15.5)
WBC: 4.6 10*3/uL (ref 4.0–10.5)
nRBC: 0 % (ref 0.0–0.2)

## 2019-07-13 LAB — TSH: TSH: 14.926 u[IU]/mL — ABNORMAL HIGH (ref 0.350–4.500)

## 2019-07-13 MED ORDER — HEPARIN SOD (PORK) LOCK FLUSH 100 UNIT/ML IV SOLN
INTRAVENOUS | Status: AC
  Filled 2019-07-13: qty 5

## 2019-07-13 MED ORDER — SODIUM CHLORIDE 0.9% FLUSH
10.0000 mL | INTRAVENOUS | Status: DC | PRN
  Administered 2019-07-13: 10 mL via INTRAVENOUS
  Filled 2019-07-13: qty 10

## 2019-07-13 MED ORDER — IOHEXOL 300 MG/ML  SOLN
100.0000 mL | Freq: Once | INTRAMUSCULAR | Status: AC | PRN
  Administered 2019-07-13: 100 mL via INTRAVENOUS

## 2019-07-14 ENCOUNTER — Telehealth: Payer: Self-pay

## 2019-07-14 ENCOUNTER — Other Ambulatory Visit: Payer: Self-pay

## 2019-07-14 ENCOUNTER — Ambulatory Visit
Admission: RE | Admit: 2019-07-14 | Discharge: 2019-07-14 | Disposition: A | Discharge status: Home / Self Care | Payer: Medicare Other | Source: Ambulatory Visit | Attending: Oncology | Admitting: Oncology

## 2019-07-14 DIAGNOSIS — R188 Other ascites: Secondary | ICD-10-CM | POA: Diagnosis present

## 2019-07-14 DIAGNOSIS — K7460 Unspecified cirrhosis of liver: Secondary | ICD-10-CM | POA: Insufficient documentation

## 2019-07-14 DIAGNOSIS — C3491 Malignant neoplasm of unspecified part of right bronchus or lung: Secondary | ICD-10-CM | POA: Diagnosis not present

## 2019-07-14 LAB — T4: T4, Total: 7 ug/dL (ref 4.5–12.0)

## 2019-07-14 NOTE — Procedures (Signed)
Ultrasound-guided  therapeutic paracentesis performed yielding 3.9  liters of light yellow fluid. No immediate complications. EBL none.

## 2019-07-14 NOTE — Telephone Encounter (Signed)
Patient was offered appointment to have paracentesis today at 10 am. Patient accepted appointment.

## 2019-07-14 NOTE — Telephone Encounter (Signed)
-----   Message from Lloyd Huger, MD sent at 07/13/2019  4:14 PM EDT ----- Arloa Koh get an u/s guided paracentesis asap and urgent GI referral.   ----- Message ----- From: Interface, Rad Results In Sent: 07/13/2019   1:22 PM EDT To: Lloyd Huger, MD

## 2019-07-18 ENCOUNTER — Telehealth: Payer: Self-pay

## 2019-07-18 ENCOUNTER — Telehealth: Payer: Self-pay | Admitting: *Deleted

## 2019-07-18 ENCOUNTER — Other Ambulatory Visit: Payer: Self-pay | Admitting: Oncology

## 2019-07-18 DIAGNOSIS — K7460 Unspecified cirrhosis of liver: Secondary | ICD-10-CM

## 2019-07-18 DIAGNOSIS — R188 Other ascites: Secondary | ICD-10-CM

## 2019-07-18 NOTE — Telephone Encounter (Signed)
Dr. Georgeann Oppenheim office was supposed to get him in today to be evaluated. When did you talk to them [facility]?

## 2019-07-18 NOTE — Progress Notes (Signed)
Re: Referral for GI    Spoke to GI Dr. Vicente Males for urgent referral.   Faythe Casa, NP 07/18/2019 2:26 PM

## 2019-07-18 NOTE — Progress Notes (Signed)
RE: Paracentesis  Abdominal bloating-orders placed for US guided paracentesis.   Faythe Casa, NP 07/18/2019 2:30 PM

## 2019-07-18 NOTE — Telephone Encounter (Signed)
Per pt's caregiver, pt is complaining of bloating and feeling that his stomach is very tight. Please advise.

## 2019-07-18 NOTE — Telephone Encounter (Signed)
I think Sonia Baller got him an appt with GI to further evaluate.

## 2019-07-18 NOTE — Telephone Encounter (Signed)
Pt's caregiver left me a message today.

## 2019-07-18 NOTE — Telephone Encounter (Signed)
John Casa NP requested stat paracentesis be ordered on [name] due to bloating. Barbara from scheduling called and offered [name] an appointment tomorrow at 8 am with an arrival time of 7:45 am.   Called [name] and [name]'s guardian and relayed information. Guardian accepted appointment and stated he would have [name] here by 7:45 am [facility].

## 2019-07-19 ENCOUNTER — Ambulatory Visit
Admission: RE | Admit: 2019-07-19 | Discharge: 2019-07-19 | Disposition: A | Discharge status: Home / Self Care | Payer: Medicare Other | Source: Ambulatory Visit | Attending: Oncology | Admitting: Oncology

## 2019-07-19 ENCOUNTER — Other Ambulatory Visit: Payer: Self-pay

## 2019-07-19 ENCOUNTER — Other Ambulatory Visit: Payer: Self-pay | Admitting: Oncology

## 2019-07-19 DIAGNOSIS — K7460 Unspecified cirrhosis of liver: Secondary | ICD-10-CM

## 2019-07-19 DIAGNOSIS — R188 Other ascites: Secondary | ICD-10-CM | POA: Insufficient documentation

## 2019-07-19 NOTE — Progress Notes (Signed)
He did not need a Paracentesis today. Just FYI. He should have GI appt today with Dr. Vicente Males.   Faythe Casa, NP 07/19/2019 10:01 AM

## 2019-07-20 ENCOUNTER — Ambulatory Visit (INDEPENDENT_AMBULATORY_CARE_PROVIDER_SITE_OTHER): Payer: Medicare Other | Admitting: Gastroenterology

## 2019-07-20 ENCOUNTER — Other Ambulatory Visit: Payer: Self-pay

## 2019-07-20 VITALS — BP 188/99 | HR 69 | Temp 98.1°F | Ht 66.0 in | Wt 135.2 lb

## 2019-07-20 DIAGNOSIS — R188 Other ascites: Secondary | ICD-10-CM | POA: Diagnosis not present

## 2019-07-20 DIAGNOSIS — K769 Liver disease, unspecified: Secondary | ICD-10-CM

## 2019-07-20 DIAGNOSIS — K7460 Unspecified cirrhosis of liver: Secondary | ICD-10-CM | POA: Diagnosis not present

## 2019-07-20 MED ORDER — SPIRONOLACTONE 50 MG PO TABS
50.0000 mg | ORAL_TABLET | Freq: Every day | ORAL | 1 refills | Status: DC
Start: 2019-07-20 — End: 2020-01-18

## 2019-07-20 MED ORDER — FUROSEMIDE 40 MG PO TABS
40.0000 mg | ORAL_TABLET | Freq: Every day | ORAL | 0 refills | Status: DC
Start: 2019-07-20 — End: 2019-08-22

## 2019-07-20 NOTE — Progress Notes (Signed)
[name] MD, MRCP(U.K) [address]  [place]  [place], [place] [address]  Main: [phone]  Fax: [phone]   Gastroenterology Consultation  Referring Provider:   Dr. Grayland Ormond  primary Care Physician:  Remi Haggard, FNP Primary Gastroenterologist:  Dr. [name]  Reason for Consultation:     Liver cirrhosis        HPI:   [name] is a 74 y.o. y/o male was previously managed by [facility] gastroenterology for liver cirrhosis, chronic hepatitis C, alcohol abuse..  Last office visit was in May 2017 where he had child Pugh a cirrhosis.  At that point of time they are considering treatment for hepatitis C.  Subsequently I cannot see any follow-up.  EGD was contemplated but not performed at that time.  Presently follows with Dr. 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  At that time had abdominal distention.  Undergoing chemotherapy.  Underwent outpatient paracentesis I cannot see any fluid analysis been performed  07/13/2019 TSH elevated at 14.9, T4 normal.  Albumin 2.1, creatinine 0.91, sodium 134, total bilirubin 0.9, hemoglobin 8 g with an MCV of 98.3 and platelet count of 137.  Ultrasound performed 07/19/2019 showed small volume of ascites.  Ultrasound of the abdomen with paracentesis performed on 07/14/2019 at 3.9 L taken off.  CT scan of the abdomen on 07/13/2019 demonstrated features of cirrhosis and portal hypertension. He was accompanied to the office with a caregiver.  Apparently he does not take his own decisions and its taken for him by DSS.  Not treated for hepatitis C.  X excess alcohol consumption.  Denies any abdominal pain or discomfort presently.  MELD-Na score: 19 at 06/28/2019  6:35 AM MELD score: 16 at 06/28/2019  6:35 AM Calculated from: Serum Creatinine: 0.99 mg/dL (Rounded to 1 mg/dL) at 06/28/2019  6:35 AM Serum Sodium: 133 mmol/L at 06/28/2019  6:35 AM Total Bilirubin: 2.5 mg/dL at 06/27/2019  3:35 PM INR(ratio): 1.7 at  06/26/2019 11:53 PM Age: 36 years 3 months   Past Medical History:  Diagnosis Date  . Alcohol abuse   . Cirrhosis ([place])   . COPD (chronic obstructive pulmonary disease) ([place])   . Dementia ([place])   . Hepatitis C   . Malnutrition ([place])   . Throat cancer [facility])     Past Surgical History:  Procedure Laterality Date  . IR IMAGING GUIDED PORT INSERTION  04/28/2019  . TONSILLECTOMY     age was 86  . VASECTOMY      Prior to Admission medications   Medication Sig Start Date End Date Taking? Authorizing Provider  alum & mag hydroxide-simeth (MAALOX/MYLANTA) 200-200-20 MG/5ML suspension Take 15 mLs by mouth as needed for indigestion or heartburn. 05/15/16   Gouru, Illene Silver, MD  buPROPion (WELLBUTRIN SR) 150 MG 12 hr tablet bupropion HCl SR 150 mg tablet,12 hr sustained-release    [provider]  cefdinir (OMNICEF) 300 MG capsule Take 1 capsule (300 mg total) by mouth 2 (two) times daily. 06/30/19   Danford, Suann Larry, MD  Cholecalciferol (VITAMIN D3) 5000 units TABS Take 5,000 Units by mouth daily.    [provider]  docusate (COLACE) 50 MG/5ML liquid Take 100 mg by mouth daily.     [provider]  ferrous sulfate 325 (65 FE) MG tablet Take 325 mg by mouth daily with breakfast.    [provider]  folic acid (FOLVITE) 1 MG tablet Take 1 mg by mouth daily.  [provider]  hydrOXYzine (ATARAX/VISTARIL) 25 MG tablet Take 100 mg by mouth at bedtime.  02/16/19   [provider]  lactose free nutrition (BOOST PLUS) LIQD Take 237 mLs by mouth 4 (four) times daily.    [provider]  levothyroxine (SYNTHROID) 100 MCG tablet Take 100 mcg by mouth daily before breakfast.    [provider]  lidocaine-prilocaine (EMLA) cream Apply to affected area once 05/02/19   Lloyd Huger, MD  losartan (COZAAR) 25 MG tablet Take 25 mg by mouth daily. 03/20/19   [provider]  methylPREDNISolone (MEDROL) 4 MG tablet Take  methylprednisolone 4 mg (1 tab) four times daily for 1 day then take 4mg  (1 tab) three times daily for 1 day then take 4 mg (1 tab) two times daily for 1 day then take 4 mg (1 tab) once daily for 1 day then stop 06/30/19   Danford, Suann Larry, MD  mirtazapine (REMERON) 15 MG tablet Take 15 mg by mouth at bedtime. 03/20/19   [provider]  mupirocin ointment (BACTROBAN) 2 % Apply 1 application topically 2 (two) times daily.    [provider]  ondansetron (ZOFRAN) 8 MG tablet Take by mouth every 8 (eight) hours as needed for nausea or vomiting.    [provider]  polyethylene glycol (MIRALAX / GLYCOLAX) 17 g packet Take 17 g by mouth daily.    [provider]  prochlorperazine (COMPAZINE) 10 MG tablet Take 1 tablet (10 mg total) by mouth every 6 (six) hours as needed (Nausea or vomiting). 05/02/19   Lloyd Huger, MD  QUEtiapine (SEROQUEL) 25 MG tablet Take 25 mg by mouth 3 (three) times daily.    [provider]  QUEtiapine (SEROQUEL) 50 MG tablet Take 1 tablet (50 mg total) by mouth at bedtime. Patient taking differently: Take 50 mg by mouth at bedtime. Takes with a 25 mg tablet 05/26/16   Bettey Costa, MD  tamsulosin (FLOMAX) 0.4 MG CAPS capsule Take 0.8 mg by mouth daily after breakfast.  10/14/15   [provider]  thiamine 100 MG tablet Take 100 mg by mouth daily.    [provider]  TRELEGY ELLIPTA 100-62.5-25 MCG/INH AEPB Inhale 1 puff into the lungs in the morning.  02/28/19   [provider]  vitamin C (ASCORBIC ACID) 250 MG tablet Take 500 mg by mouth daily.     [provider]  Vitamin D, Ergocalciferol, (DRISDOL) 1.25 MG (50000 UNIT) CAPS capsule Take 50,000 Units by mouth every 7 (seven) days.    [provider]    Family History  Problem Relation Age of Onset  . Breast cancer Mother      Social History   Tobacco Use  . Smoking status: Current Every Day Smoker    Packs/day: 0.50     Years: 63.00    Pack years: 31.50    Types: Cigarettes  . Smokeless tobacco: Never Used  Substance Use Topics  . Alcohol use: Not Currently    Alcohol/week: 1.0 standard drinks    Types: 1 Cans of beer per week  . Drug use: No    Allergies as of 07/20/2019 - Review Complete 07/13/2019  Allergen Reaction Noted  . Penicillins Other (See Comments) 08/24/2015    Review of Systems:    All systems reviewed and negative except where noted in HPI.   Physical Exam:  There were no vitals taken for this visit. No LMP for male patient. Psych:  Alert  and cooperative.  Appears comfortable General:   Alert,  Well-developed, well-nourished, pleasant and cooperative in NAD Head:  Normocephalic and atraumatic. Eyes:  Sclera clear, no icterus.   Conjunctiva pink. Lungs:  Respirations even and unlabored.  Clear throughout to auscultation.   No wheezes, crackles, or rhonchi. No acute distress. Heart:  Regular rate and rhythm; no murmurs, clicks, rubs, or gallops. Abdomen:  Normal bowel sounds.  No bruits.  Soft, non-tender and mildly distended without masses, hepatosplenomegaly or hernias noted.  No guarding or rebound tenderness.    Extremities:  No clubbing or edema.  No cyanosis. Neurologic:  Alert and oriented x3;  grossly normal neurologically. Psych:  Alert and cooperative. Normal mood and affect.  Imaging Studies: CT Head Wo Contrast  Result Date: 06/27/2019 CLINICAL DATA:  Hypotension, altered level of consciousness, history of head and neck and lung cancer, cirrhosis, dementia EXAM: CT HEAD WITHOUT CONTRAST TECHNIQUE: Contiguous axial images were obtained from the base of the skull through the vertex without intravenous contrast. 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 No signs of acute infarct or hemorrhage. Lateral ventricles and midline structures are stable. No acute extra-axial fluid  collections. Chronic dural thickening right frontal para falcine region, stable. No mass effect. Vascular: No hyperdense vessel or unexpected calcification. Skull: Normal. Negative for fracture or focal lesion. Sinuses/Orbits: Mild mucosal thickening throughout the ethmoid and maxillary sinuses. Other: None. IMPRESSION: 1. Chronic ischemic changes, no acute process. Electronically Signed   By: Randa Ngo M.D.   On: 06/27/2019 00:49   CT Abdomen Pelvis W Contrast  Result Date: 07/13/2019 CLINICAL DATA:  Stage IV B adenocarcinoma of the right upper lobe with osseous metastasis. Abdominal distension. Bilateral lower extremity edema. EXAM: CT ABDOMEN AND PELVIS WITH CONTRAST TECHNIQUE: Multidetector CT imaging of the abdomen and pelvis was performed using the standard protocol following bolus administration of intravenous contrast. CONTRAST:  125mL OMNIPAQUE IOHEXOL 300 MG/ML  SOLN COMPARISON:  06/27/2019 FINDINGS: Lower chest: Small to moderate right pleural effusion is mildly increased in volume from the previous exam. Multiple calcifications identified within the left lung base. Trace left pleural effusion, new. Hepatobiliary: Liver cirrhosis. No focal liver lesion identified. Collapsed gallbladder contains multiple calcified stones measuring up to 5 mm, image 35/2. No biliary dilatation. Pancreas: Unremarkable. No pancreatic ductal dilatation or surrounding inflammatory changes. Spleen: Normal in size without focal abnormality. Adrenals/Urinary Tract: The adrenal glands appear normal. Right upper pole kidney cyst is again noted measuring 1.3 cm. New focal area of low attenuation within the inferior pole cortex of the left kidney is noted, image 77/5. This is favored to represent either focal pyelonephritis or sequelae of infarct. Less likely but not excluded is an area of metastasis. No hydronephrosis identified bilaterally. The urinary bladder is unremarkable. Stomach/Bowel: Stomach is normal. No small  bowel wall thickening, inflammation, or distension. Interval improvement in diffuse colonic wall thickening. Extensive sigmoid diverticular change without acute inflammation. Vascular/Lymphatic: Aortic atherosclerosis without aneurysm. Recanalization of the umbilical vein. Gastric and esophageal varices. Prominent but non pathologically enlarged retroperitoneal lymph nodes are identified, nonspecific in the setting of cirrhosis. No pelvic or inguinal adenopathy. IVC appears patent and normal in caliber. Patent appearance of iliac veins and imaged portions of the lower extremity veins. Reproductive: Prostate is unremarkable. Other: Moderate volume of abdominal and pelvic ascites is increased from previous exam. No focal fluid collections identified. Fat containing left inguinal hernia. Musculoskeletal: No acute or suspicious osseous findings.  Mild thoracolumbar degenerative disc disease. IMPRESSION: 1. Morphologic features of the liver cirrhosis. Stigmata of portal venous hypertension including gastric and esophageal varices with increased volume of abdominal and pelvic ascites. 2. New focal area of low attenuation within the inferior pole cortex of the left kidney is noted. This is favored to represent either focal pyelonephritis or sequelae of infarct. Less likely, but not excluded, would be an area of metastasis. Attention in this area on follow-up imaging is advised. 3. Aortic atherosclerosis. 4. Bilateral pleural effusions, mildly increased in volume from previous exam. 5. Gallstones. Aortic Atherosclerosis (ICD10-I70.0). Electronically Signed   By: Kerby Moors M.D.   On: 07/13/2019 12:04   CT Abdomen Pelvis W Contrast  Result Date: 06/27/2019 CLINICAL DATA:  Abdominal distension, hypotensive EXAM: CT ABDOMEN AND PELVIS WITH CONTRAST TECHNIQUE: Multidetector CT imaging of the abdomen and pelvis was performed using the standard protocol following bolus administration of intravenous contrast. CONTRAST:   150mL OMNIPAQUE IOHEXOL 300 MG/ML  SOLN COMPARISON:  March 30, 2019 FINDINGS: Lower chest: The visualized heart size within normal limits. There is a trace pericardial effusion. A small right pleural effusion is present. Coarse calcifications are seen along both lung bases. Hepatobiliary: There is a shrunken nodular contour to the liver parenchyma.The main portal vein is patent. Layering calcified gallstones are present. Pancreas: Unremarkable. No pancreatic ductal dilatation or surrounding inflammatory changes. Spleen: Normal in size without focal abnormality. Adrenals/Urinary Tract: Both adrenal glands appear normal. There is a small 1 cm low-density lesion seen within the upper pole of the right kidney. The left kidney is unremarkable. Bladder is unremarkable. Stomach/Bowel: The stomach and small bowel are grossly unremarkable. There is significant wall thickening and surrounding fat stranding changes seen predominantly around the right colon and cecum as well as the transverse colon and proximal descending colon. There is bubbly foci air within the right upper quadrant, likely thought to be within the cecum, however on series 2, image 37 there are 2 foci of air which could be extraluminal. No definite loculated fluid collections are seen. There scattered colonic diverticulosis.The patient is status post appendectomy. Vascular/Lymphatic: There are no enlarged mesenteric, retroperitoneal, or pelvic lymph nodes. Scattered aortic atherosclerotic calcifications are seen without aneurysmal dilatation. Reproductive: The prostate is unremarkable. Other: Diffuse mild mesenteric edema is seen throughout. A small amount of deep pelvic ascites is present. Musculoskeletal: No acute or significant osseous findings. IMPRESSION: 1. Findings consistent with extensive acute colitis involving the right colon, cecum, transverse colon and proximal descending colon. This is most significant around the cecum with question a small  foci of extraluminal air suggestive of perforation as described above. No definite loculated fluid collections are seen. 2. Small amount of deep pelvic ascites. 3. Small right pleural effusion and trace pericardial effusion 4. Findings suggestive of cirrhosis 5. Cholelithiasis 6.  Aortic Atherosclerosis (ICD10-I70.0). 7. These results were called by telephone at the time of interpretation on 06/27/2019 at 1:02 am to provider JADE SUNG , who verbally acknowledged these results. Electronically Signed   By: Prudencio Pair M.D.   On: 06/27/2019 01:03   US Abdomen Limited  Result Date: 07/19/2019 CLINICAL DATA:  Recurrent ascites and status post paracentesis yielding 3.9 L of fluid on 07/14/2019. EXAM: LIMITED ABDOMEN ULTRASOUND FOR ASCITES TECHNIQUE: Limited ultrasound survey for ascites was performed in all four abdominal quadrants. COMPARISON:  07/14/2019 FINDINGS: By ultrasound today, there is only a small volume of ascites in the peritoneal cavity. There was not enough fluid present today to warrant paracentesis.  IMPRESSION: Small volume of ascites in the peritoneal cavity. Paracentesis was deferred today. Electronically Signed   By: Aletta Edouard M.D.   On: 07/19/2019 09:53   US Paracentesis  Result Date: 07/14/2019 INDICATION: Patient with history of metastatic lung cancer, cirrhosis by imaging, ascites. Request made for therapeutic paracentesis. EXAM: ULTRASOUND GUIDED THERAPEUTIC PARACENTESIS MEDICATIONS: None COMPLICATIONS: None immediate. PROCEDURE: Informed written consent was obtained from the patient after a discussion of the risks, benefits and alternatives to treatment. A timeout was performed prior to the initiation of the procedure. Initial ultrasound scanning demonstrates a moderate to large amount of ascites within the right lower abdominal quadrant. The right lower abdomen was prepped and draped in the usual sterile fashion. 1% lidocaine was used for local anesthesia. Following this, a 6 Fr  Safe-T-Centesis catheter was introduced. An ultrasound image was saved for documentation purposes. The paracentesis was performed. The catheter was removed and a dressing was applied. The patient tolerated the procedure well without immediate post procedural complication. FINDINGS: A total of approximately 3.9 liters of light yellow fluid was removed. IMPRESSION: Successful ultrasound-guided therapeutic paracentesis yielding 3.9 liters of peritoneal fluid. Read by: Rowe Robert, PA-C Electronically Signed   By: Sandi Mariscal M.D.   On: 07/14/2019 11:12   DG Chest Port 1 View  Result Date: 06/27/2019 CLINICAL DATA:  Confusion EXAM: PORTABLE CHEST 1 VIEW COMPARISON:  April 12, 2019 FINDINGS: The heart size and mediastinal contours are within normal limits. Aortic knob calcifications are seen. Again noted is a spiculated nodular opacity within the right upper lung. There is prominence of the central pulmonary vasculature. There is mildly increased interstitial markings seen throughout both lungs as on the prior exam. No pleural effusion is seen. No acute osseous abnormality. A right-sided MediPort catheter seen with the tip at the superior cavoatrial junction. IMPRESSION: Spiculated nodular opacity at the right upper lung, consistent with the patient's known pulmonary mass Mildly increased interstitial markings throughout both lungs as on prior exam which may be due to chronic lung changes Electronically Signed   By: Prudencio Pair M.D.   On: 06/27/2019 00:10    Assessment and Plan:   Chidubem [name] is a 74 y.o. y/o male with history of cirrhosis of liver secondary to alcohol and hepatitis C.  Treatment nave.  Was last seen by [facility] GI back in 2017 and did not follow-up afterwards.  Presently has a diagnosis of stage IV adenocarcinoma of the lung.  Has been receiving paracentesis by oncology so far.  Not established with GI.  No recent fluid analysis of the abdomen.  I have been consulted to manage his  ascites.  Plan  1.  Low-salt diet.  Information will be provided 2.  Commence on Lasix 40 and Aldactone 50 mg a day.  I will stop his losartan.  Check BMP in 3 to 5 days.  Return to the office in 2 weeks to examine abdomen.  If needed we can increase the dose of diuretics at that time.  Aim is to reduce the dependence on paracentesis. 3.  EGD to screen for esophageal varices. 4.  With advanced cirrhosis and lung cancer would suggest to discuss goals of care. 5.  TSH is elevated normal T4.  Suggest to follow-up with Remi Haggard, FNP   I have discussed alternative options, risks & benefits,  which include, but are not limited to, bleeding, infection, perforation,respiratory complication & drug reaction.  The patient agrees with this plan & written consent will be obtained.  Follow up in 2 weeks  Dr [name] MD,MRCP(U.K)

## 2019-07-20 NOTE — Addendum Note (Signed)
Addended by: Dorethea Clan on: 07/20/2019 02:46 PM   Modules accepted: Orders

## 2019-07-20 NOTE — Patient Instructions (Signed)

## 2019-07-21 ENCOUNTER — Telehealth: Payer: Self-pay | Admitting: Gastroenterology

## 2019-07-21 NOTE — Telephone Encounter (Signed)
Debbie from [facility] LVM [phone] checking on a Discontinuation of a drug. Message was sent to Provider.

## 2019-07-24 ENCOUNTER — Telehealth: Payer: Self-pay

## 2019-07-24 NOTE — Telephone Encounter (Signed)
[facility] is calling about patient medication that Dr. Vicente Males prescribed. There number is [phone]

## 2019-07-25 NOTE — Telephone Encounter (Signed)
Okay please send him a letter to discontinue losartan.  CC the letter to the FNP.  Reason for discontinuation since we have started the patient on Aldactone and Lasix to avoid hyperkalemia.

## 2019-07-25 NOTE — Telephone Encounter (Signed)
Letter faxed to [facility]. Pt last office note faxed to pcp.

## 2019-08-01 ENCOUNTER — Other Ambulatory Visit
Admission: RE | Admit: 2019-08-01 | Discharge: 2019-08-01 | Disposition: A | Discharge status: Home / Self Care | Payer: Medicare Other | Source: Ambulatory Visit | Attending: Gastroenterology | Admitting: Gastroenterology

## 2019-08-01 ENCOUNTER — Other Ambulatory Visit: Payer: Self-pay

## 2019-08-01 DIAGNOSIS — Z01812 Encounter for preprocedural laboratory examination: Secondary | ICD-10-CM | POA: Diagnosis present

## 2019-08-01 DIAGNOSIS — Z20822 Contact with and (suspected) exposure to covid-19: Secondary | ICD-10-CM | POA: Diagnosis not present

## 2019-08-02 ENCOUNTER — Ambulatory Visit: Payer: Medicare Other | Admitting: Gastroenterology

## 2019-08-02 LAB — SARS CORONAVIRUS 2 (TAT 6-24 HRS): SARS Coronavirus 2: NEGATIVE

## 2019-08-03 ENCOUNTER — Encounter
Admission: RE | Disposition: A | Discharge status: Home / Self Care | Payer: Self-pay | Source: Home / Self Care | Attending: Gastroenterology

## 2019-08-03 ENCOUNTER — Ambulatory Visit: Payer: Medicare Other | Admitting: Anesthesiology

## 2019-08-03 ENCOUNTER — Ambulatory Visit
Admission: RE | Admit: 2019-08-03 | Discharge: 2019-08-03 | Disposition: A | Discharge status: Home / Self Care | Payer: Medicare Other | Attending: Gastroenterology | Admitting: Gastroenterology

## 2019-08-03 ENCOUNTER — Encounter: Payer: Self-pay | Admitting: Gastroenterology

## 2019-08-03 ENCOUNTER — Other Ambulatory Visit: Payer: Self-pay

## 2019-08-03 DIAGNOSIS — Z79890 Hormone replacement therapy (postmenopausal): Secondary | ICD-10-CM | POA: Insufficient documentation

## 2019-08-03 DIAGNOSIS — K766 Portal hypertension: Secondary | ICD-10-CM | POA: Insufficient documentation

## 2019-08-03 DIAGNOSIS — F17210 Nicotine dependence, cigarettes, uncomplicated: Secondary | ICD-10-CM | POA: Insufficient documentation

## 2019-08-03 DIAGNOSIS — Z79899 Other long term (current) drug therapy: Secondary | ICD-10-CM | POA: Insufficient documentation

## 2019-08-03 DIAGNOSIS — B1920 Unspecified viral hepatitis C without hepatic coma: Secondary | ICD-10-CM | POA: Diagnosis not present

## 2019-08-03 DIAGNOSIS — K3189 Other diseases of stomach and duodenum: Secondary | ICD-10-CM | POA: Insufficient documentation

## 2019-08-03 DIAGNOSIS — K7460 Unspecified cirrhosis of liver: Secondary | ICD-10-CM | POA: Diagnosis present

## 2019-08-03 DIAGNOSIS — F0390 Unspecified dementia without behavioral disturbance: Secondary | ICD-10-CM | POA: Diagnosis not present

## 2019-08-03 DIAGNOSIS — Z7951 Long term (current) use of inhaled steroids: Secondary | ICD-10-CM | POA: Insufficient documentation

## 2019-08-03 DIAGNOSIS — Z85118 Personal history of other malignant neoplasm of bronchus and lung: Secondary | ICD-10-CM | POA: Insufficient documentation

## 2019-08-03 DIAGNOSIS — J449 Chronic obstructive pulmonary disease, unspecified: Secondary | ICD-10-CM | POA: Diagnosis not present

## 2019-08-03 DIAGNOSIS — K769 Liver disease, unspecified: Secondary | ICD-10-CM

## 2019-08-03 DIAGNOSIS — Z880 Allergy status to penicillin: Secondary | ICD-10-CM | POA: Insufficient documentation

## 2019-08-03 HISTORY — PX: ESOPHAGOGASTRODUODENOSCOPY (EGD) WITH PROPOFOL: SHX5813

## 2019-08-03 SURGERY — ESOPHAGOGASTRODUODENOSCOPY (EGD) WITH PROPOFOL
Anesthesia: General

## 2019-08-03 MED ORDER — SODIUM CHLORIDE 0.9 % IV SOLN: INTRAVENOUS | Status: DC

## 2019-08-03 MED ORDER — PROPOFOL 500 MG/50ML IV EMUL
INTRAVENOUS | Status: AC
  Filled 2019-08-03: qty 50

## 2019-08-03 MED ORDER — LIDOCAINE HCL (PF) 2 % IJ SOLN
INTRAMUSCULAR | Status: AC
  Filled 2019-08-03: qty 5

## 2019-08-03 MED ORDER — PROPOFOL 500 MG/50ML IV EMUL
INTRAVENOUS | Status: DC | PRN
  Administered 2019-08-03: 120 ug/kg/min via INTRAVENOUS

## 2019-08-03 NOTE — H&P (Signed)
Cephas Darby, MD [address]  [place]  [place], [place] [address]  Main: [phone]  Fax: [phone] Pager: [phone]  Primary Care Physician:  Remi Haggard, FNP Primary Gastroenterologist:  Dr. Cephas Darby  Pre-Procedure History & Physical: HPI:  [name] is a 74 y.o. male is here for an endoscopy.   Past Medical History:  Diagnosis Date  . Alcohol abuse   . Cirrhosis ([place])   . COPD (chronic obstructive pulmonary disease) ([place])   . Dementia ([place])   . Hepatitis C   . Malnutrition ([place])   . Throat cancer ([place])    Lung Cancer (presently)  History of throat cancer    Past Surgical History:  Procedure Laterality Date  . IR IMAGING GUIDED PORT INSERTION  04/28/2019  . TONSILLECTOMY     age was 56  . VASECTOMY      Prior to Admission medications   Medication Sig Start Date End Date Taking? Authorizing Provider  buPROPion (WELLBUTRIN SR) 150 MG 12 hr tablet bupropion HCl SR 150 mg tablet,12 hr sustained-release   Yes [provider]  Cholecalciferol (VITAMIN D3) 5000 units TABS Take 5,000 Units by mouth daily.   Yes [provider]  docusate (COLACE) 50 MG/5ML liquid Take 100 mg by mouth daily.    Yes [provider]  ferrous sulfate 325 (65 FE) MG tablet Take 325 mg by mouth daily with breakfast.   Yes [provider]  furosemide (LASIX) 40 MG tablet Take 1 tablet (40 mg total) by mouth daily. 07/20/19 09/19/19 Yes [name], MD  lactose free nutrition (BOOST PLUS) LIQD Take 237 mLs by mouth 4 (four) times daily.   Yes [provider]  levothyroxine (SYNTHROID) 100 MCG tablet Take 100 mcg by mouth daily before breakfast.   Yes [provider]  mirtazapine (REMERON) 15 MG tablet Take 15 mg by mouth at bedtime. 03/20/19  Yes [provider]  QUEtiapine (SEROQUEL) 25 MG tablet Take 25 mg by mouth 3 (three) times daily.   Yes [provider]  QUEtiapine (SEROQUEL) 50 MG tablet Take 1  tablet (50 mg total) by mouth at bedtime. Patient taking differently: Take 50 mg by mouth at bedtime. Takes with a 25 mg tablet 05/26/16  Yes Mody, Sital, MD  spironolactone (ALDACTONE) 50 MG tablet Take 1 tablet (50 mg total) by mouth daily. 07/20/19 10/20/19 Yes [name], MD  tamsulosin [facility]) 0.4 MG CAPS capsule Take 0.8 mg by mouth daily after breakfast.  10/14/15  Yes [provider]  TRELEGY ELLIPTA 100-62.5-25 MCG/INH AEPB Inhale 1 puff into the lungs in the morning.  02/28/19  Yes [provider]  vitamin C (ASCORBIC ACID) 250 MG tablet Take 500 mg by mouth daily.    Yes [provider]  Vitamin D, Ergocalciferol, (DRISDOL) 1.25 MG (50000 UNIT) CAPS capsule Take 50,000 Units by mouth every 7 (seven) days.   Yes [provider]  alum & mag hydroxide-simeth (MAALOX/MYLANTA) 200-200-20 MG/5ML suspension Take 15 mLs by mouth as needed for indigestion or heartburn. Patient not taking: Reported on 07/20/2019 05/15/16   Nicholes Mango, MD  cefdinir (OMNICEF) 300 MG capsule Take 1 capsule (300 mg total) by mouth 2 (two) times daily. Patient not taking: Reported on 07/20/2019 06/30/19   Edwin Dada, MD  folic acid (FOLVITE) 1 MG tablet Take 1 mg by mouth daily.    [provider]  hydrOXYzine (ATARAX/VISTARIL) 25 MG tablet Take 100 mg by mouth at bedtime.  02/16/19   [provider]  lidocaine-prilocaine (EMLA) cream Apply to affected area once Patient not taking: Reported on 07/20/2019 05/02/19   Lloyd Huger, MD  methylPREDNISolone (MEDROL) 4 MG tablet Take methylprednisolone 4 mg (1 tab) four times daily for 1 day then take 4mg  (1 tab) three times daily for 1 day then take 4 mg (1 tab) two times daily for 1 day then take 4 mg (1 tab) once daily for 1 day then stop Patient not taking: Reported on 07/20/2019 06/30/19   Edwin Dada, MD  mupirocin ointment (BACTROBAN) 2 % Apply 1 application topically 2 (two) times daily.    [provider]  ondansetron (ZOFRAN) 8 MG tablet Take by mouth every 8 (eight) hours as needed for nausea or vomiting.    [provider]  polyethylene glycol (MIRALAX / GLYCOLAX) 17 g packet Take 17 g by mouth daily.    [provider]  prochlorperazine (COMPAZINE) 10 MG tablet Take 1 tablet (10 mg total) by mouth every 6 (six) hours as needed (Nausea or vomiting). Patient not taking: Reported on 07/20/2019 05/02/19   Lloyd Huger, MD  thiamine 100 MG tablet Take 100 mg by mouth daily.    [provider]    Allergies as of 07/21/2019 - Review Complete 07/20/2019  Allergen Reaction Noted  . Penicillins Other (See Comments) 08/24/2015    Family History  Problem Relation Age of Onset  . Breast cancer Mother     Social History   Socioeconomic History  . Marital status: Divorced    Spouse name: Not on file  . Number of children: Not on file  . Years of education: Not on file  . Highest education level: Not on file  Occupational History  . Not on file  Tobacco Use  . Smoking status: Current Every Day Smoker    Packs/day: 0.50    Years: 63.00    Pack years: 31.50    Types: Cigarettes  . Smokeless tobacco: Never Used  Vaping Use  . Vaping Use: Never used  Substance and Sexual Activity  . Alcohol use: Not Currently    Alcohol/week: 1.0 standard drink    Types: 1 Cans of beer per week  . Drug use: No  . Sexual activity: Not Currently  Other Topics Concern  . Not on file  Social History Narrative   Lives at [facility]   Social Determinants of Health   Financial Resource Strain:   . Difficulty of Paying Living Expenses:   Food Insecurity:   . Worried About Charity fundraiser in the Last Year:   . Arboriculturist in the Last Year:   Transportation Needs:   . Film/video editor (Medical):   Marland Kitchen Lack of Transportation (Non-Medical):   Physical Activity:   . Days of Exercise per Week:   . Minutes of Exercise per Session:     Stress:   . Feeling of Stress :   Social Connections:   . Frequency of Communication with Friends and Family:   . Frequency of Social Gatherings with Friends and Family:   . Attends Religious Services:   . Active Member of Clubs or Organizations:   . Attends Archivist Meetings:   Marland Kitchen Marital Status:   Intimate Partner Violence:   . Fear of Current or Ex-Partner:   . Emotionally Abused:   Marland Kitchen Physically Abused:   . Sexually Abused:     Review of Systems:  See HPI, otherwise negative ROS  Physical Exam: BP (!) 162/68   Pulse 97   Temp (!) 97.5 F (36.4 C) (Temporal)   Resp 16   Ht 5\' 6"  (1.676 m)   Wt 61.3 kg   SpO2 100%   BMI 21.81 kg/m  General:   Alert,  pleasant and cooperative in NAD Head:  Normocephalic and atraumatic. Neck:  Supple; no masses or thyromegaly. Lungs:  Clear throughout to auscultation.    Heart:  Regular rate and rhythm. Abdomen:  Soft, nontender and nondistended. Normal bowel sounds, without guarding, and without rebound.   Neurologic:  Alert and  oriented x4;  grossly normal neurologically.  Impression/Plan: Devontre [name] is here for an endoscopy to be performed for variceal screening  Risks, benefits, limitations, and alternatives regarding  endoscopy have been reviewed with the patient.  Questions have been answered.  All parties agreeable.   Sherri Sear, MD  08/03/2019, 10:54 AM

## 2019-08-03 NOTE — Anesthesia Preprocedure Evaluation (Signed)
Anesthesia Evaluation  Patient identified by MRN, date of birth, ID band Patient awake    Reviewed: Allergy & Precautions, NPO status , Patient's Chart, lab work & pertinent test results  History of Anesthesia Complications Negative for: history of anesthetic complications  Airway Mallampati: II       Dental  (+) Poor Dentition   Pulmonary COPD,  COPD inhaler, neg recent URI, Current Smoker,           Cardiovascular (-) hypertension(-) Past MI and (-) CHF (-) dysrhythmias (-) Valvular Problems/Murmurs     Neuro/Psych neg Seizures Anxiety Dementia    GI/Hepatic (+)     substance abuse  alcohol use, Hepatitis -, C  Endo/Other  neg diabetes  Renal/GU Renal InsufficiencyRenal disease     Musculoskeletal   Abdominal   Peds  Hematology   Anesthesia Other Findings   Reproductive/Obstetrics                             Anesthesia Physical Anesthesia Plan  ASA: III  Anesthesia Plan: General   Post-op Pain Management:    Induction: Intravenous  PONV Risk Score and Plan: 1 and Propofol infusion and TIVA  Airway Management Planned: Nasal Cannula  Additional Equipment:   Intra-op Plan:   Post-operative Plan:   Informed Consent: I have reviewed the patients History and Physical, chart, labs and discussed the procedure including the risks, benefits and alternatives for the proposed anesthesia with the patient or authorized representative who has indicated his/her understanding and acceptance.       Plan Discussed with:   Anesthesia Plan Comments:         Anesthesia Quick Evaluation

## 2019-08-03 NOTE — Anesthesia Procedure Notes (Signed)
Performed by: Cook-Martin, Maximilien Hayashi Pre-anesthesia Checklist: Patient identified, Emergency Drugs available, Suction available, Patient being monitored and Timeout performed Patient Re-evaluated:Patient Re-evaluated prior to induction Oxygen Delivery Method: Nasal cannula Preoxygenation: Pre-oxygenation with 100% oxygen Induction Type: IV induction Airway Equipment and Method: Bite block Placement Confirmation: positive ETCO2 and CO2 detector       

## 2019-08-03 NOTE — Anesthesia Postprocedure Evaluation (Signed)
Anesthesia Post Note  Patient: [name]  Procedure(s) Performed: ESOPHAGOGASTRODUODENOSCOPY (EGD) WITH PROPOFOL (N/A )  Patient location during evaluation: Endoscopy Anesthesia Type: General Level of consciousness: awake and alert Pain management: pain level controlled Vital Signs Assessment: post-procedure vital signs reviewed and stable Respiratory status: spontaneous breathing and respiratory function stable Cardiovascular status: stable Anesthetic complications: no   No complications documented.   Last Vitals:  Vitals:   08/03/19 1131 08/03/19 1141  BP: 130/60 117/61  Pulse: 95 92  Resp: 16 15  Temp: (!) 36.4 C   SpO2: 100% 100%    Last Pain:  Vitals:   08/03/19 1141  TempSrc:   PainSc: 0-No pain                 Nathalie Cavendish K

## 2019-08-03 NOTE — Transfer of Care (Signed)
Immediate Anesthesia Transfer of Care Note  Patient: [name]  Procedure(s) Performed: ESOPHAGOGASTRODUODENOSCOPY (EGD) WITH PROPOFOL (N/A )  Patient Location: PACU  Anesthesia Type:General  Level of Consciousness: awake and sedated  Airway & Oxygen Therapy: Patient Spontanous Breathing and Patient connected to nasal cannula oxygen  Post-op Assessment: Report given to RN and Post -op Vital signs reviewed and stable  Post vital signs: Reviewed and stable  Last Vitals:  Vitals Value Taken Time  BP    Temp    Pulse    Resp    SpO2      Last Pain:  Vitals:   08/03/19 1028  TempSrc: Temporal  PainSc: 0-No pain         Complications: No complications documented.

## 2019-08-03 NOTE — Op Note (Signed)
[facility] Gastroenterology Patient Name: [name] Procedure Date: 08/03/2019 10:49 AM MRN: [id number] Account #: [id number] Date of Birth: November 09, 1945 Admit Type: Outpatient Age: 74 Room: [facility] ENDO ROOM 4 Gender: Male Note Status: Finalized Procedure:             Upper GI endoscopy Indications:           Cirrhosis rule out esophageal varices Providers:             Lin Landsman MD, MD Referring MD:          Jordan Likes. Lavena Bullion (Referring MD) Medicines:             Monitored Anesthesia Care Complications:         No immediate complications. Estimated blood loss: None. Procedure:             Pre-Anesthesia Assessment:                        - Prior to the procedure, a History and Physical was                         performed, and patient medications and allergies were                         reviewed. The patient is competent. The risks and                         benefits of the procedure and the sedation options and                         risks were discussed with the patient. All questions                         were answered and informed consent was obtained.                         Patient identification and proposed procedure were                         verified by the physician, the nurse, the                         anesthesiologist, the anesthetist and the technician                         in the pre-procedure area in the procedure room in the                         endoscopy suite. Mental Status Examination: alert and                         oriented. Airway Examination: normal oropharyngeal                         airway and neck mobility. Respiratory Examination:                         clear to auscultation. CV Examination: normal.  Prophylactic Antibiotics: The patient does not require                         prophylactic antibiotics. Prior Anticoagulants: The                         patient has taken no previous  anticoagulant or                         antiplatelet agents. ASA Grade Assessment: III - A                         patient with severe systemic disease. After reviewing                         the risks and benefits, the patient was deemed in                         satisfactory condition to undergo the procedure. The                         anesthesia plan was to use monitored anesthesia care                         (MAC). Immediately prior to administration of                         medications, the patient was re-assessed for adequacy                         to receive sedatives. The heart rate, respiratory                         rate, oxygen saturations, blood pressure, adequacy of                         pulmonary ventilation, and response to care were                         monitored throughout the procedure. The physical                         status of the patient was re-assessed after the                         procedure.                        After obtaining informed consent, the endoscope was                         passed under direct vision. Throughout the procedure,                         the patient's blood pressure, pulse, and oxygen                         saturations were monitored continuously. The Endoscope  was introduced through the mouth, and advanced to the                         second part of duodenum. The upper GI endoscopy was                         accomplished without difficulty. The patient tolerated                         the procedure well. Findings:      The duodenal bulb and second portion of the duodenum were normal.      Mild, diffuse portal hypertensive gastropathy was found in the entire       examined stomach.      The cardia and gastric fundus were normal on retroflexion.      Esophagogastric landmarks were identified: the gastroesophageal junction       was found at 35 cm from the incisors.      The  gastroesophageal junction and examined esophagus were normal. Impression:            - Normal duodenal bulb and second portion of the                         duodenum.                        - Portal hypertensive gastropathy.                        - Esophagogastric landmarks identified.                        - Normal gastroesophageal junction and esophagus.                        - No specimens collected. Recommendation:        - Discharge patient to a nursing home (with escort).                        - Low sodium diet for the rest of the patient's life.                        - Continue present medications.                        - Return to GI clinic as previously scheduled. Procedure Code(s):     --- Professional ---                        [phone], Esophagogastroduodenoscopy, flexible,                         transoral; diagnostic, including collection of                         specimen(s) by brushing or washing, when performed                         (separate procedure) Diagnosis Code(s):     --- Professional ---  K76.6, Portal hypertension                        K31.89, Other diseases of stomach and duodenum                        K74.60, Unspecified cirrhosis of liver CPT copyright 2019 American Medical Association. All rights reserved. The codes documented in this report are preliminary and upon coder review may  be revised to meet current compliance requirements. Dr. Ulyess Mort Lin Landsman MD, MD 08/03/2019 11:30:51 AM Number of Addenda: 0 Note Initiated On: 08/03/2019 10:49 AM Estimated Blood Loss:  Estimated blood loss: none.      [facility]

## 2019-08-04 ENCOUNTER — Encounter: Payer: Self-pay | Admitting: Gastroenterology

## 2019-08-22 ENCOUNTER — Other Ambulatory Visit: Payer: Self-pay

## 2019-08-22 MED ORDER — FUROSEMIDE 40 MG PO TABS: 40.0000 mg | ORAL_TABLET | Freq: Every day | ORAL | 1 refills | Status: AC

## 2020-01-16 ENCOUNTER — Other Ambulatory Visit: Payer: Self-pay | Admitting: Gastroenterology

## 2020-02-09 ENCOUNTER — Other Ambulatory Visit: Payer: Self-pay | Admitting: Gastroenterology

## 2020-02-12 NOTE — Telephone Encounter (Signed)
Inform   He was supposed to get a BMP and follow up per last office note, he did not.   Suggest  Check CMP Once have results will be happy to prescribe his meds since not safe to start on meds which I have no baseline labs for 6 months Will need office visit in 1-2 weeks

## 2020-02-14 ENCOUNTER — Other Ambulatory Visit: Payer: Self-pay

## 2020-02-14 DIAGNOSIS — K7460 Unspecified cirrhosis of liver: Secondary | ICD-10-CM

## 2020-02-14 NOTE — Telephone Encounter (Signed)
Pt's caregiver has been notified of Dr. Georgeann Oppenheim recommendations and agrees to have pt visit lab and schedule a follow up.

## 2020-03-18 ENCOUNTER — Other Ambulatory Visit: Payer: Self-pay | Admitting: Gastroenterology

## 2020-03-19 NOTE — Telephone Encounter (Signed)
Is this okay to refill? 

## 2020-03-19 NOTE — Telephone Encounter (Signed)
Not been seen on 8 months- needs office visit for any refills

## 2020-04-10 LAB — COMPREHENSIVE METABOLIC PANEL
ALT: 57 IU/L — ABNORMAL HIGH (ref 0–44)
AST: 66 IU/L — ABNORMAL HIGH (ref 0–40)
Albumin/Globulin Ratio: 0.8 — ABNORMAL LOW (ref 1.2–2.2)
Albumin: 3.1 g/dL — ABNORMAL LOW (ref 3.7–4.7)
Alkaline Phosphatase: 125 IU/L — ABNORMAL HIGH (ref 44–121)
BUN/Creatinine Ratio: 15 (ref 10–24)
BUN: 17 mg/dL (ref 8–27)
Bilirubin Total: 0.6 mg/dL (ref 0.0–1.2)
CO2: 24 mmol/L (ref 20–29)
Calcium: 8.7 mg/dL (ref 8.6–10.2)
Chloride: 103 mmol/L (ref 96–106)
Creatinine, Ser: 1.11 mg/dL (ref 0.76–1.27)
GFR calc Af Amer: 74 mL/min/{1.73_m2} (ref >59)
GFR calc non Af Amer: 64 mL/min/{1.73_m2} (ref >59)
Globulin, Total: 3.7 g/dL (ref 1.5–4.5)
Glucose: 92 mg/dL (ref 65–99)
Potassium: 3.8 mmol/L (ref 3.5–5.2)
Sodium: 137 mmol/L (ref 134–144)
Total Protein: 6.8 g/dL (ref 6.0–8.5)

## 2020-04-16 ENCOUNTER — Encounter: Payer: Self-pay | Admitting: Gastroenterology

## 2020-10-11 ENCOUNTER — Other Ambulatory Visit: Payer: Self-pay | Admitting: Gastroenterology

## 2020-11-18 NOTE — Progress Notes (Signed)
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    INTERVAL HISTORY: Patient was last seen in clinic in May 2021.  He is referred back by his primary care physician for continued follow-up of his history of stage IV lung cancer.  He currently feels well and is at his baseline.  He has no neurologic complaints.  He denies any pain.  He has a good appetite and denies weight loss.  He has no chest pain, shortness of breath, cough, or hemoptysis.  He denies any nausea, vomiting, constipation, or diarrhea.  He has no urinary complaints.  Patient offers no specific complaints today.   REVIEW OF SYSTEMS:    Review of Systems  Constitutional: Negative.  Negative for fever, malaise/fatigue and weight loss.  HENT: Negative.  Negative for sore throat.   Respiratory: Negative.  Negative for cough and shortness of breath.   Cardiovascular: Negative.  Negative for chest pain and leg swelling.  Gastrointestinal: Negative.  Negative for abdominal pain.  Genitourinary: Negative.  Negative for dysuria.  Musculoskeletal: Negative.  Negative for back pain.  Skin: Negative.  Negative for rash.  Neurological: Negative.  Negative for dizziness, sensory change, focal weakness, weakness and headaches.  Psychiatric/Behavioral: Negative.  Negative for substance abuse. The patient does not have insomnia.    As per HPI. Otherwise, a complete review of systems is negative.  PAST MEDICAL HISTORY: Past Medical History:  Diagnosis Date   Alcohol abuse    Cirrhosis ([place])    COPD (chronic obstructive pulmonary disease) (HCC)    Dementia (HCC)     Hepatitis C    Malnutrition ([place])    Throat cancer ([place])    Lung Cancer (presently)  History of throat cancer    PAST SURGICAL HISTORY: Past Surgical History:  Procedure Laterality Date   ESOPHAGOGASTRODUODENOSCOPY (EGD) WITH PROPOFOL N/A 08/03/2019   Procedure: ESOPHAGOGASTRODUODENOSCOPY (EGD) WITH PROPOFOL;  Surgeon: Lin Landsman, MD;  Location: [facility] ENDOSCOPY;  Service: Gastroenterology;  Laterality: N/A;   IR IMAGING GUIDED PORT INSERTION  04/28/2019   TONSILLECTOMY     age was 34   VASECTOMY      FAMILY HISTORY: Family History  Problem Relation Age of Onset   Breast cancer Mother     ADVANCED DIRECTIVES (Y/N):  N  HEALTH MAINTENANCE: Social History   Tobacco Use   Smoking status: Every Day    Packs/day: 0.50    Years: 63.00    Pack years: 31.50    Types: Cigarettes   Smokeless tobacco: Never  Vaping Use   Vaping Use: Never used  Substance Use Topics   Alcohol use: Not Currently    Alcohol/week: 1.0 standard drink    Types: 1 Cans of beer per week   Drug use: No     Colonoscopy:  PAP:  Bone density:  Lipid panel:  Allergies  Allergen Reactions   Penicillins Other (See Comments)    Reaction: unknown Patient is not able to answer follow up questions    Current Outpatient Medications  Medication Sig Dispense Refill   buPROPion (WELLBUTRIN SR) 150 MG 12 hr tablet bupropion HCl SR 150 mg tablet,12 hr sustained-release     ferrous  sulfate 325 (65 FE) MG tablet Take 325 mg by mouth daily with breakfast.     folic acid (FOLVITE) 1 MG tablet Take 1 mg by mouth daily.     furosemide (LASIX) 40 MG tablet Take 1 tablet (40 mg total) by mouth daily. 90 tablet 1   hydrOXYzine (ATARAX/VISTARIL) 25 MG tablet Take 50 mg by mouth at bedtime.     hydrOXYzine (ATARAX/VISTARIL) 50 MG tablet Take 50 mg by mouth 2 (two) times daily.     lactose free nutrition (BOOST PLUS) LIQD Take 237 mLs by mouth 4 (four) times daily.     levothyroxine (SYNTHROID) 100 MCG tablet  Take 100 mcg by mouth daily before breakfast.     mirtazapine (REMERON) 15 MG tablet Take 15 mg by mouth at bedtime.     mupirocin ointment (BACTROBAN) 2 % Apply 1 application topically 2 (two) times daily.     ondansetron (ZOFRAN) 8 MG tablet Take by mouth every 8 (eight) hours as needed for nausea or vomiting.     polyethylene glycol (MIRALAX / GLYCOLAX) 17 g packet Take 17 g by mouth daily.     QUEtiapine (SEROQUEL) 25 MG tablet Take 25 mg by mouth 3 (three) times daily.     QUEtiapine (SEROQUEL) 50 MG tablet Take 1 tablet (50 mg total) by mouth at bedtime. (Patient taking differently: Take 50 mg by mouth at bedtime. Takes with a 25 mg tablet) 30 tablet 0   spironolactone (ALDACTONE) 50 MG tablet TAKE 1 TABLET BY MOUTH EVERY DAY 90 tablet 1   tamsulosin (FLOMAX) 0.4 MG CAPS capsule Take 0.8 mg by mouth daily after breakfast.      thiamine 100 MG tablet Take 100 mg by mouth daily.     TIROSINT-SOL 150 MCG/ML SOLN Take by mouth.     TRELEGY ELLIPTA 100-62.5-25 MCG/INH AEPB Inhale 1 puff into the lungs in the morning.      vitamin C (ASCORBIC ACID) 250 MG tablet Take 500 mg by mouth daily.      Vitamin D, Ergocalciferol, (DRISDOL) 1.25 MG (50000 UNIT) CAPS capsule Take 50,000 Units by mouth every 7 (seven) days.     Cholecalciferol (VITAMIN D3) 5000 units TABS Take 5,000 Units by mouth daily. (Patient not taking: Reported on 11/19/2020)     docusate (COLACE) 50 MG/5ML liquid Take 100 mg by mouth daily.  (Patient not taking: Reported on 11/19/2020)     prochlorperazine (COMPAZINE) 10 MG tablet Take 1 tablet (10 mg total) by mouth every 6 (six) hours as needed (Nausea or vomiting). (Patient not taking: No sig reported) 60 tablet 2   No current facility-administered medications for this visit.   Facility-Administered Medications Ordered in Other Visits  Medication Dose Route Frequency Provider Last Rate Last Admin   heparin lock flush 100 unit/mL  500 Units Intravenous Once Lloyd Huger, MD         OBJECTIVE: Vitals:   11/19/20 1049  BP: 112/68  Pulse: 91  Resp: 16  Temp: (!) 97.1 F (36.2 C)     Body mass index is 18.61 kg/m.    ECOG FS:0 - Asymptomatic  General: Thin, no acute distress. Eyes: Pink conjunctiva, anicteric sclera. HEENT: Normocephalic, moist mucous membranes. Lungs: No audible wheezing or coughing. Heart: Regular rate and rhythm. Abdomen: Soft, nontender, no obvious distention. Musculoskeletal: No edema, cyanosis, or clubbing. Neuro: Alert, answering all questions appropriately. Cranial nerves grossly intact. Skin: No rashes or petechiae noted. Psych: Normal affect.  LAB RESULTS:  Lab  Results  Component Value Date   NA 135 11/19/2020   K 3.7 11/19/2020   CL 103 11/19/2020   CO2 27 11/19/2020   GLUCOSE 122 (H) 11/19/2020   BUN 17 11/19/2020   CREATININE 0.88 11/19/2020   CALCIUM 8.4 (L) 11/19/2020   PROT 6.9 11/19/2020   ALBUMIN 2.7 (L) 11/19/2020   AST 38 11/19/2020   ALT 28 11/19/2020   ALKPHOS 82 11/19/2020   BILITOT 1.1 11/19/2020   GFRNONAA >60 11/19/2020   GFRAA 74 04/09/2020    Lab Results  Component Value Date   WBC 4.7 11/19/2020   NEUTROABS 3.0 11/19/2020   HGB 10.4 (L) 11/19/2020   HCT 31.0 (L) 11/19/2020   MCV 95.4 11/19/2020   PLT 143 (L) 11/19/2020     STUDIES: No results found.   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 PD-L1 10%, with high tumor mutational burden.  PLAN:    1.  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  He only received 3 cycles of treatment and then subsequently was lost to follow-up.  Will repeat PET scan in the next 2 to 3 weeks to assess his overall disease status and whether or not treatment needs to be reinitiated.  Return to clinic 1 to 2 days after his PET scan to discuss the results.   2. 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  Patient received his last dose of weekly cisplatin on May 04, 2016.  PET scan as above. 3.  Social situation: Patient has a legal guardian. 4.  Anemia: Mild, patient's hemoglobin is 10.4 today.  I spent a total of 30 minutes reviewing chart data, face-to-face evaluation with the patient, counseling and coordination of care as detailed above.   Patient expressed understanding and was in agreement with this plan. He also understands that He can call clinic at any time with any questions, concerns, or complaints.    Cancer Staging Adenocarcinoma, lung, right [facility]) Staging form: Lung, AJCC 8th Edition - Clinical stage from 05/02/2019: Stage IVB (cT1c, cN2, cM1c) - Signed by Lloyd Huger, MD on 05/02/2019  Primary cancer of larynx [facility]) Staging form: Larynx - Supraglottis, AJCC 8th Edition - Clinical stage from 02/27/2016: Stage IVA (cT1, cN2b, cM0) - Signed by Lloyd Huger, MD on 02/27/2016 Laterality: Right   Lloyd Huger, MD 11/20/20 9:08 AM

## 2020-11-19 ENCOUNTER — Inpatient Hospital Stay: Payer: Medicare Other | Attending: Oncology | Admitting: Oncology

## 2020-11-19 ENCOUNTER — Other Ambulatory Visit: Payer: Self-pay | Admitting: Lab

## 2020-11-19 ENCOUNTER — Encounter: Payer: Self-pay | Admitting: Oncology

## 2020-11-19 ENCOUNTER — Inpatient Hospital Stay: Payer: Medicare Other

## 2020-11-19 ENCOUNTER — Other Ambulatory Visit: Payer: Self-pay

## 2020-11-19 VITALS — BP 112/68 | HR 91 | Temp 97.1°F | Resp 16 | Wt 115.3 lb

## 2020-11-19 DIAGNOSIS — D649 Anemia, unspecified: Secondary | ICD-10-CM | POA: Insufficient documentation

## 2020-11-19 DIAGNOSIS — F17210 Nicotine dependence, cigarettes, uncomplicated: Secondary | ICD-10-CM | POA: Diagnosis not present

## 2020-11-19 DIAGNOSIS — C3491 Malignant neoplasm of unspecified part of right bronchus or lung: Secondary | ICD-10-CM

## 2020-11-19 DIAGNOSIS — C329 Malignant neoplasm of larynx, unspecified: Secondary | ICD-10-CM | POA: Insufficient documentation

## 2020-11-19 DIAGNOSIS — C3411 Malignant neoplasm of upper lobe, right bronchus or lung: Secondary | ICD-10-CM | POA: Diagnosis not present

## 2020-11-19 DIAGNOSIS — Z95828 Presence of other vascular implants and grafts: Secondary | ICD-10-CM

## 2020-11-19 DIAGNOSIS — C7951 Secondary malignant neoplasm of bone: Secondary | ICD-10-CM | POA: Diagnosis not present

## 2020-11-19 LAB — CBC WITH DIFFERENTIAL/PLATELET
Abs Immature Granulocytes: 0.04 10*3/uL (ref 0.00–0.07)
Basophils Absolute: 0 10*3/uL (ref 0.0–0.1)
Basophils Relative: 0 %
Eosinophils Absolute: 0.1 10*3/uL (ref 0.0–0.5)
Eosinophils Relative: 2 %
HCT: 31 % — ABNORMAL LOW (ref 39.0–52.0)
Hemoglobin: 10.4 g/dL — ABNORMAL LOW (ref 13.0–17.0)
Immature Granulocytes: 1 %
Lymphocytes Relative: 20 %
Lymphs Abs: 0.9 10*3/uL (ref 0.7–4.0)
MCH: 32 pg (ref 26.0–34.0)
MCHC: 33.5 g/dL (ref 30.0–36.0)
MCV: 95.4 fL (ref 80.0–100.0)
Monocytes Absolute: 0.7 10*3/uL (ref 0.1–1.0)
Monocytes Relative: 14 %
Neutro Abs: 3 10*3/uL (ref 1.7–7.7)
Neutrophils Relative %: 63 %
Platelets: 143 10*3/uL — ABNORMAL LOW (ref 150–400)
RBC: 3.25 MIL/uL — ABNORMAL LOW (ref 4.22–5.81)
RDW: 14.4 % (ref 11.5–15.5)
WBC: 4.7 10*3/uL (ref 4.0–10.5)
nRBC: 0 % (ref 0.0–0.2)

## 2020-11-19 LAB — COMPREHENSIVE METABOLIC PANEL
ALT: 28 U/L (ref 0–44)
AST: 38 U/L (ref 15–41)
Albumin: 2.7 g/dL — ABNORMAL LOW (ref 3.5–5.0)
Alkaline Phosphatase: 82 U/L (ref 38–126)
Anion gap: 5 (ref 5–15)
BUN: 17 mg/dL (ref 8–23)
CO2: 27 mmol/L (ref 22–32)
Calcium: 8.4 mg/dL — ABNORMAL LOW (ref 8.9–10.3)
Chloride: 103 mmol/L (ref 98–111)
Creatinine, Ser: 0.88 mg/dL (ref 0.61–1.24)
GFR, Estimated: >60 mL/min (ref >60)
Glucose, Bld: 122 mg/dL — ABNORMAL HIGH (ref 70–99)
Potassium: 3.7 mmol/L (ref 3.5–5.1)
Sodium: 135 mmol/L (ref 135–145)
Total Bilirubin: 1.1 mg/dL (ref 0.3–1.2)
Total Protein: 6.9 g/dL (ref 6.5–8.1)

## 2020-11-19 MED ORDER — HEPARIN SOD (PORK) LOCK FLUSH 100 UNIT/ML IV SOLN
500.0000 [IU] | Freq: Once | INTRAVENOUS | Status: AC
  Administered 2020-11-19: 500 [IU] via INTRAVENOUS
  Filled 2020-11-19: qty 5

## 2020-11-20 ENCOUNTER — Encounter: Payer: Self-pay | Admitting: Oncology

## 2020-11-25 ENCOUNTER — Other Ambulatory Visit: Payer: Self-pay

## 2020-11-25 ENCOUNTER — Ambulatory Visit
Admission: RE | Admit: 2020-11-25 | Discharge: 2020-11-25 | Disposition: A | Discharge status: Home / Self Care | Payer: Medicare Other | Source: Ambulatory Visit | Attending: Oncology | Admitting: Oncology

## 2020-11-25 DIAGNOSIS — C3491 Malignant neoplasm of unspecified part of right bronchus or lung: Secondary | ICD-10-CM | POA: Insufficient documentation

## 2020-11-25 DIAGNOSIS — K8020 Calculus of gallbladder without cholecystitis without obstruction: Secondary | ICD-10-CM | POA: Insufficient documentation

## 2020-11-25 DIAGNOSIS — I700 Atherosclerosis of aorta: Secondary | ICD-10-CM | POA: Insufficient documentation

## 2020-11-25 DIAGNOSIS — C329 Malignant neoplasm of larynx, unspecified: Secondary | ICD-10-CM | POA: Diagnosis not present

## 2020-11-25 DIAGNOSIS — K7460 Unspecified cirrhosis of liver: Secondary | ICD-10-CM | POA: Diagnosis not present

## 2020-11-25 LAB — GLUCOSE, CAPILLARY: Glucose-Capillary: 101 mg/dL — ABNORMAL HIGH (ref 70–99)

## 2020-11-25 MED ORDER — FLUDEOXYGLUCOSE F - 18 (FDG) INJECTION
5.9000 | Freq: Once | INTRAVENOUS | Status: AC
  Administered 2020-11-25: 5.99 via INTRAVENOUS

## 2020-12-02 NOTE — Progress Notes (Signed)
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  PDL-1 10%, high TMB.  INTERVAL HISTORY: Patient returns to clinic today for further evaluation, discussion of his PET scan results, and treatment planning.  He continues to feel well and is at his baseline.  He has no neurologic complaints.  He denies any pain.  He has a good appetite and denies weight loss.  He has no chest pain, shortness of breath, cough, or hemoptysis.  He denies any nausea, vomiting, constipation, or diarrhea.  He has no urinary complaints.  Patient offers no specific complaints today.  REVIEW OF SYSTEMS:    Review of Systems  Constitutional: Negative.  Negative for fever, malaise/fatigue and weight loss.  HENT: Negative.  Negative for sore throat.   Respiratory: Negative.  Negative for cough and shortness of breath.   Cardiovascular: Negative.  Negative for chest pain and leg swelling.  Gastrointestinal: Negative.  Negative for abdominal pain.  Genitourinary: Negative.  Negative for dysuria.  Musculoskeletal: Negative.  Negative for back pain.  Skin: Negative.  Negative for rash.  Neurological: Negative.  Negative for dizziness, sensory change, focal weakness, weakness and headaches.  Psychiatric/Behavioral: Negative.  Negative for substance abuse. The patient does not have insomnia.    As per HPI. Otherwise, a complete review of systems is negative.  PAST MEDICAL HISTORY: Past Medical History:  Diagnosis Date   Alcohol abuse    Cirrhosis ([place])    COPD (chronic obstructive pulmonary disease) (HCC)    Dementia (HCC)    Hepatitis C     Malnutrition ([place])    Throat cancer ([place])    Lung Cancer (presently)  History of throat cancer    PAST SURGICAL HISTORY: Past Surgical History:  Procedure Laterality Date   ESOPHAGOGASTRODUODENOSCOPY (EGD) WITH PROPOFOL N/A 08/03/2019   Procedure: ESOPHAGOGASTRODUODENOSCOPY (EGD) WITH PROPOFOL;  Surgeon: Lin Landsman, MD;  Location: [facility] ENDOSCOPY;  Service: Gastroenterology;  Laterality: N/A;   IR IMAGING GUIDED PORT INSERTION  04/28/2019   TONSILLECTOMY     age was 64   VASECTOMY      FAMILY HISTORY: Family History  Problem Relation Age of Onset   Breast cancer Mother     ADVANCED DIRECTIVES (Y/N):  N  HEALTH MAINTENANCE: Social History   Tobacco Use   Smoking status: Every Day    Packs/day: 0.50    Years: 63.00    Pack years: 31.50    Types: Cigarettes   Smokeless tobacco: Never  Vaping Use   Vaping Use: Never used  Substance Use Topics   Alcohol use: Not Currently    Alcohol/week: 1.0 standard drink    Types: 1 Cans of beer per week   Drug use: No     Colonoscopy:  PAP:  Bone density:  Lipid panel:  Allergies  Allergen Reactions   Penicillins Other (See Comments)    Reaction: unknown Patient is not able to answer follow up questions    Current Outpatient Medications  Medication Sig Dispense Refill   buPROPion (WELLBUTRIN SR) 150 MG 12 hr tablet bupropion HCl SR 150 mg tablet,12 hr sustained-release     docusate (COLACE) 50 MG/5ML liquid Take 100 mg by mouth daily.  ferrous sulfate 325 (65 FE) MG tablet Take 325 mg by mouth daily with breakfast.     folic acid (FOLVITE) 1 MG tablet Take 1 mg by mouth daily.     furosemide (LASIX) 40 MG tablet Take 1 tablet (40 mg total) by mouth daily. 90 tablet 1   hydrOXYzine (ATARAX/VISTARIL) 25 MG tablet Take 50 mg by mouth at bedtime.     hydrOXYzine (ATARAX/VISTARIL) 50 MG tablet Take 50 mg by mouth 2 (two) times daily.     lactose free nutrition (BOOST PLUS) LIQD Take 237 mLs by mouth 4 (four) times  daily.     lidocaine-prilocaine (EMLA) cream Apply 1 application topically as needed. Apply to port and cover with saran wrap 1-2 hours prior to port access 30 g 1   mirtazapine (REMERON) 15 MG tablet Take 15 mg by mouth at bedtime.     mupirocin ointment (BACTROBAN) 2 % Apply 1 application topically 2 (two) times daily.     ondansetron (ZOFRAN) 8 MG tablet Take by mouth every 8 (eight) hours as needed for nausea or vomiting.     polyethylene glycol (MIRALAX / GLYCOLAX) 17 g packet Take 17 g by mouth daily.     prochlorperazine (COMPAZINE) 10 MG tablet Take 1 tablet (10 mg total) by mouth every 6 (six) hours as needed (Nausea or vomiting). 60 tablet 2   QUEtiapine (SEROQUEL) 25 MG tablet Take 25 mg by mouth 3 (three) times daily.     QUEtiapine (SEROQUEL) 50 MG tablet Take 1 tablet (50 mg total) by mouth at bedtime. (Patient taking differently: Take 50 mg by mouth at bedtime. Takes with a 25 mg tablet) 30 tablet 0   spironolactone (ALDACTONE) 50 MG tablet TAKE 1 TABLET BY MOUTH EVERY DAY 90 tablet 1   tamsulosin (FLOMAX) 0.4 MG CAPS capsule Take 0.8 mg by mouth daily after breakfast.      thiamine 100 MG tablet Take 100 mg by mouth daily.     TIROSINT-SOL 150 MCG/ML SOLN Take by mouth.     TRELEGY ELLIPTA 100-62.5-25 MCG/INH AEPB Inhale 1 puff into the lungs in the morning.      vitamin C (ASCORBIC ACID) 250 MG tablet Take 500 mg by mouth daily.      Vitamin D, Ergocalciferol, (DRISDOL) 1.25 MG (50000 UNIT) CAPS capsule Take 50,000 Units by mouth every 7 (seven) days.     Cholecalciferol (VITAMIN D3) 5000 units TABS Take 5,000 Units by mouth daily. (Patient not taking: No sig reported)     levothyroxine (SYNTHROID) 100 MCG tablet Take 100 mcg by mouth daily before breakfast.     No current facility-administered medications for this visit.   Facility-Administered Medications Ordered in Other Visits  Medication Dose Route Frequency Provider Last Rate Last Admin   heparin lock flush 100 unit/mL   500 Units Intravenous Once Lloyd Huger, MD        OBJECTIVE: Vitals:   12/03/20 1318  BP: 124/75  Pulse: 97  Resp: 18     Body mass index is 18.87 kg/m.    ECOG FS:0 - Asymptomatic  General: Thin, no acute distress. Eyes: Pink conjunctiva, anicteric sclera. HEENT: Normocephalic, moist mucous membranes. Lungs: No audible wheezing or coughing. Heart: Regular rate and rhythm. Abdomen: Soft, nontender, no obvious distention. Musculoskeletal: No edema, cyanosis, or clubbing. Neuro: Alert, answering all questions appropriately. Cranial nerves grossly intact. Skin: No rashes or petechiae noted. Psych: Normal affect.   LAB RESULTS:  Lab Results  Component Value Date  NA 135 11/19/2020   K 3.7 11/19/2020   CL 103 11/19/2020   CO2 27 11/19/2020   GLUCOSE 122 (H) 11/19/2020   BUN 17 11/19/2020   CREATININE 0.88 11/19/2020   CALCIUM 8.4 (L) 11/19/2020   PROT 6.9 11/19/2020   ALBUMIN 2.7 (L) 11/19/2020   AST 38 11/19/2020   ALT 28 11/19/2020   ALKPHOS 82 11/19/2020   BILITOT 1.1 11/19/2020   GFRNONAA >60 11/19/2020   GFRAA 74 04/09/2020    Lab Results  Component Value Date   WBC 4.7 11/19/2020   NEUTROABS 3.0 11/19/2020   HGB 10.4 (L) 11/19/2020   HCT 31.0 (L) 11/19/2020   MCV 95.4 11/19/2020   PLT 143 (L) 11/19/2020     STUDIES: NM PET Image Restage (PS) Skull Base to Thigh (F-18 FDG)  Result Date: 11/26/2020 CLINICAL DATA:  Subsequent treatment strategy for adenocarcinoma of the right lung primary cancer of the larynx. EXAM: NUCLEAR MEDICINE PET SKULL BASE TO THIGH TECHNIQUE: 6.0 mCi F-18 FDG was injected intravenously. Full-ring PET imaging was performed from the skull base to thigh after the radiotracer. CT data was obtained and used for attenuation correction and anatomic localization. Fasting blood glucose: 101 mg/dl COMPARISON:  04/26/2019 FINDINGS: Mediastinal blood pool activity: SUV max 2.0 Liver activity: SUV max NA NECK: Muscle activity in the neck  likely from movement. Incidental CT findings: none CHEST: Irregular right upper lobe pulmonary mass measures 3.8 x 2.5 cm today compared to 2.8 x 2.6 cm (remeasured) previously. SUV max = 12.4 today compared to 17 previously. Interval decrease in hypermetabolic mediastinal lymphadenopathy with no residual hypermetabolism in the AP window on today's study. High right paratracheal hypermetabolic lymph node seen on the previous study has also resolved. Index subcarinal hypermetabolic lymph node measured previously at 1.9 cm short axis is 0.6 cm short axis today. SUV max = 5.2 on today's exam, decreased from 10.0 previously. Despite the improvement in mediastinal hypermetabolic lymphadenopathy, there is new hypermetabolic lymphadenopathy in the right hilum on today's study with SUV max = 6.7. Incidental CT findings: Right Port-A-Cath tip is positioned at the SVC/RA junction. There is moderate atherosclerotic calcification of the abdominal aorta without aneurysm. Coronary artery calcification is evident. Centrilobular emphsyema noted. ABDOMEN/PELVIS: No abnormal hypermetabolic activity within the liver, pancreas, adrenal glands, or spleen. No hypermetabolic lymph nodes in the abdomen or pelvis. Incidental CT findings: Calcified gallstones evident. Nodular liver contour consistent with cirrhosis. There is moderate atherosclerotic calcification of the abdominal aorta without aneurysm. Diverticular changes noted left colon. SKELETON: No focal hypermetabolic activity to suggest skeletal metastasis. The focal hypermetabolism seen previously in the posterior left acetabulum as resolved in the interval. Incidental CT findings: Cortical bone loss in the posterior left acetabulum associated with the hypermetabolic lesion previously has resolved in the interval. IMPRESSION: 1. Apparent interval mixed response to therapy. Right upper lobe pulmonary mass measures slightly larger today although hypermetabolism in this lesion is  mildly decreased in the interval. 2. Hypermetabolic lymph nodes in the high right paratracheal space and AP window on the previous PET-CT have resolved completely in the interval. Index hypermetabolic subcarinal metastatic lymphadenopathy on the previous study has decreased in the interval. 3. Interval development of 3 hypermetabolic foci in the right hilum, highly suspicious for new right hilar metastatic disease. 4. Interval resolution of hypermetabolic lesion posterior left acetabulum with restoration of cortical bone in the interval since prior study. 5. Cholelithiasis 6. Cirrhosis 7.  Aortic Atherosclerois (ICD10-170.0) Electronically Signed   By: Misty Stanley  M.D.   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 PD-L1 10%, with high tumor mutational burden.  PLAN:    1.  Stage IVb adenocarcinoma of the right upper lobe lung: PET scan results from November 26, 2020 reviewed independently and report as above with likely progressive disease.  Areas of improvement are likely residual from previous when patient last received treatment with carboplatinum, pemetrexed, and Keytruda on Jun 22, 2019.  He only received 3 cycles of treatment and then subsequently was lost to follow-up.  After lengthy discussion with patient and his caretaker, he has agreed to pursue single agent Keytruda every 3 weeks.  Repeat PET scan in 4 to 6 months.  Return to clinic on December 17, 2020 for further evaluation and reinitiation of Keytruda. 2. 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  Patient received his last dose of weekly cisplatin on May 04, 2016.  PET scan as above. 3.  Social situation: Patient has a legal guardian. 4.  Anemia: Mild, patient's hemoglobin is 10.4.  I spent a total of 30 minutes reviewing chart data, face-to-face evaluation with the patient, counseling and coordination of care as  detailed above.    Patient expressed understanding and was in agreement with this plan. He also understands that He can call clinic at any time with any questions, concerns, or complaints.    Cancer Staging Adenocarcinoma, lung, right [facility]) Staging form: Lung, AJCC 8th Edition - Clinical stage from 05/02/2019: Stage IVB (cT1c, cN2, cM1c) - Signed by Lloyd Huger, MD on 05/02/2019  Primary cancer of larynx [facility]) Staging form: Larynx - Supraglottis, AJCC 8th Edition - Clinical stage from 02/27/2016: Stage IVA (cT1, cN2b, cM0) - Signed by Lloyd Huger, MD on 02/27/2016 Laterality: Right   Lloyd Huger, MD 12/03/20 4:09 PM

## 2020-12-03 ENCOUNTER — Telehealth: Payer: Self-pay | Admitting: Emergency Medicine

## 2020-12-03 ENCOUNTER — Encounter: Payer: Self-pay | Admitting: Oncology

## 2020-12-03 ENCOUNTER — Other Ambulatory Visit: Payer: Self-pay

## 2020-12-03 ENCOUNTER — Inpatient Hospital Stay (HOSPITAL_BASED_OUTPATIENT_CLINIC_OR_DEPARTMENT_OTHER): Payer: Medicare Other | Admitting: Oncology

## 2020-12-03 VITALS — BP 124/75 | HR 97 | Resp 18 | Wt 116.9 lb

## 2020-12-03 DIAGNOSIS — C3491 Malignant neoplasm of unspecified part of right bronchus or lung: Secondary | ICD-10-CM

## 2020-12-03 DIAGNOSIS — C3411 Malignant neoplasm of upper lobe, right bronchus or lung: Secondary | ICD-10-CM | POA: Diagnosis not present

## 2020-12-03 MED ORDER — LIDOCAINE-PRILOCAINE 2.5-2.5 % EX CREA: 1.0000 "application " | TOPICAL_CREAM | CUTANEOUS | 1 refills | Status: AC | PRN

## 2020-12-03 NOTE — Telephone Encounter (Signed)
PA for Emla cream submitted on covermymeds. Key: [id number]

## 2020-12-03 NOTE — Progress Notes (Signed)
Patient denies new problems/concerns today.   °

## 2020-12-12 NOTE — Progress Notes (Signed)
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  PDL-1 10%, high TMB.  INTERVAL HISTORY: Patient returns to clinic today for further evaluation and reinitiation of single agent Keytruda.  He continues to feel well and is at his baseline.  He has no neurologic complaints.  He denies any pain.  He has a good appetite and denies weight loss.  He has no chest pain, shortness of breath, cough, or hemoptysis.  He denies any nausea, vomiting, constipation, or diarrhea.  He has no urinary complaints.  Patient offers no specific complaints today.  REVIEW OF SYSTEMS:    Review of Systems  Constitutional: Negative.  Negative for fever, malaise/fatigue and weight loss.  HENT: Negative.  Negative for sore throat.   Respiratory: Negative.  Negative for cough and shortness of breath.   Cardiovascular: Negative.  Negative for chest pain and leg swelling.  Gastrointestinal: Negative.  Negative for abdominal pain.  Genitourinary: Negative.  Negative for dysuria.  Musculoskeletal: Negative.  Negative for back pain.  Skin: Negative.  Negative for rash.  Neurological: Negative.  Negative for dizziness, sensory change, focal weakness, weakness and headaches.  Psychiatric/Behavioral: Negative.  Negative for substance abuse. The patient does not have insomnia.    As per HPI. Otherwise, a complete review of systems is negative.  PAST MEDICAL HISTORY: Past Medical History:  Diagnosis Date   Alcohol abuse    Cirrhosis ([place])    COPD (chronic obstructive pulmonary disease) (HCC)    Dementia (HCC)    Hepatitis C    Malnutrition ([place])     Throat cancer ([place])    Lung Cancer (presently)  History of throat cancer    PAST SURGICAL HISTORY: Past Surgical History:  Procedure Laterality Date   ESOPHAGOGASTRODUODENOSCOPY (EGD) WITH PROPOFOL N/A 08/03/2019   Procedure: ESOPHAGOGASTRODUODENOSCOPY (EGD) WITH PROPOFOL;  Surgeon: Lin Landsman, MD;  Location: [facility] ENDOSCOPY;  Service: Gastroenterology;  Laterality: N/A;   IR IMAGING GUIDED PORT INSERTION  04/28/2019   TONSILLECTOMY     age was 74   VASECTOMY      FAMILY HISTORY: Family History  Problem Relation Age of Onset   Breast cancer Mother     ADVANCED DIRECTIVES (Y/N):  N  HEALTH MAINTENANCE: Social History   Tobacco Use   Smoking status: Every Day    Packs/day: 0.50    Years: 63.00    Pack years: 31.50    Types: Cigarettes   Smokeless tobacco: Never  Vaping Use   Vaping Use: Never used  Substance Use Topics   Alcohol use: Not Currently    Alcohol/week: 1.0 standard drink    Types: 1 Cans of beer per week   Drug use: No     Colonoscopy:  PAP:  Bone density:  Lipid panel:  Allergies  Allergen Reactions   Penicillins Other (See Comments)    Reaction: unknown Patient is not able to answer follow up questions    Current Outpatient Medications  Medication Sig Dispense Refill   buPROPion (WELLBUTRIN SR) 150 MG 12 hr tablet bupropion HCl SR 150 mg tablet,12 hr sustained-release     docusate (COLACE) 50 MG/5ML liquid Take 100 mg by mouth daily.  ferrous sulfate 325 (65 FE) MG tablet Take 325 mg by mouth daily with breakfast.     folic acid (FOLVITE) 1 MG tablet Take 1 mg by mouth daily.     hydrOXYzine (ATARAX/VISTARIL) 25 MG tablet Take 50 mg by mouth at bedtime.     hydrOXYzine (ATARAX/VISTARIL) 50 MG tablet Take 50 mg by mouth 2 (two) times daily.     lactose free nutrition (BOOST PLUS) LIQD Take 237 mLs by mouth 4 (four) times daily.     levothyroxine (SYNTHROID) 100 MCG tablet Take 100 mcg by mouth daily before breakfast.      lidocaine-prilocaine (EMLA) cream Apply 1 application topically as needed. Apply to port and cover with saran wrap 1-2 hours prior to port access 30 g 1   mirtazapine (REMERON) 15 MG tablet Take 15 mg by mouth at bedtime.     mupirocin ointment (BACTROBAN) 2 % Apply 1 application topically 2 (two) times daily.     ondansetron (ZOFRAN) 8 MG tablet Take by mouth every 8 (eight) hours as needed for nausea or vomiting.     polyethylene glycol (MIRALAX / GLYCOLAX) 17 g packet Take 17 g by mouth daily.     prochlorperazine (COMPAZINE) 10 MG tablet Take 1 tablet (10 mg total) by mouth every 6 (six) hours as needed (Nausea or vomiting). 60 tablet 2   QUEtiapine (SEROQUEL) 25 MG tablet Take 25 mg by mouth 3 (three) times daily.     QUEtiapine (SEROQUEL) 50 MG tablet Take 1 tablet (50 mg total) by mouth at bedtime. (Patient taking differently: Take 50 mg by mouth at bedtime. Takes with a 25 mg tablet) 30 tablet 0   spironolactone (ALDACTONE) 50 MG tablet TAKE 1 TABLET BY MOUTH EVERY DAY 90 tablet 1   tamsulosin (FLOMAX) 0.4 MG CAPS capsule Take 0.8 mg by mouth daily after breakfast.      thiamine 100 MG tablet Take 100 mg by mouth daily.     TIROSINT-SOL 150 MCG/ML SOLN Take by mouth.     TRELEGY ELLIPTA 100-62.5-25 MCG/INH AEPB Inhale 1 puff into the lungs in the morning.      vitamin C (ASCORBIC ACID) 250 MG tablet Take 500 mg by mouth daily.      Vitamin D, Ergocalciferol, (DRISDOL) 1.25 MG (50000 UNIT) CAPS capsule Take 50,000 Units by mouth every 7 (seven) days.     Cholecalciferol (VITAMIN D3) 5000 units TABS Take 5,000 Units by mouth daily. (Patient not taking: No sig reported)     furosemide (LASIX) 40 MG tablet Take 1 tablet (40 mg total) by mouth daily. 90 tablet 1   No current facility-administered medications for this visit.   Facility-Administered Medications Ordered in Other Visits  Medication Dose Route Frequency Provider Last Rate Last Admin   heparin lock flush 100 unit/mL  500 Units  Intravenous Once Lloyd Huger, MD        OBJECTIVE: Vitals:   12/17/20 1324  BP: 109/69  Pulse: 91  Resp: 16  Temp: 98.1 F (36.7 C)     Body mass index is 17.5 kg/m.    ECOG FS:0 - Asymptomatic  General: Thin, no acute distress. Eyes: Pink conjunctiva, anicteric sclera. HEENT: Normocephalic, moist mucous membranes. Lungs: No audible wheezing or coughing. Heart: Regular rate and rhythm. Abdomen: Soft, nontender, no obvious distention. Musculoskeletal: No edema, cyanosis, or clubbing. Neuro: Alert, answering all questions appropriately. Cranial nerves grossly intact. Skin: No rashes or petechiae noted. Psych: Normal affect.  LAB RESULTS:  Lab  Results  Component Value Date   NA 136 12/17/2020   K 3.3 (L) 12/17/2020   CL 101 12/17/2020   CO2 28 12/17/2020   GLUCOSE 116 (H) 12/17/2020   BUN 14 12/17/2020   CREATININE 1.06 12/17/2020   CALCIUM 8.6 (L) 12/17/2020   PROT 6.8 12/17/2020   ALBUMIN 2.7 (L) 12/17/2020   AST 32 12/17/2020   ALT 26 12/17/2020   ALKPHOS 99 12/17/2020   BILITOT 0.8 12/17/2020   GFRNONAA >60 12/17/2020   GFRAA 74 04/09/2020    Lab Results  Component Value Date   WBC 6.0 12/17/2020   NEUTROABS 4.1 12/17/2020   HGB 10.4 (L) 12/17/2020   HCT 30.6 (L) 12/17/2020   MCV 94.4 12/17/2020   PLT 155 12/17/2020     STUDIES: NM PET Image Restage (PS) Skull Base to Thigh (F-18 FDG)  Result Date: 11/26/2020 CLINICAL DATA:  Subsequent treatment strategy for adenocarcinoma of the right lung primary cancer of the larynx. EXAM: NUCLEAR MEDICINE PET SKULL BASE TO THIGH TECHNIQUE: 6.0 mCi F-18 FDG was injected intravenously. Full-ring PET imaging was performed from the skull base to thigh after the radiotracer. CT data was obtained and used for attenuation correction and anatomic localization. Fasting blood glucose: 101 mg/dl COMPARISON:  04/26/2019 FINDINGS: Mediastinal blood pool activity: SUV max 2.0 Liver activity: SUV max NA NECK: Muscle  activity in the neck likely from movement. Incidental CT findings: none CHEST: Irregular right upper lobe pulmonary mass measures 3.8 x 2.5 cm today compared to 2.8 x 2.6 cm (remeasured) previously. SUV max = 12.4 today compared to 17 previously. Interval decrease in hypermetabolic mediastinal lymphadenopathy with no residual hypermetabolism in the AP window on today's study. High right paratracheal hypermetabolic lymph node seen on the previous study has also resolved. Index subcarinal hypermetabolic lymph node measured previously at 1.9 cm short axis is 0.6 cm short axis today. SUV max = 5.2 on today's exam, decreased from 10.0 previously. Despite the improvement in mediastinal hypermetabolic lymphadenopathy, there is new hypermetabolic lymphadenopathy in the right hilum on today's study with SUV max = 6.7. Incidental CT findings: Right Port-A-Cath tip is positioned at the SVC/RA junction. There is moderate atherosclerotic calcification of the abdominal aorta without aneurysm. Coronary artery calcification is evident. Centrilobular emphsyema noted. ABDOMEN/PELVIS: No abnormal hypermetabolic activity within the liver, pancreas, adrenal glands, or spleen. No hypermetabolic lymph nodes in the abdomen or pelvis. Incidental CT findings: Calcified gallstones evident. Nodular liver contour consistent with cirrhosis. There is moderate atherosclerotic calcification of the abdominal aorta without aneurysm. Diverticular changes noted left colon. SKELETON: No focal hypermetabolic activity to suggest skeletal metastasis. The focal hypermetabolism seen previously in the posterior left acetabulum as resolved in the interval. Incidental CT findings: Cortical bone loss in the posterior left acetabulum associated with the hypermetabolic lesion previously has resolved in the interval. IMPRESSION: 1. Apparent interval mixed response to therapy. Right upper lobe pulmonary mass measures slightly larger today although hypermetabolism in  this lesion is mildly decreased in the interval. 2. Hypermetabolic lymph nodes in the high right paratracheal space and AP window on the previous PET-CT have resolved completely in the interval. Index hypermetabolic subcarinal metastatic lymphadenopathy on the previous study has decreased in the interval. 3. Interval development of 3 hypermetabolic foci in the right hilum, highly suspicious for new right hilar metastatic disease. 4. Interval resolution of hypermetabolic lesion posterior left acetabulum with restoration of cortical bone in the interval since prior study. 5. Cholelithiasis 6. Cirrhosis 7.  Aortic Atherosclerois (ICD10-170.0) Electronically  Signed   By: Misty Stanley M.D.   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 PD-L1 10%, with high tumor mutational burden.  PLAN:    1.  Stage IVb adenocarcinoma of the right upper lobe lung: PET scan results from November 26, 2020 reviewed independently and reported as above with likely progressive disease.  Areas of improvement are likely residual from previous when patient last received treatment with carboplatinum, pemetrexed, and Keytruda on Jun 22, 2019.  He only received 3 cycles of treatment and then subsequently was lost to follow-up.  After lengthy discussion with patient and his caretaker, he has agreed to pursue single agent Keytruda every 3 weeks.  Proceed with cycle 1 of Keytruda today.  Return to clinic in 3 weeks for further evaluation and consideration of cycle 2.  Repeat PET scan in 4 to 6 months.   2. 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  Patient received his last dose of weekly cisplatin on May 04, 2016.  PET scan as above. 3.  Social situation: Patient has a legal guardian. 4.  Anemia: Chronic and unchanged.  Patient's hemoglobin is stable at 10.4. 5.  Hypokalemia: Mild, monitor.   Patient expressed  understanding and was in agreement with this plan. He also understands that He can call clinic at any time with any questions, concerns, or complaints.    Cancer Staging Adenocarcinoma, lung, right [facility]) Staging form: Lung, AJCC 8th Edition - Clinical stage from 05/02/2019: Stage IVB (cT1c, cN2, cM1c) - Signed by Lloyd Huger, MD on 05/02/2019  Primary cancer of larynx [facility]) Staging form: Larynx - Supraglottis, AJCC 8th Edition - Clinical stage from 02/27/2016: Stage IVA (cT1, cN2b, cM0) - Signed by Lloyd Huger, MD on 02/27/2016 Laterality: Right   Lloyd Huger, MD 12/18/20 9:50 AM

## 2020-12-16 ENCOUNTER — Other Ambulatory Visit: Payer: Self-pay | Admitting: Emergency Medicine

## 2020-12-16 DIAGNOSIS — C3491 Malignant neoplasm of unspecified part of right bronchus or lung: Secondary | ICD-10-CM

## 2020-12-16 NOTE — Telephone Encounter (Signed)
Key [id number] has expired.  New authorization request submitted via Cover My Meds (Key: [id number])

## 2020-12-16 NOTE — Telephone Encounter (Signed)
PA approved and pharmacy notified.

## 2020-12-17 ENCOUNTER — Inpatient Hospital Stay (HOSPITAL_BASED_OUTPATIENT_CLINIC_OR_DEPARTMENT_OTHER): Payer: Medicare Other | Admitting: Oncology

## 2020-12-17 ENCOUNTER — Other Ambulatory Visit: Payer: Self-pay

## 2020-12-17 ENCOUNTER — Inpatient Hospital Stay: Payer: Medicare Other | Attending: Oncology

## 2020-12-17 ENCOUNTER — Encounter: Payer: Self-pay | Admitting: Oncology

## 2020-12-17 ENCOUNTER — Inpatient Hospital Stay: Payer: Medicare Other

## 2020-12-17 VITALS — BP 109/69 | HR 91 | Temp 98.1°F | Resp 16 | Wt 108.4 lb

## 2020-12-17 DIAGNOSIS — E876 Hypokalemia: Secondary | ICD-10-CM | POA: Diagnosis not present

## 2020-12-17 DIAGNOSIS — Z1329 Encounter for screening for other suspected endocrine disorder: Secondary | ICD-10-CM

## 2020-12-17 DIAGNOSIS — C3491 Malignant neoplasm of unspecified part of right bronchus or lung: Secondary | ICD-10-CM

## 2020-12-17 DIAGNOSIS — C3411 Malignant neoplasm of upper lobe, right bronchus or lung: Secondary | ICD-10-CM | POA: Insufficient documentation

## 2020-12-17 DIAGNOSIS — Z5112 Encounter for antineoplastic immunotherapy: Secondary | ICD-10-CM | POA: Insufficient documentation

## 2020-12-17 DIAGNOSIS — C7951 Secondary malignant neoplasm of bone: Secondary | ICD-10-CM | POA: Diagnosis not present

## 2020-12-17 DIAGNOSIS — Z79899 Other long term (current) drug therapy: Secondary | ICD-10-CM | POA: Insufficient documentation

## 2020-12-17 DIAGNOSIS — C329 Malignant neoplasm of larynx, unspecified: Secondary | ICD-10-CM

## 2020-12-17 DIAGNOSIS — Z95828 Presence of other vascular implants and grafts: Secondary | ICD-10-CM

## 2020-12-17 LAB — CBC WITH DIFFERENTIAL/PLATELET
Abs Immature Granulocytes: 0.04 10*3/uL (ref 0.00–0.07)
Basophils Absolute: 0 10*3/uL (ref 0.0–0.1)
Basophils Relative: 0 %
Eosinophils Absolute: 0.1 10*3/uL (ref 0.0–0.5)
Eosinophils Relative: 1 %
HCT: 30.6 % — ABNORMAL LOW (ref 39.0–52.0)
Hemoglobin: 10.4 g/dL — ABNORMAL LOW (ref 13.0–17.0)
Immature Granulocytes: 1 %
Lymphocytes Relative: 19 %
Lymphs Abs: 1.1 10*3/uL (ref 0.7–4.0)
MCH: 32.1 pg (ref 26.0–34.0)
MCHC: 34 g/dL (ref 30.0–36.0)
MCV: 94.4 fL (ref 80.0–100.0)
Monocytes Absolute: 0.7 10*3/uL (ref 0.1–1.0)
Monocytes Relative: 11 %
Neutro Abs: 4.1 10*3/uL (ref 1.7–7.7)
Neutrophils Relative %: 68 %
Platelets: 155 10*3/uL (ref 150–400)
RBC: 3.24 MIL/uL — ABNORMAL LOW (ref 4.22–5.81)
RDW: 14.4 % (ref 11.5–15.5)
WBC: 6 10*3/uL (ref 4.0–10.5)
nRBC: 0 % (ref 0.0–0.2)

## 2020-12-17 LAB — TSH: TSH: 0.55 u[IU]/mL (ref 0.350–4.500)

## 2020-12-17 LAB — COMPREHENSIVE METABOLIC PANEL
ALT: 26 U/L (ref 0–44)
AST: 32 U/L (ref 15–41)
Albumin: 2.7 g/dL — ABNORMAL LOW (ref 3.5–5.0)
Alkaline Phosphatase: 99 U/L (ref 38–126)
Anion gap: 7 (ref 5–15)
BUN: 14 mg/dL (ref 8–23)
CO2: 28 mmol/L (ref 22–32)
Calcium: 8.6 mg/dL — ABNORMAL LOW (ref 8.9–10.3)
Chloride: 101 mmol/L (ref 98–111)
Creatinine, Ser: 1.06 mg/dL (ref 0.61–1.24)
GFR, Estimated: >60 mL/min (ref >60)
Glucose, Bld: 116 mg/dL — ABNORMAL HIGH (ref 70–99)
Potassium: 3.3 mmol/L — ABNORMAL LOW (ref 3.5–5.1)
Sodium: 136 mmol/L (ref 135–145)
Total Bilirubin: 0.8 mg/dL (ref 0.3–1.2)
Total Protein: 6.8 g/dL (ref 6.5–8.1)

## 2020-12-17 MED ORDER — SODIUM CHLORIDE 0.9 % IV SOLN
Freq: Once | INTRAVENOUS | Status: AC
  Filled 2020-12-17: qty 250

## 2020-12-17 MED ORDER — PROCHLORPERAZINE MALEATE 10 MG PO TABS
10.0000 mg | ORAL_TABLET | Freq: Once | ORAL | Status: AC
  Administered 2020-12-17: 10 mg via ORAL
  Filled 2020-12-17: qty 1

## 2020-12-17 MED ORDER — HEPARIN SOD (PORK) LOCK FLUSH 100 UNIT/ML IV SOLN
500.0000 [IU] | Freq: Once | INTRAVENOUS | Status: DC
  Filled 2020-12-17: qty 5

## 2020-12-17 MED ORDER — SODIUM CHLORIDE 0.9 % IV SOLN
200.0000 mg | Freq: Once | INTRAVENOUS | Status: AC
  Administered 2020-12-17: 200 mg via INTRAVENOUS
  Filled 2020-12-17: qty 8

## 2020-12-17 MED ORDER — HEPARIN SOD (PORK) LOCK FLUSH 100 UNIT/ML IV SOLN
INTRAVENOUS | Status: AC
  Administered 2020-12-17: 500 [IU]
  Filled 2020-12-17: qty 5

## 2020-12-17 MED ORDER — SODIUM CHLORIDE 0.9% FLUSH
10.0000 mL | Freq: Once | INTRAVENOUS | Status: AC
  Administered 2020-12-17: 10 mL via INTRAVENOUS
  Filled 2020-12-17: qty 10

## 2020-12-17 MED ORDER — HEPARIN SOD (PORK) LOCK FLUSH 100 UNIT/ML IV SOLN
500.0000 [IU] | Freq: Once | INTRAVENOUS | Status: AC | PRN
  Filled 2020-12-17: qty 5

## 2020-12-17 MED ORDER — SODIUM CHLORIDE 0.9% FLUSH
10.0000 mL | INTRAVENOUS | Status: DC | PRN
  Administered 2020-12-17: 10 mL
  Filled 2020-12-17: qty 10

## 2020-12-17 NOTE — Patient Instructions (Signed)
[facility]  Discharge Instructions: Thank you for choosing [facility] to provide your oncology and hematology care.  If you have a lab appointment with the [facility], please go directly to the [facility] and check in at the registration area.  Wear comfortable clothing and clothing appropriate for easy access to any Portacath or PICC line.   We strive to give you quality time with your provider. You may need to reschedule your appointment if you arrive late (15 or more minutes).  Arriving late affects you and other patients whose appointments are after yours.  Also, if you miss three or more appointments without notifying the office, you may be dismissed from the clinic at the provider's discretion.      For prescription refill requests, have your pharmacy contact our office and allow 72 hours for refills to be completed.    Today you received the following chemotherapy and/or immunotherapy agents - pembrolizumab      To help prevent nausea and vomiting after your treatment, we encourage you to take your nausea medication as directed.  BELOW ARE SYMPTOMS THAT SHOULD BE REPORTED IMMEDIATELY: *FEVER GREATER THAN 100.4 F (38 C) OR HIGHER *CHILLS OR SWEATING *NAUSEA AND VOMITING THAT IS NOT CONTROLLED WITH YOUR NAUSEA MEDICATION *UNUSUAL SHORTNESS OF BREATH *UNUSUAL BRUISING OR BLEEDING *URINARY PROBLEMS (pain or burning when urinating, or frequent urination) *BOWEL PROBLEMS (unusual diarrhea, constipation, pain near the anus) TENDERNESS IN MOUTH AND THROAT WITH OR WITHOUT PRESENCE OF ULCERS (sore throat, sores in mouth, or a toothache) UNUSUAL RASH, SWELLING OR PAIN  UNUSUAL VAGINAL DISCHARGE OR ITCHING   Items with * indicate a potential emergency and should be followed up as soon as possible or go to the Emergency Department if any problems should occur.  Please show the CHEMOTHERAPY ALERT CARD or IMMUNOTHERAPY ALERT CARD at  check-in to the Emergency Department and triage nurse.  Should you have questions after your visit or need to cancel or reschedule your appointment, please contact [facility]  [phone] and follow the prompts.  Office hours are 8:00 a.m. to 4:30 p.m. Monday - Friday. Please note that voicemails left after 4:00 p.m. may not be returned until the following business day.  We are closed weekends and major holidays. You have access to a nurse at all times for urgent questions. Please call the main number to the clinic [phone] and follow the prompts.  For any non-urgent questions, you may also contact your provider using MyChart. We now offer e-Visits for anyone 45 and older to request care online for non-urgent symptoms. For details visit [email or web address].   Also download the MyChart app! Go to the app store, search "MyChart", open the app, select , and log in with your MyChart username and password.  Due to Covid, a mask is required upon entering the hospital/clinic. If you do not have a mask, one will be given to you upon arrival. For doctor visits, patients may have 1 support person aged 42 or older with them. For treatment visits, patients cannot have anyone with them due to current Covid guidelines and our immunocompromised population.   Pembrolizumab injection What is this medication? PEMBROLIZUMAB (pem broe liz ue mab) is a monoclonal antibody. It is used to treat certain types of cancer. This medicine may be used for other purposes; ask your health care provider or pharmacist if you have questions. COMMON BRAND NAME(S): Keytruda What should I tell my care  team before I take this medication? They need to know if you have any of these conditions: autoimmune diseases like Crohn's disease, ulcerative colitis, or lupus have had or planning to have an allogeneic stem cell transplant (uses someone else's stem cells) history of organ  transplant history of chest radiation nervous system problems like myasthenia gravis or Guillain-Barre syndrome an unusual or allergic reaction to pembrolizumab, other medicines, foods, dyes, or preservatives pregnant or trying to get pregnant breast-feeding How should I use this medication? This medicine is for infusion into a vein. It is given by a health care professional in a hospital or clinic setting. A special MedGuide will be given to you before each treatment. Be sure to read this information carefully each time. Talk to your pediatrician regarding the use of this medicine in children. While this drug may be prescribed for children as young as 6 months for selected conditions, precautions do apply. Overdosage: If you think you have taken too much of this medicine contact a poison control center or emergency room at once. NOTE: This medicine is only for you. Do not share this medicine with others. What if I miss a dose? It is important not to miss your dose. Call your doctor or health care professional if you are unable to keep an appointment. What may interact with this medication? Interactions have not been studied. This list may not describe all possible interactions. Give your health care provider a list of all the medicines, herbs, non-prescription drugs, or dietary supplements you use. Also tell them if you smoke, drink alcohol, or use illegal drugs. Some items may interact with your medicine. What should I watch for while using this medication? Your condition will be monitored carefully while you are receiving this medicine. You may need blood work done while you are taking this medicine. Do not become pregnant while taking this medicine or for 4 months after stopping it. Women should inform their doctor if they wish to become pregnant or think they might be pregnant. There is a potential for serious side effects to an unborn child. Talk to your health care professional or  pharmacist for more information. Do not breast-feed an infant while taking this medicine or for 4 months after the last dose. What side effects may I notice from receiving this medication? Side effects that you should report to your doctor or health care professional as soon as possible: allergic reactions like skin rash, itching or hives, swelling of the face, lips, or tongue bloody or black, tarry breathing problems changes in vision chest pain chills confusion constipation cough diarrhea dizziness or feeling faint or lightheaded fast or irregular heartbeat fever flushing joint pain low blood counts - this medicine may decrease the number of white blood cells, red blood cells and platelets. You may be at increased risk for infections and bleeding. muscle pain muscle weakness pain, tingling, numbness in the hands or feet persistent headache redness, blistering, peeling or loosening of the skin, including inside the mouth signs and symptoms of high blood sugar such as dizziness; dry mouth; dry skin; fruity breath; nausea; stomach pain; increased hunger or thirst; increased urination signs and symptoms of kidney injury like trouble passing urine or change in the amount of urine signs and symptoms of liver injury like dark urine, light-colored stools, loss of appetite, nausea, right upper belly pain, yellowing of the eyes or skin sweating swollen lymph nodes weight loss Side effects that usually do not require medical attention (report to your doctor  or health care professional if they continue or are bothersome): decreased appetite hair loss tiredness This list may not describe all possible side effects. Call your doctor for medical advice about side effects. You may report side effects to FDA at 1-800-FDA-1088. Where should I keep my medication? This drug is given in a hospital or clinic and will not be stored at home. NOTE: This sheet is a summary. It may not cover all possible  information. If you have questions about this medicine, talk to your doctor, pharmacist, or health care provider.  2022 Elsevier/Gold Standard (2019-01-04 21:44:53)

## 2020-12-17 NOTE — Progress Notes (Signed)
Patient denies new problems/concerns today.  Does have a 8 lb wt loss since 12/03/20 and reports a good appetite.

## 2020-12-18 ENCOUNTER — Encounter: Payer: Self-pay | Admitting: Oncology

## 2021-01-06 ENCOUNTER — Other Ambulatory Visit: Payer: Self-pay | Admitting: Emergency Medicine

## 2021-01-06 DIAGNOSIS — C3491 Malignant neoplasm of unspecified part of right bronchus or lung: Secondary | ICD-10-CM

## 2021-01-06 NOTE — Progress Notes (Signed)
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  PDL-1 10%, high TMB.  INTERVAL HISTORY: Patient returns to clinic today for further evaluation and consideration of cycle 2 of single agent Keytruda.  He tolerated his first treatment well without significant side effects.  He currently feels well and is asymptomatic.  He has no neurologic complaints.  He denies any pain.  He has a good appetite and denies weight loss.  He has no chest pain, shortness of breath, cough, or hemoptysis.  He denies any nausea, vomiting, constipation, or diarrhea.  He has no urinary complaints.  Patient offers no specific complaints today.  REVIEW OF SYSTEMS:    Review of Systems  Constitutional: Negative.  Negative for fever, malaise/fatigue and weight loss.  HENT: Negative.  Negative for sore throat.   Respiratory: Negative.  Negative for cough and shortness of breath.   Cardiovascular: Negative.  Negative for chest pain and leg swelling.  Gastrointestinal: Negative.  Negative for abdominal pain.  Genitourinary: Negative.  Negative for dysuria.  Musculoskeletal: Negative.  Negative for back pain.  Skin: Negative.  Negative for rash.  Neurological: Negative.  Negative for dizziness, sensory change, focal weakness, weakness and headaches.  Psychiatric/Behavioral: Negative.  Negative for substance abuse. The patient does not have insomnia.    As per HPI. Otherwise, a complete review of systems is negative.  PAST MEDICAL HISTORY: Past Medical History:  Diagnosis Date   Alcohol abuse    Cirrhosis ([place])    COPD (chronic obstructive pulmonary  disease) (HCC)    Dementia (HCC)    Hepatitis C    Malnutrition ([place])    Throat cancer ([place])    Lung Cancer (presently)  History of throat cancer    PAST SURGICAL HISTORY: Past Surgical History:  Procedure Laterality Date   ESOPHAGOGASTRODUODENOSCOPY (EGD) WITH PROPOFOL N/A 08/03/2019   Procedure: ESOPHAGOGASTRODUODENOSCOPY (EGD) WITH PROPOFOL;  Surgeon: Lin Landsman, MD;  Location: [facility] ENDOSCOPY;  Service: Gastroenterology;  Laterality: N/A;   IR IMAGING GUIDED PORT INSERTION  04/28/2019   TONSILLECTOMY     age was 81   VASECTOMY      FAMILY HISTORY: Family History  Problem Relation Age of Onset   Breast cancer Mother     ADVANCED DIRECTIVES (Y/N):  N  HEALTH MAINTENANCE: Social History   Tobacco Use   Smoking status: Every Day    Packs/day: 0.50    Years: 63.00    Pack years: 31.50    Types: Cigarettes   Smokeless tobacco: Never  Vaping Use   Vaping Use: Never used  Substance Use Topics   Alcohol use: Not Currently    Alcohol/week: 1.0 standard drink    Types: 1 Cans of beer per week   Drug use: No     Colonoscopy:  PAP:  Bone density:  Lipid panel:  Allergies  Allergen Reactions   Penicillins Other (See Comments)    Reaction: unknown Patient is not able to answer follow up questions    Current Outpatient Medications  Medication Sig Dispense Refill   buPROPion (WELLBUTRIN SR) 150 MG 12 hr tablet bupropion HCl SR 150 mg tablet,12 hr sustained-release     docusate (COLACE) 50  MG/5ML liquid Take 100 mg by mouth daily.     ferrous sulfate 325 (65 FE) MG tablet Take 325 mg by mouth daily with breakfast.     folic acid (FOLVITE) 1 MG tablet Take 1 mg by mouth daily.     hydrOXYzine (ATARAX/VISTARIL) 25 MG tablet Take 50 mg by mouth at bedtime.     hydrOXYzine (ATARAX/VISTARIL) 50 MG tablet Take 50 mg by mouth 2 (two) times daily.     lactose free nutrition (BOOST PLUS) LIQD Take 237 mLs by mouth 4 (four) times daily.     levothyroxine (SYNTHROID)  100 MCG tablet Take 100 mcg by mouth daily before breakfast.     lidocaine-prilocaine (EMLA) cream Apply 1 application topically as needed. Apply to port and cover with saran wrap 1-2 hours prior to port access 30 g 1   mirtazapine (REMERON) 15 MG tablet Take 15 mg by mouth at bedtime.     mupirocin ointment (BACTROBAN) 2 % Apply 1 application topically 2 (two) times daily.     ondansetron (ZOFRAN) 8 MG tablet Take by mouth every 8 (eight) hours as needed for nausea or vomiting.     polyethylene glycol (MIRALAX / GLYCOLAX) 17 g packet Take 17 g by mouth daily.     prochlorperazine (COMPAZINE) 10 MG tablet Take 1 tablet (10 mg total) by mouth every 6 (six) hours as needed (Nausea or vomiting). 60 tablet 2   QUEtiapine (SEROQUEL) 25 MG tablet Take 25 mg by mouth 3 (three) times daily.     QUEtiapine (SEROQUEL) 50 MG tablet Take 1 tablet (50 mg total) by mouth at bedtime. (Patient taking differently: Take 50 mg by mouth at bedtime. Takes with a 25 mg tablet) 30 tablet 0   spironolactone (ALDACTONE) 50 MG tablet TAKE 1 TABLET BY MOUTH EVERY DAY 90 tablet 1   tamsulosin (FLOMAX) 0.4 MG CAPS capsule Take 0.8 mg by mouth daily after breakfast.      thiamine 100 MG tablet Take 100 mg by mouth daily.     TIROSINT-SOL 150 MCG/ML SOLN Take by mouth.     TRELEGY ELLIPTA 100-62.5-25 MCG/INH AEPB Inhale 1 puff into the lungs in the morning.      vitamin C (ASCORBIC ACID) 250 MG tablet Take 500 mg by mouth daily.      Vitamin D, Ergocalciferol, (DRISDOL) 1.25 MG (50000 UNIT) CAPS capsule Take 50,000 Units by mouth every 7 (seven) days.     Cholecalciferol (VITAMIN D3) 5000 units TABS Take 5,000 Units by mouth daily. (Patient not taking: Reported on 11/19/2020)     furosemide (LASIX) 40 MG tablet Take 1 tablet (40 mg total) by mouth daily. 90 tablet 1   No current facility-administered medications for this visit.   Facility-Administered Medications Ordered in Other Visits  Medication Dose Route Frequency  Provider Last Rate Last Admin   heparin lock flush 100 unit/mL  500 Units Intravenous Once Lloyd Huger, MD        OBJECTIVE: Vitals:   01/07/21 0910  BP: (!) 99/58  Pulse: 90  Temp: (!) 96.8 F (36 C)     Body mass index is 18.24 kg/m.    ECOG FS:0 - Asymptomatic  General: Thin, no acute distress. Eyes: Pink conjunctiva, anicteric sclera. HEENT: Normocephalic, moist mucous membranes. Lungs: No audible wheezing or coughing. Heart: Regular rate and rhythm. Abdomen: Soft, nontender, no obvious distention. Musculoskeletal: No edema, cyanosis, or clubbing. Neuro: Alert, answering all questions appropriately. Cranial nerves grossly intact. Skin: No rashes  or petechiae noted. Psych: Normal affect.   LAB RESULTS:  Lab Results  Component Value Date   NA 134 (L) 01/07/2021   K 3.7 01/07/2021   CL 103 01/07/2021   CO2 26 01/07/2021   GLUCOSE 155 (H) 01/07/2021   BUN 18 01/07/2021   CREATININE 1.05 01/07/2021   CALCIUM 8.6 (L) 01/07/2021   PROT 6.8 01/07/2021   ALBUMIN 2.7 (L) 01/07/2021   AST 69 (H) 01/07/2021   ALT 58 (H) 01/07/2021   ALKPHOS 95 01/07/2021   BILITOT 0.8 01/07/2021   GFRNONAA >60 01/07/2021   GFRAA 74 04/09/2020    Lab Results  Component Value Date   WBC 4.2 01/07/2021   NEUTROABS 2.8 01/07/2021   HGB 10.3 (L) 01/07/2021   HCT 30.2 (L) 01/07/2021   MCV 94.7 01/07/2021   PLT 144 (L) 01/07/2021     STUDIES: No results found.   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 PD-L1 10%, with high tumor mutational burden.  PLAN:    1.  Stage IVb adenocarcinoma of the right upper lobe lung: PET scan results from November 26, 2020 reviewed independently and reported as above with likely progressive disease.  Areas of improvement are likely residual from previous when patient last received treatment with carboplatinum, pemetrexed, and Keytruda on Jun 22, 2019.  He only received 3 cycles of treatment and  then subsequently was lost to follow-up.  After lengthy discussion with patient and his caretaker, he has agreed to pursue single agent Keytruda every 3 weeks.  Proceed with cycle 2 of treatment today.  Return to clinic in 3 weeks for further evaluation and consideration of cycle 3.  Repeat PET scan in 4 to 6 months.   2. 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  Patient received his last dose of weekly cisplatin on May 04, 2016.  PET scan as above. 3.  Social situation: Patient has a legal guardian. 4.  Anemia: Chronic and unchanged.  Patient's hemoglobin is 10.3. 5.  Hypokalemia: Resolved.  6.  Transaminitis: Mild, monitor.   Patient expressed understanding and was in agreement with this plan. He also understands that He can call clinic at any time with any questions, concerns, or complaints.     Cancer Staging  Adenocarcinoma, lung, right [facility]) Staging form: Lung, AJCC 8th Edition - Clinical stage from 05/02/2019: Stage IVB (cT1c, cN2, cM1c) - Signed by Lloyd Huger, MD on 05/02/2019  Primary cancer of larynx [facility]) Staging form: Larynx - Supraglottis, AJCC 8th Edition - Clinical stage from 02/27/2016: Stage IVA (cT1, cN2b, cM0) - Signed by Lloyd Huger, MD on 02/27/2016 Laterality: Right   Lloyd Huger, MD 01/07/21 12:25 PM

## 2021-01-07 ENCOUNTER — Other Ambulatory Visit: Payer: Self-pay

## 2021-01-07 ENCOUNTER — Inpatient Hospital Stay (HOSPITAL_BASED_OUTPATIENT_CLINIC_OR_DEPARTMENT_OTHER): Payer: Medicare Other | Admitting: Oncology

## 2021-01-07 ENCOUNTER — Inpatient Hospital Stay: Payer: Medicare Other

## 2021-01-07 ENCOUNTER — Encounter: Payer: Self-pay | Admitting: Oncology

## 2021-01-07 VITALS — BP 99/58 | HR 90 | Temp 96.8°F | Wt 113.0 lb

## 2021-01-07 DIAGNOSIS — C3491 Malignant neoplasm of unspecified part of right bronchus or lung: Secondary | ICD-10-CM | POA: Diagnosis not present

## 2021-01-07 DIAGNOSIS — Z5112 Encounter for antineoplastic immunotherapy: Secondary | ICD-10-CM | POA: Diagnosis not present

## 2021-01-07 LAB — COMPREHENSIVE METABOLIC PANEL
ALT: 58 U/L — ABNORMAL HIGH (ref 0–44)
AST: 69 U/L — ABNORMAL HIGH (ref 15–41)
Albumin: 2.7 g/dL — ABNORMAL LOW (ref 3.5–5.0)
Alkaline Phosphatase: 95 U/L (ref 38–126)
Anion gap: 5 (ref 5–15)
BUN: 18 mg/dL (ref 8–23)
CO2: 26 mmol/L (ref 22–32)
Calcium: 8.6 mg/dL — ABNORMAL LOW (ref 8.9–10.3)
Chloride: 103 mmol/L (ref 98–111)
Creatinine, Ser: 1.05 mg/dL (ref 0.61–1.24)
GFR, Estimated: >60 mL/min (ref >60)
Glucose, Bld: 155 mg/dL — ABNORMAL HIGH (ref 70–99)
Potassium: 3.7 mmol/L (ref 3.5–5.1)
Sodium: 134 mmol/L — ABNORMAL LOW (ref 135–145)
Total Bilirubin: 0.8 mg/dL (ref 0.3–1.2)
Total Protein: 6.8 g/dL (ref 6.5–8.1)

## 2021-01-07 LAB — CBC WITH DIFFERENTIAL/PLATELET
Abs Immature Granulocytes: 0.03 10*3/uL (ref 0.00–0.07)
Basophils Absolute: 0 10*3/uL (ref 0.0–0.1)
Basophils Relative: 1 %
Eosinophils Absolute: 0.1 10*3/uL (ref 0.0–0.5)
Eosinophils Relative: 2 %
HCT: 30.2 % — ABNORMAL LOW (ref 39.0–52.0)
Hemoglobin: 10.3 g/dL — ABNORMAL LOW (ref 13.0–17.0)
Immature Granulocytes: 1 %
Lymphocytes Relative: 19 %
Lymphs Abs: 0.8 10*3/uL (ref 0.7–4.0)
MCH: 32.3 pg (ref 26.0–34.0)
MCHC: 34.1 g/dL (ref 30.0–36.0)
MCV: 94.7 fL (ref 80.0–100.0)
Monocytes Absolute: 0.4 10*3/uL (ref 0.1–1.0)
Monocytes Relative: 10 %
Neutro Abs: 2.8 10*3/uL (ref 1.7–7.7)
Neutrophils Relative %: 67 %
Platelets: 144 10*3/uL — ABNORMAL LOW (ref 150–400)
RBC: 3.19 MIL/uL — ABNORMAL LOW (ref 4.22–5.81)
RDW: 14.9 % (ref 11.5–15.5)
WBC: 4.2 10*3/uL (ref 4.0–10.5)
nRBC: 0 % (ref 0.0–0.2)

## 2021-01-07 MED ORDER — HEPARIN SOD (PORK) LOCK FLUSH 100 UNIT/ML IV SOLN
500.0000 [IU] | Freq: Once | INTRAVENOUS | Status: AC | PRN
  Filled 2021-01-07: qty 5

## 2021-01-07 MED ORDER — HEPARIN SOD (PORK) LOCK FLUSH 100 UNIT/ML IV SOLN
INTRAVENOUS | Status: AC
  Administered 2021-01-07: 500 [IU]
  Filled 2021-01-07: qty 5

## 2021-01-07 MED ORDER — SODIUM CHLORIDE 0.9 % IV SOLN
Freq: Once | INTRAVENOUS | Status: AC
Start: 2021-01-07 — End: 2021-01-07
  Filled 2021-01-07: qty 250

## 2021-01-07 MED ORDER — SODIUM CHLORIDE 0.9 % IV SOLN
200.0000 mg | Freq: Once | INTRAVENOUS | Status: AC
  Administered 2021-01-07: 200 mg via INTRAVENOUS
  Filled 2021-01-07: qty 8

## 2021-01-07 MED ORDER — PROCHLORPERAZINE MALEATE 10 MG PO TABS
10.0000 mg | ORAL_TABLET | Freq: Once | ORAL | Status: AC
  Administered 2021-01-07: 10 mg via ORAL
  Filled 2021-01-07: qty 1

## 2021-01-07 NOTE — Patient Instructions (Signed)
[facility]  Discharge Instructions: Thank you for choosing [facility] to provide your oncology and hematology care.  If you have a lab appointment with the [facility], please go directly to the [facility] and check in at the registration area.  Wear comfortable clothing and clothing appropriate for easy access to any Portacath or PICC line.   We strive to give you quality time with your provider. You may need to reschedule your appointment if you arrive late (15 or more minutes).  Arriving late affects you and other patients whose appointments are after yours.  Also, if you miss three or more appointments without notifying the office, you may be dismissed from the clinic at the provider's discretion.      For prescription refill requests, have your pharmacy contact our office and allow 72 hours for refills to be completed.    Today you received the following chemotherapy and/or immunotherapy agents Beryle Flock    To help prevent nausea and vomiting after your treatment, we encourage you to take your nausea medication as directed.  BELOW ARE SYMPTOMS THAT SHOULD BE REPORTED IMMEDIATELY: *FEVER GREATER THAN 100.4 F (38 C) OR HIGHER *CHILLS OR SWEATING *NAUSEA AND VOMITING THAT IS NOT CONTROLLED WITH YOUR NAUSEA MEDICATION *UNUSUAL SHORTNESS OF BREATH *UNUSUAL BRUISING OR BLEEDING *URINARY PROBLEMS (pain or burning when urinating, or frequent urination) *BOWEL PROBLEMS (unusual diarrhea, constipation, pain near the anus) TENDERNESS IN MOUTH AND THROAT WITH OR WITHOUT PRESENCE OF ULCERS (sore throat, sores in mouth, or a toothache) UNUSUAL RASH, SWELLING OR PAIN  UNUSUAL VAGINAL DISCHARGE OR ITCHING   Items with * indicate a potential emergency and should be followed up as soon as possible or go to the Emergency Department if any problems should occur.  Please show the CHEMOTHERAPY ALERT CARD or IMMUNOTHERAPY ALERT CARD at check-in to  the Emergency Department and triage nurse.  Should you have questions after your visit or need to cancel or reschedule your appointment, please contact [facility]  [phone] and follow the prompts.  Office hours are 8:00 a.m. to 4:30 p.m. Monday - Friday. Please note that voicemails left after 4:00 p.m. may not be returned until the following business day.  We are closed weekends and major holidays. You have access to a nurse at all times for urgent questions. Please call the main number to the clinic [phone] and follow the prompts.  For any non-urgent questions, you may also contact your provider using MyChart. We now offer e-Visits for anyone 77 and older to request care online for non-urgent symptoms. For details visit [email or web address].   Also download the MyChart app! Go to the app store, search "MyChart", open the app, select [facility], and log in with your MyChart username and password.  Due to Covid, a mask is required upon entering the hospital/clinic. If you do not have a mask, one will be given to you upon arrival. For doctor visits, patients may have 1 support person aged 28 or older with them. For treatment visits, patients cannot have anyone with them due to current Covid guidelines and our immunocompromised population.

## 2021-01-07 NOTE — Progress Notes (Signed)
Nutrition  RD planning to see patient during infusion today but when RD arrived to infusion area patient was already discharged.  Will attempt to see at next infusion  Chasin Findling B. Zenia Resides, [place], [place] Registered Dietitian [phone] (mobile)

## 2021-01-23 ENCOUNTER — Other Ambulatory Visit: Payer: Self-pay | Admitting: *Deleted

## 2021-01-23 DIAGNOSIS — Z1329 Encounter for screening for other suspected endocrine disorder: Secondary | ICD-10-CM

## 2021-01-23 DIAGNOSIS — C3491 Malignant neoplasm of unspecified part of right bronchus or lung: Secondary | ICD-10-CM

## 2021-01-27 NOTE — Progress Notes (Signed)
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  PDL-1 10%, high TMB.  INTERVAL HISTORY: Patient returns to clinic today for further evaluation and consideration of cycle 3 of single agent Keytruda.  He currently feels well and is asymptomatic.  He is tolerating his treatments well without significant side effects. He has no neurologic complaints.  He denies any pain.  He has a good appetite and denies weight loss.  He has no chest pain, shortness of breath, cough, or hemoptysis.  He denies any nausea, vomiting, constipation, or diarrhea.  He has no urinary complaints.  Patient offers no specific complaints today.  REVIEW OF SYSTEMS:    Review of Systems  Constitutional: Negative.  Negative for fever, malaise/fatigue and weight loss.  HENT: Negative.  Negative for sore throat.   Respiratory: Negative.  Negative for cough and shortness of breath.   Cardiovascular: Negative.  Negative for chest pain and leg swelling.  Gastrointestinal: Negative.  Negative for abdominal pain.  Genitourinary: Negative.  Negative for dysuria.  Musculoskeletal: Negative.  Negative for back pain.  Skin: Negative.  Negative for rash.  Neurological: Negative.  Negative for dizziness, sensory change, focal weakness, weakness and headaches.  Psychiatric/Behavioral: Negative.  Negative for substance abuse. The patient does not have insomnia.    As per HPI. Otherwise, a complete review of systems is negative.  PAST MEDICAL HISTORY: Past Medical History:  Diagnosis Date   Alcohol abuse    Cirrhosis ([place])    COPD (chronic obstructive pulmonary  disease) (HCC)    Dementia (HCC)    Hepatitis C    Malnutrition ([place])    Throat cancer ([place])    Lung Cancer (presently)  History of throat cancer    PAST SURGICAL HISTORY: Past Surgical History:  Procedure Laterality Date   ESOPHAGOGASTRODUODENOSCOPY (EGD) WITH PROPOFOL N/A 08/03/2019   Procedure: ESOPHAGOGASTRODUODENOSCOPY (EGD) WITH PROPOFOL;  Surgeon: Lin Landsman, MD;  Location: [facility] ENDOSCOPY;  Service: Gastroenterology;  Laterality: N/A;   IR IMAGING GUIDED PORT INSERTION  04/28/2019   TONSILLECTOMY     age was 52   VASECTOMY      FAMILY HISTORY: Family History  Problem Relation Age of Onset   Breast cancer Mother     ADVANCED DIRECTIVES (Y/N):  N  HEALTH MAINTENANCE: Social History   Tobacco Use   Smoking status: Every Day    Packs/day: 0.50    Years: 63.00    Pack years: 31.50    Types: Cigarettes   Smokeless tobacco: Never  Vaping Use   Vaping Use: Never used  Substance Use Topics   Alcohol use: Not Currently    Alcohol/week: 1.0 standard drink    Types: 1 Cans of beer per week   Drug use: No     Colonoscopy:  PAP:  Bone density:  Lipid panel:  Allergies  Allergen Reactions   Penicillins Other (See Comments)    Reaction: unknown Patient is not able to answer follow up questions    Current Outpatient Medications  Medication Sig Dispense Refill   ADLARITY 5 MG/DAY PTWK Place onto the skin.     buPROPion (WELLBUTRIN SR) 150 MG 12 hr tablet bupropion HCl SR 150  mg tablet,12 hr sustained-release     docusate (COLACE) 50 MG/5ML liquid Take 100 mg by mouth daily.     ferrous sulfate 325 (65 FE) MG tablet Take 325 mg by mouth daily with breakfast.     fluticasone (FLONASE) 50 MCG/ACT nasal spray Place into both nostrils.     folic acid (FOLVITE) 1 MG tablet Take 1 mg by mouth daily.     hydrOXYzine (ATARAX/VISTARIL) 25 MG tablet Take 50 mg by mouth at bedtime.     hydrOXYzine (ATARAX/VISTARIL) 50 MG tablet Take 50 mg by mouth 2 (two) times  daily.     lactose free nutrition (BOOST PLUS) LIQD Take 237 mLs by mouth 4 (four) times daily.     levothyroxine (SYNTHROID) 100 MCG tablet Take 100 mcg by mouth daily before breakfast.     lidocaine-prilocaine (EMLA) cream Apply 1 application topically as needed. Apply to port and cover with saran wrap 1-2 hours prior to port access 30 g 1   mirtazapine (REMERON) 15 MG tablet Take 15 mg by mouth at bedtime.     mupirocin ointment (BACTROBAN) 2 % Apply 1 application topically 2 (two) times daily.     ondansetron (ZOFRAN) 8 MG tablet Take by mouth every 8 (eight) hours as needed for nausea or vomiting.     polyethylene glycol (MIRALAX / GLYCOLAX) 17 g packet Take 17 g by mouth daily.     prochlorperazine (COMPAZINE) 10 MG tablet Take 1 tablet (10 mg total) by mouth every 6 (six) hours as needed (Nausea or vomiting). 60 tablet 2   QUEtiapine (SEROQUEL) 25 MG tablet Take 25 mg by mouth 3 (three) times daily.     QUEtiapine (SEROQUEL) 50 MG tablet Take 1 tablet (50 mg total) by mouth at bedtime. (Patient taking differently: Take 50 mg by mouth at bedtime. Takes with a 25 mg tablet) 30 tablet 0   spironolactone (ALDACTONE) 50 MG tablet TAKE 1 TABLET BY MOUTH EVERY DAY 90 tablet 1   tamsulosin (FLOMAX) 0.4 MG CAPS capsule Take 0.8 mg by mouth daily after breakfast.      thiamine 100 MG tablet Take 100 mg by mouth daily.     TIROSINT-SOL 150 MCG/ML SOLN Take by mouth.     TRELEGY ELLIPTA 100-62.5-25 MCG/INH AEPB Inhale 1 puff into the lungs in the morning.      vitamin C (ASCORBIC ACID) 250 MG tablet Take 500 mg by mouth daily.      Vitamin D, Ergocalciferol, (DRISDOL) 1.25 MG (50000 UNIT) CAPS capsule Take 50,000 Units by mouth every 7 (seven) days.     Cholecalciferol (VITAMIN D3) 5000 units TABS Take 5,000 Units by mouth daily. (Patient not taking: Reported on 11/19/2020)     furosemide (LASIX) 40 MG tablet Take 1 tablet (40 mg total) by mouth daily. 90 tablet 1   No current facility-administered  medications for this visit.   Facility-Administered Medications Ordered in Other Visits  Medication Dose Route Frequency Provider Last Rate Last Admin   heparin lock flush 100 unit/mL  500 Units Intravenous Once Lloyd Huger, MD        OBJECTIVE: Vitals:   01/28/21 0926  BP: 111/64  Pulse: 74  Resp: 18  SpO2: 99%     Body mass index is 17.92 kg/m.    ECOG FS:0 - Asymptomatic  General: Thin, no acute distress. Eyes: Pink conjunctiva, anicteric sclera. HEENT: Normocephalic, moist mucous membranes. Lungs: No audible wheezing or coughing. Heart: Regular rate and rhythm. Abdomen: Soft,  nontender, no obvious distention. Musculoskeletal: No edema, cyanosis, or clubbing. Neuro: Alert, answering all questions appropriately. Cranial nerves grossly intact. Skin: No rashes or petechiae noted. Psych: Normal affect.  LAB RESULTS:  Lab Results  Component Value Date   NA 135 01/28/2021   K 3.7 01/28/2021   CL 102 01/28/2021   CO2 26 01/28/2021   GLUCOSE 113 (H) 01/28/2021   BUN 20 01/28/2021   CREATININE 1.20 01/28/2021   CALCIUM 8.9 01/28/2021   PROT 7.1 01/28/2021   ALBUMIN 2.8 (L) 01/28/2021   AST 101 (H) 01/28/2021   ALT 94 (H) 01/28/2021   ALKPHOS 116 01/28/2021   BILITOT 1.0 01/28/2021   GFRNONAA >60 01/28/2021   GFRAA 74 04/09/2020    Lab Results  Component Value Date   WBC 5.3 01/28/2021   NEUTROABS 3.5 01/28/2021   HGB 10.7 (L) 01/28/2021   HCT 31.3 (L) 01/28/2021   MCV 95.1 01/28/2021   PLT 141 (L) 01/28/2021     STUDIES: No results found.   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 PD-L1 10%, with high tumor mutational burden.  PLAN:    1.  Stage IVb adenocarcinoma of the right upper lobe lung: PET scan results from November 26, 2020 reviewed independently and reported as above with likely progressive disease.  Areas of improvement are likely residual from previous when patient last received treatment  with carboplatinum, pemetrexed, and Keytruda on Jun 22, 2019.  He only received 3 cycles of treatment and then subsequently was lost to follow-up.  After lengthy discussion with patient and his caretaker, he has agreed to pursue single agent Keytruda every 3 weeks.  Proceed with cycle 3 of treatment today.  Return to clinic in 3 weeks for treatment only and then in 6 weeks for further evaluation and consideration of cycle 5.   Repeat PET scan in 4 to 6 months.   2. 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  Patient received his last dose of weekly cisplatin on May 04, 2016.  PET scan as above. 3.  Social situation: Patient has a legal guardian. 4.  Anemia: Chronic and unchanged.  Patient's hemoglobin is 10.7 today.  5.  Hypokalemia: Resolved.  6.  Transaminitis: AST and ALT have trended up slightly.  Proceed with treatment as above.   Patient expressed understanding and was in agreement with this plan. He also understands that He can call clinic at any time with any questions, concerns, or complaints.     Cancer Staging  Adenocarcinoma, lung, right [facility]) Staging form: Lung, AJCC 8th Edition - Clinical stage from 05/02/2019: Stage IVB (cT1c, cN2, cM1c) - Signed by Lloyd Huger, MD on 05/02/2019  Primary cancer of larynx [facility]) Staging form: Larynx - Supraglottis, AJCC 8th Edition - Clinical stage from 02/27/2016: Stage IVA (cT1, cN2b, cM0) - Signed by Lloyd Huger, MD on 02/27/2016 Laterality: Right   Lloyd Huger, MD 01/28/21 12:43 PM

## 2021-01-28 ENCOUNTER — Other Ambulatory Visit: Payer: Self-pay

## 2021-01-28 ENCOUNTER — Inpatient Hospital Stay: Payer: Medicare Other

## 2021-01-28 ENCOUNTER — Inpatient Hospital Stay: Payer: Medicare Other | Attending: Oncology | Admitting: Oncology

## 2021-01-28 VITALS — BP 111/64 | HR 74 | Resp 18 | Wt 111.0 lb

## 2021-01-28 VITALS — Temp 97.8°F

## 2021-01-28 DIAGNOSIS — C3491 Malignant neoplasm of unspecified part of right bronchus or lung: Secondary | ICD-10-CM

## 2021-01-28 DIAGNOSIS — Z95828 Presence of other vascular implants and grafts: Secondary | ICD-10-CM

## 2021-01-28 DIAGNOSIS — Z5112 Encounter for antineoplastic immunotherapy: Secondary | ICD-10-CM | POA: Diagnosis present

## 2021-01-28 DIAGNOSIS — Z79899 Other long term (current) drug therapy: Secondary | ICD-10-CM | POA: Diagnosis not present

## 2021-01-28 DIAGNOSIS — F17210 Nicotine dependence, cigarettes, uncomplicated: Secondary | ICD-10-CM | POA: Insufficient documentation

## 2021-01-28 DIAGNOSIS — Z1329 Encounter for screening for other suspected endocrine disorder: Secondary | ICD-10-CM

## 2021-01-28 DIAGNOSIS — C7951 Secondary malignant neoplasm of bone: Secondary | ICD-10-CM | POA: Insufficient documentation

## 2021-01-28 DIAGNOSIS — C3411 Malignant neoplasm of upper lobe, right bronchus or lung: Secondary | ICD-10-CM | POA: Diagnosis not present

## 2021-01-28 LAB — COMPREHENSIVE METABOLIC PANEL
ALT: 94 U/L — ABNORMAL HIGH (ref 0–44)
AST: 101 U/L — ABNORMAL HIGH (ref 15–41)
Albumin: 2.8 g/dL — ABNORMAL LOW (ref 3.5–5.0)
Alkaline Phosphatase: 116 U/L (ref 38–126)
Anion gap: 7 (ref 5–15)
BUN: 20 mg/dL (ref 8–23)
CO2: 26 mmol/L (ref 22–32)
Calcium: 8.9 mg/dL (ref 8.9–10.3)
Chloride: 102 mmol/L (ref 98–111)
Creatinine, Ser: 1.2 mg/dL (ref 0.61–1.24)
GFR, Estimated: >60 mL/min (ref >60)
Glucose, Bld: 113 mg/dL — ABNORMAL HIGH (ref 70–99)
Potassium: 3.7 mmol/L (ref 3.5–5.1)
Sodium: 135 mmol/L (ref 135–145)
Total Bilirubin: 1 mg/dL (ref 0.3–1.2)
Total Protein: 7.1 g/dL (ref 6.5–8.1)

## 2021-01-28 LAB — CBC WITH DIFFERENTIAL/PLATELET
Abs Immature Granulocytes: 0.05 10*3/uL (ref 0.00–0.07)
Basophils Absolute: 0.1 10*3/uL (ref 0.0–0.1)
Basophils Relative: 1 %
Eosinophils Absolute: 0.1 10*3/uL (ref 0.0–0.5)
Eosinophils Relative: 2 %
HCT: 31.3 % — ABNORMAL LOW (ref 39.0–52.0)
Hemoglobin: 10.7 g/dL — ABNORMAL LOW (ref 13.0–17.0)
Immature Granulocytes: 1 %
Lymphocytes Relative: 20 %
Lymphs Abs: 1.1 10*3/uL (ref 0.7–4.0)
MCH: 32.5 pg (ref 26.0–34.0)
MCHC: 34.2 g/dL (ref 30.0–36.0)
MCV: 95.1 fL (ref 80.0–100.0)
Monocytes Absolute: 0.6 10*3/uL (ref 0.1–1.0)
Monocytes Relative: 11 %
Neutro Abs: 3.5 10*3/uL (ref 1.7–7.7)
Neutrophils Relative %: 65 %
Platelets: 141 10*3/uL — ABNORMAL LOW (ref 150–400)
RBC: 3.29 MIL/uL — ABNORMAL LOW (ref 4.22–5.81)
RDW: 15.8 % — ABNORMAL HIGH (ref 11.5–15.5)
WBC: 5.3 10*3/uL (ref 4.0–10.5)
nRBC: 0 % (ref 0.0–0.2)

## 2021-01-28 LAB — TSH: TSH: 138 u[IU]/mL — ABNORMAL HIGH (ref 0.350–4.500)

## 2021-01-28 MED ORDER — SODIUM CHLORIDE 0.9% FLUSH
10.0000 mL | Freq: Once | INTRAVENOUS | Status: AC
  Administered 2021-01-28: 10 mL via INTRAVENOUS
  Filled 2021-01-28: qty 10

## 2021-01-28 MED ORDER — HEPARIN SOD (PORK) LOCK FLUSH 100 UNIT/ML IV SOLN
500.0000 [IU] | Freq: Once | INTRAVENOUS | Status: AC | PRN
  Filled 2021-01-28: qty 5

## 2021-01-28 MED ORDER — SODIUM CHLORIDE 0.9 % IV SOLN
Freq: Once | INTRAVENOUS | Status: AC
  Filled 2021-01-28: qty 250

## 2021-01-28 MED ORDER — SODIUM CHLORIDE 0.9 % IV SOLN
200.0000 mg | Freq: Once | INTRAVENOUS | Status: AC
  Administered 2021-01-28: 200 mg via INTRAVENOUS
  Filled 2021-01-28: qty 8

## 2021-01-28 MED ORDER — PROCHLORPERAZINE MALEATE 10 MG PO TABS
10.0000 mg | ORAL_TABLET | Freq: Once | ORAL | Status: AC
  Administered 2021-01-28: 10 mg via ORAL
  Filled 2021-01-28: qty 1

## 2021-01-28 MED ORDER — HEPARIN SOD (PORK) LOCK FLUSH 100 UNIT/ML IV SOLN
INTRAVENOUS | Status: AC
  Administered 2021-01-28: 500 [IU]
  Filled 2021-01-28: qty 5

## 2021-01-28 NOTE — Progress Notes (Signed)
Patient has no concerns today. 

## 2021-01-28 NOTE — Progress Notes (Signed)
Nutrition Assessment   Reason for Assessment:   Patient identified on Malnutrition Screening report for weight loss   ASSESSMENT:  75 year old male with lung cancer with bony metastasis.  Past medical history of larynx cancer in 2018 received radiation and chemotherapy.   Met with patient during infusion.  Patient reports that he eats  "when they fix me something I like."  Lives in group home.  Patient eating peanut butter crackers and potato chips during visit.  Patient denies trouble swallowing or nausea.  Says that he eats oatmeal for breakfast and soup during he day.  Sometimes he eats spaghetti but does not like it.     Medications: reviewed   Labs: reviewed   Anthropometrics:   Weight 111 lb today  116 lb 14.4 oz on 12/03/2020   Estimated Energy Needs  Kcals: 1500-1750 Protein: 75-88 g Fluid: 1.5 L   NUTRITION DIAGNOSIS: Unintentional weight loss related to cancer, not liking meals that are prepared at facility as evidenced by BMI 17    INTERVENTION:  Discussed ways to add calories to foods (ie adding butter, gravies, cheese, peanut butter)    MONITORING, EVALUATION, GOAL: weight trends, intake   Next Visit: Tuesday, Jan 3rd during infusion  [name] B. Zenia Resides, [place], [place] Registered Dietitian [phone] (mobile)

## 2021-01-28 NOTE — Patient Instructions (Signed)
[facility] CANCER CTR AT [place]  Discharge Instructions: Thank you for choosing [facility] to provide your oncology and hematology care.  If you have a lab appointment with the [facility], please go directly to the [facility] and check in at the registration area.  Wear comfortable clothing and clothing appropriate for easy access to any Portacath or PICC line.   We strive to give you quality time with your provider. You may need to reschedule your appointment if you arrive late (15 or more minutes).  Arriving late affects you and other patients whose appointments are after yours.  Also, if you miss three or more appointments without notifying the office, you may be dismissed from the clinic at the providers discretion.      For prescription refill requests, have your pharmacy contact our office and allow 72 hours for refills to be completed.    Today you received the following chemotherapy and/or immunotherapy agents Beryle Flock    To help prevent nausea and vomiting after your treatment, we encourage you to take your nausea medication as directed.  BELOW ARE SYMPTOMS THAT SHOULD BE REPORTED IMMEDIATELY: *FEVER GREATER THAN 100.4 F (38 C) OR HIGHER *CHILLS OR SWEATING *NAUSEA AND VOMITING THAT IS NOT CONTROLLED WITH YOUR NAUSEA MEDICATION *UNUSUAL SHORTNESS OF BREATH *UNUSUAL BRUISING OR BLEEDING *URINARY PROBLEMS (pain or burning when urinating, or frequent urination) *BOWEL PROBLEMS (unusual diarrhea, constipation, pain near the anus) TENDERNESS IN MOUTH AND THROAT WITH OR WITHOUT PRESENCE OF ULCERS (sore throat, sores in mouth, or a toothache) UNUSUAL RASH, SWELLING OR PAIN  UNUSUAL VAGINAL DISCHARGE OR ITCHING   Items with * indicate a potential emergency and should be followed up as soon as possible or go to the Emergency Department if any problems should occur.  Please show the CHEMOTHERAPY ALERT CARD or IMMUNOTHERAPY ALERT CARD at check-in to the  Emergency Department and triage nurse.  Should you have questions after your visit or need to cancel or reschedule your appointment, please contact [facility] CANCER [place] AT [place]  [phone] and follow the prompts.  Office hours are 8:00 a.m. to 4:30 p.m. Monday - Friday. Please note that voicemails left after 4:00 p.m. may not be returned until the following business day.  We are closed weekends and major holidays. You have access to a nurse at all times for urgent questions. Please call the main number to the clinic [phone] and follow the prompts.  For any non-urgent questions, you may also contact your provider using MyChart. We now offer e-Visits for anyone 45 and older to request care online for non-urgent symptoms. For details visit [email or web address].   Also download the MyChart app! Go to the app store, search "MyChart", open the app, select [place], and log in with your MyChart username and password.  Due to Covid, a mask is required upon entering the hospital/clinic. If you do not have a mask, one will be given to you upon arrival. For doctor visits, patients may have 1 support person aged 41 or older with them. For treatment visits, patients cannot have anyone with them due to current Covid guidelines and our immunocompromised population.

## 2021-01-28 NOTE — Progress Notes (Signed)
Labs reviewed with MD and treatment team. Per MD to proceed with treatment.   [name] [employer]

## 2021-01-30 ENCOUNTER — Telehealth: Payer: Self-pay | Admitting: Oncology

## 2021-01-30 NOTE — Telephone Encounter (Signed)
Caregiver called to reschedule pt appt for 02-18-21. Call back at [phone] ask for [name]

## 2021-02-02 ENCOUNTER — Other Ambulatory Visit: Payer: Self-pay

## 2021-02-18 ENCOUNTER — Inpatient Hospital Stay: Payer: Medicare Other

## 2021-02-20 ENCOUNTER — Inpatient Hospital Stay: Payer: Medicare Other | Attending: Oncology

## 2021-02-20 ENCOUNTER — Other Ambulatory Visit: Payer: Self-pay

## 2021-02-20 VITALS — BP 113/69 | HR 76 | Temp 97.2°F | Resp 16 | Wt 113.4 lb

## 2021-02-20 DIAGNOSIS — C7951 Secondary malignant neoplasm of bone: Secondary | ICD-10-CM | POA: Insufficient documentation

## 2021-02-20 DIAGNOSIS — Z5112 Encounter for antineoplastic immunotherapy: Secondary | ICD-10-CM | POA: Diagnosis present

## 2021-02-20 DIAGNOSIS — Z79899 Other long term (current) drug therapy: Secondary | ICD-10-CM | POA: Diagnosis not present

## 2021-02-20 DIAGNOSIS — C3491 Malignant neoplasm of unspecified part of right bronchus or lung: Secondary | ICD-10-CM

## 2021-02-20 DIAGNOSIS — C3411 Malignant neoplasm of upper lobe, right bronchus or lung: Secondary | ICD-10-CM | POA: Insufficient documentation

## 2021-02-20 DIAGNOSIS — Z1329 Encounter for screening for other suspected endocrine disorder: Secondary | ICD-10-CM

## 2021-02-20 LAB — CBC WITH DIFFERENTIAL/PLATELET
Abs Immature Granulocytes: 0.05 10*3/uL (ref 0.00–0.07)
Basophils Absolute: 0 10*3/uL (ref 0.0–0.1)
Basophils Relative: 1 %
Eosinophils Absolute: 0.1 10*3/uL (ref 0.0–0.5)
Eosinophils Relative: 1 %
HCT: 29.2 % — ABNORMAL LOW (ref 39.0–52.0)
Hemoglobin: 9.8 g/dL — ABNORMAL LOW (ref 13.0–17.0)
Immature Granulocytes: 1 %
Lymphocytes Relative: 16 %
Lymphs Abs: 0.8 10*3/uL (ref 0.7–4.0)
MCH: 32.8 pg (ref 26.0–34.0)
MCHC: 33.6 g/dL (ref 30.0–36.0)
MCV: 97.7 fL (ref 80.0–100.0)
Monocytes Absolute: 0.7 10*3/uL (ref 0.1–1.0)
Monocytes Relative: 14 %
Neutro Abs: 3.3 10*3/uL (ref 1.7–7.7)
Neutrophils Relative %: 67 %
Platelets: 126 10*3/uL — ABNORMAL LOW (ref 150–400)
RBC: 2.99 MIL/uL — ABNORMAL LOW (ref 4.22–5.81)
RDW: 15.9 % — ABNORMAL HIGH (ref 11.5–15.5)
WBC: 4.9 10*3/uL (ref 4.0–10.5)
nRBC: 0 % (ref 0.0–0.2)

## 2021-02-20 LAB — COMPREHENSIVE METABOLIC PANEL
ALT: 80 U/L — ABNORMAL HIGH (ref 0–44)
AST: 89 U/L — ABNORMAL HIGH (ref 15–41)
Albumin: 2.5 g/dL — ABNORMAL LOW (ref 3.5–5.0)
Alkaline Phosphatase: 114 U/L (ref 38–126)
Anion gap: 9 (ref 5–15)
BUN: 19 mg/dL (ref 8–23)
CO2: 24 mmol/L (ref 22–32)
Calcium: 9 mg/dL (ref 8.9–10.3)
Chloride: 100 mmol/L (ref 98–111)
Creatinine, Ser: 0.94 mg/dL (ref 0.61–1.24)
GFR, Estimated: >60 mL/min (ref >60)
Glucose, Bld: 123 mg/dL — ABNORMAL HIGH (ref 70–99)
Potassium: 3.8 mmol/L (ref 3.5–5.1)
Sodium: 133 mmol/L — ABNORMAL LOW (ref 135–145)
Total Bilirubin: 0.9 mg/dL (ref 0.3–1.2)
Total Protein: 6.8 g/dL (ref 6.5–8.1)

## 2021-02-20 LAB — TSH: TSH: 4.443 u[IU]/mL (ref 0.350–4.500)

## 2021-02-20 MED ORDER — PROCHLORPERAZINE MALEATE 10 MG PO TABS
10.0000 mg | ORAL_TABLET | Freq: Once | ORAL | Status: AC
  Administered 2021-02-20: 10 mg via ORAL
  Filled 2021-02-20: qty 1

## 2021-02-20 MED ORDER — HEPARIN SOD (PORK) LOCK FLUSH 100 UNIT/ML IV SOLN
500.0000 [IU] | Freq: Once | INTRAVENOUS | Status: AC | PRN
  Administered 2021-02-20: 500 [IU]
  Filled 2021-02-20: qty 5

## 2021-02-20 MED ORDER — SODIUM CHLORIDE 0.9 % IV SOLN
200.0000 mg | Freq: Once | INTRAVENOUS | Status: AC
  Administered 2021-02-20: 200 mg via INTRAVENOUS
  Filled 2021-02-20: qty 8

## 2021-02-20 MED ORDER — SODIUM CHLORIDE 0.9 % IV SOLN
Freq: Once | INTRAVENOUS | Status: AC
  Filled 2021-02-20: qty 250

## 2021-02-20 MED ORDER — SODIUM CHLORIDE 0.9% FLUSH
10.0000 mL | INTRAVENOUS | Status: DC | PRN
  Filled 2021-02-20: qty 10

## 2021-02-20 MED ORDER — HEPARIN SOD (PORK) LOCK FLUSH 100 UNIT/ML IV SOLN
INTRAVENOUS | Status: AC
  Filled 2021-02-20: qty 5

## 2021-02-20 NOTE — Patient Instructions (Signed)
[facility] CANCER CTR AT [place]  Discharge Instructions: Thank you for choosing [facility] to provide your oncology and hematology care.  If you have a lab appointment with the [facility], please go directly to the [place] and check in at the registration area.  Wear comfortable clothing and clothing appropriate for easy access to any Portacath or PICC line.   We strive to give you quality time with your provider. You may need to reschedule your appointment if you arrive late (15 or more minutes).  Arriving late affects you and other patients whose appointments are after yours.  Also, if you miss three or more appointments without notifying the office, you may be dismissed from the clinic at the providers discretion.      For prescription refill requests, have your pharmacy contact our office and allow 72 hours for refills to be completed.    Today you received the following chemotherapy and/or immunotherapy agents - pembrolizumab      To help prevent nausea and vomiting after your treatment, we encourage you to take your nausea medication as directed.  BELOW ARE SYMPTOMS THAT SHOULD BE REPORTED IMMEDIATELY: *FEVER GREATER THAN 100.4 F (38 C) OR HIGHER *CHILLS OR SWEATING *NAUSEA AND VOMITING THAT IS NOT CONTROLLED WITH YOUR NAUSEA MEDICATION *UNUSUAL SHORTNESS OF BREATH *UNUSUAL BRUISING OR BLEEDING *URINARY PROBLEMS (pain or burning when urinating, or frequent urination) *BOWEL PROBLEMS (unusual diarrhea, constipation, pain near the anus) TENDERNESS IN MOUTH AND THROAT WITH OR WITHOUT PRESENCE OF ULCERS (sore throat, sores in mouth, or a toothache) UNUSUAL RASH, SWELLING OR PAIN  UNUSUAL VAGINAL DISCHARGE OR ITCHING   Items with * indicate a potential emergency and should be followed up as soon as possible or go to the Emergency Department if any problems should occur.  Please show the CHEMOTHERAPY ALERT CARD or IMMUNOTHERAPY ALERT CARD at  check-in to the Emergency Department and triage nurse.  Should you have questions after your visit or need to cancel or reschedule your appointment, please contact [facility] CANCER [place] AT [place]  [phone] and follow the prompts.  Office hours are 8:00 a.m. to 4:30 p.m. Monday - Friday. Please note that voicemails left after 4:00 p.m. may not be returned until the following business day.  We are closed weekends and major holidays. You have access to a nurse at all times for urgent questions. Please call the main number to the clinic [phone] and follow the prompts.  For any non-urgent questions, you may also contact your provider using MyChart. We now offer e-Visits for anyone 40 and older to request care online for non-urgent symptoms. For details visit [email or web address].   Also download the MyChart app! Go to the app store, search "MyChart", open the app, select [place], and log in with your MyChart username and password.  Due to Covid, a mask is required upon entering the hospital/clinic. If you do not have a mask, one will be given to you upon arrival. For doctor visits, patients may have 1 support person aged 48 or older with them. For treatment visits, patients cannot have anyone with them due to current Covid guidelines and our immunocompromised population.   Pembrolizumab injection What is this medication? PEMBROLIZUMAB (pem broe liz ue mab) is a monoclonal antibody. It is used to treat certain types of cancer. This medicine may be used for other purposes; ask your health care provider or pharmacist if you have questions. COMMON BRAND NAME(S): Keytruda What should I tell my care  team before I take this medication? They need to know if you have any of these conditions: autoimmune diseases like Crohn's disease, ulcerative colitis, or lupus have had or planning to have an allogeneic stem cell transplant (uses someone else's stem cells) history of organ  transplant history of chest radiation nervous system problems like myasthenia gravis or Guillain-Barre syndrome an unusual or allergic reaction to pembrolizumab, other medicines, foods, dyes, or preservatives pregnant or trying to get pregnant breast-feeding How should I use this medication? This medicine is for infusion into a vein. It is given by a health care professional in a hospital or clinic setting. A special MedGuide will be given to you before each treatment. Be sure to read this information carefully each time. Talk to your pediatrician regarding the use of this medicine in children. While this drug may be prescribed for children as young as 6 months for selected conditions, precautions do apply. Overdosage: If you think you have taken too much of this medicine contact a poison control center or emergency room at once. NOTE: This medicine is only for you. Do not share this medicine with others. What if I miss a dose? It is important not to miss your dose. Call your doctor or health care professional if you are unable to keep an appointment. What may interact with this medication? Interactions have not been studied. This list may not describe all possible interactions. Give your health care provider a list of all the medicines, herbs, non-prescription drugs, or dietary supplements you use. Also tell them if you smoke, drink alcohol, or use illegal drugs. Some items may interact with your medicine. What should I watch for while using this medication? Your condition will be monitored carefully while you are receiving this medicine. You may need blood work done while you are taking this medicine. Do not become pregnant while taking this medicine or for 4 months after stopping it. Women should inform their doctor if they wish to become pregnant or think they might be pregnant. There is a potential for serious side effects to an unborn child. Talk to your health care professional or  pharmacist for more information. Do not breast-feed an infant while taking this medicine or for 4 months after the last dose. What side effects may I notice from receiving this medication? Side effects that you should report to your doctor or health care professional as soon as possible: allergic reactions like skin rash, itching or hives, swelling of the face, lips, or tongue bloody or black, tarry breathing problems changes in vision chest pain chills confusion constipation cough diarrhea dizziness or feeling faint or lightheaded fast or irregular heartbeat fever flushing joint pain low blood counts - this medicine may decrease the number of white blood cells, red blood cells and platelets. You may be at increased risk for infections and bleeding. muscle pain muscle weakness pain, tingling, numbness in the hands or feet persistent headache redness, blistering, peeling or loosening of the skin, including inside the mouth signs and symptoms of high blood sugar such as dizziness; dry mouth; dry skin; fruity breath; nausea; stomach pain; increased hunger or thirst; increased urination signs and symptoms of kidney injury like trouble passing urine or change in the amount of urine signs and symptoms of liver injury like dark urine, light-colored stools, loss of appetite, nausea, right upper belly pain, yellowing of the eyes or skin sweating swollen lymph nodes weight loss Side effects that usually do not require medical attention (report to your doctor  or health care professional if they continue or are bothersome): decreased appetite hair loss tiredness This list may not describe all possible side effects. Call your doctor for medical advice about side effects. You may report side effects to FDA at 1-800-FDA-1088. Where should I keep my medication? This drug is given in a hospital or clinic and will not be stored at home. NOTE: This sheet is a summary. It may not cover all possible  information. If you have questions about this medicine, talk to your doctor, pharmacist, or health care provider.  2022 Elsevier/Gold Standard (2020-10-22 00:00:00)

## 2021-02-20 NOTE — Progress Notes (Signed)
AST 89/ALT 80. OK for Keytruda today per Dr. Grayland Ormond.

## 2021-03-06 NOTE — Progress Notes (Signed)
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  PDL-1 10%, high TMB. ° °INTERVAL HISTORY: Patient returns to clinic today for further evaluation and consideration of cycle 5 of single agent Keytruda.  He complains of insomnia, but otherwise feels well.  He continues to tolerate his treatments without significant side effects. He has no neurologic complaints.  He denies any pain.  He has a good appetite and denies weight loss.  He has no chest pain, shortness of breath, cough, or hemoptysis.  He denies any nausea, vomiting, constipation, or diarrhea.  He has no urinary complaints.  Patient offers no further specific complaints today. ° °REVIEW OF SYSTEMS:    °Review of Systems  °Constitutional: Negative.  Negative for fever, malaise/fatigue and weight loss.  °HENT: Negative.  Negative for sore throat.   °Respiratory: Negative.  Negative for cough and shortness of breath.   °Cardiovascular: Negative.  Negative for chest pain and leg swelling.  °Gastrointestinal: Negative.  Negative for abdominal pain.  °Genitourinary: Negative.  Negative for dysuria.  °Musculoskeletal: Negative.  Negative for back pain.  °Skin: Negative.  Negative for rash.  °Neurological: Negative.  Negative for dizziness, sensory change, focal weakness, weakness and headaches.  °Psychiatric/Behavioral:  Negative for substance abuse. The patient has insomnia.   ° °As per HPI. Otherwise, a complete review of systems is negative. ° °PAST MEDICAL HISTORY: °Past Medical History:  °Diagnosis Date  ° Alcohol abuse   ° Cirrhosis (HCC)   ° COPD (chronic obstructive pulmonary  disease) (HCC)   ° Dementia (HCC)   ° Hepatitis C   ° Malnutrition (HCC)   ° Throat cancer (HCC)   ° Lung Cancer (presently)  History of throat cancer  ° ° °PAST SURGICAL HISTORY: °Past Surgical History:  °Procedure Laterality Date  ° ESOPHAGOGASTRODUODENOSCOPY (EGD) WITH PROPOFOL N/A 08/03/2019  ° Procedure: ESOPHAGOGASTRODUODENOSCOPY (EGD) WITH PROPOFOL;  Surgeon: Vanga, Rohini Reddy, MD;  Location: [facility] ENDOSCOPY;  Service: Gastroenterology;  Laterality: N/A;  ° IR IMAGING GUIDED PORT INSERTION  04/28/2019  ° TONSILLECTOMY    ° age was 14  ° VASECTOMY    ° ° °FAMILY HISTORY: °Family History  °Problem Relation Age of Onset  ° Breast cancer Mother   ° ° °ADVANCED DIRECTIVES (Y/N):  N ° °HEALTH MAINTENANCE: °Social History  ° °Tobacco Use  ° Smoking status: Every Day  °  Packs/day: 0.50  °  Years: 63.00  °  Pack years: 31.50  °  Types: Cigarettes  ° Smokeless tobacco: Never  °Vaping Use  ° Vaping Use: Never used  °Substance Use Topics  ° Alcohol use: Not Currently  °  Alcohol/week: 1.0 standard drink  °  Types: 1 Cans of beer per week  ° Drug use: No  ° ° ° Colonoscopy: ° PAP: ° Bone density: ° Lipid panel: ° °Allergies  °Allergen Reactions  ° Penicillins Other (See Comments)  °  Reaction: unknown °Patient is not able to answer follow up questions  ° ° °Current Outpatient Medications  °Medication Sig Dispense Refill  ° ADLARITY 5 MG/DAY PTWK Place onto the skin.    ° buPROPion (WELLBUTRIN SR) 150 MG 12 hr tablet bupropion HCl SR 150 mg   tablet,12 hr sustained-release    ° docusate (COLACE) 50 MG/5ML liquid Take 100 mg by mouth daily.    ° ferrous sulfate 325 (65 FE) MG tablet Take 325 mg by mouth daily with breakfast.    ° fluticasone (FLONASE) 50 MCG/ACT nasal spray Place into both nostrils.    ° folic acid (FOLVITE) 1 MG tablet Take 1 mg by mouth daily.    ° hydrOXYzine (ATARAX/VISTARIL) 25 MG tablet Take 50 mg by mouth at bedtime.    ° hydrOXYzine (ATARAX/VISTARIL) 50 MG tablet Take 50 mg by mouth 2 (two) times  daily.    ° lactose free nutrition (BOOST PLUS) LIQD Take 237 mLs by mouth 4 (four) times daily.    ° levothyroxine (SYNTHROID) 100 MCG tablet Take 100 mcg by mouth daily before breakfast.    ° lidocaine-prilocaine (EMLA) cream Apply 1 application topically as needed. Apply to port and cover with saran wrap 1-2 hours prior to port access 30 g 1  ° mirtazapine (REMERON) 15 MG tablet Take 15 mg by mouth at bedtime.    ° mupirocin ointment (BACTROBAN) 2 % Apply 1 application topically 2 (two) times daily.    ° ondansetron (ZOFRAN) 8 MG tablet Take by mouth every 8 (eight) hours as needed for nausea or vomiting.    ° polyethylene glycol (MIRALAX / GLYCOLAX) 17 g packet Take 17 g by mouth daily.    ° prochlorperazine (COMPAZINE) 10 MG tablet Take 1 tablet (10 mg total) by mouth every 6 (six) hours as needed (Nausea or vomiting). 60 tablet 2  ° QUEtiapine (SEROQUEL) 25 MG tablet Take 25 mg by mouth 3 (three) times daily.    ° QUEtiapine (SEROQUEL) 50 MG tablet Take 1 tablet (50 mg total) by mouth at bedtime. (Patient taking differently: Take 50 mg by mouth at bedtime. Takes with a 25 mg tablet) 30 tablet 0  ° spironolactone (ALDACTONE) 50 MG tablet TAKE 1 TABLET BY MOUTH EVERY DAY 90 tablet 1  ° tamsulosin (FLOMAX) 0.4 MG CAPS capsule Take 0.8 mg by mouth daily after breakfast.     ° thiamine 100 MG tablet Take 100 mg by mouth daily.    ° TIROSINT-SOL 150 MCG/ML SOLN Take by mouth.    ° TRELEGY ELLIPTA 100-62.5-25 MCG/INH AEPB Inhale 1 puff into the lungs in the morning.     ° vitamin C (ASCORBIC ACID) 250 MG tablet Take 500 mg by mouth daily.     ° Vitamin D, Ergocalciferol, (DRISDOL) 1.25 MG (50000 UNIT) CAPS capsule Take 50,000 Units by mouth every 7 (seven) days.    ° Cholecalciferol (VITAMIN D3) 5000 units TABS Take 5,000 Units by mouth daily. (Patient not taking: Reported on 11/19/2020)    ° furosemide (LASIX) 40 MG tablet Take 1 tablet (40 mg total) by mouth daily. 90 tablet 1  ° °No current facility-administered  medications for this visit.  ° °Facility-Administered Medications Ordered in Other Visits  °Medication Dose Route Frequency Provider Last Rate Last Admin  ° 0.9 %  sodium chloride infusion   Intravenous Once ,  J, MD      ° heparin lock flush 100 UNIT/ML injection           ° heparin lock flush 100 unit/mL  500 Units Intravenous Once ,  J, MD      ° heparin lock flush 100 unit/mL  500 Units Intracatheter Once PRN ,  J, MD      ° pembrolizumab (KEYTRUDA) 200 mg in sodium chloride 0.9 %   0.9 % 50 mL chemo infusion  200 mg Intravenous Once Lloyd Huger, MD        OBJECTIVE: Vitals:   03/11/21 1052  BP: (!) 70/53  Pulse: 75  Temp: 98.6 F (37 C)     Body mass index is 18.21 kg/m.    ECOG FS:0 - Asymptomatic  General: Thin, no acute distress. Eyes: Pink conjunctiva, anicteric sclera. HEENT: Normocephalic, moist mucous membranes. Lungs: No audible wheezing or coughing. Heart: Regular rate and rhythm. Abdomen: Soft, nontender, no obvious distention. Musculoskeletal: No edema, cyanosis, or clubbing. Neuro: Alert, answering all questions appropriately. Cranial nerves grossly intact. Skin: No rashes or petechiae noted. Psych: Normal affect.   LAB RESULTS:  Lab Results  Component Value Date   NA 134 (L) 03/11/2021   K 3.3 (L) 03/11/2021   CL 99 03/11/2021   CO2 26 03/11/2021   GLUCOSE 130 (H) 03/11/2021   BUN 17 03/11/2021   CREATININE 1.07 03/11/2021   CALCIUM 8.9 03/11/2021   PROT 6.9 03/11/2021   ALBUMIN 2.7 (L) 03/11/2021   AST 71 (H) 03/11/2021   ALT 65 (H) 03/11/2021   ALKPHOS 97 03/11/2021   BILITOT 1.1 03/11/2021   GFRNONAA >60 03/11/2021   GFRAA 74 04/09/2020    Lab Results  Component Value Date   WBC 5.1 03/11/2021   NEUTROABS 3.3 03/11/2021   HGB 10.0 (L) 03/11/2021   HCT 29.4 (L) 03/11/2021   MCV 97.4 03/11/2021   PLT 127 (L) 03/11/2021     STUDIES: No results found.   ASSESSMENT: Stage IVb adenocarcinoma of  the right upper lobe lung with bony metastasis. PD-L1 10%, with high tumor mutational burden.  PLAN:    1.  Stage IVb adenocarcinoma of the right upper lobe lung: PET scan results from November 26, 2020 reviewed independently and reported as above with likely progressive disease.  Areas of improvement are likely residual from previous when patient last received treatment with carboplatinum, pemetrexed, and Keytruda on Jun 22, 2019.  He only received 3 cycles of treatment and then subsequently was lost to follow-up.  After lengthy discussion with patient and his caretaker, he has agreed to pursue single agent Keytruda every 3 weeks.  Proceed with cycle 4 of treatment today.  Return to clinic in 3 weeks for treatment only and then in 6 weeks for further evaluation and consideration of cycle 7.  Plan to repeat PET scan in approximately April 2023.   2. 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  Patient received his last dose of weekly cisplatin on May 04, 2016.  PET scan as above. 3.  Social situation: Patient has a legal guardian. 4.  Anemia: Chronic and unchanged.  Patient's hemoglobin is 10.0 today. 5.  Hypokalemia: Mild, monitor. 6.  Transaminitis: Chronic and unchanged.  Unclear etiology.  Proceed with treatment as above. 7.  Hypotension: Proceed with 1 L of IV fluids and additional treatment today.   Patient expressed understanding and was in agreement with this plan. He also understands that He can call clinic at any time with any questions, concerns, or complaints.     Cancer Staging  Adenocarcinoma, lung, right [facility]) Staging form: Lung, AJCC 8th Edition - Clinical stage from 05/02/2019: Stage IVB (cT1c, cN2, cM1c) - Signed by Lloyd Huger, MD on 05/02/2019  Primary cancer of larynx [facility]) Staging form: Larynx - Supraglottis, AJCC 8th Edition - Clinical stage from 02/27/2016: Stage IVA (cT1, cN2b, cM0) - Signed   J, MD on 02/27/2016 °Laterality: Right ° ° ° J , MD 03/11/21 11:28 AM  ° ° ° ° °

## 2021-03-11 ENCOUNTER — Inpatient Hospital Stay: Payer: Medicare Other

## 2021-03-11 ENCOUNTER — Telehealth: Payer: Self-pay

## 2021-03-11 ENCOUNTER — Inpatient Hospital Stay (HOSPITAL_BASED_OUTPATIENT_CLINIC_OR_DEPARTMENT_OTHER): Payer: Medicare Other | Admitting: Oncology

## 2021-03-11 ENCOUNTER — Encounter: Payer: Self-pay | Admitting: Oncology

## 2021-03-11 ENCOUNTER — Other Ambulatory Visit: Payer: Self-pay

## 2021-03-11 VITALS — BP 129/57 | HR 77

## 2021-03-11 VITALS — BP 70/53 | HR 75 | Temp 98.6°F | Wt 112.8 lb

## 2021-03-11 DIAGNOSIS — Z5112 Encounter for antineoplastic immunotherapy: Secondary | ICD-10-CM | POA: Diagnosis not present

## 2021-03-11 DIAGNOSIS — C3491 Malignant neoplasm of unspecified part of right bronchus or lung: Secondary | ICD-10-CM | POA: Diagnosis not present

## 2021-03-11 DIAGNOSIS — Z1329 Encounter for screening for other suspected endocrine disorder: Secondary | ICD-10-CM

## 2021-03-11 LAB — CBC WITH DIFFERENTIAL/PLATELET
Abs Immature Granulocytes: 0.02 10*3/uL (ref 0.00–0.07)
Basophils Absolute: 0 10*3/uL (ref 0.0–0.1)
Basophils Relative: 1 %
Eosinophils Absolute: 0.1 10*3/uL (ref 0.0–0.5)
Eosinophils Relative: 2 %
HCT: 29.4 % — ABNORMAL LOW (ref 39.0–52.0)
Hemoglobin: 10 g/dL — ABNORMAL LOW (ref 13.0–17.0)
Immature Granulocytes: 0 %
Lymphocytes Relative: 19 %
Lymphs Abs: 1 10*3/uL (ref 0.7–4.0)
MCH: 33.1 pg (ref 26.0–34.0)
MCHC: 34 g/dL (ref 30.0–36.0)
MCV: 97.4 fL (ref 80.0–100.0)
Monocytes Absolute: 0.7 10*3/uL (ref 0.1–1.0)
Monocytes Relative: 14 %
Neutro Abs: 3.3 10*3/uL (ref 1.7–7.7)
Neutrophils Relative %: 64 %
Platelets: 127 10*3/uL — ABNORMAL LOW (ref 150–400)
RBC: 3.02 MIL/uL — ABNORMAL LOW (ref 4.22–5.81)
RDW: 14.5 % (ref 11.5–15.5)
WBC: 5.1 10*3/uL (ref 4.0–10.5)
nRBC: 0 % (ref 0.0–0.2)

## 2021-03-11 LAB — COMPREHENSIVE METABOLIC PANEL
ALT: 65 U/L — ABNORMAL HIGH (ref 0–44)
AST: 71 U/L — ABNORMAL HIGH (ref 15–41)
Albumin: 2.7 g/dL — ABNORMAL LOW (ref 3.5–5.0)
Alkaline Phosphatase: 97 U/L (ref 38–126)
Anion gap: 9 (ref 5–15)
BUN: 17 mg/dL (ref 8–23)
CO2: 26 mmol/L (ref 22–32)
Calcium: 8.9 mg/dL (ref 8.9–10.3)
Chloride: 99 mmol/L (ref 98–111)
Creatinine, Ser: 1.07 mg/dL (ref 0.61–1.24)
GFR, Estimated: >60 mL/min (ref >60)
Glucose, Bld: 130 mg/dL — ABNORMAL HIGH (ref 70–99)
Potassium: 3.3 mmol/L — ABNORMAL LOW (ref 3.5–5.1)
Sodium: 134 mmol/L — ABNORMAL LOW (ref 135–145)
Total Bilirubin: 1.1 mg/dL (ref 0.3–1.2)
Total Protein: 6.9 g/dL (ref 6.5–8.1)

## 2021-03-11 LAB — TSH: TSH: 8.419 u[IU]/mL — ABNORMAL HIGH (ref 0.350–4.500)

## 2021-03-11 MED ORDER — HEPARIN SOD (PORK) LOCK FLUSH 100 UNIT/ML IV SOLN
INTRAVENOUS | Status: AC
  Filled 2021-03-11: qty 5

## 2021-03-11 MED ORDER — HEPARIN SOD (PORK) LOCK FLUSH 100 UNIT/ML IV SOLN
500.0000 [IU] | Freq: Once | INTRAVENOUS | Status: AC | PRN
  Administered 2021-03-11: 12:00:00 500 [IU]
  Filled 2021-03-11: qty 5

## 2021-03-11 MED ORDER — SODIUM CHLORIDE 0.9 % IV SOLN
Freq: Once | INTRAVENOUS | Status: DC
  Filled 2021-03-11: qty 250

## 2021-03-11 MED ORDER — PROCHLORPERAZINE MALEATE 10 MG PO TABS
10.0000 mg | ORAL_TABLET | Freq: Once | ORAL | Status: AC
  Administered 2021-03-11: 11:00:00 10 mg via ORAL
  Filled 2021-03-11: qty 1

## 2021-03-11 MED ORDER — SODIUM CHLORIDE 0.9 % IV SOLN
Freq: Once | INTRAVENOUS | Status: AC
  Filled 2021-03-11: qty 250

## 2021-03-11 MED ORDER — SODIUM CHLORIDE 0.9 % IV SOLN
200.0000 mg | Freq: Once | INTRAVENOUS | Status: AC
  Administered 2021-03-11: 12:00:00 200 mg via INTRAVENOUS
  Filled 2021-03-11: qty 200

## 2021-03-11 NOTE — Telephone Encounter (Signed)
Nutrition  Received call back from [place], [place] for patient.  She provided RD with contact number for Mr Madelynn Done owner of [employer] that patient lives [phone] to contact to help answer questions.   RD called and spoke with Mr Madelynn Done.  He reports that patient has an appetite but eating smaller volume and takes a long time to eat.  Group Home cuts food in small pieces. 3 meals are prepared for patient and snack are available.  Patient drinks carnation breakfast essentials mixed with 2% milk 2 times per day with medications.  Mr Madelynn Done reports one report of nausea but overall does not complain of this on regular basis.    Intervention: Recommend switch to whole milk for mixing carnation breakfast essentials for additional calories.  Discussed ways to add calories in diet (ie gravies, butter, sauces, dips).  Mr Madelynn Done verbalized understanding and said that he would meet with his team.   Contact information given to Mr Madelynn Done.  Will follow-up as planned.  Shamari Trostel B. Zenia Resides, [place], [place] Registered Dietitian [phone] (mobile)

## 2021-03-11 NOTE — Patient Instructions (Signed)
[facility] CANCER CTR AT [place]  Discharge Instructions: Thank you for choosing [facility] to provide your oncology and hematology care.  If you have a lab appointment with the [facility], please go directly to the [facility] and check in at the registration area.  Wear comfortable clothing and clothing appropriate for easy access to any Portacath or PICC line.   We strive to give you quality time with your provider. You may need to reschedule your appointment if you arrive late (15 or more minutes).  Arriving late affects you and other patients whose appointments are after yours.  Also, if you miss three or more appointments without notifying the office, you may be dismissed from the clinic at the providers discretion.      For prescription refill requests, have your pharmacy contact our office and allow 72 hours for refills to be completed.    Today you received the following chemotherapy and/or immunotherapy agents: Keytruda      To help prevent nausea and vomiting after your treatment, we encourage you to take your nausea medication as directed.  BELOW ARE SYMPTOMS THAT SHOULD BE REPORTED IMMEDIATELY: *FEVER GREATER THAN 100.4 F (38 C) OR HIGHER *CHILLS OR SWEATING *NAUSEA AND VOMITING THAT IS NOT CONTROLLED WITH YOUR NAUSEA MEDICATION *UNUSUAL SHORTNESS OF BREATH *UNUSUAL BRUISING OR BLEEDING *URINARY PROBLEMS (pain or burning when urinating, or frequent urination) *BOWEL PROBLEMS (unusual diarrhea, constipation, pain near the anus) TENDERNESS IN MOUTH AND THROAT WITH OR WITHOUT PRESENCE OF ULCERS (sore throat, sores in mouth, or a toothache) UNUSUAL RASH, SWELLING OR PAIN  UNUSUAL VAGINAL DISCHARGE OR ITCHING   Items with * indicate a potential emergency and should be followed up as soon as possible or go to the Emergency Department if any problems should occur.  Please show the CHEMOTHERAPY ALERT CARD or IMMUNOTHERAPY ALERT CARD at check-in to  the Emergency Department and triage nurse.  Should you have questions after your visit or need to cancel or reschedule your appointment, please contact [facility] CANCER [place] AT [place]  [phone] and follow the prompts.  Office hours are 8:00 a.m. to 4:30 p.m. Monday - Friday. Please note that voicemails left after 4:00 p.m. may not be returned until the following business day.  We are closed weekends and major holidays. You have access to a nurse at all times for urgent questions. Please call the main number to the clinic [phone] and follow the prompts.  For any non-urgent questions, you may also contact your provider using MyChart. We now offer e-Visits for anyone 29 and older to request care online for non-urgent symptoms. For details visit [email or web address].   Also download the MyChart app! Go to the app store, search "MyChart", open the app, select [facility], and log in with your MyChart username and password.  Due to Covid, a mask is required upon entering the hospital/clinic. If you do not have a mask, one will be given to you upon arrival. For doctor visits, patients may have 1 support person aged 46 or older with them. For treatment visits, patients cannot have anyone with them due to current Covid guidelines and our immunocompromised population. Pembrolizumab injection What is this medication? PEMBROLIZUMAB (pem broe liz ue mab) is a monoclonal antibody. It is used to treat certain types of cancer. This medicine may be used for other purposes; ask your health care provider or pharmacist if you have questions. COMMON BRAND NAME(S): Keytruda What should I tell my care team before I  take this medication? They need to know if you have any of these conditions: autoimmune diseases like Crohn's disease, ulcerative colitis, or lupus have had or planning to have an allogeneic stem cell transplant (uses someone else's stem cells) history of organ transplant history of  chest radiation nervous system problems like myasthenia gravis or Guillain-Barre syndrome an unusual or allergic reaction to pembrolizumab, other medicines, foods, dyes, or preservatives pregnant or trying to get pregnant breast-feeding How should I use this medication? This medicine is for infusion into a vein. It is given by a health care professional in a hospital or clinic setting. A special MedGuide will be given to you before each treatment. Be sure to read this information carefully each time. Talk to your pediatrician regarding the use of this medicine in children. While this drug may be prescribed for children as young as 6 months for selected conditions, precautions do apply. Overdosage: If you think you have taken too much of this medicine contact a poison control center or emergency room at once. NOTE: This medicine is only for you. Do not share this medicine with others. What if I miss a dose? It is important not to miss your dose. Call your doctor or health care professional if you are unable to keep an appointment. What may interact with this medication? Interactions have not been studied. This list may not describe all possible interactions. Give your health care provider a list of all the medicines, herbs, non-prescription drugs, or dietary supplements you use. Also tell them if you smoke, drink alcohol, or use illegal drugs. Some items may interact with your medicine. What should I watch for while using this medication? Your condition will be monitored carefully while you are receiving this medicine. You may need blood work done while you are taking this medicine. Do not become pregnant while taking this medicine or for 4 months after stopping it. Women should inform their doctor if they wish to become pregnant or think they might be pregnant. There is a potential for serious side effects to an unborn child. Talk to your health care professional or pharmacist for more  information. Do not breast-feed an infant while taking this medicine or for 4 months after the last dose. What side effects may I notice from receiving this medication? Side effects that you should report to your doctor or health care professional as soon as possible: allergic reactions like skin rash, itching or hives, swelling of the face, lips, or tongue bloody or black, tarry breathing problems changes in vision chest pain chills confusion constipation cough diarrhea dizziness or feeling faint or lightheaded fast or irregular heartbeat fever flushing joint pain low blood counts - this medicine may decrease the number of white blood cells, red blood cells and platelets. You may be at increased risk for infections and bleeding. muscle pain muscle weakness pain, tingling, numbness in the hands or feet persistent headache redness, blistering, peeling or loosening of the skin, including inside the mouth signs and symptoms of high blood sugar such as dizziness; dry mouth; dry skin; fruity breath; nausea; stomach pain; increased hunger or thirst; increased urination signs and symptoms of kidney injury like trouble passing urine or change in the amount of urine signs and symptoms of liver injury like dark urine, light-colored stools, loss of appetite, nausea, right upper belly pain, yellowing of the eyes or skin sweating swollen lymph nodes weight loss Side effects that usually do not require medical attention (report to your doctor or health care  professional if they continue or are bothersome): decreased appetite hair loss tiredness This list may not describe all possible side effects. Call your doctor for medical advice about side effects. You may report side effects to FDA at 1-800-FDA-1088. Where should I keep my medication? This drug is given in a hospital or clinic and will not be stored at home. NOTE: This sheet is a summary. It may not cover all possible information. If you  have questions about this medicine, talk to your doctor, pharmacist, or health care provider.  2022 Elsevier/Gold Standard (2020-10-22 00:00:00)

## 2021-03-11 NOTE — Progress Notes (Signed)
BP 70/53. Per Dr Grayland Ormond, pt to receive 1L NS bolus in addition to [facility]

## 2021-03-11 NOTE — Progress Notes (Signed)
Pt is having trouble sleeping and would like something to help him sleep at night.  Pt claims that "god told him that when he is 89 earth would be hit with fire bolts and he is scared and hopes it passes over him."

## 2021-03-11 NOTE — Progress Notes (Signed)
Nutrition Follow-up:    Patient with lung cancer with bony metastasis.  History of larynx cancer in 2018.    Met with patient during infusion.  Patient eating peanut butter nabs during visit (2 packets).  Says that he has a good appetite.  Does not like spaghetti at group home.  Continues to say he eats the soups.  Says that he is not able to keep snack in room.  Says that he is not able to receive snacks or extra portions of food at the group home.  Wants to know if soybeans are a good source of protein.      Medications: reviewed  Labs: reviewed  Anthropometrics:   Weight 112 lb 12.8 oz today  111 lb on 12/13 116 lb 14.4 oz on 12/03/20  135 lb 08/03/2019   NUTRITION DIAGNOSIS: Unintentional weight loss improved slightly   INTERVENTION:  Called [name], legal guardian. Left message with call back number. Encouraged patient to continue to eat as much as he can at facility to keep weight up.      MONITORING, EVALUATION, GOAL: weight trends, intake   NEXT VISIT: Tuesday, March 7 during infusion  Pamelyn Bancroft B. Zenia Resides, [place], [place] Registered Dietitian [phone] (mobile)

## 2021-03-12 ENCOUNTER — Telehealth: Payer: Self-pay

## 2021-03-12 NOTE — Telephone Encounter (Signed)
Nutrition  Phone called received from [facility] requesting diet recommendations discussed yesterday be faxed to group home at [phone].  Faxed and confirmation received.  Leanora Murin B. Zenia Resides, [place], [place] Registered Dietitian [phone] (mobile)

## 2021-04-01 ENCOUNTER — Inpatient Hospital Stay: Payer: Medicare Other | Attending: Oncology

## 2021-04-01 ENCOUNTER — Other Ambulatory Visit: Payer: Self-pay

## 2021-04-01 VITALS — BP 127/66 | HR 92 | Temp 96.8°F | Resp 18 | Wt 112.5 lb

## 2021-04-01 DIAGNOSIS — C3411 Malignant neoplasm of upper lobe, right bronchus or lung: Secondary | ICD-10-CM | POA: Insufficient documentation

## 2021-04-01 DIAGNOSIS — Z79899 Other long term (current) drug therapy: Secondary | ICD-10-CM | POA: Diagnosis not present

## 2021-04-01 DIAGNOSIS — Z5112 Encounter for antineoplastic immunotherapy: Secondary | ICD-10-CM | POA: Diagnosis present

## 2021-04-01 DIAGNOSIS — C7951 Secondary malignant neoplasm of bone: Secondary | ICD-10-CM | POA: Insufficient documentation

## 2021-04-01 DIAGNOSIS — I959 Hypotension, unspecified: Secondary | ICD-10-CM | POA: Insufficient documentation

## 2021-04-01 DIAGNOSIS — F17210 Nicotine dependence, cigarettes, uncomplicated: Secondary | ICD-10-CM | POA: Insufficient documentation

## 2021-04-01 DIAGNOSIS — C3491 Malignant neoplasm of unspecified part of right bronchus or lung: Secondary | ICD-10-CM

## 2021-04-01 MED ORDER — SODIUM CHLORIDE 0.9 % IV SOLN
200.0000 mg | Freq: Once | INTRAVENOUS | Status: AC
  Administered 2021-04-01: 200 mg via INTRAVENOUS
  Filled 2021-04-01: qty 200

## 2021-04-01 MED ORDER — HEPARIN SOD (PORK) LOCK FLUSH 100 UNIT/ML IV SOLN
INTRAVENOUS | Status: AC
  Filled 2021-04-01: qty 5

## 2021-04-01 MED ORDER — SODIUM CHLORIDE 0.9 % IV SOLN
Freq: Once | INTRAVENOUS | Status: AC
  Filled 2021-04-01: qty 250

## 2021-04-01 MED ORDER — PROCHLORPERAZINE MALEATE 10 MG PO TABS
10.0000 mg | ORAL_TABLET | Freq: Once | ORAL | Status: AC
  Administered 2021-04-01: 10 mg via ORAL
  Filled 2021-04-01: qty 1

## 2021-04-01 NOTE — Patient Instructions (Signed)
[facility] CANCER CTR AT [place]  Discharge Instructions: Thank you for choosing [name] to provide your oncology and hematology care.  If you have a lab appointment with the [facility], please go directly to the [facility] and check in at the registration area.  Wear comfortable clothing and clothing appropriate for easy access to any Portacath or PICC line.   We strive to give you quality time with your provider. You may need to reschedule your appointment if you arrive late (15 or more minutes).  Arriving late affects you and other patients whose appointments are after yours.  Also, if you miss three or more appointments without notifying the office, you may be dismissed from the clinic at the providers discretion.      For prescription refill requests, have your pharmacy contact our office and allow 72 hours for refills to be completed.    Today you received the following chemotherapy and/or immunotherapy agents : Keytruda    To help prevent nausea and vomiting after your treatment, we encourage you to take your nausea medication as directed.  BELOW ARE SYMPTOMS THAT SHOULD BE REPORTED IMMEDIATELY: *FEVER GREATER THAN 100.4 F (38 C) OR HIGHER *CHILLS OR SWEATING *NAUSEA AND VOMITING THAT IS NOT CONTROLLED WITH YOUR NAUSEA MEDICATION *UNUSUAL SHORTNESS OF BREATH *UNUSUAL BRUISING OR BLEEDING *URINARY PROBLEMS (pain or burning when urinating, or frequent urination) *BOWEL PROBLEMS (unusual diarrhea, constipation, pain near the anus) TENDERNESS IN MOUTH AND THROAT WITH OR WITHOUT PRESENCE OF ULCERS (sore throat, sores in mouth, or a toothache) UNUSUAL RASH, SWELLING OR PAIN  UNUSUAL VAGINAL DISCHARGE OR ITCHING   Items with * indicate a potential emergency and should be followed up as soon as possible or go to the Emergency Department if any problems should occur.  Please show the CHEMOTHERAPY ALERT CARD or IMMUNOTHERAPY ALERT CARD at check-in to  the Emergency Department and triage nurse.  Should you have questions after your visit or need to cancel or reschedule your appointment, please contact [facility] CANCER [place] AT [place]  [phone] and follow the prompts.  Office hours are 8:00 a.m. to 4:30 p.m. Monday - Friday. Please note that voicemails left after 4:00 p.m. may not be returned until the following business day.  We are closed weekends and major holidays. You have access to a nurse at all times for urgent questions. Please call the main number to the clinic [phone] and follow the prompts.  For any non-urgent questions, you may also contact your provider using MyChart. We now offer e-Visits for anyone 63 and older to request care online for non-urgent symptoms. For details visit [email or web address].   Also download the MyChart app! Go to the app store, search "MyChart", open the app, select [facility], and log in with your MyChart username and password.  Due to Covid, a mask is required upon entering the hospital/clinic. If you do not have a mask, one will be given to you upon arrival. For doctor visits, patients may have 1 support person aged 63 or older with them. For treatment visits, patients cannot have anyone with them due to current Covid guidelines and our immunocompromised population.

## 2021-04-17 NOTE — Progress Notes (Signed)
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  PDL-1 10%, high TMB.  INTERVAL HISTORY: Patient returns to clinic today for further evaluation and consideration of cycle 7 of single agent Keytruda.  He currently feels well and is asymptomatic.  He is tolerating his treatments without significant side effects.  He has no neurologic complaints.  He denies any pain.  He has a good appetite and denies weight loss.  He has no chest pain, shortness of breath, cough, or hemoptysis.  He denies any nausea, vomiting, constipation, or diarrhea.  He has no urinary complaints.  Patient offers no specific complaints today.  REVIEW OF SYSTEMS:    Review of Systems  Constitutional: Negative.  Negative for fever, malaise/fatigue and weight loss.  HENT: Negative.  Negative for sore throat.   Respiratory: Negative.  Negative for cough and shortness of breath.   Cardiovascular: Negative.  Negative for chest pain and leg swelling.  Gastrointestinal: Negative.  Negative for abdominal pain.  Genitourinary: Negative.  Negative for dysuria.  Musculoskeletal: Negative.  Negative for back pain.  Skin: Negative.  Negative for rash.  Neurological: Negative.  Negative for dizziness, sensory change, focal weakness, weakness and headaches.  Psychiatric/Behavioral: Negative.  Negative for substance abuse. The patient does not have insomnia.    As per HPI. Otherwise, a complete review of systems is negative.  PAST MEDICAL HISTORY: Past Medical History:  Diagnosis Date   Alcohol abuse    Cirrhosis ([place])    COPD (chronic obstructive pulmonary disease)  (HCC)    Dementia (HCC)    Hepatitis C    Malnutrition ([place])    Throat cancer ([place])    Lung Cancer (presently)  History of throat cancer    PAST SURGICAL HISTORY: Past Surgical History:  Procedure Laterality Date   ESOPHAGOGASTRODUODENOSCOPY (EGD) WITH PROPOFOL N/A 08/03/2019   Procedure: ESOPHAGOGASTRODUODENOSCOPY (EGD) WITH PROPOFOL;  Surgeon: Lin Landsman, MD;  Location: [facility] ENDOSCOPY;  Service: Gastroenterology;  Laterality: N/A;   IR IMAGING GUIDED PORT INSERTION  04/28/2019   TONSILLECTOMY     age was 24   VASECTOMY      FAMILY HISTORY: Family History  Problem Relation Age of Onset   Breast cancer Mother     ADVANCED DIRECTIVES (Y/N):  N  HEALTH MAINTENANCE: Social History   Tobacco Use   Smoking status: Every Day    Packs/day: 0.50    Years: 63.00    Pack years: 31.50    Types: Cigarettes   Smokeless tobacco: Never  Vaping Use   Vaping Use: Never used  Substance Use Topics   Alcohol use: Not Currently    Alcohol/week: 1.0 standard drink    Types: 1 Cans of beer per week   Drug use: No     Colonoscopy:  PAP:  Bone density:  Lipid panel:  Allergies  Allergen Reactions   Penicillins Other (See Comments)    Reaction: unknown Patient is not able to answer follow up questions    Current Outpatient Medications  Medication Sig Dispense Refill   ADLARITY 5 MG/DAY PTWK Place onto the skin.     buPROPion (WELLBUTRIN SR) 150 MG 12 hr tablet bupropion HCl SR 150  mg tablet,12 hr sustained-release     docusate (COLACE) 50 MG/5ML liquid Take 100 mg by mouth daily.     ferrous sulfate 325 (65 FE) MG tablet Take 325 mg by mouth daily with breakfast.     fluticasone (FLONASE) 50 MCG/ACT nasal spray Place into both nostrils.     folic acid (FOLVITE) 1 MG tablet Take 1 mg by mouth daily.     hydrOXYzine (ATARAX/VISTARIL) 25 MG tablet Take 50 mg by mouth at bedtime.     hydrOXYzine (ATARAX/VISTARIL) 50 MG tablet Take 50 mg by mouth 2 (two) times daily.      lactose free nutrition (BOOST PLUS) LIQD Take 237 mLs by mouth 4 (four) times daily.     levothyroxine (SYNTHROID) 100 MCG tablet Take 100 mcg by mouth daily before breakfast.     lidocaine-prilocaine (EMLA) cream Apply 1 application topically as needed. Apply to port and cover with saran wrap 1-2 hours prior to port access 30 g 1   mirtazapine (REMERON) 15 MG tablet Take 15 mg by mouth at bedtime.     mupirocin ointment (BACTROBAN) 2 % Apply 1 application topically 2 (two) times daily.     ondansetron (ZOFRAN) 8 MG tablet Take by mouth every 8 (eight) hours as needed for nausea or vomiting.     polyethylene glycol (MIRALAX / GLYCOLAX) 17 g packet Take 17 g by mouth daily.     prochlorperazine (COMPAZINE) 10 MG tablet Take 1 tablet (10 mg total) by mouth every 6 (six) hours as needed (Nausea or vomiting). 60 tablet 2   QUEtiapine (SEROQUEL) 25 MG tablet Take 25 mg by mouth 3 (three) times daily.     QUEtiapine (SEROQUEL) 50 MG tablet Take 1 tablet (50 mg total) by mouth at bedtime. 30 tablet 0   spironolactone (ALDACTONE) 50 MG tablet TAKE 1 TABLET BY MOUTH EVERY DAY 90 tablet 1   tamsulosin (FLOMAX) 0.4 MG CAPS capsule Take 0.8 mg by mouth daily after breakfast.      thiamine 100 MG tablet Take 100 mg by mouth daily.     TIROSINT-SOL 150 MCG/ML SOLN Take by mouth.     TRELEGY ELLIPTA 100-62.5-25 MCG/INH AEPB Inhale 1 puff into the lungs in the morning.      vitamin C (ASCORBIC ACID) 250 MG tablet Take 500 mg by mouth daily.      Vitamin D, Ergocalciferol, (DRISDOL) 1.25 MG (50000 UNIT) CAPS capsule Take 50,000 Units by mouth every 7 (seven) days.     Cholecalciferol (VITAMIN D3) 5000 units TABS Take 5,000 Units by mouth daily. (Patient not taking: Reported on 11/19/2020)     furosemide (LASIX) 40 MG tablet Take 1 tablet (40 mg total) by mouth daily. 90 tablet 1   No current facility-administered medications for this visit.   Facility-Administered Medications Ordered in Other Visits  Medication  Dose Route Frequency Provider Last Rate Last Admin   heparin lock flush 100 UNIT/ML injection            heparin lock flush 100 unit/mL  500 Units Intravenous Once Lloyd Huger, MD        OBJECTIVE: Vitals:   04/22/21 1313  BP: 115/72  Pulse: 89  Temp: 98.6 F (37 C)     Body mass index is 17.66 kg/m.    ECOG FS:0 - Asymptomatic  General: Thin, no acute distress. Eyes: Pink conjunctiva, anicteric sclera. HEENT: Normocephalic, moist mucous membranes. Lungs: No audible wheezing or coughing. Heart: Regular rate and rhythm. Abdomen:  Soft, nontender, no obvious distention. Musculoskeletal: No edema, cyanosis, or clubbing. Neuro: Alert, answering all questions appropriately. Cranial nerves grossly intact. Skin: No rashes or petechiae noted. Psych: Normal affect.  LAB RESULTS:  Lab Results  Component Value Date   NA 136 04/22/2021   K 3.9 04/22/2021   CL 102 04/22/2021   CO2 27 04/22/2021   GLUCOSE 122 (H) 04/22/2021   BUN 20 04/22/2021   CREATININE 1.01 04/22/2021   CALCIUM 8.8 (L) 04/22/2021   PROT 7.1 04/22/2021   ALBUMIN 2.8 (L) 04/22/2021   AST 60 (H) 04/22/2021   ALT 58 (H) 04/22/2021   ALKPHOS 128 (H) 04/22/2021   BILITOT 0.9 04/22/2021   GFRNONAA >60 04/22/2021   GFRAA 74 04/09/2020    Lab Results  Component Value Date   WBC 7.1 04/22/2021   NEUTROABS 4.5 04/22/2021   HGB 11.1 (L) 04/22/2021   HCT 32.4 (L) 04/22/2021   MCV 96.4 04/22/2021   PLT 133 (L) 04/22/2021     STUDIES: No results found.   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 PD-L1 10%, with high tumor mutational burden.  PLAN:    1.  Stage IVb adenocarcinoma of the right upper lobe lung: PET scan results from November 26, 2020 reviewed independently and reported as above with likely progressive disease.  Areas of improvement are likely residual from previous when patient last received treatment with carboplatinum, pemetrexed, and  Keytruda on Jun 22, 2019.  He only received 3 cycles of treatment and then subsequently was lost to follow-up.  After lengthy discussion with patient and his caretaker, he has agreed to pursue single agent Keytruda every 3 weeks.  Proceed with cycle 7 of treatment today.  Return to clinic in 3 weeks for treatment only and then in 6 weeks for further evaluation and consideration of cycle 9.  Will get restaging PET scan prior to cycle 9.   2. 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  Patient received his last dose of weekly cisplatin on May 04, 2016.  PET scan as above. 3.  Social situation: Patient has a legal guardian. 4.  Anemia: Improved.  Patient's hemoglobin is 11.1.   5.  Hypokalemia: Resolved. 6.  Transaminitis: Chronic and unchanged.  Unclear etiology.  Proceed with treatment as above. 7.  Hypotension: Resolved.  Patient expressed understanding and was in agreement with this plan. He also understands that He can call clinic at any time with any questions, concerns, or complaints.     Cancer Staging  Adenocarcinoma, lung, right [facility]) Staging form: Lung, AJCC 8th Edition - Clinical stage from 05/02/2019: Stage IVB (cT1c, cN2, cM1c) - Signed by Lloyd Huger, MD on 05/02/2019  Primary cancer of larynx [facility]) Staging form: Larynx - Supraglottis, AJCC 8th Edition - Clinical stage from 02/27/2016: Stage IVA (cT1, cN2b, cM0) - Signed by Lloyd Huger, MD on 02/27/2016 Laterality: Right   Lloyd Huger, MD 04/22/21 1:39 PM

## 2021-04-22 ENCOUNTER — Inpatient Hospital Stay: Payer: Medicare Other | Attending: Oncology

## 2021-04-22 ENCOUNTER — Inpatient Hospital Stay: Payer: Medicare Other

## 2021-04-22 ENCOUNTER — Encounter: Payer: Self-pay | Admitting: Oncology

## 2021-04-22 ENCOUNTER — Inpatient Hospital Stay (HOSPITAL_BASED_OUTPATIENT_CLINIC_OR_DEPARTMENT_OTHER): Payer: Medicare Other | Admitting: Oncology

## 2021-04-22 ENCOUNTER — Other Ambulatory Visit: Payer: Self-pay

## 2021-04-22 VITALS — BP 115/72 | HR 89 | Temp 98.6°F | Wt 109.4 lb

## 2021-04-22 DIAGNOSIS — C7951 Secondary malignant neoplasm of bone: Secondary | ICD-10-CM | POA: Diagnosis not present

## 2021-04-22 DIAGNOSIS — Z79899 Other long term (current) drug therapy: Secondary | ICD-10-CM | POA: Diagnosis not present

## 2021-04-22 DIAGNOSIS — C3411 Malignant neoplasm of upper lobe, right bronchus or lung: Secondary | ICD-10-CM | POA: Diagnosis not present

## 2021-04-22 DIAGNOSIS — C3491 Malignant neoplasm of unspecified part of right bronchus or lung: Secondary | ICD-10-CM

## 2021-04-22 DIAGNOSIS — Z5112 Encounter for antineoplastic immunotherapy: Secondary | ICD-10-CM | POA: Diagnosis present

## 2021-04-22 LAB — COMPREHENSIVE METABOLIC PANEL
ALT: 58 U/L — ABNORMAL HIGH (ref 0–44)
AST: 60 U/L — ABNORMAL HIGH (ref 15–41)
Albumin: 2.8 g/dL — ABNORMAL LOW (ref 3.5–5.0)
Alkaline Phosphatase: 128 U/L — ABNORMAL HIGH (ref 38–126)
Anion gap: 7 (ref 5–15)
BUN: 20 mg/dL (ref 8–23)
CO2: 27 mmol/L (ref 22–32)
Calcium: 8.8 mg/dL — ABNORMAL LOW (ref 8.9–10.3)
Chloride: 102 mmol/L (ref 98–111)
Creatinine, Ser: 1.01 mg/dL (ref 0.61–1.24)
GFR, Estimated: >60 mL/min (ref >60)
Glucose, Bld: 122 mg/dL — ABNORMAL HIGH (ref 70–99)
Potassium: 3.9 mmol/L (ref 3.5–5.1)
Sodium: 136 mmol/L (ref 135–145)
Total Bilirubin: 0.9 mg/dL (ref 0.3–1.2)
Total Protein: 7.1 g/dL (ref 6.5–8.1)

## 2021-04-22 LAB — CBC WITH DIFFERENTIAL/PLATELET
Abs Immature Granulocytes: 0.05 10*3/uL (ref 0.00–0.07)
Basophils Absolute: 0 10*3/uL (ref 0.0–0.1)
Basophils Relative: 0 %
Eosinophils Absolute: 0.1 10*3/uL (ref 0.0–0.5)
Eosinophils Relative: 1 %
HCT: 32.4 % — ABNORMAL LOW (ref 39.0–52.0)
Hemoglobin: 11.1 g/dL — ABNORMAL LOW (ref 13.0–17.0)
Immature Granulocytes: 1 %
Lymphocytes Relative: 20 %
Lymphs Abs: 1.4 10*3/uL (ref 0.7–4.0)
MCH: 33 pg (ref 26.0–34.0)
MCHC: 34.3 g/dL (ref 30.0–36.0)
MCV: 96.4 fL (ref 80.0–100.0)
Monocytes Absolute: 0.9 10*3/uL (ref 0.1–1.0)
Monocytes Relative: 13 %
Neutro Abs: 4.5 10*3/uL (ref 1.7–7.7)
Neutrophils Relative %: 65 %
Platelets: 133 10*3/uL — ABNORMAL LOW (ref 150–400)
RBC: 3.36 MIL/uL — ABNORMAL LOW (ref 4.22–5.81)
RDW: 14.5 % (ref 11.5–15.5)
WBC: 7.1 10*3/uL (ref 4.0–10.5)
nRBC: 0 % (ref 0.0–0.2)

## 2021-04-22 MED ORDER — SODIUM CHLORIDE 0.9 % IV SOLN
200.0000 mg | Freq: Once | INTRAVENOUS | Status: AC
  Administered 2021-04-22: 200 mg via INTRAVENOUS
  Filled 2021-04-22: qty 200

## 2021-04-22 MED ORDER — SODIUM CHLORIDE 0.9 % IV SOLN
Freq: Once | INTRAVENOUS | Status: AC
  Filled 2021-04-22: qty 250

## 2021-04-22 MED ORDER — HEPARIN SOD (PORK) LOCK FLUSH 100 UNIT/ML IV SOLN
500.0000 [IU] | Freq: Once | INTRAVENOUS | Status: AC | PRN
  Filled 2021-04-22: qty 5

## 2021-04-22 MED ORDER — PROCHLORPERAZINE MALEATE 10 MG PO TABS
10.0000 mg | ORAL_TABLET | Freq: Once | ORAL | Status: AC
  Administered 2021-04-22: 10 mg via ORAL
  Filled 2021-04-22: qty 1

## 2021-04-22 MED ORDER — HEPARIN SOD (PORK) LOCK FLUSH 100 UNIT/ML IV SOLN
INTRAVENOUS | Status: AC
  Administered 2021-04-22: 500 [IU]
  Filled 2021-04-22: qty 5

## 2021-04-22 MED ORDER — SODIUM CHLORIDE 0.9% FLUSH
10.0000 mL | INTRAVENOUS | Status: DC | PRN
  Filled 2021-04-22: qty 10

## 2021-04-22 NOTE — Progress Notes (Signed)
Nutrition Follow-up: ? ?Patient with lung cancer with bony metastasis.  History of larynx cancer in 2018. Patient on keytruda.  ? ?Met with patient today in infusion.  Hard to gather information from patient.  Talking about how he wanted a soy protein cereal that he could eat.  "Did you know soy protein is the best protein for you to eat."  Says he has not eaten anything today.  Says that he drinks the shakes.  ? ?RD called and spoke with Madelynn Done, Director at [employer].  Madelynn Done said that his staff has made changes to add calories and protein to patients food.  Patient does not like gravies.  Staff offers him snacks, extra food and he does not want it.  He is drinking carnation breakfast essentials with whole milk in the morning and evening because he takes his medication with this drink.  Staff offers him another shake in the afternoon but often does not drink it or only drinks small amount.  Denies nausea ? ? ? ?Medications: remeron 1m for some time per CMadelynn Done? ?Labs: reviewed ? ?Anthropometrics:  ? ?Weight 109 lb today decreased ? ?112 lb on 2/14 ?113 lb 6.4 oz on 1/5 ?111 lb on 12/13 ?113 lb on 11/22 ?116 lb on 10/18 ? ?NUTRITION DIAGNOSIS: Unintentional weight loss continues ? ? ? ?INTERVENTION:  ?Spoke with CMadelynn Doneas discussed above [facility].  Staff to continue to encourage patient to eat. Will continue to offer high calorie, high protein foods.  ?Continue carnation breakfast essentials drink mixed with whole milk TID ?Message sent to MD regarding if remeron dose can be increased or add another medication to help improve appetite.  ?  ? ?MONITORING, EVALUATION, GOAL: weight trends, intake ? ? ?NEXT VISIT: Tuesday, April 18th during infusion ? ?Lamari Youngers B. AZenia Resides RD, LDN ?Registered Dietitian ?[phone](mobile) ? ? ?

## 2021-04-22 NOTE — Patient Instructions (Signed)
[facility] CANCER CTR AT [place]  Discharge Instructions: ?Thank you for choosing [place] [facility] to provide your oncology and hematology care.  ?If you have a lab appointment with the [facility], please go directly to the [facility] and check in at the registration area. ? ?Wear comfortable clothing and clothing appropriate for easy access to any Portacath or PICC line.  ? ?We strive to give you quality time with your provider. You may need to reschedule your appointment if you arrive late (15 or more minutes).  Arriving late affects you and other patients whose appointments are after yours.  Also, if you miss three or more appointments without notifying the office, you may be dismissed from the clinic at the provider?s discretion.    ?  ?For prescription refill requests, have your pharmacy contact our office and allow 72 hours for refills to be completed.   ? ?Today you received the following chemotherapy and/or immunotherapy agents - pembrolizumab    ?  ?To help prevent nausea and vomiting after your treatment, we encourage you to take your nausea medication as directed. ? ?BELOW ARE SYMPTOMS THAT SHOULD BE REPORTED IMMEDIATELY: ?*FEVER GREATER THAN 100.4 F (38 ?C) OR HIGHER ?*CHILLS OR SWEATING ?*NAUSEA AND VOMITING THAT IS NOT CONTROLLED WITH YOUR NAUSEA MEDICATION ?*UNUSUAL SHORTNESS OF BREATH ?*UNUSUAL BRUISING OR BLEEDING ?*URINARY PROBLEMS (pain or burning when urinating, or frequent urination) ?*BOWEL PROBLEMS (unusual diarrhea, constipation, pain near the anus) ?TENDERNESS IN MOUTH AND THROAT WITH OR WITHOUT PRESENCE OF ULCERS (sore throat, sores in mouth, or a toothache) ?UNUSUAL RASH, SWELLING OR PAIN  ?UNUSUAL VAGINAL DISCHARGE OR ITCHING  ? ?Items with * indicate a potential emergency and should be followed up as soon as possible or go to the Emergency Department if any problems should occur. ? ?Please show the CHEMOTHERAPY ALERT CARD or IMMUNOTHERAPY ALERT CARD at  check-in to the Emergency Department and triage nurse. ? ?Should you have questions after your visit or need to cancel or reschedule your appointment, please contact [facility] CANCER [place] AT [place]  [phone] and follow the prompts.  Office hours are 8:00 a.m. to 4:30 p.m. Monday - Friday. Please note that voicemails left after 4:00 p.m. may not be returned until the following business day.  We are closed weekends and major holidays. You have access to a nurse at all times for urgent questions. Please call the main number to the clinic [phone] and follow the prompts. ? ?For any non-urgent questions, you may also contact your provider using MyChart. We now offer e-Visits for anyone 44 and older to request care online for non-urgent symptoms. For details visit [email or web address]. ?  ?Also download the MyChart app! Go to the app store, search "MyChart", open the app, select [place], and log in with your MyChart username and password. ? ?Due to Covid, a mask is required upon entering the hospital/clinic. If you do not have a mask, one will be given to you upon arrival. For doctor visits, patients may have 1 support person aged 38 or older with them. For treatment visits, patients cannot have anyone with them due to current Covid guidelines and our immunocompromised population.  ? ?Pembrolizumab injection ?What is this medication? ?PEMBROLIZUMAB (pem broe liz ue mab) is a monoclonal antibody. It is used to treat certain types of cancer. ?This medicine may be used for other purposes; ask your health care provider or pharmacist if you have questions. ?COMMON BRAND NAME(S): Keytruda ?What should I tell my care  team before I take this medication? ?They need to know if you have any of these conditions: ?autoimmune diseases like Crohn's disease, ulcerative colitis, or lupus ?have had or planning to have an allogeneic stem cell transplant (uses someone else's stem cells) ?history of organ  transplant ?history of chest radiation ?nervous system problems like myasthenia gravis or Guillain-Barre syndrome ?an unusual or allergic reaction to pembrolizumab, other medicines, foods, dyes, or preservatives ?pregnant or trying to get pregnant ?breast-feeding ?How should I use this medication? ?This medicine is for infusion into a vein. It is given by a health care professional in a hospital or clinic setting. ?A special MedGuide will be given to you before each treatment. Be sure to read this information carefully each time. ?Talk to your pediatrician regarding the use of this medicine in children. While this drug may be prescribed for children as young as 6 months for selected conditions, precautions do apply. ?Overdosage: If you think you have taken too much of this medicine contact a poison control center or emergency room at once. ?NOTE: This medicine is only for you. Do not share this medicine with others. ?What if I miss a dose? ?It is important not to miss your dose. Call your doctor or health care professional if you are unable to keep an appointment. ?What may interact with this medication? ?Interactions have not been studied. ?This list may not describe all possible interactions. Give your health care provider a list of all the medicines, herbs, non-prescription drugs, or dietary supplements you use. Also tell them if you smoke, drink alcohol, or use illegal drugs. Some items may interact with your medicine. ?What should I watch for while using this medication? ?Your condition will be monitored carefully while you are receiving this medicine. ?You may need blood work done while you are taking this medicine. ?Do not become pregnant while taking this medicine or for 4 months after stopping it. Women should inform their doctor if they wish to become pregnant or think they might be pregnant. There is a potential for serious side effects to an unborn child. Talk to your health care professional or  pharmacist for more information. Do not breast-feed an infant while taking this medicine or for 4 months after the last dose. ?What side effects may I notice from receiving this medication? ?Side effects that you should report to your doctor or health care professional as soon as possible: ?allergic reactions like skin rash, itching or hives, swelling of the face, lips, or tongue ?bloody or black, tarry ?breathing problems ?changes in vision ?chest pain ?chills ?confusion ?constipation ?cough ?diarrhea ?dizziness or feeling faint or lightheaded ?fast or irregular heartbeat ?fever ?flushing ?joint pain ?low blood counts - this medicine may decrease the number of white blood cells, red blood cells and platelets. You may be at increased risk for infections and bleeding. ?muscle pain ?muscle weakness ?pain, tingling, numbness in the hands or feet ?persistent headache ?redness, blistering, peeling or loosening of the skin, including inside the mouth ?signs and symptoms of high blood sugar such as dizziness; dry mouth; dry skin; fruity breath; nausea; stomach pain; increased hunger or thirst; increased urination ?signs and symptoms of kidney injury like trouble passing urine or change in the amount of urine ?signs and symptoms of liver injury like dark urine, light-colored stools, loss of appetite, nausea, right upper belly pain, yellowing of the eyes or skin ?sweating ?swollen lymph nodes ?weight loss ?Side effects that usually do not require medical attention (report to your doctor  or health care professional if they continue or are bothersome): ?decreased appetite ?hair loss ?tiredness ?This list may not describe all possible side effects. Call your doctor for medical advice about side effects. You may report side effects to FDA at 1-800-FDA-1088. ?Where should I keep my medication? ?This drug is given in a hospital or clinic and will not be stored at home. ?NOTE: This sheet is a summary. It may not cover all possible  information. If you have questions about this medicine, talk to your doctor, pharmacist, or health care provider. ?? 2022 Elsevier/Gold Standard (2020-10-22 00:00:00) ? ?

## 2021-05-05 ENCOUNTER — Other Ambulatory Visit: Payer: Self-pay | Admitting: Gastroenterology

## 2021-05-13 ENCOUNTER — Inpatient Hospital Stay: Payer: Medicare Other

## 2021-05-13 ENCOUNTER — Other Ambulatory Visit: Payer: Self-pay

## 2021-05-13 VITALS — BP 137/72 | HR 85 | Temp 97.8°F | Wt 111.2 lb

## 2021-05-13 DIAGNOSIS — C3491 Malignant neoplasm of unspecified part of right bronchus or lung: Secondary | ICD-10-CM

## 2021-05-13 DIAGNOSIS — Z5112 Encounter for antineoplastic immunotherapy: Secondary | ICD-10-CM | POA: Diagnosis not present

## 2021-05-13 MED ORDER — PROCHLORPERAZINE MALEATE 10 MG PO TABS
10.0000 mg | ORAL_TABLET | Freq: Once | ORAL | Status: AC
  Administered 2021-05-13: 10 mg via ORAL
  Filled 2021-05-13: qty 1

## 2021-05-13 MED ORDER — HEPARIN SOD (PORK) LOCK FLUSH 100 UNIT/ML IV SOLN
INTRAVENOUS | Status: AC
  Administered 2021-05-13: 500 [IU]
  Filled 2021-05-13: qty 5

## 2021-05-13 MED ORDER — SODIUM CHLORIDE 0.9 % IV SOLN
Freq: Once | INTRAVENOUS | Status: AC
  Filled 2021-05-13: qty 250

## 2021-05-13 MED ORDER — HEPARIN SOD (PORK) LOCK FLUSH 100 UNIT/ML IV SOLN
500.0000 [IU] | Freq: Once | INTRAVENOUS | Status: AC | PRN
  Filled 2021-05-13: qty 5

## 2021-05-13 MED ORDER — SODIUM CHLORIDE 0.9 % IV SOLN
200.0000 mg | Freq: Once | INTRAVENOUS | Status: AC
  Administered 2021-05-13: 200 mg via INTRAVENOUS
  Filled 2021-05-13: qty 200

## 2021-05-13 NOTE — Patient Instructions (Signed)
[facility] CANCER CTR AT [place]  Discharge Instructions: ?Thank you for choosing [facility] to provide your oncology and hematology care.  ?If you have a lab appointment with the [facility], please go directly to the [facility] and check in at the registration area. ? ?Wear comfortable clothing and clothing appropriate for easy access to any Portacath or PICC line.  ? ?We strive to give you quality time with your provider. You may need to reschedule your appointment if you arrive late (15 or more minutes).  Arriving late affects you and other patients whose appointments are after yours.  Also, if you miss three or more appointments without notifying the office, you may be dismissed from the clinic at the provider?s discretion.    ?  ?For prescription refill requests, have your pharmacy contact our office and allow 72 hours for refills to be completed.   ? ?Today you received the following chemotherapy and/or immunotherapy agents Keytruda    ?  ?To help prevent nausea and vomiting after your treatment, we encourage you to take your nausea medication as directed. ? ?BELOW ARE SYMPTOMS THAT SHOULD BE REPORTED IMMEDIATELY: ?*FEVER GREATER THAN 100.4 F (38 ?C) OR HIGHER ?*CHILLS OR SWEATING ?*NAUSEA AND VOMITING THAT IS NOT CONTROLLED WITH YOUR NAUSEA MEDICATION ?*UNUSUAL SHORTNESS OF BREATH ?*UNUSUAL BRUISING OR BLEEDING ?*URINARY PROBLEMS (pain or burning when urinating, or frequent urination) ?*BOWEL PROBLEMS (unusual diarrhea, constipation, pain near the anus) ?TENDERNESS IN MOUTH AND THROAT WITH OR WITHOUT PRESENCE OF ULCERS (sore throat, sores in mouth, or a toothache) ?UNUSUAL RASH, SWELLING OR PAIN  ?UNUSUAL VAGINAL DISCHARGE OR ITCHING  ? ?Items with * indicate a potential emergency and should be followed up as soon as possible or go to the Emergency Department if any problems should occur. ? ?Please show the CHEMOTHERAPY ALERT CARD or IMMUNOTHERAPY ALERT CARD at check-in to  the Emergency Department and triage nurse. ? ?Should you have questions after your visit or need to cancel or reschedule your appointment, please contact [facility] CANCER [place] AT [place]  [phone] and follow the prompts.  Office hours are 8:00 a.m. to 4:30 p.m. Monday - Friday. Please note that voicemails left after 4:00 p.m. may not be returned until the following business day.  We are closed weekends and major holidays. You have access to a nurse at all times for urgent questions. Please call the main number to the clinic [phone] and follow the prompts. ? ?For any non-urgent questions, you may also contact your provider using MyChart. We now offer e-Visits for anyone 72 and older to request care online for non-urgent symptoms. For details visit [email or web address]. ?  ?Also download the MyChart app! Go to the app store, search "MyChart", open the app, select [place], and log in with your MyChart username and password. ? ?Due to Covid, a mask is required upon entering the hospital/clinic. If you do not have a mask, one will be given to you upon arrival. For doctor visits, patients may have 1 support person aged 24 or older with them. For treatment visits, patients cannot have anyone with them due to current Covid guidelines and our immunocompromised population.  ?

## 2021-05-20 ENCOUNTER — Other Ambulatory Visit: Payer: Self-pay

## 2021-05-20 ENCOUNTER — Encounter: Payer: Self-pay | Admitting: Family Medicine

## 2021-05-20 ENCOUNTER — Encounter: Admission: EM | Disposition: E | Payer: Self-pay | Source: Skilled Nursing Facility | Attending: Internal Medicine

## 2021-05-20 ENCOUNTER — Ambulatory Visit (HOSPITAL_COMMUNITY): Payer: Medicare Other

## 2021-05-20 ENCOUNTER — Inpatient Hospital Stay: Payer: Medicare Other | Admitting: Registered Nurse

## 2021-05-20 ENCOUNTER — Emergency Department: Payer: Medicare Other

## 2021-05-20 ENCOUNTER — Inpatient Hospital Stay
Admission: EM | Admit: 2021-05-20 | Discharge: 2021-06-16 | DRG: 480 | Disposition: E | Payer: Medicare Other | Source: Skilled Nursing Facility | Attending: Internal Medicine | Admitting: Internal Medicine

## 2021-05-20 ENCOUNTER — Inpatient Hospital Stay: Payer: Medicare Other

## 2021-05-20 DIAGNOSIS — M160 Bilateral primary osteoarthritis of hip: Secondary | ICD-10-CM | POA: Diagnosis present

## 2021-05-20 DIAGNOSIS — D638 Anemia in other chronic diseases classified elsewhere: Secondary | ICD-10-CM | POA: Diagnosis present

## 2021-05-20 DIAGNOSIS — J449 Chronic obstructive pulmonary disease, unspecified: Secondary | ICD-10-CM | POA: Diagnosis present

## 2021-05-20 DIAGNOSIS — C3411 Malignant neoplasm of upper lobe, right bronchus or lung: Secondary | ICD-10-CM | POA: Diagnosis present

## 2021-05-20 DIAGNOSIS — E039 Hypothyroidism, unspecified: Secondary | ICD-10-CM | POA: Diagnosis present

## 2021-05-20 DIAGNOSIS — D62 Acute posthemorrhagic anemia: Secondary | ICD-10-CM | POA: Diagnosis not present

## 2021-05-20 DIAGNOSIS — E440 Moderate protein-calorie malnutrition: Secondary | ICD-10-CM | POA: Diagnosis present

## 2021-05-20 DIAGNOSIS — Z803 Family history of malignant neoplasm of breast: Secondary | ICD-10-CM | POA: Diagnosis not present

## 2021-05-20 DIAGNOSIS — C3491 Malignant neoplasm of unspecified part of right bronchus or lung: Secondary | ICD-10-CM | POA: Diagnosis present

## 2021-05-20 DIAGNOSIS — Y9289 Other specified places as the place of occurrence of the external cause: Secondary | ICD-10-CM

## 2021-05-20 DIAGNOSIS — M25552 Pain in left hip: Secondary | ICD-10-CM | POA: Diagnosis present

## 2021-05-20 DIAGNOSIS — Z79899 Other long term (current) drug therapy: Secondary | ICD-10-CM

## 2021-05-20 DIAGNOSIS — Z8521 Personal history of malignant neoplasm of larynx: Secondary | ICD-10-CM

## 2021-05-20 DIAGNOSIS — R64 Cachexia: Secondary | ICD-10-CM | POA: Diagnosis present

## 2021-05-20 DIAGNOSIS — I493 Ventricular premature depolarization: Secondary | ICD-10-CM | POA: Diagnosis present

## 2021-05-20 DIAGNOSIS — N400 Enlarged prostate without lower urinary tract symptoms: Secondary | ICD-10-CM | POA: Diagnosis present

## 2021-05-20 DIAGNOSIS — S72142A Displaced intertrochanteric fracture of left femur, initial encounter for closed fracture: Secondary | ICD-10-CM | POA: Diagnosis present

## 2021-05-20 DIAGNOSIS — K7460 Unspecified cirrhosis of liver: Secondary | ICD-10-CM | POA: Diagnosis present

## 2021-05-20 DIAGNOSIS — Z880 Allergy status to penicillin: Secondary | ICD-10-CM

## 2021-05-20 DIAGNOSIS — F03911 Unspecified dementia, unspecified severity, with agitation: Secondary | ICD-10-CM | POA: Diagnosis not present

## 2021-05-20 DIAGNOSIS — D649 Anemia, unspecified: Secondary | ICD-10-CM | POA: Diagnosis not present

## 2021-05-20 DIAGNOSIS — F17210 Nicotine dependence, cigarettes, uncomplicated: Secondary | ICD-10-CM | POA: Diagnosis present

## 2021-05-20 DIAGNOSIS — S72002S Fracture of unspecified part of neck of left femur, sequela: Secondary | ICD-10-CM | POA: Diagnosis not present

## 2021-05-20 DIAGNOSIS — E43 Unspecified severe protein-calorie malnutrition: Secondary | ICD-10-CM | POA: Diagnosis present

## 2021-05-20 DIAGNOSIS — Z515 Encounter for palliative care: Secondary | ICD-10-CM | POA: Diagnosis not present

## 2021-05-20 DIAGNOSIS — S72002A Fracture of unspecified part of neck of left femur, initial encounter for closed fracture: Secondary | ICD-10-CM | POA: Diagnosis not present

## 2021-05-20 DIAGNOSIS — R1319 Other dysphagia: Secondary | ICD-10-CM | POA: Diagnosis not present

## 2021-05-20 DIAGNOSIS — J9601 Acute respiratory failure with hypoxia: Secondary | ICD-10-CM | POA: Diagnosis not present

## 2021-05-20 DIAGNOSIS — Z79890 Hormone replacement therapy (postmenopausal): Secondary | ICD-10-CM

## 2021-05-20 DIAGNOSIS — J690 Pneumonitis due to inhalation of food and vomit: Secondary | ICD-10-CM | POA: Diagnosis not present

## 2021-05-20 DIAGNOSIS — R1313 Dysphagia, pharyngeal phase: Secondary | ICD-10-CM | POA: Diagnosis not present

## 2021-05-20 DIAGNOSIS — C7951 Secondary malignant neoplasm of bone: Secondary | ICD-10-CM | POA: Diagnosis present

## 2021-05-20 DIAGNOSIS — Z681 Body mass index (BMI) 19 or less, adult: Secondary | ICD-10-CM | POA: Diagnosis not present

## 2021-05-20 DIAGNOSIS — R7989 Other specified abnormal findings of blood chemistry: Secondary | ICD-10-CM

## 2021-05-20 DIAGNOSIS — D696 Thrombocytopenia, unspecified: Secondary | ICD-10-CM | POA: Diagnosis present

## 2021-05-20 DIAGNOSIS — Z20822 Contact with and (suspected) exposure to covid-19: Secondary | ICD-10-CM | POA: Diagnosis present

## 2021-05-20 DIAGNOSIS — F03918 Unspecified dementia, unspecified severity, with other behavioral disturbance: Secondary | ICD-10-CM | POA: Diagnosis not present

## 2021-05-20 DIAGNOSIS — R1314 Dysphagia, pharyngoesophageal phase: Secondary | ICD-10-CM | POA: Diagnosis not present

## 2021-05-20 DIAGNOSIS — R9431 Abnormal electrocardiogram [ECG] [EKG]: Secondary | ICD-10-CM

## 2021-05-20 DIAGNOSIS — Z66 Do not resuscitate: Secondary | ICD-10-CM | POA: Diagnosis present

## 2021-05-20 DIAGNOSIS — Z7189 Other specified counseling: Secondary | ICD-10-CM | POA: Diagnosis not present

## 2021-05-20 DIAGNOSIS — W010XXA Fall on same level from slipping, tripping and stumbling without subsequent striking against object, initial encounter: Secondary | ICD-10-CM | POA: Diagnosis present

## 2021-05-20 DIAGNOSIS — W19XXXA Unspecified fall, initial encounter: Principal | ICD-10-CM

## 2021-05-20 DIAGNOSIS — E871 Hypo-osmolality and hyponatremia: Secondary | ICD-10-CM | POA: Diagnosis present

## 2021-05-20 DIAGNOSIS — Z7951 Long term (current) use of inhaled steroids: Secondary | ICD-10-CM

## 2021-05-20 HISTORY — PX: INTRAMEDULLARY (IM) NAIL INTERTROCHANTERIC: SHX5875

## 2021-05-20 LAB — TYPE AND SCREEN
ABO/RH(D): O POS
Antibody Screen: NEGATIVE

## 2021-05-20 LAB — COMPREHENSIVE METABOLIC PANEL
ALT: 59 U/L — ABNORMAL HIGH (ref 0–44)
AST: 63 U/L — ABNORMAL HIGH (ref 15–41)
Albumin: 2.7 g/dL — ABNORMAL LOW (ref 3.5–5.0)
Alkaline Phosphatase: 105 U/L (ref 38–126)
Anion gap: 7 (ref 5–15)
BUN: 22 mg/dL (ref 8–23)
CO2: 25 mmol/L (ref 22–32)
Calcium: 8.8 mg/dL — ABNORMAL LOW (ref 8.9–10.3)
Chloride: 100 mmol/L (ref 98–111)
Creatinine, Ser: 1.16 mg/dL (ref 0.61–1.24)
GFR, Estimated: >60 mL/min (ref >60)
Glucose, Bld: 127 mg/dL — ABNORMAL HIGH (ref 70–99)
Potassium: 3.9 mmol/L (ref 3.5–5.1)
Sodium: 132 mmol/L — ABNORMAL LOW (ref 135–145)
Total Bilirubin: 1.5 mg/dL — ABNORMAL HIGH (ref 0.3–1.2)
Total Protein: 6.8 g/dL (ref 6.5–8.1)

## 2021-05-20 LAB — CBC WITH DIFFERENTIAL/PLATELET
Abs Immature Granulocytes: 0.15 10*3/uL — ABNORMAL HIGH (ref 0.00–0.07)
Basophils Absolute: 0 10*3/uL (ref 0.0–0.1)
Basophils Relative: 0 %
Eosinophils Absolute: 0 10*3/uL (ref 0.0–0.5)
Eosinophils Relative: 0 %
HCT: 28.9 % — ABNORMAL LOW (ref 39.0–52.0)
Hemoglobin: 9.7 g/dL — ABNORMAL LOW (ref 13.0–17.0)
Immature Granulocytes: 1 %
Lymphocytes Relative: 7 %
Lymphs Abs: 0.8 10*3/uL (ref 0.7–4.0)
MCH: 31.4 pg (ref 26.0–34.0)
MCHC: 33.6 g/dL (ref 30.0–36.0)
MCV: 93.5 fL (ref 80.0–100.0)
Monocytes Absolute: 1.2 10*3/uL — ABNORMAL HIGH (ref 0.1–1.0)
Monocytes Relative: 11 %
Neutro Abs: 9.1 10*3/uL — ABNORMAL HIGH (ref 1.7–7.7)
Neutrophils Relative %: 81 %
Platelets: 157 10*3/uL (ref 150–400)
RBC: 3.09 MIL/uL — ABNORMAL LOW (ref 4.22–5.81)
RDW: 14.4 % (ref 11.5–15.5)
WBC: 11.4 10*3/uL — ABNORMAL HIGH (ref 4.0–10.5)
nRBC: 0.2 % (ref 0.0–0.2)

## 2021-05-20 LAB — TROPONIN I (HIGH SENSITIVITY)
Troponin I (High Sensitivity): 5 ng/L (ref <18)
Troponin I (High Sensitivity): 5 ng/L (ref <18)

## 2021-05-20 LAB — PROTIME-INR
INR: 1.4 — ABNORMAL HIGH (ref 0.8–1.2)
Prothrombin Time: 17.2 seconds — ABNORMAL HIGH (ref 11.4–15.2)

## 2021-05-20 LAB — RESP PANEL BY RT-PCR (FLU A&B, COVID) ARPGX2
Influenza A by PCR: NEGATIVE
Influenza B by PCR: NEGATIVE
SARS Coronavirus 2 by RT PCR: NEGATIVE

## 2021-05-20 SURGERY — FIXATION, FRACTURE, INTERTROCHANTERIC, WITH INTRAMEDULLARY ROD
Anesthesia: General | Site: Leg Upper | Laterality: Left

## 2021-05-20 MED ORDER — BUPROPION HCL ER (SR) 150 MG PO TB12
150.0000 mg | ORAL_TABLET | Freq: Every day | ORAL | Status: DC
  Administered 2021-05-20 – 2021-05-28 (×6): 150 mg via ORAL
  Filled 2021-05-20 (×11): qty 1

## 2021-05-20 MED ORDER — PHENYLEPHRINE 40 MCG/ML (10ML) SYRINGE FOR IV PUSH (FOR BLOOD PRESSURE SUPPORT)
PREFILLED_SYRINGE | INTRAVENOUS | Status: AC
  Filled 2021-05-20: qty 10

## 2021-05-20 MED ORDER — OXYCODONE HCL 5 MG/5ML PO SOLN: 5.0000 mg | Freq: Once | ORAL | Status: DC | PRN

## 2021-05-20 MED ORDER — SODIUM CHLORIDE 0.9 % IV SOLN: INTRAVENOUS | Status: DC

## 2021-05-20 MED ORDER — ONDANSETRON HCL 4 MG PO TABS: 4.0000 mg | ORAL_TABLET | Freq: Four times a day (QID) | ORAL | Status: DC | PRN

## 2021-05-20 MED ORDER — TRANEXAMIC ACID-NACL 1000-0.7 MG/100ML-% IV SOLN
INTRAVENOUS | Status: AC
  Filled 2021-05-20: qty 100

## 2021-05-20 MED ORDER — KETAMINE HCL 10 MG/ML IJ SOLN
INTRAMUSCULAR | Status: DC | PRN
  Administered 2021-05-20: 10 mg via INTRAVENOUS
  Administered 2021-05-20: 20 mg via INTRAVENOUS

## 2021-05-20 MED ORDER — FENTANYL CITRATE (PF) 100 MCG/2ML IJ SOLN: 25.0000 ug | INTRAMUSCULAR | Status: DC | PRN

## 2021-05-20 MED ORDER — OXYCODONE HCL 5 MG PO TABS: 5.0000 mg | ORAL_TABLET | Freq: Once | ORAL | Status: DC | PRN

## 2021-05-20 MED ORDER — CEFAZOLIN SODIUM-DEXTROSE 2-4 GM/100ML-% IV SOLN
INTRAVENOUS | Status: AC
  Filled 2021-05-20: qty 100

## 2021-05-20 MED ORDER — PROPOFOL 10 MG/ML IV BOLUS
INTRAVENOUS | Status: DC | PRN
Start: 2021-05-20 — End: 2021-05-20
  Administered 2021-05-20: 80 mg via INTRAVENOUS

## 2021-05-20 MED ORDER — ENOXAPARIN SODIUM 40 MG/0.4ML IJ SOSY
40.0000 mg | PREFILLED_SYRINGE | INTRAMUSCULAR | Status: DC
  Administered 2021-05-21 – 2021-05-30 (×10): 40 mg via SUBCUTANEOUS
  Filled 2021-05-20 (×11): qty 0.4

## 2021-05-20 MED ORDER — MIRTAZAPINE 15 MG PO TABS
15.0000 mg | ORAL_TABLET | Freq: Every day | ORAL | Status: DC
Start: 2021-05-20 — End: 2021-05-20

## 2021-05-20 MED ORDER — CEFAZOLIN SODIUM-DEXTROSE 1-4 GM/50ML-% IV SOLN
1.0000 g | Freq: Four times a day (QID) | INTRAVENOUS | Status: AC
  Administered 2021-05-20 – 2021-05-21 (×3): 1 g via INTRAVENOUS
  Filled 2021-05-20 (×3): qty 50

## 2021-05-20 MED ORDER — SPIRONOLACTONE 25 MG PO TABS
50.0000 mg | ORAL_TABLET | Freq: Every day | ORAL | Status: DC
Start: 2021-05-20 — End: 2021-05-31
  Administered 2021-05-21 – 2021-05-28 (×7): 50 mg via ORAL
  Filled 2021-05-20 (×7): qty 2

## 2021-05-20 MED ORDER — DONEPEZIL HCL 5 MG/DAY TD PTWK: 5.0000 mg | MEDICATED_PATCH | TRANSDERMAL | Status: DC

## 2021-05-20 MED ORDER — TRANEXAMIC ACID 1000 MG/10ML IV SOLN
INTRAVENOUS | Status: AC
  Filled 2021-05-20: qty 10

## 2021-05-20 MED ORDER — ONDANSETRON HCL 4 MG PO TABS: 8.0000 mg | ORAL_TABLET | Freq: Three times a day (TID) | ORAL | Status: DC | PRN

## 2021-05-20 MED ORDER — ROCURONIUM BROMIDE 100 MG/10ML IV SOLN
INTRAVENOUS | Status: DC | PRN
  Administered 2021-05-20 (×2): 30 mg via INTRAVENOUS

## 2021-05-20 MED ORDER — ACETAMINOPHEN 10 MG/ML IV SOLN: 1000.0000 mg | Freq: Once | INTRAVENOUS | Status: DC | PRN

## 2021-05-20 MED ORDER — OLANZAPINE 2.5 MG PO TABS
2.5000 mg | ORAL_TABLET | Freq: Two times a day (BID) | ORAL | Status: DC
  Administered 2021-05-20 – 2021-05-28 (×15): 2.5 mg via ORAL
  Filled 2021-05-20 (×23): qty 1

## 2021-05-20 MED ORDER — LEVOTHYROXINE SODIUM 50 MCG PO TABS
150.0000 ug | ORAL_TABLET | Freq: Every day | ORAL | Status: DC
  Administered 2021-05-21 – 2021-05-28 (×5): 150 ug via ORAL
  Filled 2021-05-20 (×3): qty 3
  Filled 2021-05-20: qty 1
  Filled 2021-05-20: qty 3

## 2021-05-20 MED ORDER — BISACODYL 5 MG PO TBEC
5.0000 mg | DELAYED_RELEASE_TABLET | Freq: Every day | ORAL | Status: DC | PRN
  Administered 2021-05-22: 5 mg via ORAL
  Filled 2021-05-20: qty 1

## 2021-05-20 MED ORDER — LACTATED RINGERS IV SOLN: INTRAVENOUS | Status: DC

## 2021-05-20 MED ORDER — BOOST PLUS PO LIQD
237.0000 mL | Freq: Four times a day (QID) | ORAL | Status: DC
  Filled 2021-05-20: qty 237

## 2021-05-20 MED ORDER — HYDROXYZINE HCL 25 MG PO TABS
50.0000 mg | ORAL_TABLET | Freq: Every day | ORAL | Status: DC
  Administered 2021-05-20 – 2021-05-27 (×7): 50 mg via ORAL
  Filled 2021-05-20 (×4): qty 1
  Filled 2021-05-20: qty 2
  Filled 2021-05-20 (×2): qty 1

## 2021-05-20 MED ORDER — VITAMIN D (ERGOCALCIFEROL) 1.25 MG (50000 UNIT) PO CAPS
50000.0000 [IU] | ORAL_CAPSULE | ORAL | Status: DC
  Administered 2021-05-22: 50000 [IU] via ORAL
  Filled 2021-05-20 (×2): qty 1

## 2021-05-20 MED ORDER — OLANZAPINE 7.5 MG PO TABS
15.0000 mg | ORAL_TABLET | Freq: Every day | ORAL | Status: DC
  Administered 2021-05-20 – 2021-05-27 (×6): 15 mg via ORAL
  Filled 2021-05-20: qty 2
  Filled 2021-05-20: qty 3
  Filled 2021-05-20: qty 2
  Filled 2021-05-20 (×2): qty 3
  Filled 2021-05-20 (×2): qty 2
  Filled 2021-05-20 (×3): qty 3

## 2021-05-20 MED ORDER — BUPIVACAINE-EPINEPHRINE (PF) 0.25% -1:200000 IJ SOLN
INTRAMUSCULAR | Status: DC | PRN
  Administered 2021-05-20: 30 mL via PERINEURAL

## 2021-05-20 MED ORDER — ONDANSETRON HCL 4 MG/2ML IJ SOLN: 4.0000 mg | Freq: Four times a day (QID) | INTRAMUSCULAR | Status: DC | PRN

## 2021-05-20 MED ORDER — TAMSULOSIN HCL 0.4 MG PO CAPS
0.8000 mg | ORAL_CAPSULE | Freq: Every day | ORAL | Status: DC
  Administered 2021-05-21 – 2021-05-28 (×7): 0.8 mg via ORAL
  Filled 2021-05-20 (×7): qty 2

## 2021-05-20 MED ORDER — CEFAZOLIN SODIUM-DEXTROSE 2-4 GM/100ML-% IV SOLN
2.0000 g | INTRAVENOUS | Status: AC
  Administered 2021-05-20: 2 g via INTRAVENOUS

## 2021-05-20 MED ORDER — PROCHLORPERAZINE MALEATE 10 MG PO TABS: 10.0000 mg | ORAL_TABLET | Freq: Four times a day (QID) | ORAL | Status: DC | PRN

## 2021-05-20 MED ORDER — QUETIAPINE FUMARATE 25 MG PO TABS: 25.0000 mg | ORAL_TABLET | Freq: Three times a day (TID) | ORAL | Status: DC

## 2021-05-20 MED ORDER — PHENYLEPHRINE 40 MCG/ML (10ML) SYRINGE FOR IV PUSH (FOR BLOOD PRESSURE SUPPORT)
PREFILLED_SYRINGE | INTRAVENOUS | Status: DC | PRN
  Administered 2021-05-20: 160 ug via INTRAVENOUS
  Administered 2021-05-20: 80 ug via INTRAVENOUS
  Administered 2021-05-20: 160 ug via INTRAVENOUS

## 2021-05-20 MED ORDER — POLYETHYLENE GLYCOL 3350 17 G PO PACK: 17.0000 g | PACK | Freq: Every day | ORAL | Status: DC | PRN

## 2021-05-20 MED ORDER — ADULT MULTIVITAMIN W/MINERALS CH
1.0000 | ORAL_TABLET | Freq: Every day | ORAL | Status: DC
  Administered 2021-05-20 – 2021-05-28 (×8): 1 via ORAL
  Filled 2021-05-20 (×7): qty 1

## 2021-05-20 MED ORDER — HYDROCODONE-ACETAMINOPHEN 5-325 MG PO TABS
1.0000 | ORAL_TABLET | Freq: Four times a day (QID) | ORAL | Status: DC | PRN
  Administered 2021-05-21 – 2021-05-27 (×7): 1 via ORAL
  Administered 2021-05-28 (×2): 2 via ORAL
  Administered 2021-05-28: 1 via ORAL
  Filled 2021-05-20 (×2): qty 1
  Filled 2021-05-20 (×2): qty 2
  Filled 2021-05-20 (×3): qty 1
  Filled 2021-05-20 (×2): qty 2
  Filled 2021-05-20: qty 1

## 2021-05-20 MED ORDER — ACETAMINOPHEN 10 MG/ML IV SOLN
INTRAVENOUS | Status: AC
  Filled 2021-05-20: qty 100

## 2021-05-20 MED ORDER — ACETAMINOPHEN 325 MG PO TABS
650.0000 mg | ORAL_TABLET | ORAL | Status: DC | PRN
Start: 2021-05-20 — End: 2021-05-28
  Administered 2021-05-23: 650 mg via ORAL
  Filled 2021-05-20: qty 2

## 2021-05-20 MED ORDER — EPHEDRINE 5 MG/ML INJ
INTRAVENOUS | Status: AC
  Filled 2021-05-20: qty 5

## 2021-05-20 MED ORDER — DEXAMETHASONE SODIUM PHOSPHATE 10 MG/ML IJ SOLN
INTRAMUSCULAR | Status: AC
  Filled 2021-05-20: qty 1

## 2021-05-20 MED ORDER — MAGNESIUM HYDROXIDE 400 MG/5ML PO SUSP
30.0000 mL | Freq: Every day | ORAL | Status: DC | PRN
  Administered 2021-05-22: 30 mL via ORAL
  Filled 2021-05-20: qty 30

## 2021-05-20 MED ORDER — FLUTICASONE PROPIONATE 50 MCG/ACT NA SUSP: 2.0000 | Freq: Every day | NASAL | Status: DC

## 2021-05-20 MED ORDER — TRANEXAMIC ACID-NACL 1000-0.7 MG/100ML-% IV SOLN
1000.0000 mg | INTRAVENOUS | Status: AC
  Administered 2021-05-20: 1000 mg via INTRAVENOUS

## 2021-05-20 MED ORDER — FENTANYL CITRATE (PF) 100 MCG/2ML IJ SOLN
INTRAMUSCULAR | Status: AC
  Filled 2021-05-20: qty 2

## 2021-05-20 MED ORDER — ASCORBIC ACID 500 MG PO TABS
500.0000 mg | ORAL_TABLET | Freq: Every day | ORAL | Status: DC
  Administered 2021-05-21 – 2021-05-28 (×7): 500 mg via ORAL
  Filled 2021-05-20 (×6): qty 1

## 2021-05-20 MED ORDER — THIAMINE HCL 100 MG PO TABS
100.0000 mg | ORAL_TABLET | Freq: Every day | ORAL | Status: DC
  Administered 2021-05-21 – 2021-05-28 (×7): 100 mg via ORAL
  Filled 2021-05-20 (×7): qty 1

## 2021-05-20 MED ORDER — BUPIVACAINE-EPINEPHRINE (PF) 0.25% -1:200000 IJ SOLN
INTRAMUSCULAR | Status: AC
  Filled 2021-05-20: qty 30

## 2021-05-20 MED ORDER — SUGAMMADEX SODIUM 200 MG/2ML IV SOLN
INTRAVENOUS | Status: DC | PRN
Start: 2021-05-20 — End: 2021-05-20
  Administered 2021-05-20: 200 mg via INTRAVENOUS

## 2021-05-20 MED ORDER — EPHEDRINE SULFATE (PRESSORS) 50 MG/ML IJ SOLN
INTRAMUSCULAR | Status: DC | PRN
  Administered 2021-05-20: 10 mg via INTRAVENOUS

## 2021-05-20 MED ORDER — 0.9 % SODIUM CHLORIDE (POUR BTL) OPTIME
TOPICAL | Status: DC | PRN
  Administered 2021-05-20: 500 mL

## 2021-05-20 MED ORDER — LIDOCAINE HCL (CARDIAC) PF 100 MG/5ML IV SOSY
PREFILLED_SYRINGE | INTRAVENOUS | Status: DC | PRN
  Administered 2021-05-20: 50 mg via INTRAVENOUS

## 2021-05-20 MED ORDER — LEVOTHYROXINE SODIUM 50 MCG PO TABS: 100.0000 ug | ORAL_TABLET | Freq: Every day | ORAL | Status: DC

## 2021-05-20 MED ORDER — FUROSEMIDE 40 MG PO TABS
40.0000 mg | ORAL_TABLET | Freq: Every day | ORAL | Status: DC
Start: 2021-05-20 — End: 2021-05-20

## 2021-05-20 MED ORDER — PROPOFOL 10 MG/ML IV BOLUS
INTRAVENOUS | Status: AC
  Filled 2021-05-20: qty 20

## 2021-05-20 MED ORDER — ONDANSETRON HCL 4 MG/2ML IJ SOLN
4.0000 mg | INTRAMUSCULAR | Status: DC | PRN
Start: 2021-05-20 — End: 2021-05-20

## 2021-05-20 MED ORDER — ROCURONIUM BROMIDE 10 MG/ML (PF) SYRINGE
PREFILLED_SYRINGE | INTRAVENOUS | Status: AC
  Filled 2021-05-20: qty 10

## 2021-05-20 MED ORDER — IPRATROPIUM-ALBUTEROL 0.5-2.5 (3) MG/3ML IN SOLN
3.0000 mL | RESPIRATORY_TRACT | Status: DC | PRN
  Administered 2021-05-25: 3 mL via RESPIRATORY_TRACT
  Filled 2021-05-20: qty 3

## 2021-05-20 MED ORDER — FOLIC ACID 1 MG PO TABS
1.0000 mg | ORAL_TABLET | Freq: Every day | ORAL | Status: DC
Start: 2021-05-20 — End: 2021-05-31
  Administered 2021-05-21 – 2021-05-28 (×7): 1 mg via ORAL
  Filled 2021-05-20 (×7): qty 1

## 2021-05-20 MED ORDER — METOPROLOL SUCCINATE ER 25 MG PO TB24
12.5000 mg | ORAL_TABLET | Freq: Every day | ORAL | Status: DC
Start: 2021-05-20 — End: 2021-05-31
  Administered 2021-05-20 – 2021-05-27 (×6): 12.5 mg via ORAL
  Filled 2021-05-20 (×2): qty 1
  Filled 2021-05-20 (×2): qty 0.5
  Filled 2021-05-20 (×5): qty 1

## 2021-05-20 MED ORDER — ACETAMINOPHEN 650 MG RE SUPP: 650.0000 mg | RECTAL | Status: DC | PRN

## 2021-05-20 MED ORDER — QUETIAPINE FUMARATE 25 MG PO TABS: 50.0000 mg | ORAL_TABLET | Freq: Every day | ORAL | Status: DC

## 2021-05-20 MED ORDER — DOCUSATE SODIUM 100 MG PO CAPS: 100.0000 mg | ORAL_CAPSULE | Freq: Every day | ORAL | Status: DC

## 2021-05-20 MED ORDER — ONDANSETRON HCL 4 MG/2ML IJ SOLN
INTRAMUSCULAR | Status: AC
  Filled 2021-05-20: qty 2

## 2021-05-20 MED ORDER — UMECLIDINIUM BROMIDE 62.5 MCG/ACT IN AEPB
1.0000 | INHALATION_SPRAY | Freq: Every day | RESPIRATORY_TRACT | Status: DC
Start: 2021-05-20 — End: 2021-06-02
  Administered 2021-05-22 – 2021-06-01 (×9): 1 via RESPIRATORY_TRACT
  Filled 2021-05-20 (×3): qty 7

## 2021-05-20 MED ORDER — FERROUS SULFATE 325 (65 FE) MG PO TABS
325.0000 mg | ORAL_TABLET | Freq: Every day | ORAL | Status: DC
  Administered 2021-05-22 – 2021-05-28 (×6): 325 mg via ORAL
  Filled 2021-05-20 (×7): qty 1

## 2021-05-20 MED ORDER — ENSURE ENLIVE PO LIQD
237.0000 mL | Freq: Three times a day (TID) | ORAL | Status: DC
  Administered 2021-05-20 – 2021-05-25 (×12): 237 mL via ORAL

## 2021-05-20 MED ORDER — POLYETHYLENE GLYCOL 3350 17 G PO PACK: 17.0000 g | PACK | Freq: Every day | ORAL | Status: DC

## 2021-05-20 MED ORDER — METOCLOPRAMIDE HCL 10 MG PO TABS
5.0000 mg | ORAL_TABLET | Freq: Three times a day (TID) | ORAL | Status: DC | PRN
  Filled 2021-05-20: qty 1

## 2021-05-20 MED ORDER — KETAMINE HCL 50 MG/5ML IJ SOSY
PREFILLED_SYRINGE | INTRAMUSCULAR | Status: AC
  Filled 2021-05-20: qty 5

## 2021-05-20 MED ORDER — DOCUSATE SODIUM 100 MG PO CAPS
100.0000 mg | ORAL_CAPSULE | Freq: Two times a day (BID) | ORAL | Status: DC
  Administered 2021-05-20 – 2021-05-28 (×10): 100 mg via ORAL
  Filled 2021-05-20 (×13): qty 1

## 2021-05-20 MED ORDER — FENTANYL CITRATE (PF) 100 MCG/2ML IJ SOLN
INTRAMUSCULAR | Status: DC | PRN
  Administered 2021-05-20: 25 ug via INTRAVENOUS

## 2021-05-20 MED ORDER — METOCLOPRAMIDE HCL 5 MG/ML IJ SOLN: 5.0000 mg | Freq: Three times a day (TID) | INTRAMUSCULAR | Status: DC | PRN

## 2021-05-20 MED ORDER — ACETAMINOPHEN 10 MG/ML IV SOLN
INTRAVENOUS | Status: DC | PRN
  Administered 2021-05-20: 1000 mg via INTRAVENOUS

## 2021-05-20 MED ORDER — LIDOCAINE HCL (PF) 2 % IJ SOLN
INTRAMUSCULAR | Status: AC
  Filled 2021-05-20: qty 5

## 2021-05-20 MED ORDER — MORPHINE SULFATE (PF) 2 MG/ML IV SOLN
0.5000 mg | INTRAVENOUS | Status: DC | PRN
  Administered 2021-05-23 – 2021-05-30 (×9): 0.5 mg via INTRAVENOUS
  Filled 2021-05-20 (×11): qty 1

## 2021-05-20 SURGICAL SUPPLY — 39 items
BIT DRILL CANN 16 HIP (BIT) ×1 IMPLANT
BLADE HELICAL TFNA 105 (Anchor) ×1 IMPLANT
BNDG COHESIVE 4X5 TAN ST LF (GAUZE/BANDAGES/DRESSINGS) IMPLANT
BRUSH SCRUB EZ  4% CHG (MISCELLANEOUS) ×2
BRUSH SCRUB EZ 4% CHG (MISCELLANEOUS) ×2 IMPLANT
CHLORAPREP W/TINT 26 (MISCELLANEOUS) ×2 IMPLANT
DRAPE 3/4 80X56 (DRAPES) ×2 IMPLANT
DRAPE U-SHAPE 47X51 STRL (DRAPES) ×2 IMPLANT
DRSG AQUACEL AG ADV 3.5X 4 (GAUZE/BANDAGES/DRESSINGS) IMPLANT
DRSG AQUACEL AG ADV 3.5X10 (GAUZE/BANDAGES/DRESSINGS) ×2 IMPLANT
DRSG OPSITE POSTOP 3X4 (GAUZE/BANDAGES/DRESSINGS) ×2 IMPLANT
ELECT REM PT RETURN 9FT ADLT (ELECTROSURGICAL) ×2
ELECTRODE REM PT RTRN 9FT ADLT (ELECTROSURGICAL) ×1 IMPLANT
GAUZE XEROFORM 1X8 LF (GAUZE/BANDAGES/DRESSINGS) ×2 IMPLANT
GLOVE SURG ORTHO LTX SZ8.5 (GLOVE) ×2 IMPLANT
GLOVE SURG UNDER POLY LF SZ8.5 (GLOVE) ×2 IMPLANT
GOWN STRL REUS W/ TWL LRG LVL3 (GOWN DISPOSABLE) ×1 IMPLANT
GOWN STRL REUS W/ TWL XL LVL3 (GOWN DISPOSABLE) ×1 IMPLANT
GOWN STRL REUS W/TWL LRG LVL3 (GOWN DISPOSABLE) ×1
GOWN STRL REUS W/TWL XL LVL3 (GOWN DISPOSABLE) ×1
GUIDEWIRE 3.2X400 (WIRE) ×1 IMPLANT
KIT PATIENT CARE HANA TABLE (KITS) ×2 IMPLANT
KIT TURNOVER CYSTO (KITS) ×2 IMPLANT
MANIFOLD NEPTUNE II (INSTRUMENTS) ×2 IMPLANT
MAT ABSORB  FLUID 56X50 GRAY (MISCELLANEOUS) ×1
MAT ABSORB FLUID 56X50 GRAY (MISCELLANEOUS) ×1 IMPLANT
NAIL CANN TFNA 9MM/130 360MM (Nail) ×1 IMPLANT
NDL SPNL 20GX3.5 QUINCKE YW (NEEDLE) ×1 IMPLANT
NEEDLE SPNL 20GX3.5 QUINCKE YW (NEEDLE) ×2 IMPLANT
NS IRRIG 1000ML POUR BTL (IV SOLUTION) ×2 IMPLANT
PACK HIP COMPR (MISCELLANEOUS) ×2 IMPLANT
Proximal Locking Screw (Blade) (Screw) ×1 IMPLANT
STAPLER SKIN PROX 35W (STAPLE) ×2 IMPLANT
SUT VIC AB 0 CT1 36 (SUTURE) ×2 IMPLANT
SUT VIC AB 2-0 CT1 27 (SUTURE) ×2
SUT VIC AB 2-0 CT1 TAPERPNT 27 (SUTURE) ×2 IMPLANT
SYR 30ML LL (SYRINGE) ×2 IMPLANT
TOWEL OR 17X26 4PK STRL BLUE (TOWEL DISPOSABLE) ×2 IMPLANT
WATER STERILE IRR 500ML POUR (IV SOLUTION) ×2 IMPLANT

## 2021-05-20 NOTE — Anesthesia Preprocedure Evaluation (Signed)
Anesthesia Evaluation  ?Patient identified by MRN, date of birth, ID band ?Patient awake ? ? ? ?Reviewed: ?Allergy & Precautions, NPO status , Patient's Chart, lab work & pertinent test results ? ?History of Anesthesia Complications ?Negative for: history of anesthetic complications ? ?Airway ?Mallampati: II ? ? ?Neck ROM: Full ? ? ? Dental ? ?(+) Edentulous Upper, Edentulous Lower ?  ?Pulmonary ?COPD, Current Smoker and Patient abstained from smoking.,  ?Lung adenocarcinoma ?  ?Pulmonary exam normal ?breath sounds clear to auscultation ? ? ? ? ? ? Cardiovascular ?Normal cardiovascular exam ?Rhythm:Regular Rate:Normal ? ?ECG 05/18/2021:  ?Sinus rhythm with occasional Premature ventricular complexes ?Incomplete right bundle branch block ?ST & T wave abnormality, consider anterior ischemia ?Prolonged QT ?Abnormal ECG ?  ?Neuro/Psych ?PSYCHIATRIC DISORDERS Anxiety Dementia Hx alcohol use disorder ?  ? GI/Hepatic ?(+) Cirrhosis  ?  ?  ? , Hepatitis -, CLaryngeal CA ?  ?Endo/Other  ?Hypothyroidism  ? Renal/GU ?Renal InsufficiencyRenal disease  ? ?  ?Musculoskeletal ? ? Abdominal ?  ?Peds ? Hematology ?negative hematology ROS ?(+)   ?Anesthesia Other Findings ? ? Reproductive/Obstetrics ? ?  ? ? ? ? ? ? ? ? ? ? ? ? ? ?  ?  ? ? ? ? ? ? ? ? ?Anesthesia Physical ?Anesthesia Plan ? ?ASA: 4 and emergent ? ?Anesthesia Plan: General  ? ?Post-op Pain Management:   ? ?Induction: Intravenous ? ?PONV Risk Score and Plan: 1 and Dexamethasone and Treatment may vary due to age or medical condition ? ?Airway Management Planned: Oral ETT ? ?Additional Equipment:  ? ?Intra-op Plan:  ? ?Post-operative Plan: Extubation in OR ? ?Informed Consent: I have reviewed the patients History and Physical, chart, labs and discussed the procedure including the risks, benefits and alternatives for the proposed anesthesia with the patient or authorized representative who has indicated his/her understanding and acceptance.   ? ?Patient has DNR.  ?Discussed DNR with patient, Discussed DNR with power of attorney and Suspend DNR. ?  ?Consent reviewed with POA ? ?Plan Discussed with: CRNA ? ?Anesthesia Plan Comments: (Patient and Mertha Finders, [place], consented for risks of anesthesia including but not limited to:  ?- adverse reactions to medications ?- damage to eyes, teeth, lips or other oral mucosa ?- nerve damage due to positioning  ?- sore throat or hoarseness ?- damage to heart, brain, nerves, lungs, other parts of body or loss of life ? ?Informed patient and POA about role of CRNA in peri- and intra-operative care; they voiced understanding.)  ? ? ? ? ? ? ?Anesthesia Quick Evaluation ? ?

## 2021-05-20 NOTE — Assessment & Plan Note (Addendum)
Respiratory status stable.  Continue nebulizers. ?

## 2021-05-20 NOTE — Progress Notes (Signed)
?  Progress Note ? ? ?Patient: [name] OKH:[id number] DOB: [birth year] DOA: 05/22/2021     0 ?DOS: the patient was seen and examined on 05/28/2021 ?  ?Brief hospital course: ?76 year old man with past medical history of COPD, dementia, hepatitis C, cirrhosis and malnutrition and lung cancer presents with a fall and found to have a left hip fracture.  Dr. Harlow Mares brought to the operating room on 05/19/2021. ? ?Assessment and Plan: ?* Closed left hip fracture ([place]) ?Patient seen this morning prior to surgery.  Pain control and likely will end up needing rehab. ? ?Long QT interval ?Avoid medications that can cause QT prolongation.  Change Seroquel over to Zyprexa.  Get rid of Zofran and other nausea medications.  Trial of low-dose beta-blocker at night. ? ?Obstructive chronic bronchitis without exacerbation ([place]) ?Respiratory status stable.  Continue nebulizers. ? ?Hypothyroidism ?Continue Synthroid. ? ?BPH (benign prostatic hyperplasia) ?Continue Flomax. ? ?Dementia with behavioral disturbance ([place]) ?With QT prolongation change Seroquel over to Zyprexa ? ?Elevated liver function tests ?AST and ALT slightly elevated will recheck tomorrow morning. ? ?Anemia, unspecified ?We will check hemoglobin tomorrow morning along with iron studies. ? ?Hyponatremia ?Sodium 132. ? ? ? ? ?  ? ?Subjective: Patient answers some questions appropriately.  Stated he had a fall and he just tripped.  Had some hip pain this morning.  Offers no other complaints.  No chest pain or shortness of breath ? ?Physical Exam: ?Vitals:  ? 05/28/2021 1315 06/11/2021 1330 05/22/2021 1340 05/17/2021 1405  ?BP: 137/66 123/77 133/71 125/60  ?Pulse: 90 90 89 84  ?Resp: 14 20 20 17   ?Temp: 98.3 ?F (36.8 ?C)  98.6 ?F (37 ?C) 98 ?F (36.7 ?C)  ?TempSrc:      ?SpO2: 100% 100% 99% 97%  ?Weight:      ?Height:      ? ?Physical Exam ?HENT:  ?   Head: Normocephalic.  ?   Mouth/Throat:  ?   Pharynx: No oropharyngeal exudate.  ?Eyes:  ?   General: Lids are normal.  ?    Conjunctiva/sclera: Conjunctivae normal.  ?Cardiovascular:  ?   Rate and Rhythm: Normal rate and regular rhythm.  ?   Heart sounds: Normal heart sounds, S1 normal and S2 normal.  ?Pulmonary:  ?   Breath sounds: Normal breath sounds. No decreased breath sounds, wheezing, rhonchi or rales.  ?Abdominal:  ?   Palpations: Abdomen is soft.  ?   Tenderness: There is no abdominal tenderness.  ?Musculoskeletal:  ?   Right lower leg: No swelling.  ?   Left lower leg: No swelling.  ?Skin: ?   General: Skin is warm.  ?   Findings: No rash.  ?Neurological:  ?   Mental Status: He is alert and oriented to person, place, and time.  ?  ?Data Reviewed: ?Sodium 132, AST 63 and ALT 59, hemoglobin 9.7, white blood cell count 11.4. ? ?Family Communication: Guardian contacted by nursing staff to get consent for hip fracture surgery. ? ?Disposition: ?Status is: Inpatient ?Remains inpatient appropriate because: Guarded hip fracture repair ? ?Planned Discharge Destination: Rehab ? ?Author: ?Loletha Grayer, MD ?05/19/2021 3:58 PM ? ?For on call review [email or web address].  ?

## 2021-05-20 NOTE — Assessment & Plan Note (Addendum)
Avoid medications that can cause QT prolongation.  Change Seroquel over to Zyprexa.  Get rid of Zofran and other nausea medications.  Trial of low-dose beta-blocker at night. ?

## 2021-05-20 NOTE — Hospital Course (Signed)
76 year old man with past medical history of COPD, dementia, hepatitis C, cirrhosis and malnutrition and lung cancer presents with a fall and found to have a left hip fracture.  Dr. Harlow Mares brought to the operating room on 06/07/2021. ?

## 2021-05-20 NOTE — Assessment & Plan Note (Signed)
Sodium 132. ?

## 2021-05-20 NOTE — Assessment & Plan Note (Addendum)
-   Continue Flomax 

## 2021-05-20 NOTE — Assessment & Plan Note (Addendum)
With QT prolongation change Seroquel over to Zyprexa ?

## 2021-05-20 NOTE — Consult Note (Addendum)
?ORTHOPAEDIC CONSULTATION ? ?REQUESTING PHYSICIAN: Loletha Grayer, MD ? ?Chief Complaint: left hip pain ? ?HPI: ?[name] is a 76 y.o. male who complains of left hip pain after fall. The pain is sharp in character. The pain is severe and 10/10. The pain is worse with movement and better with rest. Denies any numbness, tingling or constitutional symptoms. ? ?Past Medical History:  ?Diagnosis Date  ? Alcohol abuse   ? Cirrhosis ([place])   ? COPD (chronic obstructive pulmonary disease) ([place])   ? Dementia ([place])   ? Hepatitis C   ? Malnutrition ([place])   ? Throat cancer ([place])   ? Lung Cancer (presently)  History of throat cancer  ? ?Past Surgical History:  ?Procedure Laterality Date  ? ESOPHAGOGASTRODUODENOSCOPY (EGD) WITH PROPOFOL N/A 08/03/2019  ? Procedure: ESOPHAGOGASTRODUODENOSCOPY (EGD) WITH PROPOFOL;  Surgeon: Lin Landsman, MD;  Location: [facility] ENDOSCOPY;  Service: Gastroenterology;  Laterality: N/A;  ? IR IMAGING GUIDED PORT INSERTION  04/28/2019  ? TONSILLECTOMY    ? age was 74  ? VASECTOMY    ? ?Social History  ? ?Socioeconomic History  ? Marital status: Divorced  ?  Spouse name: Not on file  ? Number of children: Not on file  ? Years of education: Not on file  ? Highest education level: Not on file  ?Occupational History  ? Not on file  ?Tobacco Use  ? Smoking status: Every Day  ?  Packs/day: 0.50  ?  Years: 63.00  ?  Pack years: 31.50  ?  Types: Cigarettes  ? Smokeless tobacco: Never  ?Vaping Use  ? Vaping Use: Never used  ?Substance and Sexual Activity  ? Alcohol use: Not Currently  ?  Alcohol/week: 1.0 standard drink  ?  Types: 1 Cans of beer per week  ? Drug use: No  ? Sexual activity: Not Currently  ?Other Topics Concern  ? Not on file  ?Social History Narrative  ? Lives at [facility]  ? ?Social Determinants of Health  ? ?Financial Resource Strain: Not on file  ?Food Insecurity: Not on file  ?Transportation Needs: Not on file  ?Physical Activity: Not on file  ?Stress: Not on file   ?Social Connections: Not on file  ? ?Family History  ?Problem Relation Age of Onset  ? Breast cancer Mother   ? ?Allergies  ?Allergen Reactions  ? Penicillins Other (See Comments)  ?  Reaction: unknown ?Patient is not able to answer follow up questions  ? ?Prior to Admission medications   ?Medication Sig Start Date End Date Taking? Authorizing Provider  ?buPROPion (WELLBUTRIN SR) 150 MG 12 hr tablet bupropion HCl SR 150 mg tablet,12 hr sustained-release   Yes [provider]  ?docusate (COLACE) 50 MG/5ML liquid Take 100 mg by mouth daily.   Yes [provider]  ?ferrous sulfate 325 (65 FE) MG tablet Take 325 mg by mouth daily with breakfast.   Yes [provider]  ?folic acid (FOLVITE) 1 MG tablet Take 1 mg by mouth daily.   Yes [provider]  ?furosemide (LASIX) 40 MG tablet Take 1 tablet (40 mg total) by mouth daily. 08/22/19 06/04/2021 Yes [name], MD  ?hydrOXYzine (ATARAX/VISTARIL) 25 MG tablet Take 50 mg by mouth at bedtime. 02/16/19  Yes [provider]  ?QUEtiapine (SEROQUEL) 25 MG tablet Take 25 mg by mouth 2 (two) times daily.   Yes [provider]  ?QUEtiapine (SEROQUEL) 50 MG tablet Take 1 tablet (50 mg total) by mouth at bedtime.  05/26/16  Yes Mody, Ulice Bold, MD  ?spironolactone (ALDACTONE) 50 MG tablet TAKE 1 TABLET BY MOUTH EVERY DAY 05/06/21  Yes [name], MD  ?tamsulosin (FLOMAX) 0.4 MG CAPS capsule Take 0.8 mg by mouth daily after breakfast.  10/14/15  Yes [provider]  ?thiamine 100 MG tablet Take 100 mg by mouth daily.   Yes [provider]  ?TIROSINT-SOL 150 MCG/ML SOLN Take 150 mcg by mouth every morning. 10/29/20  Yes [provider]  ?Donnal Debar 100-62.5-25 MCG/INH AEPB Inhale 1 puff into the lungs in the morning.  02/28/19  Yes [provider]  ?vitamin C (ASCORBIC ACID) 250 MG tablet Take 500 mg by mouth daily.    Yes [provider]  ?ADLARITY 10 MG/DAY PTWK Place 1 patch onto the skin.  04/28/21   [provider]  ?ADLARITY 5 MG/DAY PTWK Place onto the skin. ?Patient not taking: Reported on 05/18/2021 01/06/21   [provider]  ?Cholecalciferol (VITAMIN D3) 5000 units TABS Take 5,000 Units by mouth daily. ?Patient not taking: Reported on 11/19/2020    [provider]  ?fluticasone (FLONASE) 50 MCG/ACT nasal spray Place into both nostrils. ?Patient not taking: Reported on 06/01/2021 12/24/20   [provider]  ?hydrOXYzine (ATARAX/VISTARIL) 50 MG tablet Take 50 mg by mouth 2 (two) times daily. ?Patient not taking: Reported on 05/19/2021 11/12/20   [provider]  ?lactose free nutrition (BOOST PLUS) LIQD Take 237 mLs by mouth 4 (four) times daily.    [provider]  ?levothyroxine (SYNTHROID) 100 MCG tablet Take 100 mcg by mouth daily before breakfast. ?Patient not taking: Reported on 06/13/2021    [provider]  ?lidocaine-prilocaine (EMLA) cream Apply 1 application topically as needed. Apply to port and cover with saran wrap 1-2 hours prior to port access 12/03/20   Lloyd Huger, MD  ?mirtazapine (REMERON) 15 MG tablet Take 15 mg by mouth at bedtime. 03/20/19   [provider]  ?mupirocin ointment (BACTROBAN) 2 % Apply 1 application topically 2 (two) times daily.    [provider]  ?ondansetron (ZOFRAN) 8 MG tablet Take by mouth every 8 (eight) hours as needed for nausea or vomiting.    [provider]  ?polyethylene glycol (MIRALAX / GLYCOLAX) 17 g packet Take 17 g by mouth daily.    [provider]  ?prochlorperazine (COMPAZINE) 10 MG tablet Take 1 tablet (10 mg total) by mouth every 6 (six) hours as needed (Nausea or vomiting). ?Patient not taking: Reported on 05/23/2021 05/02/19   Lloyd Huger, MD  ?Vitamin D, Ergocalciferol, (DRISDOL) 1.25 MG (50000 UNIT) CAPS capsule Take 50,000 Units by mouth every 7 (seven) days.    [provider]  ? ?DG Chest Port 1 View ? ?Result Date:  05/28/2021 ?CLINICAL DATA:  Preop.  Left hip fracture EXAM: PORTABLE CHEST 1 VIEW COMPARISON:  06/26/2019 FINDINGS: Chronic lung disease with diffuse interstitial coarsening. Asymmetric hazy density at the right apex, site of treated lung cancer. Cluster of calcifications at the left lower lobe. Porta catheter on the right with tip at the upper right atrium. Normal heart size and mediastinal contours. IMPRESSION: 1. No acute finding. 2. Treated right lung cancer. Electronically Signed   By: Jorje Guild M.D.   On: 06/15/2021 05:54  ? ?DG Hip Unilat With Pelvis 2-3 Views Left ? ?Result Date: 06/01/2021 ?CLINICAL DATA:  76 year old male with history of trauma from a fall complaining of left-sided hip pain. EXAM: DG HIP (WITH OR WITHOUT  PELVIS) 2-3V LEFT COMPARISON:  No priors. FINDINGS: Three views of the bony pelvis in the left hip demonstrate an acute mildly displaced comminuted intertrochanteric fracture of the left hip with approximately 25 degrees of varus angulation. The left femoral head remains located in the acetabulum. Other visualized portions of the bony pelvis in the right proximal femur appear intact. There is joint space narrowing, subchondral sclerosis, subchondral cyst formation and osteophyte formation in both hip joints, indicative of osteoarthritis. IMPRESSION: 1. Acute mildly displaced comminuted and angulated intratrochanteric fracture of the left hip, as above. 2. 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 Electronically Signed   By: Vinnie Langton M.D.   On: 05/31/2021 05:36   ? ?Positive ROS: All other systems have been reviewed and were otherwise negative with the exception of those mentioned in the HPI and as above. ? ?Physical Exam: ?General: Alert, no acute distress ?Cardiovascular: No pedal edema ?Respiratory: No cyanosis, no use of accessory musculature ?GI: No organomegaly, abdomen is soft and non-tender ?Skin: No lesions in the area of chief complaint ?Neurologic:  Sensation intact distally ?Psychiatric: Patient is competent for consent with normal mood and affect ?Lymphatic: No axillary or cervical lymphadenopathy ? ?MUSCULOSKELETAL: left leg short and externally rotated. Compartment

## 2021-05-20 NOTE — Anesthesia Postprocedure Evaluation (Signed)
Anesthesia Post Note ? ?Patient: [name] ? ?Procedure(s) Performed: INTRAMEDULLARY (IM) NAIL INTERTROCHANTRIC (Left: Leg Upper) ? ?Patient location during evaluation: PACU ?Anesthesia Type: General ?Level of consciousness: awake and alert and patient cooperative ?Pain management: pain level controlled ?Vital Signs Assessment: post-procedure vital signs reviewed and stable ?Respiratory status: spontaneous breathing, nonlabored ventilation and respiratory function stable ?Cardiovascular status: blood pressure returned to baseline and stable ?Postop Assessment: adequate PO intake ?Anesthetic complications: no ? ? ?No notable events documented. ? ? ?Last Vitals:  ?Vitals:  ? 06/05/2021 1340 05/26/2021 1405  ?BP: 133/71 125/60  ?Pulse: 89 84  ?Resp: 20 17  ?Temp: 37 ?C 36.7 ?C  ?SpO2: 99% 97%  ?  ?Last Pain:  ?Vitals:  ? 06/13/2021 1340  ?TempSrc:   ?PainSc: 0-No pain  ? ? ?  ?  ?  ?  ?  ?  ? ?Darrin Nipper ? ? ? ? ?

## 2021-05-20 NOTE — ED Triage Notes (Addendum)
Patient arrived via EMS from [facility] after having a mechanical fall. Pt reports left hip pain. Rotation observed to LLE with limited movement. Pt states he has had multiple falls the past few months. No LOC or blood thinners. Pt is A&O x 2. Pt was administered 66mcg of Fentanyl in route to the ED by EMS. ?

## 2021-05-20 NOTE — H&P (Signed)
?  ?  ?[place] ? ? ?PATIENT NAME: [name]   ? ?MR#:  [id number] ? ?DATE OF BIRTH:  18-Jul-1945 ? ?DATE OF ADMISSION:  05/31/2021 ? ?PRIMARY CARE PHYSICIAN: Remi Haggard, FNP  ? ?Patient is coming from: Home ? ?REQUESTING/REFERRING PHYSICIAN: Lurline Hare, MD ? ?CHIEF COMPLAINT:  ? ?Chief Complaint  ?Patient presents with  ? Fall  ? Left Hip Pain  ? ? ?HISTORY OF PRESENT ILLNESS:  ?[name] is a 76 y.o. male with medical history significant for liver cirrhosis with history of alcohol abuse and hepatitis C, COPD, dementia with behavioral changes,  BPH dementia, hepatitis C, throat and lung cancer, who presented to the ER with acute onset of left hip pain after having a mechanical fall.  The patient was getting up to urinate before he lost balance and fell.  He denies any presyncope or syncope.  No paresthesias or focal muscle weakness.  No chest pain or palpitations.  No headache or dizziness or blurred vision.  He did not have any head injuries.  No recent cough or wheezing or dyspnea or fever or chills.  No nausea or vomiting or abdominal pain.  No dysuria, oliguria or hematuria or flank pain. ? ?ED Course: When he came to the ER vital signs were within normal.  Labs are currently pending. ?EKG as reviewed by me : EKG showed normal sinus rhythm with a rate of 96 with occasional PVCs and incomplete right bundle blanch block.  Showed prolonged QT interval with QTc of 571 MS. ?Imaging: Portable chest x-ray showed the treated right lung cancer with no acute cardiopulmonary disease. ?Pelvic and left hip x-ray showed acute nondisplaced comminuted and angulated intertrochanteric fracture of the left hip and moderate severe bilateral hip joint osteoarthritis. ? ?Dr. Harlow Mares was notified about the patient.  The patient will be admitted to a medical-surgical bed for further evaluation and management. ?PAST MEDICAL HISTORY:  ? ?Past Medical History:  ?Diagnosis Date  ? Alcohol abuse   ? Cirrhosis ([place])   ? COPD  (chronic obstructive pulmonary disease) ([place])   ? Dementia ([place])   ? Hepatitis C   ? Malnutrition ([place])   ? Throat cancer ([place])   ? Lung Cancer (presently)  History of throat cancer  ? ? ?PAST SURGICAL HISTORY:  ? ?Past Surgical History:  ?Procedure Laterality Date  ? ESOPHAGOGASTRODUODENOSCOPY (EGD) WITH PROPOFOL N/A 08/03/2019  ? Procedure: ESOPHAGOGASTRODUODENOSCOPY (EGD) WITH PROPOFOL;  Surgeon: Lin Landsman, MD;  Location: [facility] ENDOSCOPY;  Service: Gastroenterology;  Laterality: N/A;  ? IR IMAGING GUIDED PORT INSERTION  04/28/2019  ? TONSILLECTOMY    ? age was 33  ? VASECTOMY    ? ? ?SOCIAL HISTORY:  ? ?Social History  ? ?Tobacco Use  ? Smoking status: Every Day  ?  Packs/day: 0.50  ?  Years: 63.00  ?  Pack years: 31.50  ?  Types: Cigarettes  ? Smokeless tobacco: Never  ?Substance Use Topics  ? Alcohol use: Not Currently  ?  Alcohol/week: 1.0 standard drink  ?  Types: 1 Cans of beer per week  ? ? ?FAMILY HISTORY:  ? ?Family History  ?Problem Relation Age of Onset  ? Breast cancer Mother   ? ? ?DRUG ALLERGIES:  ? ?Allergies  ?Allergen Reactions  ? Penicillins Other (See Comments)  ?  Reaction: unknown ?Patient is not able to answer follow up questions  ? ? ?REVIEW OF SYSTEMS:  ? ?ROS ?As per history of present illness. All pertinent systems  were reviewed above. Constitutional, HEENT, cardiovascular, respiratory, GI, GU, musculoskeletal, neuro, psychiatric, endocrine, integumentary and hematologic systems were reviewed and are otherwise negative/unremarkable except for positive findings mentioned above in the HPI. ? ? ?MEDICATIONS AT HOME:  ? ?Prior to Admission medications   ?Medication Sig Start Date End Date Taking? Authorizing Provider  ?ADLARITY 5 MG/DAY PTWK Place onto the skin. 01/06/21   [provider]  ?buPROPion (WELLBUTRIN SR) 150 MG 12 hr tablet bupropion HCl SR 150 mg tablet,12 hr sustained-release    [provider]  ?Cholecalciferol (VITAMIN D3) 5000 units TABS Take 5,000  Units by mouth daily. ?Patient not taking: Reported on 11/19/2020    [provider]  ?docusate (COLACE) 50 MG/5ML liquid Take 100 mg by mouth daily.    [provider]  ?ferrous sulfate 325 (65 FE) MG tablet Take 325 mg by mouth daily with breakfast.    [provider]  ?fluticasone (FLONASE) 50 MCG/ACT nasal spray Place into both nostrils. 12/24/20   [provider]  ?folic acid (FOLVITE) 1 MG tablet Take 1 mg by mouth daily.    [provider]  ?furosemide (LASIX) 40 MG tablet Take 1 tablet (40 mg total) by mouth daily. 08/22/19 12/03/20  [name], MD  ?hydrOXYzine (ATARAX/VISTARIL) 25 MG tablet Take 50 mg by mouth at bedtime. 02/16/19   [provider]  ?hydrOXYzine (ATARAX/VISTARIL) 50 MG tablet Take 50 mg by mouth 2 (two) times daily. 11/12/20   [provider]  ?lactose free nutrition (BOOST PLUS) LIQD Take 237 mLs by mouth 4 (four) times daily.    [provider]  ?levothyroxine (SYNTHROID) 100 MCG tablet Take 100 mcg by mouth daily before breakfast.    [provider]  ?lidocaine-prilocaine (EMLA) cream Apply 1 application topically as needed. Apply to port and cover with saran wrap 1-2 hours prior to port access 12/03/20   Lloyd Huger, MD  ?mirtazapine (REMERON) 15 MG tablet Take 15 mg by mouth at bedtime. 03/20/19   [provider]  ?mupirocin ointment (BACTROBAN) 2 % Apply 1 application topically 2 (two) times daily.    [provider]  ?ondansetron (ZOFRAN) 8 MG tablet Take by mouth every 8 (eight) hours as needed for nausea or vomiting.    [provider]  ?polyethylene glycol (MIRALAX / GLYCOLAX) 17 g packet Take 17 g by mouth daily.    [provider]  ?prochlorperazine (COMPAZINE) 10 MG tablet Take 1 tablet (10 mg total) by mouth every 6 (six) hours as needed (Nausea or vomiting). 05/02/19   Lloyd Huger, MD  ?QUEtiapine (SEROQUEL) 25 MG tablet Take 25 mg by mouth 3 (three)  times daily.    [provider]  ?QUEtiapine (SEROQUEL) 50 MG tablet Take 1 tablet (50 mg total) by mouth at bedtime. 05/26/16   Bettey Costa, MD  ?spironolactone (ALDACTONE) 50 MG tablet TAKE 1 TABLET BY MOUTH EVERY DAY 05/06/21   [name], MD  ?tamsulosin [facility]) 0.4 MG CAPS capsule Take 0.8 mg by mouth daily after breakfast.  10/14/15   [provider]  ?thiamine 100 MG tablet Take 100 mg by mouth daily.    [provider]  ?TIROSINT-SOL 150 MCG/ML SOLN Take by mouth. 10/29/20   [provider]  ?Donnal Debar 100-62.5-25 MCG/INH AEPB Inhale 1 puff into the lungs in the morning.  02/28/19   [provider]  ?vitamin C (ASCORBIC ACID) 250 MG tablet Take 500 mg by mouth daily.     [provider]  ?Vitamin D, Ergocalciferol, (DRISDOL) 1.25 MG (50000 UNIT) CAPS capsule Take 50,000 Units by mouth every 7 (seven) days.    [provider]  ? ?  ? ?VITAL SIGNS:  ?Blood pressure 122/75, pulse 98, temperature 98.2 ?F (36.8 ?C), temperature source Oral, resp. rate 19, height 5\' 6"  (1.676 m), weight 55.6 kg, SpO2 100 %. ? ?PHYSICAL EXAMINATION:  ?Physical Exam ? ?GENERAL:  76 y.o.-year-old Caucasian male patient lying in the bed with no acute distress.  ?EYES: Pupils equal, round, reactive to light and accommodation. No scleral icterus. Extraocular muscles intact.  ?HEENT: Head atraumatic, normocephalic. Oropharynx and nasopharynx clear.  ?NECK:  Supple, no jugular venous distention. No thyroid enlargement, no tenderness.  ?LUNGS: Normal breath sounds bilaterally, no wheezing, rales,rhonchi or crepitation. No use of accessory muscles of respiration.  ?CARDIOVASCULAR: Regular rate and rhythm, S1, S2 normal. No murmurs, rubs, or gallops.  ?ABDOMEN: Soft, nondistended, nontender. Bowel sounds present. No organomegaly or mass.  ?EXTREMITIES: No pedal edema, cyanosis, or clubbing. ?Musculoskeletal: Left lateral hip tenderness with lateral rotation. ?NEUROLOGIC:  Cranial nerves II through XII are intact. Muscle strength 5/5 in all extremities. Sensation intact. Gait not checked.  ?PSYCHIATRIC: The patient is alert and oriented x 3.  Normal affect and good eye contact.

## 2021-05-20 NOTE — Anesthesia Procedure Notes (Signed)
Procedure Name: Intubation ?Date/Time: 05/26/2021 12:15 PM ?Performed by: Lia Foyer, CRNA ?Pre-anesthesia Checklist: Patient identified, Emergency Drugs available, Suction available and Patient being monitored ?Patient Re-evaluated:Patient Re-evaluated prior to induction ?Oxygen Delivery Method: Circle system utilized ?Preoxygenation: Pre-oxygenation with 100% oxygen ?Induction Type: IV induction ?Ventilation: Mask ventilation without difficulty ?Laryngoscope Size: McGraph and 4 ?Grade View: Grade I ?Tube type: Oral ?Tube size: 7.0 mm ?Number of attempts: 1 ?Airway Equipment and Method: Stylet and Video-laryngoscopy ?Placement Confirmation: ETT inserted through vocal cords under direct vision, positive ETCO2 and breath sounds checked- equal and bilateral ?Secured at: 21 cm ?Tube secured with: Tape ?Dental Injury: Teeth and Oropharynx as per pre-operative assessment  ? ? ? ? ?

## 2021-05-20 NOTE — Transfer of Care (Signed)
Immediate Anesthesia Transfer of Care Note ? ?Patient: [name] ? ?Procedure(s) Performed: INTRAMEDULLARY (IM) NAIL INTERTROCHANTRIC (Left: Leg Upper) ? ?Patient Location: PACU ? ?Anesthesia Type:General ? ?Level of Consciousness: drowsy and patient cooperative ? ?Airway & Oxygen Therapy: Patient Spontanous Breathing and Patient connected to face mask oxygen ? ?Post-op Assessment: Report given to RN and Post -op Vital signs reviewed and stable ? ?Post vital signs: Reviewed and stable ? ?Last Vitals:  ?Vitals Value Taken Time  ?BP 137/66 06/15/2021 1315  ?Temp    ?Pulse 88 06/11/2021 1317  ?Resp 19 06/02/2021 1317  ?SpO2 100 % 05/22/2021 1317  ?Vitals shown include unvalidated device data. ? ?Last Pain:  ?Vitals:  ? 06/07/2021 1142  ?TempSrc: Oral  ?PainSc: 6   ?   ? ?  ? ?Complications: No notable events documented. ?

## 2021-05-20 NOTE — Assessment & Plan Note (Signed)
AST and ALT slightly elevated will recheck tomorrow morning. ?

## 2021-05-20 NOTE — Assessment & Plan Note (Addendum)
Continue Synthroid °

## 2021-05-20 NOTE — Progress Notes (Addendum)
Initial Nutrition Assessment ? ?DOCUMENTATION CODES:  ? ?Non-severe (moderate) malnutrition in context of chronic illness ? ?INTERVENTION:  ?- Encourage PO intake ?- Ensure Enlive po TID, each supplement provides 350 kcal and 20 grams of protein. ?- MVI with minerals daily ? ?NUTRITION DIAGNOSIS:  ? ?Moderate Malnutrition related to chronic illness (liver cirrhosis, h/o ETOH abuse, hepatitis C, COPD, dementia) as evidenced by moderate fat depletion, severe muscle depletion. ? ?GOAL:  ? ?Patient will meet greater than or equal to 90% of their needs ? ?MONITOR:  ? ?PO intake, Supplement acceptance, Labs, Weight trends ? ?REASON FOR ASSESSMENT:  ? ?Consult ?Hip fracture protocol ? ?ASSESSMENT:  ? ?Pt admitted from home with closed L hip fracture. PMH significant for liver cirrhosis with history of alcohol abuse, hepatitis C, COPD, dementia with behavioral changes, BPH, and throat and lung cancer. ? ?Pt is a limited historian d/t dementia and sleepy. S/p IM nail intertrocantric of L hip today. He is agreeable to receive Ensure during visit. ? ?Reviewed weight history. Current admit weight 55.6 kg (122 lbs). Weight appears to be stable and has increased from recent weights of ~51 kg. Will continue to monitor throughout admission.  ? ?Medications: Vitamin C, colace, ferrous sulfate, folvite, lasix, remeron, miralax, Vitamin D ? ?Labs: sodium 132, AST 63, ALT 59 ? ?NUTRITION - FOCUSED PHYSICAL EXAM: ? ?Flowsheet Row Most Recent Value  ?Orbital Region Moderate depletion  ?Upper Arm Region Severe depletion  ?Thoracic and Lumbar Region Moderate depletion  ?Buccal Region Moderate depletion  ?Temple Region Moderate depletion  ?Clavicle Bone Region Severe depletion  ?Clavicle and Acromion Bone Region Severe depletion  ?Scapular Bone Region Moderate depletion  ?Dorsal Hand Severe depletion  ?Patellar Region Severe depletion  ?Anterior Thigh Region Severe depletion  ?Posterior Calf Region Severe depletion  ?Edema (RD Assessment)  None  ?Hair Reviewed  ?Eyes Reviewed  ?Mouth Other (Comment)  [poor dentition]  ?Skin Reviewed  ?Nails Reviewed  ? ?  ? ? ?Diet Order:   ?Diet Order   ? ?       ?  Diet regular Room service appropriate? Yes; Fluid consistency: Thin  Diet effective now       ?  ? ?  ?  ? ?  ? ? ?EDUCATION NEEDS:  ? ?No education needs have been identified at this time ? ?Skin:  Skin Assessment: Reviewed RN Assessment ? ?Last BM:  unknown ? ?Height:  ? ?Ht Readings from Last 1 Encounters:  ?05/27/2021 5\' 6"  (1.676 m)  ? ? ?Weight:  ? ?Wt Readings from Last 1 Encounters:  ?05/30/2021 55.6 kg  ? ?BMI:  Body mass index is 19.78 kg/m?. ? ?Estimated Nutritional Needs:  ? ?Kcal:  1600-1800 ? ?Protein:  80-95g ? ?Fluid:  >/=1.6L ? ?Clayborne Dana, RDN, LDN ?Clinical Nutrition ?

## 2021-05-20 NOTE — ED Notes (Signed)
DSS called and report provided on pt and admit.  ?

## 2021-05-20 NOTE — Assessment & Plan Note (Addendum)
Patient seen this morning prior to surgery.  Pain control and likely will end up needing rehab. ?

## 2021-05-20 NOTE — ED Provider Notes (Signed)
? ?[facility] ?Provider Note ? ? ? Event Date/Time  ? First MD Initiated Contact with Patient 05/19/2021 0455   ?  (approximate) ? ? ?History  ? ?Fall, left hip pain ? ? ?HPI ? ?[name] is a 76 y.o. male brought to the ED via EMS from family care home with a chief complaint of fall with left hip pain.  Patient reports he was ambulating to the restroom when he lost his balance and fell, striking his left hip.  Denies striking his head or LOC.  Denies anticoagulant use.  Voices no other complaints or injuries.  Received 75 mcg IV Fentanyl by EMS prior to arrival. ?  ? ? ?Past Medical History  ? ?Past Medical History:  ?Diagnosis Date  ? Alcohol abuse   ? Cirrhosis ([place])   ? COPD (chronic obstructive pulmonary disease) ([place])   ? Dementia ([place])   ? Hepatitis C   ? Malnutrition ([place])   ? Throat cancer ([place])   ? Lung Cancer (presently)  History of throat cancer  ? ? ? ?Active Problem List  ? ?Patient Active Problem List  ? Diagnosis Date Noted  ? Closed left hip fracture ([place]) 05/21/2021  ? Chronic liver disease and cirrhosis ([place])   ? Hypotension 06/27/2019  ? Alcoholic hepatitis 67/67/2094  ? Anxiety 04/20/2019  ? Benign prostatic hyperplasia 04/20/2019  ? HCV (hepatitis C virus) 04/20/2019  ? History of cocaine use 04/20/2019  ? History of fall 04/20/2019  ? History of noncompliance with medical treatment 04/20/2019  ? History of gastric ulcer 04/20/2019  ? Jaw fracture ([place]) 04/20/2019  ? Skin erosion 04/20/2019  ? Tobacco abuse 04/20/2019  ? Urinary retention 04/20/2019  ? 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 Near syncope   ? Encounter for hospice care discussion   ? Dehydration   ? Renal insufficiency   ? Protein-calorie malnutrition, severe 05/14/2016  ? Throat cancer ([place])   ? Palliative care encounter   ? AKI (acute kidney injury) ([place]) 05/13/2016  ? Goals of care, counseling/discussion 03/01/2016  ? 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 Alcohol abuse 11/18/2015  ?  Dementia with behavioral disturbance ([place]) 10/21/2015  ? ? ? ?Past Surgical History  ? ?Past Surgical History:  ?Procedure Laterality Date  ? ESOPHAGOGASTRODUODENOSCOPY (EGD) WITH PROPOFOL N/A 08/03/2019  ? Procedure: ESOPHAGOGASTRODUODENOSCOPY (EGD) WITH PROPOFOL;  Surgeon: Lin Landsman, MD;  Location: [facility] ENDOSCOPY;  Service: Gastroenterology;  Laterality: N/A;  ? IR IMAGING GUIDED PORT INSERTION  04/28/2019  ? TONSILLECTOMY    ? age was 15  ? VASECTOMY    ? ? ? ?Home Medications  ? ?Prior to Admission medications   ?Medication Sig Start Date End Date Taking? Authorizing Provider  ?ADLARITY 10 MG/DAY PTWK Place 1 patch onto the skin. 04/28/21   [provider]  ?ADLARITY 5 MG/DAY PTWK Place onto the skin. 01/06/21   [provider]  ?buPROPion (WELLBUTRIN SR) 150 MG 12 hr tablet bupropion HCl SR 150 mg tablet,12 hr sustained-release    [provider]  ?Cholecalciferol (VITAMIN D3) 5000 units TABS Take 5,000 Units by mouth daily. ?Patient not taking: Reported on 11/19/2020    [provider]  ?docusate (COLACE) 50 MG/5ML liquid Take 100 mg by mouth daily.    [provider]  ?ferrous sulfate 325 (65 FE) MG tablet Take 325 mg by mouth daily with breakfast.    [provider]  ?fluticasone (FLONASE) 50 MCG/ACT nasal spray Place into both  nostrils. 12/24/20   [provider]  ?folic acid (FOLVITE) 1 MG tablet Take 1 mg by mouth daily.    [provider]  ?furosemide (LASIX) 40 MG tablet Take 1 tablet (40 mg total) by mouth daily. 08/22/19 12/03/20  [name], MD  ?hydrOXYzine (ATARAX/VISTARIL) 25 MG tablet Take 50 mg by mouth at bedtime. 02/16/19   [provider]  ?hydrOXYzine (ATARAX/VISTARIL) 50 MG tablet Take 50 mg by mouth 2 (two) times daily. 11/12/20   [provider]  ?lactose free nutrition (BOOST PLUS) LIQD Take 237 mLs by mouth 4 (four) times daily.    [provider]  ?levothyroxine (SYNTHROID) 100 MCG  tablet Take 100 mcg by mouth daily before breakfast.    [provider]  ?lidocaine-prilocaine (EMLA) cream Apply 1 application topically as needed. Apply to port and cover with saran wrap 1-2 hours prior to port access 12/03/20   Lloyd Huger, MD  ?mirtazapine (REMERON) 15 MG tablet Take 15 mg by mouth at bedtime. 03/20/19   [provider]  ?mupirocin ointment (BACTROBAN) 2 % Apply 1 application topically 2 (two) times daily.    [provider]  ?ondansetron (ZOFRAN) 8 MG tablet Take by mouth every 8 (eight) hours as needed for nausea or vomiting.    [provider]  ?polyethylene glycol (MIRALAX / GLYCOLAX) 17 g packet Take 17 g by mouth daily.    [provider]  ?prochlorperazine (COMPAZINE) 10 MG tablet Take 1 tablet (10 mg total) by mouth every 6 (six) hours as needed (Nausea or vomiting). 05/02/19   Lloyd Huger, MD  ?QUEtiapine (SEROQUEL) 25 MG tablet Take 25 mg by mouth 3 (three) times daily.    [provider]  ?QUEtiapine (SEROQUEL) 50 MG tablet Take 1 tablet (50 mg total) by mouth at bedtime. 05/26/16   Bettey Costa, MD  ?spironolactone (ALDACTONE) 50 MG tablet TAKE 1 TABLET BY MOUTH EVERY DAY 05/06/21   [name], MD  ?tamsulosin [facility]) 0.4 MG CAPS capsule Take 0.8 mg by mouth daily after breakfast.  10/14/15   [provider]  ?thiamine 100 MG tablet Take 100 mg by mouth daily.    [provider]  ?TIROSINT-SOL 150 MCG/ML SOLN Take by mouth. 10/29/20   [provider]  ?Donnal Debar 100-62.5-25 MCG/INH AEPB Inhale 1 puff into the lungs in the morning.  02/28/19   [provider]  ?vitamin C (ASCORBIC ACID) 250 MG tablet Take 500 mg by mouth daily.     [provider]  ?Vitamin D, Ergocalciferol, (DRISDOL) 1.25 MG (50000 UNIT) CAPS capsule Take 50,000 Units by mouth every 7 (seven) days.    [provider]  ? ? ? ?Allergies  ?Penicillins ? ? ?Family History  ? ?Family History   ?Problem Relation Age of Onset  ? Breast cancer Mother   ? ? ? ?Physical Exam  ?Triage Vital Signs: ?ED Triage Vitals  ?Enc Vitals Group  ?   BP   ?   Pulse   ?   Resp   ?   Temp   ?   Temp src   ?   SpO2   ?   Weight   ?   Height   ?   Head Circumference   ?   Peak Flow   ?   Pain Score   ?   Pain Loc   ?   Pain Edu?   ?   Excl. in [place]?   ? ? ?  Updated Vital Signs: ?BP 131/63 (BP Location: Left Arm)   Pulse 97   Temp 98.2 ?F (36.8 ?C) (Oral)   Resp 19   Ht 5\' 6"  (1.676 m)   Wt 55.6 kg   SpO2 100%   BMI 19.79 kg/m?  ? ? ?General: Awake, mild distress.  Cachectic. ?CV:  RRR.  Good peripheral perfusion.  ?Resp:  Normal effort.  CTA B. ?Abd:  Nontender.  No distention.  ?Other:  Pelvis is stable.  Left hip flexed, painful to extend.  Limited range of motion secondary to pain.  Appears shortened.  ?2+ distal pulses.  Brisk, less than 5-second capillary refill. ? ? ?ED Results / Procedures / Treatments  ?Labs ?(all labs ordered are listed, but only abnormal results are displayed) ?Labs Reviewed  ?RESP PANEL BY RT-PCR (FLU A&B, COVID) ARPGX2  ?CBC WITH DIFFERENTIAL/PLATELET  ?COMPREHENSIVE METABOLIC PANEL  ?PROTIME-INR  ?TROPONIN I (HIGH SENSITIVITY)  ? ? ? ?EKG ? ?ED ECG REPORT ?I, Paulette Blanch, the attending physician, personally viewed and interpreted this ECG. ? ? Date: 05/28/2021 ? EKG Time: 0605 ? Rate: 96 ? Rhythm: normal sinus rhythm ? Axis: Normal ? Intervals: QTc 571 ? ST&T Change: Nonspecific ?Compared with EKG 06/26/2019, QTc was 507 ? ? ?RADIOLOGY ?I have independently visualized and interpreted patient's left hip and chest x-rays as well as noted the radiology interpretation: ? ?Left hip x-rays: Displaced and comminuted intertrochanteric fracture ? ?Chest x-ray: No acute cardiopulmonary process ? ?Official radiology report(s): ?DG Chest Port 1 View ? ?Result Date: 05/31/2021 ?CLINICAL DATA:  Preop.  Left hip fracture EXAM: PORTABLE CHEST 1 VIEW COMPARISON:  06/26/2019 FINDINGS: Chronic lung disease with  diffuse interstitial coarsening. Asymmetric hazy density at the right apex, site of treated lung cancer. Cluster of calcifications at the left lower lobe. Porta catheter on the right with tip at the upper right atrium. Norm

## 2021-05-20 NOTE — Assessment & Plan Note (Signed)
We will check hemoglobin tomorrow morning along with iron studies. ?

## 2021-05-20 NOTE — Op Note (Signed)
DATE OF SURGERY:  06/09/2021 ? ?TIME: 1:02 PM ? ?PATIENT NAME:  [name] ? ?AGE: 76 y.o. ? ?PRE-OPERATIVE DIAGNOSIS:  Left hip fracture ? ?POST-OPERATIVE DIAGNOSIS:  SAME ? ?PROCEDURE:  LEFT INTRAMEDULLARY (IM) NAIL INTERTROCHANTRIC ? ?SURGEON:  Lovell Sheehan ? ?EBL:  50 cc ? ?COMPLICATIONS:  none apparent ? ?OPERATIVE IMPLANTS: Synthes trochanteric femoral nail  360 mm by 9 mm  with interlocking helical blade  182 mm ? ?PREOPERATIVE INDICATIONS:  [name] is a 76 y.o. year old who fell and suffered a hip fracture. He was brought into the ER and then admitted and optimized and then elected for surgical intervention.   ? ?The risks benefits and alternatives were discussed with the patient including but not limited to the risks of nonoperative treatment, versus surgical intervention including infection, bleeding, nerve injury, malunion, nonunion, hardware prominence, hardware failure, need for hardware removal, blood clots, cardiopulmonary complications, morbidity, mortality, among others, and they were willing to proceed.   ? ?OPERATIVE PROCEDURE: ? ?The patient was brought to the operating room and placed in the supine position.  General anesthesia was administered, with a foley. He was placed on the fracture table.  Closed reduction was performed under C-arm guidance. The length of the femur was also measured using fluoroscopy. Time out was then performed after sterile prep and drape. He received preoperative antibiotics. ? ?Incision was made proximal to the greater trochanter. A guidewire was placed in the appropriate position. Confirmation was made on AP and lateral views. The above-named nail was opened. I opened the proximal femur with a reamer. I then placed the nail by hand easily down. I did not need to ream the femur. ? ?Once the nail was completely seated, I placed a guidepin into the femoral head into the center center position through a second incision.  I measured the length, and then reamed  the lateral cortex and up into the head. I then placed the helical blade. Slight compression was applied. Anatomic fixation achieved. Bone quality was poor. ? ?I then secured the proximal interlock.  I then removed the instruments, and took final C-arm pictures AP and lateral the entire length of the leg. Anatomic reconstruction was achieved, and the wounds were irrigated copiously and closed with Vicryl  followed by staples and dry sterile dressing. Sponge and needle count were correct.   The patient was awakened and returned to PACU in stable and satisfactory condition. There no complications and the patient tolerated the procedure well. ? ?He will be weightbearing as tolerated.   ? ?Lovell Sheehan ? ?

## 2021-05-21 DIAGNOSIS — E440 Moderate protein-calorie malnutrition: Secondary | ICD-10-CM | POA: Insufficient documentation

## 2021-05-21 DIAGNOSIS — D649 Anemia, unspecified: Secondary | ICD-10-CM | POA: Diagnosis not present

## 2021-05-21 DIAGNOSIS — S72002S Fracture of unspecified part of neck of left femur, sequela: Secondary | ICD-10-CM | POA: Diagnosis not present

## 2021-05-21 DIAGNOSIS — F03918 Unspecified dementia, unspecified severity, with other behavioral disturbance: Secondary | ICD-10-CM | POA: Diagnosis not present

## 2021-05-21 LAB — COMPREHENSIVE METABOLIC PANEL
ALT: 38 U/L (ref 0–44)
AST: 46 U/L — ABNORMAL HIGH (ref 15–41)
Albumin: 2 g/dL — ABNORMAL LOW (ref 3.5–5.0)
Alkaline Phosphatase: 94 U/L (ref 38–126)
Anion gap: 4 — ABNORMAL LOW (ref 5–15)
BUN: 24 mg/dL — ABNORMAL HIGH (ref 8–23)
CO2: 24 mmol/L (ref 22–32)
Calcium: 8.2 mg/dL — ABNORMAL LOW (ref 8.9–10.3)
Chloride: 106 mmol/L (ref 98–111)
Creatinine, Ser: 1.12 mg/dL (ref 0.61–1.24)
GFR, Estimated: >60 mL/min (ref >60)
Glucose, Bld: 119 mg/dL — ABNORMAL HIGH (ref 70–99)
Potassium: 4 mmol/L (ref 3.5–5.1)
Sodium: 134 mmol/L — ABNORMAL LOW (ref 135–145)
Total Bilirubin: 0.8 mg/dL (ref 0.3–1.2)
Total Protein: 5.4 g/dL — ABNORMAL LOW (ref 6.5–8.1)

## 2021-05-21 LAB — CBC
HCT: 21.9 % — ABNORMAL LOW (ref 39.0–52.0)
Hemoglobin: 7.4 g/dL — ABNORMAL LOW (ref 13.0–17.0)
MCH: 31.5 pg (ref 26.0–34.0)
MCHC: 33.8 g/dL (ref 30.0–36.0)
MCV: 93.2 fL (ref 80.0–100.0)
Platelets: 132 10*3/uL — ABNORMAL LOW (ref 150–400)
RBC: 2.35 MIL/uL — ABNORMAL LOW (ref 4.22–5.81)
RDW: 14.4 % (ref 11.5–15.5)
WBC: 19.1 10*3/uL — ABNORMAL HIGH (ref 4.0–10.5)
nRBC: 0 % (ref 0.0–0.2)

## 2021-05-21 MED ORDER — HYDROCODONE-ACETAMINOPHEN 5-325 MG PO TABS
1.0000 | ORAL_TABLET | Freq: Four times a day (QID) | ORAL | 0 refills | Status: AC | PRN
Start: 2021-05-21 — End: 2021-05-26

## 2021-05-21 MED ORDER — ENOXAPARIN SODIUM 40 MG/0.4ML IJ SOSY: 40.0000 mg | PREFILLED_SYRINGE | INTRAMUSCULAR | 0 refills | Status: AC

## 2021-05-21 NOTE — Evaluation (Signed)
Physical Therapy Evaluation ?Patient Details ?Name: [name] ?MRN: [id number] ?DOB: 05-07-1945 ?Today's Date: 05/21/2021 ? ?History of Present Illness ? Pt is a 76 y.o. male with medical history significant for liver cirrhosis with history of alcohol abuse and hepatitis C, COPD, dementia with behavioral changes, BPH, dementia, hepatitis C, and throat and lung cancer, who presented to the ER with acute onset of left hip pain after having a mechanical fall.  MD assessment includes closed left hip fracture now s/p IM nail, long QT interval, hypothyroidism, hyponatremia, anemia, and elevated liver function tests. ?  ?Clinical Impression ? Pt required extensive encouragement and motivation to participate throughout the session.  Pt frequently stated that he wouldn't be able to do whatever functional task we were preparing for even before attempting the task.  Pt ultimately required total assist with bed mobility and transfers and once in standing required mod A for stability and was unable to advance either LE during amb attempt.  Pt will benefit from PT services in a SNF setting upon discharge to safely address deficits listed in patient problem list for decreased caregiver assistance and eventual return to PLOF. ? ?   ? ?Recommendations for follow up therapy are one component of a multi-disciplinary discharge planning process, led by the attending physician.  Recommendations may be updated based on patient status, additional functional criteria and insurance authorization. ? ?Follow Up Recommendations Skilled nursing-short term rehab (<3 hours/day) ? ?  ?Assistance Recommended at Discharge Frequent or constant Supervision/Assistance  ?Patient can return home with the following ? Two people to help with walking and/or transfers;Two people to help with bathing/dressing/bathroom;Direct supervision/assist for medications management;Assistance with cooking/housework;Assist for transportation ? ?  ?Equipment Recommendations  Rolling walker (2 wheels)  ?Recommendations for Other Services ?    ?  ?Functional Status Assessment Patient has had a recent decline in their functional status and demonstrates the ability to make significant improvements in function in a reasonable and predictable amount of time.  ? ?  ?Precautions / Restrictions Precautions ?Precautions: Fall ?Restrictions ?Weight Bearing Restrictions: Yes ?LLE Weight Bearing: Weight bearing as tolerated  ? ?  ? ?Mobility ? Bed Mobility ?Overal bed mobility: Needs Assistance ?Bed Mobility: Supine to Sit, Sit to Supine ?  ?  ?Supine to sit: HOB elevated, +2 for physical assistance, Total assist ?Sit to supine: Total assist, +2 for physical assistance, HOB elevated ?  ?General bed mobility comments: Max verbal and tactile cues for sequencing but with pt putting forth very little effort ?  ? ?Transfers ?Overall transfer level: Needs assistance ?Equipment used: Rolling walker (2 wheels) ?Transfers: Sit to/from Stand ?Sit to Stand: From elevated surface, Total assist, +2 physical assistance ?  ?  ?  ?  ?  ?General transfer comment: Max verbal cues for sequencing with pt requiring near total assist to come to standing and then +2 mod A to remain in standing ?  ? ?Ambulation/Gait ?  ?  ?  ?  ?  ?  ?  ?General Gait Details: Unable to advance either LE ? ?Stairs ?  ?  ?  ?  ?  ? ?Wheelchair Mobility ?  ? ?Modified Rankin (Stroke Patients Only) ?  ? ?  ? ?Balance Overall balance assessment: Needs assistance ?Sitting-balance support: Feet supported, Bilateral upper extremity supported ?Sitting balance-Leahy Scale: Fair ?  ?  ?Standing balance support: Bilateral upper extremity supported ?Standing balance-Leahy Scale: Poor ?  ?  ?  ?  ?  ?  ?  ?  ?  ?  ?  ?  ?   ? ? ? ?  Pertinent Vitals/Pain Pain Assessment ?Pain Assessment: 0-10 ?Pain Score: 2  ?Pain Location: L hip ?Pain Descriptors / Indicators: Sore ?Pain Intervention(s): Repositioned, Premedicated before session, Monitored during session   ? ? ?Home Living Family/patient expects to be discharged to:: Group home ?  ?  ?  ?  ?  ?  ?  ?  ?  ?Additional Comments: [facility] resident, level entry with 24/7 staff supervision  ?  ?Prior Function Prior Level of Function : Independent/Modified Independent ?  ?  ?  ?  ?  ?  ?Mobility Comments: Ind amb facility distances without an AD, multiple falls in the last year with pt uncertain of cause ?ADLs Comments: Ind with ADLs with staff assisting with meals and meds ?  ? ? ?Hand Dominance  ?   ? ?  ?Extremity/Trunk Assessment  ? Upper Extremity Assessment ?Upper Extremity Assessment: Generalized weakness ?  ? ?Lower Extremity Assessment ?Lower Extremity Assessment: Generalized weakness;LLE deficits/detail ?LLE: Unable to fully assess due to pain ?  ? ?   ?Communication  ? Communication: No difficulties  ?Cognition Arousal/Alertness: Awake/alert ?Behavior During Therapy: [facility] for tasks assessed/performed ?Overall Cognitive Status: No family/caregiver present to determine baseline cognitive functioning ?  ?  ?  ?  ?  ?  ?  ?  ?  ?  ?  ?  ?  ?  ?  ?  ?  ?  ?  ? ?  ?General Comments   ? ?  ?Exercises Total Joint Exercises ?Heel Slides: AAROM, Left, 5 reps ?Hip ABduction/ADduction: AAROM, Left, 5 reps ?Straight Leg Raises: AAROM, Left, 5 reps ?Long Arc Quad: AROM, Strengthening, Both, 10 reps, 5 reps ?Knee Flexion: AROM, Strengthening, Both, 5 reps, 10 reps  ? ?Assessment/Plan  ?  ?PT Assessment Patient needs continued PT services  ?PT Problem List Decreased strength;Decreased activity tolerance;Decreased balance;Decreased mobility;Decreased knowledge of use of DME;Pain;Decreased safety awareness ? ?   ?  ?PT Treatment Interventions DME instruction;Gait training;Functional mobility training;Therapeutic activities;Therapeutic exercise;Balance training;Patient/family education   ? ?PT Goals (Current goals can be found in the Care Plan section)  ?Acute Rehab PT Goals ?Patient Stated Goal: To get back to  walking ?PT Goal Formulation: With patient ?Time For Goal Achievement: 06-20-21 ?Potential to Achieve Goals: Fair ? ?  ?Frequency 7X/week ?  ? ? ?Co-evaluation   ?  ?  ?  ?  ? ? ?  ?AM-PAC PT "6 Clicks" Mobility  ?Outcome Measure Help needed turning from your back to your side while in a flat bed without using bedrails?: Total ?Help needed moving from lying on your back to sitting on the side of a flat bed without using bedrails?: Total ?Help needed moving to and from a bed to a chair (including a wheelchair)?: Total ?Help needed standing up from a chair using your arms (e.g., wheelchair or bedside chair)?: Total ?Help needed to walk in hospital room?: Total ?Help needed climbing 3-5 steps with a railing? : Total ?6 Click Score: 6 ? ?  ?End of Session Equipment Utilized During Treatment: Gait belt ?Activity Tolerance: Patient limited by pain ?Patient left: in bed;with call bell/phone within reach;with bed alarm set;with nursing/sitter in room ?Nurse Communication: Mobility status ?PT Visit Diagnosis: Unsteadiness on feet (R26.81);History of falling (Z91.81);Difficulty in walking, not elsewhere classified (R26.2);Muscle weakness (generalized) (M62.81);Pain ?Pain - Right/Left: Left ?Pain - part of body: Hip ?  ? ?Time: 1010-1039 ?PT Time Calculation (min) (ACUTE ONLY): 29 min ? ? ?Charges:   PT Evaluation ?$  PT Eval Moderate Complexity: 1 Mod ?PT Treatments ?$Therapeutic Exercise: 8-22 mins ?  ?   ? ?D. Royetta Asal PT, DPT ?05/21/21, 11:04 AM ? ? ? ?

## 2021-05-21 NOTE — Progress Notes (Signed)
?PROGRESS NOTE ? ? ? ?[name]  HYW:[id number] DOB: May 30, 1945 DOA: 06/10/2021 ?PCP: Remi Haggard, FNP ? ?Assessment & Plan: ?  ?Principal Problem: ?  Closed left hip fracture ([place]) ?Active Problems: ?  Long QT interval ?  Obstructive chronic bronchitis without exacerbation ([place]) ?  Hypothyroidism ?  Dementia with behavioral disturbance ([place]) ?  BPH (benign prostatic hyperplasia) ?  Hyponatremia ?  Anemia, unspecified ?  Elevated liver function tests ?  Malnutrition of moderate degree ? ? ?Closed left hip fracture: s/p left intramedullary nail intertrochanteric as per ortho surg on 05/17/2021. PT/OT recs SNF  ?  ?Long QT interval: hold seroquel. Continue to monitor  ?  ?Chronic bronchitis: without exacerbation. Continue on bronchodilators  ?  ?Hypothyroidism: continue on levothyroxine  ?  ?BPH: continue on flomax ?  ?Dementia: with behavioral disturbance. Continue w/ olanzapine which was changed from seroquel secondary to QT prolongation  ? ?Transaminitis: ALT is WNL but AST is still elevated but trending down. Will continue to monitor  ? ?Thrombocytopenia: etiology unclear. Will continue to monitor  ?  ?Normocytic anemia: will transfuse if Hb < 7.0 ?  ?Hyponatremia: trending up from day prior, almost WNLT ? ? ?DVT prophylaxis: lovenox  ?Code Status: DNR  ?Family Communication: called pt's legal guardian but no answer  ?Disposition Plan:  likely d/c to SNF. ? ?Level of care: Med-Surg ?Status is: Inpatient ?Remains inpatient appropriate because: needs SNF placement  ? ? ? ?Consultants:  ?Ortho surg  ? ?Procedures:  ? ?Antimicrobials:  ? ? ?Subjective: ?Pt c/o hip pain  ? ?Objective: ?Vitals:  ? 05/17/2021 1729 06/06/2021 1952 05/21/21 0356 05/21/21 0802  ?BP: 132/73 (!) 107/91 (!) 109/56 (!) 103/53  ?Pulse: 94 96 86 81  ?Resp: 18 18 18 16   ?Temp: 98.2 ?F (36.8 ?C) 98.6 ?F (37 ?C) 98.2 ?F (36.8 ?C) (!) 97.4 ?F (36.3 ?C)  ?TempSrc:      ?SpO2: 100% 99% 94% 94%  ?Weight:      ?Height:      ? ? ?Intake/Output Summary  (Last 24 hours) at 05/21/2021 0840 ?Last data filed at 05/21/2021 0500 ?Gross per 24 hour  ?Intake 2603.33 ml  ?Output --  ?Net 2603.33 ml  ? ?Filed Weights  ? 05/25/2021 0541 05/19/2021 1142  ?Weight: 55.6 kg 55.6 kg  ? ? ?Examination: ? ?General exam: Appears calm and comfortable  ?Respiratory system: Clear to auscultation. Respiratory effort normal. ?Cardiovascular system: S1 & S2+. No  rubs, gallops or clicks. No pedal edema. ?Gastrointestinal system: Abdomen is nondistended, soft and nontender. Normal bowel sounds heard. ?Central nervous system: Alert and awake. Moves all extremities  ?Psychiatry: Judgement and insight appears poor. Flat mood and affect  ? ? ? ?Data Reviewed: I have personally reviewed following labs and imaging studies ? ?CBC: ?Recent Labs  ?Lab 05/19/2021 ?4854 05/21/21 ?0429  ?WBC 11.4* 19.1*  ?NEUTROABS 9.1*  --   ?HGB 9.7* 7.4*  ?HCT 28.9* 21.9*  ?MCV 93.5 93.2  ?PLT 157 132*  ? ?Basic Metabolic Panel: ?Recent Labs  ?Lab 06/07/2021 ?6270 05/21/21 ?0429  ?NA 132* 134*  ?K 3.9 4.0  ?CL 100 106  ?CO2 25 24  ?GLUCOSE 127* 119*  ?BUN 22 24*  ?CREATININE 1.16 1.12  ?CALCIUM 8.8* 8.2*  ? ?GFR: ?Estimated Creatinine Clearance: 42.7 mL/min (by C-G formula based on SCr of 1.12 mg/dL). ?Liver Function Tests: ?Recent Labs  ?Lab 05/29/2021 ?3500 05/21/21 ?0429  ?AST 63* 46*  ?ALT 59* 38  ?ALKPHOS 105 94  ?BILITOT 1.5*  0.8  ?PROT 6.8 5.4*  ?ALBUMIN 2.7* 2.0*  ? ?No results for input(s): LIPASE, AMYLASE in the last 168 hours. ?No results for input(s): AMMONIA in the last 168 hours. ?Coagulation Profile: ?Recent Labs  ?Lab 06/14/2021 ?0174  ?INR 1.4*  ? ?Cardiac Enzymes: ?No results for input(s): CKTOTAL, CKMB, CKMBINDEX, TROPONINI in the last 168 hours. ?BNP (last 3 results) ?No results for input(s): PROBNP in the last 8760 hours. ?HbA1C: ?No results for input(s): HGBA1C in the last 72 hours. ?CBG: ?No results for input(s): GLUCAP in the last 168 hours. ?Lipid Profile: ?No results for input(s): CHOL, HDL, LDLCALC, TRIG,  CHOLHDL, LDLDIRECT in the last 72 hours. ?Thyroid Function Tests: ?No results for input(s): TSH, T4TOTAL, FREET4, T3FREE, THYROIDAB in the last 72 hours. ?Anemia Panel: ?No results for input(s): VITAMINB12, FOLATE, FERRITIN, TIBC, IRON, RETICCTPCT in the last 72 hours. ?Sepsis Labs: ?No results for input(s): PROCALCITON, LATICACIDVEN in the last 168 hours. ? ?Recent Results (from the past 240 hour(s))  ?Resp Panel by RT-PCR (Flu A&B, Covid) Nasopharyngeal Swab     Status: None  ? Collection Time: 05/18/2021  5:57 AM  ? Specimen: Nasopharyngeal Swab; Nasopharyngeal(NP) swabs in vial transport medium  ?Result Value Ref Range Status  ? SARS Coronavirus 2 by RT PCR NEGATIVE NEGATIVE Final  ?  Comment: (NOTE) ?SARS-CoV-2 target nucleic acids are NOT DETECTED. ? ?The SARS-CoV-2 RNA is generally detectable in upper respiratory ?specimens during the acute phase of infection. The lowest ?concentration of SARS-CoV-2 viral copies this assay can detect is ?138 copies/mL. A negative result does not preclude SARS-Cov-2 ?infection and should not be used as the sole basis for treatment or ?other patient management decisions. A negative result may occur with  ?improper specimen collection/handling, submission of specimen other ?than nasopharyngeal swab, presence of viral mutation(s) within the ?areas targeted by this assay, and inadequate number of viral ?copies(<138 copies/mL). A negative result must be combined with ?clinical observations, patient history, and epidemiological ?information. The expected result is Negative. ? ?Fact Sheet for Patients:  ?EntrepreneurPulse.com.au ? ?Fact Sheet for Healthcare Providers:  ?IncredibleEmployment.be ? ?This test is no t yet approved or cleared by the [place] FDA and  ?has been authorized for detection and/or diagnosis of SARS-CoV-2 by ?FDA under an Emergency Use Authorization (EUA). This EUA will remain  ?in effect (meaning this test can be used) for  the duration of the ?COVID-19 declaration under Section 564(b)(1) of the Act, 21 ?U.S.C.section 360bbb-3(b)(1), unless the authorization is terminated  ?or revoked sooner.  ? ? ?  ? Influenza A by PCR NEGATIVE NEGATIVE Final  ? Influenza B by PCR NEGATIVE NEGATIVE Final  ?  Comment: (NOTE) ?The Xpert Xpress SARS-CoV-2/FLU/RSV plus assay is intended as an aid ?in the diagnosis of influenza from Nasopharyngeal swab specimens and ?should not be used as a sole basis for treatment. Nasal washings and ?aspirates are unacceptable for Xpert Xpress SARS-CoV-2/FLU/RSV ?testing. ? ?Fact Sheet for Patients: ?EntrepreneurPulse.com.au ? ?Fact Sheet for Healthcare Providers: ?IncredibleEmployment.be ? ?This test is not yet approved or cleared by the [place] FDA and ?has been authorized for detection and/or diagnosis of SARS-CoV-2 by ?FDA under an Emergency Use Authorization (EUA). This EUA will remain ?in effect (meaning this test can be used) for the duration of the ?COVID-19 declaration under Section 564(b)(1) of the Act, 21 U.S.C. ?section 360bbb-3(b)(1), unless the authorization is terminated or ?revoked. ? ?Performed at [facility], [place], ?[place] [address] ?  ?  ? ? ? ? ? ?  Radiology Studies: ?DG Chest Port 1 View ? ?Result Date: 06/13/2021 ?CLINICAL DATA:  Preop.  Left hip fracture EXAM: PORTABLE CHEST 1 VIEW COMPARISON:  06/26/2019 FINDINGS: Chronic lung disease with diffuse interstitial coarsening. Asymmetric hazy density at the right apex, site of treated lung cancer. Cluster of calcifications at the left lower lobe. Porta catheter on the right with tip at the upper right atrium. Normal heart size and mediastinal contours. IMPRESSION: 1. No acute finding. 2. Treated right lung cancer. Electronically Signed   By: Jorje Guild M.D.   On: 05/22/2021 05:54  ? ?DG C-Arm 1-60 Min-No Report ? ?Result Date: 06/15/2021 ?Fluoroscopy was utilized by the requesting  physician.  No radiographic interpretation.  ? ?DG HIP UNILAT WITH PELVIS 2-3 VIEWS LEFT ? ?Result Date: 06/13/2021 ?CLINICAL DATA:  Fluoroscopic assistance for internal fixation of intertrochanteric frac

## 2021-05-21 NOTE — Plan of Care (Signed)
?  Problem: Education: ?Goal: Knowledge of General Education information will improve ?Description: Including pain rating scale, medication(s)/side effects and non-pharmacologic comfort measures ?Outcome: Progressing ?  ?Problem: Health Behavior/Discharge Planning: ?Goal: Ability to manage health-related needs will improve ?Outcome: Progressing ?  ?Problem: Clinical Measurements: ?Goal: Ability to maintain clinical measurements within normal limits will improve ?Outcome: Progressing ?Goal: Will remain free from infection ?Outcome: Progressing ?Goal: Diagnostic test results will improve ?Outcome: Progressing ?Goal: Respiratory complications will improve ?Outcome: Progressing ?Goal: Cardiovascular complication will be avoided ?Outcome: Progressing ?  ?Problem: Activity: ?Goal: Risk for activity intolerance will decrease ?Outcome: Progressing ?  ?Problem: Nutrition: ?Goal: Adequate nutrition will be maintained ?Outcome: Progressing ?  ?Problem: Coping: ?Goal: Level of anxiety will decrease ?Outcome: Progressing ?  ?Problem: Elimination: ?Goal: Will not experience complications related to bowel motility ?Outcome: Progressing ?Goal: Will not experience complications related to urinary retention ?Outcome: Progressing ?  ?Problem: Pain Managment: ?Goal: General experience of comfort will improve ?Outcome: Progressing ?  ?Problem: Safety: ?Goal: Ability to remain free from injury will improve ?Outcome: Progressing ?  ?Problem: Skin Integrity: ?Goal: Risk for impaired skin integrity will decrease ?Outcome: Progressing ?  ?Problem: Activity: ?Goal: Ability to avoid complications of mobility impairment will improve ?Outcome: Progressing ?Goal: Ability to tolerate increased activity will improve ?Outcome: Progressing ?  ?Problem: Clinical Measurements: ?Goal: Postoperative complications will be avoided or minimized ?Outcome: Progressing ?  ?Problem: Pain Management: ?Goal: Pain level will decrease with appropriate  interventions ?Outcome: Progressing ?  ?Problem: Skin Integrity: ?Goal: Will show signs of wound healing ?Outcome: Progressing ?  ?

## 2021-05-21 NOTE — TOC Progression Note (Addendum)
Transition of Care (TOC) - Progression Note  ? ? ?Patient Details  ?Name: [name] ?MRN: [id number] ?Date of Birth: [birth year] ? ?Transition of Care (TOC) CM/SW Contact  ?Conception Oms, RN ?Phone Number: ?05/21/2021, 3:02 PM ? ?Clinical Narrative:    ? ?Called DSS Guardian [name] Marrow  at [phone] left a Secure VM asking for a return call, Lenoard Aden Guardian Supervisor at [phone] and left a secure VM for a return call ? ? Passr requested, uploaded requested Information to [place] MUST, Sent Bedsearch FL2 completed ?  ? ?Expected Discharge Plan and Services ?  ?  ?  ?  ?  ?                ?  ?  ?  ?  ?  ?  ?  ?  ?  ?  ? ? ?Social Determinants of Health (SDOH) Interventions ?  ? ?Readmission Risk Interventions ?   ? View : No data to display.  ?  ?  ?  ? ? ?

## 2021-05-21 NOTE — Progress Notes (Signed)
The above named patient is recommended to go to [facility] for strengthening and gait training for balance.  It is expected that the Short Term Rehab stay will be less than 30 days.  The patient is expected to return home after Rehab.  ?

## 2021-05-21 NOTE — Progress Notes (Signed)
?Subjective: ? ?Patient reports pain as mild.   ? ?Objective:  ? ?VITALS:   ?Vitals:  ? 05/24/2021 1405 05/19/2021 1729 06/04/2021 1952 05/21/21 0356  ?BP: 125/60 132/73 (!) 107/91 (!) 109/56  ?Pulse: 84 94 96 86  ?Resp: 17 18 18 18   ?Temp: 98 ?F (36.7 ?C) 98.2 ?F (36.8 ?C) 98.6 ?F (37 ?C) 98.2 ?F (36.8 ?C)  ?TempSrc:      ?SpO2: 97% 100% 99% 94%  ?Weight:      ?Height:      ? ? ?PHYSICAL EXAM: ? ?Neurologically intact ?ABD soft ?Neurovascular intact ?Sensation intact distally ?Intact pulses distally ?Dorsiflexion/Plantar flexion intact ?Incision: dressing C/D/I ?No cellulitis present ?Compartment soft ? ?LABS ? ?Results for orders placed or performed during the hospital encounter of 05/21/2021 (from the past 24 hour(s))  ?Troponin I (High Sensitivity)     Status: None  ? Collection Time: 06/13/2021  7:26 AM  ?Result Value Ref Range  ? Troponin I (High Sensitivity) 5 <18 ng/L  ?Type and screen [place]     Status: None  ? Collection Time: 06/12/2021 12:03 PM  ?Result Value Ref Range  ? ABO/RH(D) O POS   ? Antibody Screen NEG   ? Sample Expiration    ?  05/23/2021,2359 ?Performed at [facility], [address] [place], [place] [address] ?  ?CBC     Status: Abnormal  ? Collection Time: 05/21/21  4:29 AM  ?Result Value Ref Range  ? WBC 19.1 (H) 4.0 - 10.5 K/uL  ? RBC 2.35 (L) 4.22 - 5.81 MIL/uL  ? Hemoglobin 7.4 (L) 13.0 - 17.0 g/dL  ? HCT 21.9 (L) 39.0 - 52.0 %  ? MCV 93.2 80.0 - 100.0 fL  ? MCH 31.5 26.0 - 34.0 pg  ? MCHC 33.8 30.0 - 36.0 g/dL  ? RDW 14.4 11.5 - 15.5 %  ? Platelets 132 (L) 150 - 400 K/uL  ? nRBC 0.0 0.0 - 0.2 %  ?Comprehensive metabolic panel     Status: Abnormal  ? Collection Time: 05/21/21  4:29 AM  ?Result Value Ref Range  ? Sodium 134 (L) 135 - 145 mmol/L  ? Potassium 4.0 3.5 - 5.1 mmol/L  ? Chloride 106 98 - 111 mmol/L  ? CO2 24 22 - 32 mmol/L  ? Glucose, Bld 119 (H) 70 - 99 mg/dL  ? BUN 24 (H) 8 - 23 mg/dL  ? Creatinine, Ser 1.12 0.61 - 1.24 mg/dL  ? Calcium 8.2 (L) 8.9  - 10.3 mg/dL  ? Total Protein 5.4 (L) 6.5 - 8.1 g/dL  ? Albumin 2.0 (L) 3.5 - 5.0 g/dL  ? AST 46 (H) 15 - 41 U/L  ? ALT 38 0 - 44 U/L  ? Alkaline Phosphatase 94 38 - 126 U/L  ? Total Bilirubin 0.8 0.3 - 1.2 mg/dL  ? GFR, Estimated >60 >60 mL/min  ? Anion gap 4 (L) 5 - 15  ? ? ?DG Chest Port 1 View ? ?Result Date: 05/24/2021 ?CLINICAL DATA:  Preop.  Left hip fracture EXAM: PORTABLE CHEST 1 VIEW COMPARISON:  06/26/2019 FINDINGS: Chronic lung disease with diffuse interstitial coarsening. Asymmetric hazy density at the right apex, site of treated lung cancer. Cluster of calcifications at the left lower lobe. Porta catheter on the right with tip at the upper right atrium. Normal heart size and mediastinal contours. IMPRESSION: 1. No acute finding. 2. Treated right lung cancer. Electronically Signed   By: Jorje Guild M.D.   On: 05/22/2021 05:54  ? ?  DG C-Arm 1-60 Min-No Report ? ?Result Date: 06/14/2021 ?Fluoroscopy was utilized by the requesting physician.  No radiographic interpretation.  ? ?DG HIP UNILAT WITH PELVIS 2-3 VIEWS LEFT ? ?Result Date: 05/19/2021 ?CLINICAL DATA:  Fluoroscopic assistance for internal fixation of intertrochanteric fracture of left femur EXAM: DG HIP (WITH OR WITHOUT PELVIS) 2-3V LEFT COMPARISON:  05/24/2021 FINDINGS: Fluoroscopic images show interval reduction and internal fixation of comminuted intertrochanteric fracture of proximal left femur with intramedullary rod. Fluoroscopic time was 29 seconds. IMPRESSION: Fluoroscopic assistance was provided for reduction and internal fixation of intertrochanteric fracture of proximal left femur. Electronically Signed   By: Elmer Picker M.D.   On: 06/10/2021 13:19  ? ?DG Hip Unilat With Pelvis 2-3 Views Left ? ?Result Date: 06/15/2021 ?CLINICAL DATA:  76 year old male with history of trauma from a fall complaining of left-sided hip pain. EXAM: DG HIP (WITH OR WITHOUT PELVIS) 2-3V LEFT COMPARISON:  No priors. FINDINGS: Three views of the bony pelvis  in the left hip demonstrate an acute mildly displaced comminuted intertrochanteric fracture of the left hip with approximately 25 degrees of varus angulation. The left femoral head remains located in the acetabulum. Other visualized portions of the bony pelvis in the right proximal femur appear intact. There is joint space narrowing, subchondral sclerosis, subchondral cyst formation and osteophyte formation in both hip joints, indicative of osteoarthritis. IMPRESSION: 1. Acute mildly displaced comminuted and angulated intratrochanteric fracture of the left hip, as above. 2. 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 Electronically Signed   By: Vinnie Langton M.D.   On: 06/08/2021 05:36   ? ?Assessment/Plan: ?1 Day Post-Op  ? ?Principal Problem: ?  Closed left hip fracture ([place]) ?Active Problems: ?  Dementia with behavioral disturbance ([place]) ?  BPH (benign prostatic hyperplasia) ?  Hypothyroidism ?  Obstructive chronic bronchitis without exacerbation ([place]) ?  Long QT interval ?  Hyponatremia ?  Anemia, unspecified ?  Elevated liver function tests ? ? ?Advance diet ?Up with therapy ?WBAT LLE ?Hydrocodone PRN pain rx in chart ?Lovenox for 2 weeks ending Jun 27, 2021 rx in chart ?Discharge when okay from medical standpoint ?Follow up in 2 weeks for staple removal ?Call office to confirm appointment [phone] ? ? ?Carlynn Spry , PA-C ?05/21/2021, 6:26 AM ? ? ? ? ?  ?

## 2021-05-21 NOTE — Discharge Instructions (Signed)

## 2021-05-21 NOTE — Evaluation (Signed)
Occupational Therapy Evaluation ?Patient Details ?Name: [name] ?MRN: [id number] ?DOB: September 30, 1945 ?Today's Date: 05/21/2021 ? ? ?History of Present Illness Pt is a 76 y.o. male with medical history significant for liver cirrhosis with history of alcohol abuse and hepatitis C, COPD, dementia with behavioral changes, BPH, dementia, hepatitis C, and throat and lung cancer, who presented to the ER with acute onset of left hip pain after having a mechanical fall.  MD assessment includes closed left hip fracture now s/p IM nail, long QT interval, hypothyroidism, hyponatremia, anemia, and elevated liver function tests.  ? ?Clinical Impression ?  ?Chart reivewed, pt greeted supine in bed requiring vcs for participation in OT evaluation. Pt is alert and oriented x 4, appears soft spoken and with fair one step directive following. Pt requires MAX A for supine<>sit at edge of bed, MAX A for UB dressing, TOTAL A for LB dressing. Pt unable to tolerate more than approx 7 minutes seated at edge of bed and requests to return to bed. Pt provided multi modal cueing for attempting STS, pt continues to refuse. Pt reports he was MOD I-I in all ADL/functional mobility PTA, had assist from IADL from group home. Pt is performing ADL/functional mobility below PLOF. Pt would benefit from STR to address functional deficits. OT will continue to follow while admitted.  ?   ? ?Recommendations for follow up therapy are one component of a multi-disciplinary discharge planning process, led by the attending physician.  Recommendations may be updated based on patient status, additional functional criteria and insurance authorization.  ? ?Follow Up Recommendations ? Skilled nursing-short term rehab (<3 hours/day)  ?  ?Assistance Recommended at Discharge Frequent or constant Supervision/Assistance  ?Patient can return home with the following A lot of help with walking and/or transfers;A lot of help with bathing/dressing/bathroom;Assistance with  cooking/housework;Direct supervision/assist for financial management;Help with stairs or ramp for entrance;Direct supervision/assist for medications management;Assist for transportation ? ?  ?Functional Status Assessment ? Patient has had a recent decline in their functional status and demonstrates the ability to make significant improvements in function in a reasonable and predictable amount of time.  ?Equipment Recommendations ? Other (comment) (per next venue of care)  ?  ?Recommendations for Other Services   ? ? ?  ?Precautions / Restrictions Precautions ?Precautions: Fall ?Restrictions ?Weight Bearing Restrictions: Yes ?LLE Weight Bearing: Weight bearing as tolerated  ? ?  ? ?Mobility Bed Mobility ?Overal bed mobility: Needs Assistance ?Bed Mobility: Supine to Sit, Sit to Supine ?  ?  ?Supine to sit: Max assist, HOB elevated ?Sit to supine: Max assist, HOB elevated ?  ?General bed mobility comments: pt able to assist with RUE on bed rail however MAX A to bring BLE over edge of bed ?  ? ?Transfers ?  ?  ?  ?  ?  ?  ?  ?  ?  ?  ?  ? ?  ?Balance Overall balance assessment: Needs assistance ?Sitting-balance support: Feet supported, Bilateral upper extremity supported ?Sitting balance-Leahy Scale: Fair ?  ?  ?  ?  ?  ?  ?  ?  ?  ?  ?  ?  ?  ?  ?  ?  ?   ? ?ADL either performed or assessed with clinical judgement  ? ?ADL Overall ADL's : Needs assistance/impaired ?  ?  ?Grooming: Wash/dry face;Sitting;Maximal assistance ?  ?  ?  ?  ?  ?Upper Body Dressing : Maximal assistance;Sitting ?Upper Body Dressing Details (indicate cue type and  reason): edge of bed ?Lower Body Dressing: Total assistance ?  ?  ?Toilet Transfer Details (indicate cue type and reason): unable to attempt on this date- pt declining multiple times to stand despite multi modal cueing ?  ?  ?  ?  ?  ?General ADL Comments: increased time for all ADL tasks, pt requires step by step multi modal cueing for participation  ? ? ? ?Vision Patient Visual Report:  No change from baseline ?   ?   ?Perception   ?  ?Praxis   ?  ? ?Pertinent Vitals/Pain Pain Assessment ?Pain Assessment: Faces ?Faces Pain Scale: Hurts even more ?Pain Descriptors / Indicators: Sore ?Pain Intervention(s): Limited activity within patient's tolerance, Monitored during session, Repositioned  ? ? ? ?Hand Dominance   ?  ?Extremity/Trunk Assessment Upper Extremity Assessment ?Upper Extremity Assessment: Generalized weakness ?  ?Lower Extremity Assessment ?Lower Extremity Assessment: Generalized weakness ?LLE Deficits / Details: s/p L hip IMN ?  ?  ?  ?Communication Communication ?Communication: No difficulties ?  ?Cognition Arousal/Alertness: Awake/alert ?Behavior During Therapy: [facility] for tasks assessed/performed ?Overall Cognitive Status: No family/caregiver present to determine baseline cognitive functioning ?  ?  ?  ?  ?  ?  ?  ?  ?  ?  ?  ?  ?  ?  ?  ?  ?General Comments: pt is oriented to self, place, situation and year. Fair one step direction following with verbal and tactile cues required. ?  ?  ?General Comments  Pt appears frail, fair-poor activity tolerance, presents with fear of falling ? ?  ?Exercises Other Exercises ?Other Exercises: edu re: role of OT, role of rehab, discharge recommendations, imporatnce of OOB mobility ?  ?Shoulder Instructions    ? ? ?Home Living Family/patient expects to be discharged to:: Group home ?  ?  ?  ?  ?  ?  ?  ?  ?  ?  ?  ?  ?  ?  ?  ?  ?Additional Comments: [place] home resident, 24/7 staff suprevision ?  ? ?  ?Prior Functioning/Environment Prior Level of Function : Independent/Modified Independent ?  ?  ?  ?  ?  ?  ?Mobility Comments: independent without AD in facility per chart review; history of falls ?ADLs Comments: mod I-I in ADL; staff assists with IADLs such as cleaning, cooking, med management ?  ? ?  ?  ?OT Problem List:   ?  ?   ?OT Treatment/Interventions: Self-care/ADL training;Therapeutic exercise;Modalities;Patient/family  education;Balance training;Therapeutic activities;DME and/or AE instruction  ?  ?OT Goals(Current goals can be found in the care plan section) Acute Rehab OT Goals ?Patient Stated Goal: return to bed ?OT Goal Formulation: With patient ?Time For Goal Achievement: 06/04/21 ?Potential to Achieve Goals: Good ?ADL Goals ?Pt Will Perform Grooming: sitting;with supervision ?Pt Will Perform Upper Body Dressing: with min assist;sitting ?Pt Will Perform Lower Body Dressing: with min assist;with adaptive equipment ?Pt Will Transfer to Toilet: with min assist;bedside commode;stand pivot transfer ?Pt Will Perform Toileting - Clothing Manipulation and hygiene: with min assist  ?OT Frequency:   ?  ? ?Co-evaluation   ?  ?  ?  ?  ? ?  ?AM-PAC OT "6 Clicks" Daily Activity     ?Outcome Measure Help from another person eating meals?: A Little ?Help from another person taking care of personal grooming?: A Lot ?Help from another person toileting, which includes using toliet, bedpan, or urinal?: Total ?Help from another person bathing (including washing, rinsing,  drying)?: A Lot ?Help from another person to put on and taking off regular upper body clothing?: A Lot ?Help from another person to put on and taking off regular lower body clothing?: Total ?6 Click Score: 11 ?  ?End of Session Nurse Communication: Mobility status ? ?Activity Tolerance: Patient limited by pain ?Patient left: in bed;with call bell/phone within reach;with bed alarm set ? ?OT Visit Diagnosis: History of falling (Z91.81);Muscle weakness (generalized) (M62.81)  ?              ?Time: 4166-0630 ?OT Time Calculation (min): 12 min ?Charges:  OT General Charges ?$OT Visit: 1 Visit ?OT Evaluation ?$OT Eval Low Complexity: 1 Low ? ?Shanon Payor, OTD OTR/L  ?05/21/21, 2:53 PM  ?

## 2021-05-21 NOTE — TOC Progression Note (Signed)
Transition of Care (TOC) - Progression Note  ? ? ?Patient Details  ?Name: [name] ?MRN: [id number] ?Date of Birth: 06-17-1945 ? ?Transition of Care (TOC) CM/SW Contact  ?Conception Oms, RN ?Phone Number: ?05/21/2021, 3:52 PM ? ?Clinical Narrative:   Terrilee Files [id number] E, expires 06/19/21 ? ? ? ?  ?  ? ?Expected Discharge Plan and Services ?  ?  ?  ?  ?  ?                ?  ?  ?  ?  ?  ?  ?  ?  ?  ?  ? ? ?Social Determinants of Health (SDOH) Interventions ?  ? ?Readmission Risk Interventions ?   ? View : No data to display.  ?  ?  ?  ? ? ?

## 2021-05-21 NOTE — NC FL2 (Signed)
?[place] MEDICAID FL2 LEVEL OF CARE SCREENING TOOL  ?  ? ?IDENTIFICATION  ?Patient Name: ?[name] Birthdate: 1946-02-17 Sex: male Admission Date (Current Location): ?05/31/2021  ?[place] and [place] Number: ? [place] ?  Facility and Address:  ?[facility], [address], [place], [place] [address] ?     Provider Number: ?[id number]  ?Attending Physician Name and Address:  ?Wyvonnia Dusky, MD ? Relative Name and Phone Number:  ?[place] [facility] [phone] ?   ?Current Level of Care: ?Hospital Recommended Level of Care: ?[place] Prior Approval Number: ?  ? ?Date Approved/Denied: ?  PASRR Number: ?pending ? ?Discharge Plan: ?SNF ?  ? ?Current Diagnoses: ?Patient Active Problem List  ? Diagnosis Date Noted  ? Malnutrition of moderate degree 05/21/2021  ? Closed left hip fracture ([place]) 06/09/2021  ? Hypothyroidism 05/17/2021  ? Obstructive chronic bronchitis without exacerbation ([place]) 05/19/2021  ? Long QT interval 05/26/2021  ? Hyponatremia 06/02/2021  ? Anemia, unspecified 05/18/2021  ? Elevated liver function tests 05/29/2021  ? Chronic liver disease and cirrhosis ([place])   ? Hypotension 06/27/2019  ? Alcoholic hepatitis 36/14/4315  ? Anxiety 04/20/2019  ? BPH (benign prostatic hyperplasia) 04/20/2019  ? HCV (hepatitis C virus) 04/20/2019  ? History of cocaine use 04/20/2019  ? History of fall 04/20/2019  ? History of noncompliance with medical treatment 04/20/2019  ? History of gastric ulcer 04/20/2019  ? Jaw fracture ([place]) 04/20/2019  ? Skin erosion 04/20/2019  ? Tobacco abuse 04/20/2019  ? Urinary retention 04/20/2019  ? Adenocarcinoma, lung, right ([place]) 03/17/2019  ? Near syncope   ? Encounter for hospice care discussion   ? Dehydration   ? Renal insufficiency   ? Protein-calorie malnutrition, severe 05/14/2016  ? Throat cancer ([place])   ? Palliative care encounter   ? AKI (acute kidney injury) ([place]) 05/13/2016  ? Goals of care, counseling/discussion  03/01/2016  ? 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 Alcohol abuse 11/18/2015  ? Dementia with behavioral disturbance ([place]) 10/21/2015  ? ? ?Orientation RESPIRATION BLADDER Height & Weight   ?  ?Self, Time, Situation, Place ? Normal Continent Weight: 55.6 kg ?Height:  5\' 6"  (167.6 cm)  ?BEHAVIORAL SYMPTOMS/MOOD NEUROLOGICAL BOWEL NUTRITION STATUS  ?    Continent Diet (see dc summary)  ?AMBULATORY STATUS COMMUNICATION OF NEEDS Skin   ?Extensive Assist Verbally Normal, Surgical wounds ?  ?  ?  ?    ?     ?     ? ? ?Personal Care Assistance Level of Assistance  ?Bathing, Feeding, Dressing Bathing Assistance: Limited assistance ?Feeding assistance: Limited assistance ?Dressing Assistance: Limited assistance ?   ? ?Functional Limitations Info  ?Hearing, Sight, Speech Sight Info: Adequate ?Hearing Info: Adequate ?Speech Info: Adequate  ? ? ?SPECIAL CARE FACTORS FREQUENCY  ?PT (By licensed PT), OT (By licensed OT)   ?  ?PT Frequency: 5 times per week ?OT Frequency: 5 times per week ?  ?  ?  ?   ? ? ?Contractures Contractures Info: Not present  ? ? ?Additional Factors Info  ?Code Status, Allergies Code Status Info: DNR ?Allergies Info: Penicillin ?  ?  ?  ?   ? ?Current Medications (05/21/2021):  This is the current hospital active medication list ?Current Facility-Administered Medications  ?Medication Dose Route Frequency Provider Last Rate Last Admin  ? 0.9 %  sodium chloride infusion   Intravenous Continuous Lovell Sheehan, MD   Stopped at 05/21/21 [phone]  ? acetaminophen (TYLENOL) suppository  650 mg  650 mg Rectal Q4H PRN Lovell Sheehan, MD      ? acetaminophen (TYLENOL) tablet 650 mg  650 mg Oral Q4H PRN Lovell Sheehan, MD      ? ascorbic acid (VITAMIN C) tablet 500 mg  500 mg Oral Daily Lovell Sheehan, MD   500 mg at 05/21/21 1041  ? bisacodyl (DULCOLAX) EC tablet 5 mg  5 mg Oral Daily PRN Lovell Sheehan, MD      ? buPROPion [facility] SR) 12 hr tablet 150 mg  150 mg Oral Daily Lovell Sheehan, MD   150 mg at  06/08/2021 2051  ? docusate sodium (COLACE) capsule 100 mg  100 mg Oral BID Lovell Sheehan, MD   100 mg at 05/21/21 1041  ? enoxaparin (LOVENOX) injection 40 mg  40 mg Subcutaneous Q24H Lovell Sheehan, MD   40 mg at 05/21/21 1041  ? feeding supplement (ENSURE ENLIVE / ENSURE PLUS) liquid 237 mL  237 mL Oral TID BM Loletha Grayer, MD   237 mL at 05/21/21 1446  ? ferrous sulfate tablet 325 mg  325 mg Oral Q breakfast Lovell Sheehan, MD      ? folic acid (FOLVITE) tablet 1 mg  1 mg Oral Daily Lovell Sheehan, MD   1 mg at 05/21/21 1041  ? HYDROcodone-acetaminophen (NORCO/VICODIN) 5-325 MG per tablet 1-2 tablet  1-2 tablet Oral Q6H PRN Lovell Sheehan, MD   1 tablet at 05/21/21 0554  ? hydrOXYzine (ATARAX) tablet 50 mg  50 mg Oral QHS Lovell Sheehan, MD   50 mg at 05/21/2021 2052  ? ipratropium-albuterol (DUONEB) 0.5-2.5 (3) MG/3ML nebulizer solution 3 mL  3 mL Nebulization Q4H PRN Lovell Sheehan, MD      ? levothyroxine (SYNTHROID) tablet 150 mcg  150 mcg Oral Q0600 Lovell Sheehan, MD   150 mcg at 05/21/21 0547  ? magnesium hydroxide (MILK OF MAGNESIA) suspension 30 mL  30 mL Oral Daily PRN Lovell Sheehan, MD      ? metoCLOPramide (REGLAN) tablet 5-10 mg  5-10 mg Oral Q8H PRN Lovell Sheehan, MD      ? Or  ? metoCLOPramide (REGLAN) injection 5-10 mg  5-10 mg Intravenous Q8H PRN Lovell Sheehan, MD      ? metoprolol succinate (TOPROL-XL) 24 hr tablet 12.5 mg  12.5 mg Oral QHS Loletha Grayer, MD   12.5 mg at 05/29/2021 2051  ? morphine (PF) 2 MG/ML injection 0.5 mg  0.5 mg Intravenous Q2H PRN Lovell Sheehan, MD      ? multivitamin with minerals tablet 1 tablet  1 tablet Oral Daily Loletha Grayer, MD   1 tablet at 05/21/21 1041  ? OLANZapine (ZYPREXA) tablet 15 mg  15 mg Oral QHS Lovell Sheehan, MD   15 mg at 05/21/2021 2051  ? OLANZapine (ZYPREXA) tablet 2.5 mg  2.5 mg Oral BID AC Lovell Sheehan, MD   2.5 mg at 05/21/21 1041  ? polyethylene glycol (MIRALAX / GLYCOLAX) packet 17 g  17 g Oral Daily PRN Lovell Sheehan, MD      ? spironolactone (ALDACTONE) tablet 50 mg  50 mg Oral Daily Lovell Sheehan, MD   50 mg at 05/21/21 1040  ? tamsulosin (FLOMAX) capsule 0.8 mg  0.8 mg Oral QPC breakfast Lovell Sheehan, MD   0.8 mg at 05/21/21 1040  ? thiamine tablet 100 mg  100 mg Oral Daily Harlow Mares,  Elyn Aquas, MD   100 mg at 05/21/21 1040  ? umeclidinium bromide (INCRUSE ELLIPTA) 62.5 MCG/ACT 1 puff  1 puff Inhalation Daily Lovell Sheehan, MD      ? Derrill Memo ON 05/22/2021] Vitamin D (Ergocalciferol) (DRISDOL) capsule 50,000 Units  50,000 Units Oral Q7 days Lovell Sheehan, MD      ? ?Facility-Administered Medications Ordered in Other Encounters  ?Medication Dose Route Frequency Provider Last Rate Last Admin  ? heparin lock flush 100 UNIT/ML injection           ? heparin lock flush 100 unit/mL  500 Units Intravenous Once Lloyd Huger, MD      ? ? ? ?Discharge Medications: ?Please see discharge summary for a list of discharge medications. ? ?Relevant Imaging Results: ? ?Relevant Lab Results: ? ? ?Additional Information ?[id number] ? ?Conception Oms, RN ? ? ? ? ?

## 2021-05-21 NOTE — Progress Notes (Signed)
Patient's sister Tandy Gaw would like to be updated by case manager on plan for the patient once discharged. ?Phone #: [phone] ?

## 2021-05-22 DIAGNOSIS — S72002S Fracture of unspecified part of neck of left femur, sequela: Secondary | ICD-10-CM | POA: Diagnosis not present

## 2021-05-22 DIAGNOSIS — E440 Moderate protein-calorie malnutrition: Secondary | ICD-10-CM

## 2021-05-22 DIAGNOSIS — F03918 Unspecified dementia, unspecified severity, with other behavioral disturbance: Secondary | ICD-10-CM | POA: Diagnosis not present

## 2021-05-22 LAB — COMPREHENSIVE METABOLIC PANEL
ALT: 26 U/L (ref 0–44)
AST: 44 U/L — ABNORMAL HIGH (ref 15–41)
Albumin: 1.9 g/dL — ABNORMAL LOW (ref 3.5–5.0)
Alkaline Phosphatase: 101 U/L (ref 38–126)
Anion gap: 3 — ABNORMAL LOW (ref 5–15)
BUN: 32 mg/dL — ABNORMAL HIGH (ref 8–23)
CO2: 25 mmol/L (ref 22–32)
Calcium: 8.3 mg/dL — ABNORMAL LOW (ref 8.9–10.3)
Chloride: 104 mmol/L (ref 98–111)
Creatinine, Ser: 0.95 mg/dL (ref 0.61–1.24)
GFR, Estimated: >60 mL/min (ref >60)
Glucose, Bld: 130 mg/dL — ABNORMAL HIGH (ref 70–99)
Potassium: 4.4 mmol/L (ref 3.5–5.1)
Sodium: 132 mmol/L — ABNORMAL LOW (ref 135–145)
Total Bilirubin: 0.8 mg/dL (ref 0.3–1.2)
Total Protein: 5.3 g/dL — ABNORMAL LOW (ref 6.5–8.1)

## 2021-05-22 LAB — FERRITIN: Ferritin: 209 ng/mL (ref 24–336)

## 2021-05-22 LAB — CBC
HCT: 22.3 % — ABNORMAL LOW (ref 39.0–52.0)
Hemoglobin: 7.5 g/dL — ABNORMAL LOW (ref 13.0–17.0)
MCH: 31.5 pg (ref 26.0–34.0)
MCHC: 33.6 g/dL (ref 30.0–36.0)
MCV: 93.7 fL (ref 80.0–100.0)
Platelets: 150 10*3/uL (ref 150–400)
RBC: 2.38 MIL/uL — ABNORMAL LOW (ref 4.22–5.81)
RDW: 14.5 % (ref 11.5–15.5)
WBC: 18 10*3/uL — ABNORMAL HIGH (ref 4.0–10.5)
nRBC: 0 % (ref 0.0–0.2)

## 2021-05-22 LAB — IRON AND TIBC
Iron: 49 ug/dL (ref 45–182)
Saturation Ratios: 26 % (ref 17.9–39.5)
TIBC: 186 ug/dL — ABNORMAL LOW (ref 250–450)
UIBC: 137 ug/dL

## 2021-05-22 NOTE — TOC Progression Note (Addendum)
Transition of Care (TOC) - Progression Note  ? ? ?Patient Details  ?Name: [name] ?MRN: [id number] ?Date of Birth: 01/12/1946 ? ?Transition of Care (TOC) CM/SW Contact  ?Conception Oms, RN ?Phone Number: ?05/22/2021, 10:08 AM ? ?Clinical Narrative:    ? ?Lenoard Aden the DSS Supervisor  [phone] left a secure VM asking for a call back ?[place] at [phone] and left a voice mail asking for a call back ? Called the main number for DSS and they transferred me to [facility] voice mail ?  ? ?Expected Discharge Plan and Services ?  ?  ?  ?  ?  ?                ?  ?  ?  ?  ?  ?  ?  ?  ?  ?  ? ? ?Social Determinants of Health (SDOH) Interventions ?  ? ?Readmission Risk Interventions ?   ? View : No data to display.  ?  ?  ?  ? ? ?

## 2021-05-22 NOTE — Progress Notes (Signed)
?  Subjective: ? ?Patient reports pain as mild.   ? ?Objective:  ? ?VITALS:   ?Vitals:  ? 05/21/21 1700 05/21/21 1926 05/21/21 2318 05/22/21 0306  ?BP: (!) 100/46 (!) 105/51 (!) 118/55 (!) 106/46  ?Pulse: 98 99 (!) 103 97  ?Resp: 18 14 15 14   ?Temp: 98.6 ?F (37 ?C) 98.2 ?F (36.8 ?C) 98 ?F (36.7 ?C) 98 ?F (36.7 ?C)  ?TempSrc:      ?SpO2: 92% 94% 93% 93%  ?Weight:      ?Height:      ? ? ?PHYSICAL EXAM: ? ?Neurologically intact ?ABD soft ?Neurovascular intact ?Sensation intact distally ?Intact pulses distally ?Dorsiflexion/Plantar flexion intact ?Incision: dressing C/D/I ?No cellulitis present ?Compartment soft ? ?LABS ? ?No results found for this or any previous visit (from the past 24 hour(s)). ? ?DG C-Arm 1-60 Min-No Report ? ?Result Date: 06/15/2021 ?Fluoroscopy was utilized by the requesting physician.  No radiographic interpretation.  ? ?DG HIP UNILAT WITH PELVIS 2-3 VIEWS LEFT ? ?Result Date: 05/25/2021 ?CLINICAL DATA:  Fluoroscopic assistance for internal fixation of intertrochanteric fracture of left femur EXAM: DG HIP (WITH OR WITHOUT PELVIS) 2-3V LEFT COMPARISON:  06/14/2021 FINDINGS: Fluoroscopic images show interval reduction and internal fixation of comminuted intertrochanteric fracture of proximal left femur with intramedullary rod. Fluoroscopic time was 29 seconds. IMPRESSION: Fluoroscopic assistance was provided for reduction and internal fixation of intertrochanteric fracture of proximal left femur. Electronically Signed   By: Elmer Picker M.D.   On: 05/27/2021 13:19   ? ?Assessment/Plan: ?2 Days Post-Op  ? ?Principal Problem: ?  Closed left hip fracture ([place]) ?Active Problems: ?  Dementia with behavioral disturbance ([place]) ?  BPH (benign prostatic hyperplasia) ?  Hypothyroidism ?  Obstructive chronic bronchitis without exacerbation ([place]) ?  Long QT interval ?  Hyponatremia ?  Anemia, unspecified ?  Elevated liver function tests ?  Malnutrition of moderate degree ? ? ?Advance diet ?Up with  therapy ?WBAT LLE ?Hydrocodone PRN pain rx in chart ?Lovenox for 2 weeks ending 15-Jun-2021 rx in chart ?Discharge when okay from medical standpoint ?Follow up in 2 weeks for staple removal ?Call office to confirm appointment [phone] ? ?Carlynn Spry , PA-C ?05/22/2021, 6:29 AM ? ? ? ? ?  ?

## 2021-05-22 NOTE — Progress Notes (Addendum)
7416 ?Nurse and PT assisted pt to recliner at this time. Call bell and possessions within reach  ? ?1230 ?Nurse and nurse tech assisted pt back to bed. Pt states he would like to rest now. Callbell and possessions within reach ? ?1300 ?Pt sister Mardene Celeste if needed for contact [phone]) ? ?Shift summary ?Plan for pt to d/c to SNF when appropriate ? ?

## 2021-05-22 NOTE — Progress Notes (Addendum)
?PROGRESS NOTE ? ? ? ?[name]  PFX:[id number] DOB: 1945/03/28 DOA: 06/10/2021 ?PCP: Remi Haggard, FNP ? ?Assessment & Plan: ?  ?Principal Problem: ?  Closed left hip fracture ([place]) ?Active Problems: ?  Long QT interval ?  Obstructive chronic bronchitis without exacerbation ([place]) ?  Hypothyroidism ?  Dementia with behavioral disturbance ([place]) ?  BPH (benign prostatic hyperplasia) ?  Hyponatremia ?  Anemia, unspecified ?  Elevated liver function tests ?  Malnutrition of moderate degree ? ? ?Closed left hip fracture: s/p left intramedullary nail intertrochanteric as per ortho surg on 06/12/2021. PT/OT recs SNF but have been unable to reach pt's legal guardian so far  ?  ?Long QT interval: hold seroquel, will continue to monitor  ?  ?Chronic bronchitis: without exacerbation. Continue on bronchodilators  ?  ?Hypothyroidism: continue on levothyroxine  ?  ?BPH: continue on flomax ?  ?Dementia: with behavioral disturbance. Continue w/ olanzapine which was changed from seroquel secondary to QT prolongation  ? ?Transaminitis: ALT is WNL but AST is still elevated but trending down  ? ?Thrombocytopenia: resolved  ?  ?Normocytic anemia: H&H are labile. Will transfuse if Hb < 7.0 ?  ?Hyponatremia: labile. Will continue to monitor ? ?Moderate protein-calorie malnutrition: BMI 19.7. Continue on nutritional supplements   ? ? ?DVT prophylaxis: lovenox  ?Code Status: DNR  ?Family Communication: called pt's legal guardian but no answer  ?Disposition Plan:  likely d/c to SNF. Unable to reach pt's legal guardian so far ? ?Level of care: Med-Surg ?Status is: Inpatient ?Remains inpatient appropriate because: needs SNF placement but unable to reach pt's legal guardian so far  ? ? ? ?Consultants:  ?Ortho surg  ? ?Procedures:  ? ?Antimicrobials:  ? ? ?Subjective: ?Pt c/o being cold  ? ?Objective: ?Vitals:  ? 05/21/21 1926 05/21/21 2318 05/22/21 0306 05/22/21 0753  ?BP: (!) 105/51 (!) 118/55 (!) 106/46 101/62  ?Pulse: 99 (!) 103 97 84   ?Resp: 14 15 14 16   ?Temp: 98.2 ?F (36.8 ?C) 98 ?F (36.7 ?C) 98 ?F (36.7 ?C) (!) 97.3 ?F (36.3 ?C)  ?TempSrc:    Oral  ?SpO2: 94% 93% 93% 98%  ?Weight:      ?Height:      ? ? ?Intake/Output Summary (Last 24 hours) at 05/22/2021 0805 ?Last data filed at 05/21/2021 1715 ?Gross per 24 hour  ?Intake 560 ml  ?Output 300 ml  ?Net 260 ml  ? ?Filed Weights  ? 06/05/2021 0541 06/04/2021 1142  ?Weight: 55.6 kg 55.6 kg  ? ? ?Examination: ? ?General exam: Appears calm but uncomfortable. Frail appearing  ?Respiratory system: clear breath sounds b/l  ?Cardiovascular system: S1/S2+. No rubs or gallops  ?Gastrointestinal system: Abd is soft, NT, ND & hypoactive bowel sounds ?Central nervous system: alert and awake. Moves all extremities  ?Psychiatry: Judgement and insight appears poor. Flat mood and affect ? ? ? ?Data Reviewed: I have personally reviewed following labs and imaging studies ? ?CBC: ?Recent Labs  ?Lab 06/10/2021 ?3299 05/21/21 ?2426 05/22/21 ?0602  ?WBC 11.4* 19.1* 18.0*  ?NEUTROABS 9.1*  --   --   ?HGB 9.7* 7.4* 7.5*  ?HCT 28.9* 21.9* 22.3*  ?MCV 93.5 93.2 93.7  ?PLT 157 132* 150  ? ?Basic Metabolic Panel: ?Recent Labs  ?Lab 05/30/2021 ?8341 05/21/21 ?9622 05/22/21 ?0602  ?NA 132* 134* 132*  ?K 3.9 4.0 4.4  ?CL 100 106 104  ?CO2 25 24 25   ?GLUCOSE 127* 119* 130*  ?BUN 22 24* 32*  ?CREATININE 1.16 1.12 0.95  ?  CALCIUM 8.8* 8.2* 8.3*  ? ?GFR: ?Estimated Creatinine Clearance: 50.4 mL/min (by C-G formula based on SCr of 0.95 mg/dL). ?Liver Function Tests: ?Recent Labs  ?Lab 05/24/2021 ?5009 05/21/21 ?3818 05/22/21 ?0602  ?AST 63* 46* 44*  ?ALT 59* 38 26  ?ALKPHOS 105 94 101  ?BILITOT 1.5* 0.8 0.8  ?PROT 6.8 5.4* 5.3*  ?ALBUMIN 2.7* 2.0* 1.9*  ? ?No results for input(s): LIPASE, AMYLASE in the last 168 hours. ?No results for input(s): AMMONIA in the last 168 hours. ?Coagulation Profile: ?Recent Labs  ?Lab 06/15/2021 ?2993  ?INR 1.4*  ? ?Cardiac Enzymes: ?No results for input(s): CKTOTAL, CKMB, CKMBINDEX, TROPONINI in the last 168  hours. ?BNP (last 3 results) ?No results for input(s): PROBNP in the last 8760 hours. ?HbA1C: ?No results for input(s): HGBA1C in the last 72 hours. ?CBG: ?No results for input(s): GLUCAP in the last 168 hours. ?Lipid Profile: ?No results for input(s): CHOL, HDL, LDLCALC, TRIG, CHOLHDL, LDLDIRECT in the last 72 hours. ?Thyroid Function Tests: ?No results for input(s): TSH, T4TOTAL, FREET4, T3FREE, THYROIDAB in the last 72 hours. ?Anemia Panel: ?Recent Labs  ?  05/22/21 ?0602  ?FERRITIN 209  ?TIBC 186*  ?IRON 49  ? ?Sepsis Labs: ?No results for input(s): PROCALCITON, LATICACIDVEN in the last 168 hours. ? ?Recent Results (from the past 240 hour(s))  ?Resp Panel by RT-PCR (Flu A&B, Covid) Nasopharyngeal Swab     Status: None  ? Collection Time: 05/19/2021  5:57 AM  ? Specimen: Nasopharyngeal Swab; Nasopharyngeal(NP) swabs in vial transport medium  ?Result Value Ref Range Status  ? SARS Coronavirus 2 by RT PCR NEGATIVE NEGATIVE Final  ?  Comment: (NOTE) ?SARS-CoV-2 target nucleic acids are NOT DETECTED. ? ?The SARS-CoV-2 RNA is generally detectable in upper respiratory ?specimens during the acute phase of infection. The lowest ?concentration of SARS-CoV-2 viral copies this assay can detect is ?138 copies/mL. A negative result does not preclude SARS-Cov-2 ?infection and should not be used as the sole basis for treatment or ?other patient management decisions. A negative result may occur with  ?improper specimen collection/handling, submission of specimen other ?than nasopharyngeal swab, presence of viral mutation(s) within the ?areas targeted by this assay, and inadequate number of viral ?copies(<138 copies/mL). A negative result must be combined with ?clinical observations, patient history, and epidemiological ?information. The expected result is Negative. ? ?Fact Sheet for Patients:  ?EntrepreneurPulse.com.au ? ?Fact Sheet for Healthcare Providers:  ?IncredibleEmployment.be ? ?This test  is no t yet approved or cleared by the [place] FDA and  ?has been authorized for detection and/or diagnosis of SARS-CoV-2 by ?FDA under an Emergency Use Authorization (EUA). This EUA will remain  ?in effect (meaning this test can be used) for the duration of the ?COVID-19 declaration under Section 564(b)(1) of the Act, 21 ?U.S.C.section 360bbb-3(b)(1), unless the authorization is terminated  ?or revoked sooner.  ? ? ?  ? Influenza A by PCR NEGATIVE NEGATIVE Final  ? Influenza B by PCR NEGATIVE NEGATIVE Final  ?  Comment: (NOTE) ?The Xpert Xpress SARS-CoV-2/FLU/RSV plus assay is intended as an aid ?in the diagnosis of influenza from Nasopharyngeal swab specimens and ?should not be used as a sole basis for treatment. Nasal washings and ?aspirates are unacceptable for Xpert Xpress SARS-CoV-2/FLU/RSV ?testing. ? ?Fact Sheet for Patients: ?EntrepreneurPulse.com.au ? ?Fact Sheet for Healthcare Providers: ?IncredibleEmployment.be ? ?This test is not yet approved or cleared by the [place] FDA and ?has been authorized for detection and/or diagnosis of SARS-CoV-2 by ?FDA under an Emergency Use  Authorization (EUA). This EUA will remain ?in effect (meaning this test can be used) for the duration of the ?COVID-19 declaration under Section 564(b)(1) of the Act, 21 U.S.C. ?section 360bbb-3(b)(1), unless the authorization is terminated or ?revoked. ? ?Performed at [facility], [place], ?[place] [address] ?  ?  ? ? ? ? ? ?Radiology Studies: ?DG C-Arm 1-60 Min-No Report ? ?Result Date: 05/25/2021 ?Fluoroscopy was utilized by the requesting physician.  No radiographic interpretation.  ? ?DG HIP UNILAT WITH PELVIS 2-3 VIEWS LEFT ? ?Result Date: 06/11/2021 ?CLINICAL DATA:  Fluoroscopic assistance for internal fixation of intertrochanteric fracture of left femur EXAM: DG HIP (WITH OR WITHOUT PELVIS) 2-3V LEFT COMPARISON:  05/19/2021 FINDINGS: Fluoroscopic images show  interval reduction and internal fixation of comminuted intertrochanteric fracture of proximal left femur with intramedullary rod. Fluoroscopic time was 29 seconds. IMPRESSION: Fluoroscopic assistance was provide

## 2021-05-22 NOTE — Progress Notes (Signed)
Physical Therapy Treatment ?Patient Details ?Name: Davinder [name] ?MRN: [id number] ?DOB: 1945/06/27 ?Today's Date: 05/22/2021 ? ? ?History of Present Illness Pt is a 76 y.o. male with medical history significant for liver cirrhosis with history of alcohol abuse and hepatitis C, COPD, dementia with behavioral changes, BPH, dementia, hepatitis C, and throat and lung cancer, who presented to the ER with acute onset of left hip pain after having a mechanical fall.  MD assessment includes closed left hip fracture now s/p IM nail, long QT interval, hypothyroidism, hyponatremia, anemia, and elevated liver function tests. ? ?  ?PT Comments  ? ? Patient tolerated session fairly and was agreeable to treatment. Patient's pain continues to be a limiting factor in patient's participation in therapy session. He continues to require Max A +2 to complete supine to sit bed mobility. Patient attempted to move LLE to EOB however was unable to complete. Sit to stand from elevated EOB required Mod A 2 with cueing on hand placement. X2 sit to stands were completed as patient is unable to tolerate standing for longer periods of time.  Stand pivot transfer was utilized to transfer patient to recliner with Mod A +2. Patient attempted to take a step with R and LLE, however was unable to complete. Patient was left in recliner with all needs met. Patient is progressing towards his goals and would continue to benefit from skilled physical therapy in order to optimize patient's independence and safety with ADLs. Continue to recommend STR upon discharge from acute hospitalization.  ?  ?Recommendations for follow up therapy are one component of a multi-disciplinary discharge planning process, led by the attending physician.  Recommendations may be updated based on patient status, additional functional criteria and insurance authorization. ? ?Follow Up Recommendations ? Skilled nursing-short term rehab (<3 hours/day) ?  ?  ?Assistance Recommended at  Discharge Frequent or constant Supervision/Assistance  ?Patient can return home with the following Two people to help with walking and/or transfers;Two people to help with bathing/dressing/bathroom;Direct supervision/assist for medications management;Assistance with cooking/housework;Assist for transportation ?  ?Equipment Recommendations ? Rolling walker (2 wheels)  ?  ?Recommendations for Other Services   ? ? ?  ?Precautions / Restrictions Precautions ?Precautions: Fall ?Restrictions ?Weight Bearing Restrictions: Yes ?LLE Weight Bearing: Weight bearing as tolerated  ?  ? ?Mobility ? Bed Mobility ?Overal bed mobility: Needs Assistance ?Bed Mobility: Supine to Sit ?  ?  ?Supine to sit: Max assist, +2 for physical assistance, HOB elevated ?  ?  ?  ?  ? ?Transfers ?Overall transfer level: Needs assistance ?Equipment used: Rolling walker (2 wheels) ?Transfers: Sit to/from Stand, Bed to chair/wheelchair/BSC ?Sit to Stand: Mod assist, +2 physical assistance, From elevated surface ?Stand pivot transfers: Mod assist, +2 physical assistance (patient unable to take step after multiple attempts, significant posterior lean) ?  ?  ?  ?  ?General transfer comment: cueing for proper hand placement ?  ? ?Ambulation/Gait ?Ambulation/Gait assistance:  (patient unable to complete at this time) ?  ?  ?  ?  ?  ?  ?General Gait Details: Unable to advance either LE ? ? ?Stairs ?  ?  ?  ?  ?  ? ? ?Wheelchair Mobility ?  ? ?Modified Rankin (Stroke Patients Only) ?  ? ? ?  ?Balance Overall balance assessment: Needs assistance ?Sitting-balance support: Feet supported, Bilateral upper extremity supported ?Sitting balance-Leahy Scale: Fair ?  ?  ?Standing balance support: Bilateral upper extremity supported, During functional activity, Reliant on assistive device for balance ?Standing  balance-Leahy Scale: Poor ?Standing balance comment: posterior lean in standing, patient unable to get hips under body to maintain upright position ?  ?  ?  ?  ?   ?  ?  ?  ?  ?  ?  ?  ? ?  ?Cognition Arousal/Alertness: Awake/alert ?Behavior During Therapy: [facility] for tasks assessed/performed ?Overall Cognitive Status: No family/caregiver present to determine baseline cognitive functioning ?  ?  ?  ?  ?  ?  ?  ?  ?  ?  ?  ?  ?  ?  ?  ?  ?General Comments: pt is oriented to self, place, situation. Fair one step direction following with verbal and tactile cues required. ?  ?  ? ?  ?Exercises   ? ?  ?General Comments General comments (skin integrity, edema, etc.): Patient very frail ?  ?  ? ?Pertinent Vitals/Pain Pain Assessment ?Pain Assessment: No/denies pain ?Faces Pain Scale: Hurts even more ?Pain Location: L hip ?Pain Descriptors / Indicators: Sore ?Pain Intervention(s): Limited activity within patient's tolerance, Monitored during session, Repositioned  ? ? ?Home Living   ?  ?  ?  ?  ?  ?  ?  ?  ?  ?   ?  ?Prior Function    ?  ?  ?   ? ?PT Goals (current goals can now be found in the care plan section) Acute Rehab PT Goals ?Patient Stated Goal: To get back to walking ?PT Goal Formulation: With patient ?Time For Goal Achievement: 06/24/21 ?Potential to Achieve Goals: Fair ?Progress towards PT goals: Progressing toward goals ? ?  ?Frequency ? ? ? 7X/week ? ? ? ?  ?PT Plan Current plan remains appropriate  ? ? ?Co-evaluation   ?  ?  ?  ?  ? ?  ?AM-PAC PT "6 Clicks" Mobility   ?Outcome Measure ? Help needed turning from your back to your side while in a flat bed without using bedrails?: Total ?Help needed moving from lying on your back to sitting on the side of a flat bed without using bedrails?: Total ?Help needed moving to and from a bed to a chair (including a wheelchair)?: A Lot ?Help needed standing up from a chair using your arms (e.g., wheelchair or bedside chair)?: A Lot ?Help needed to walk in hospital room?: Total ?Help needed climbing 3-5 steps with a railing? : Total ?6 Click Score: 8 ? ?  ?End of Session Equipment Utilized During Treatment: Gait belt ?Activity  Tolerance: Patient limited by pain ?Patient left: in chair;with call bell/phone within reach;with chair alarm set ?Nurse Communication: Mobility status ?PT Visit Diagnosis: Unsteadiness on feet (R26.81);History of falling (Z91.81);Difficulty in walking, not elsewhere classified (R26.2);Muscle weakness (generalized) (M62.81);Pain ?Pain - Right/Left: Left ?Pain - part of body: Hip ?  ? ? ?Time: 0355-9741 ?PT Time Calculation (min) (ACUTE ONLY): 23 min ? ?Charges:  $Therapeutic Activity: 23-37 mins          ?          ? ?Iva Boop, PT  ?05/22/21. 10:33 AM ? ? ?

## 2021-05-23 DIAGNOSIS — F03918 Unspecified dementia, unspecified severity, with other behavioral disturbance: Secondary | ICD-10-CM | POA: Diagnosis not present

## 2021-05-23 DIAGNOSIS — E440 Moderate protein-calorie malnutrition: Secondary | ICD-10-CM | POA: Diagnosis not present

## 2021-05-23 DIAGNOSIS — S72002S Fracture of unspecified part of neck of left femur, sequela: Secondary | ICD-10-CM | POA: Diagnosis not present

## 2021-05-23 LAB — COMPREHENSIVE METABOLIC PANEL
ALT: 27 U/L (ref 0–44)
AST: 45 U/L — ABNORMAL HIGH (ref 15–41)
Albumin: 2 g/dL — ABNORMAL LOW (ref 3.5–5.0)
Alkaline Phosphatase: 97 U/L (ref 38–126)
Anion gap: 5 (ref 5–15)
BUN: 35 mg/dL — ABNORMAL HIGH (ref 8–23)
CO2: 26 mmol/L (ref 22–32)
Calcium: 8.5 mg/dL — ABNORMAL LOW (ref 8.9–10.3)
Chloride: 101 mmol/L (ref 98–111)
Creatinine, Ser: 1.05 mg/dL (ref 0.61–1.24)
GFR, Estimated: >60 mL/min (ref >60)
Glucose, Bld: 111 mg/dL — ABNORMAL HIGH (ref 70–99)
Potassium: 4.5 mmol/L (ref 3.5–5.1)
Sodium: 132 mmol/L — ABNORMAL LOW (ref 135–145)
Total Bilirubin: 1 mg/dL (ref 0.3–1.2)
Total Protein: 5.6 g/dL — ABNORMAL LOW (ref 6.5–8.1)

## 2021-05-23 LAB — CBC
HCT: 23.4 % — ABNORMAL LOW (ref 39.0–52.0)
Hemoglobin: 7.7 g/dL — ABNORMAL LOW (ref 13.0–17.0)
MCH: 31.7 pg (ref 26.0–34.0)
MCHC: 32.9 g/dL (ref 30.0–36.0)
MCV: 96.3 fL (ref 80.0–100.0)
Platelets: 173 10*3/uL (ref 150–400)
RBC: 2.43 MIL/uL — ABNORMAL LOW (ref 4.22–5.81)
RDW: 14.5 % (ref 11.5–15.5)
WBC: 14.6 10*3/uL — ABNORMAL HIGH (ref 4.0–10.5)
nRBC: 0 % (ref 0.0–0.2)

## 2021-05-23 MED ORDER — HALOPERIDOL LACTATE 5 MG/ML IJ SOLN
2.0000 mg | Freq: Three times a day (TID) | INTRAMUSCULAR | Status: DC | PRN
  Administered 2021-05-23 – 2021-05-28 (×4): 2 mg via INTRAMUSCULAR
  Filled 2021-05-23 (×4): qty 1

## 2021-05-23 NOTE — Progress Notes (Signed)
Occupational Therapy Treatment ?Patient Details ?Name: [name] ?MRN: [id number] ?DOB: [birth year] ?Today's Date: 05/23/2021 ? ? ?History of present illness Pt is a 76 y.o. male with medical history significant for liver cirrhosis with history of alcohol abuse and hepatitis C, COPD, dementia with behavioral changes, BPH, dementia, hepatitis C, and throat and lung cancer, who presented to the ER with acute onset of left hip pain after having a mechanical fall.  MD assessment includes closed left hip fracture now s/p IM nail, long QT interval, hypothyroidism, hyponatremia, anemia, and elevated liver function tests. ?  ?OT comments ? Pt seen for brief OT tx this date limited by pt's fatigue. Pt up in recliner after working with PT and nursing to get from the bed to the recliner. Pt sleeping upon OT's arrival, wakes with gentle verbal cues. Pt oriented x3 and able to follow simple commands with cues and intermittent verbal cues to improve alertness to task. Pt noted with significant amount of debris underneath fingernails (per PTA, may be feces). With cue to initiate, pt placed fingers into a warm soapy water basin to soak. Pt ultimately required MAX A for washing his hands and fingernails. Pt visually showing signs of discomfort at times with attempts to clean out underneath nails. Pt activity tolerance limited this date. Will continue to benefit from skilled OT services. Continue to recommend SNF.   ? ?Recommendations for follow up therapy are one component of a multi-disciplinary discharge planning process, led by the attending physician.  Recommendations may be updated based on patient status, additional functional criteria and insurance authorization. ?   ?Follow Up Recommendations ? Skilled nursing-short term rehab (<3 hours/day)  ?  ?Assistance Recommended at Discharge Frequent or constant Supervision/Assistance  ?Patient can return home with the following ? A lot of help with bathing/dressing/bathroom;Assistance  with cooking/housework;Direct supervision/assist for financial management;Help with stairs or ramp for entrance;Direct supervision/assist for medications management;Assist for transportation;Two people to help with walking and/or transfers ?  ?Equipment Recommendations ? Other (comment) (per next venue)  ?  ?Recommendations for Other Services   ? ?  ?Precautions / Restrictions Precautions ?Precautions: Fall ?Restrictions ?Weight Bearing Restrictions: Yes ?LLE Weight Bearing: Weight bearing as tolerated  ? ? ?  ? ?Mobility Bed Mobility ?  ?  ?  ?  ?  ?  ?  ?General bed mobility comments: NT, up in recliner ?  ? ?Transfers ?  ?  ?  ?  ?  ?  ?  ?  ?  ?General transfer comment: deferred, just to recliner with PT/RN just prior to session and fatigued from effort ?  ?  ?Balance Overall balance assessment: Needs assistance ?Sitting-balance support: Bilateral upper extremity supported, Feet supported ?Sitting balance-Leahy Scale: Fair ?  ?  ?  ?  ?  ?  ?  ?  ?  ?  ?  ?  ?  ?  ?  ?  ?   ? ?ADL either performed or assessed with clinical judgement  ? ?ADL Overall ADL's : Needs assistance/impaired ?  ?  ?Grooming: Sitting;Wash/dry hands;Maximal assistance ?Grooming Details (indicate cue type and reason): decreased arousal, requiring cues to improve participation, wouldn't hold washcloths himself but would put hands in warm water basin with cue to initiate ?  ?  ?  ?  ?  ?  ?  ?  ?  ?  ?  ?  ?  ?  ?  ?  ?  ? ?Extremity/Trunk Assessment   ?  ?  ?  ?  ?  ? ?  Vision   ?  ?  ?Perception   ?  ?Praxis   ?  ? ?Cognition Arousal/Alertness: Lethargic ?Behavior During Therapy: [facility] for tasks assessed/performed ?Overall Cognitive Status: No family/caregiver present to determine baseline cognitive functioning ?  ?  ?  ?  ?  ?  ?  ?  ?  ?  ?  ?  ?  ?  ?  ?  ?General Comments: Oriented to self, place, situation, cues to follow 1 step directions ?  ?  ?   ?Exercises   ? ?  ?Shoulder Instructions   ? ? ?  ?General Comments    ? ? ?Pertinent  Vitals/ Pain       Pain Assessment ?Pain Assessment: Faces ?Faces Pain Scale: Hurts a little bit ?Pain Location: underneath fingernails with attempts to clean them ?Pain Descriptors / Indicators: Grimacing, Guarding ?Pain Intervention(s): Monitored during session, Repositioned ? ?Home Living   ?  ?  ?  ?  ?  ?  ?  ?  ?  ?  ?  ?  ?  ?  ?  ?  ?  ?  ? ?  ?Prior Functioning/Environment    ?  ?  ?  ?   ? ?Frequency ? Min 2X/week  ? ? ? ? ?  ?Progress Toward Goals ? ?OT Goals(current goals can now be found in the care plan section) ? Progress towards OT goals: Progressing toward goals ? ?Acute Rehab OT Goals ?Patient Stated Goal: return to bed ?OT Goal Formulation: With patient ?Time For Goal Achievement: 06/04/21 ?Potential to Achieve Goals: Good  ?Plan Discharge plan remains appropriate;Frequency remains appropriate   ? ?Co-evaluation ? ? ?   ?  ?  ?  ?  ? ?  ?AM-PAC OT "6 Clicks" Daily Activity     ?Outcome Measure ? ? Help from another person eating meals?: A Little ?Help from another person taking care of personal grooming?: A Lot ?Help from another person toileting, which includes using toliet, bedpan, or urinal?: Total ?Help from another person bathing (including washing, rinsing, drying)?: A Lot ?Help from another person to put on and taking off regular upper body clothing?: A Lot ?Help from another person to put on and taking off regular lower body clothing?: Total ?6 Click Score: 11 ? ?  ?End of Session   ? ?OT Visit Diagnosis: History of falling (Z91.81);Muscle weakness (generalized) (M62.81) ?  ?Activity Tolerance Patient limited by fatigue;Patient limited by pain ?  ?Patient Left in chair;with call bell/phone within reach;with chair alarm set;with restraints reapplied (mitts on hands at start and end of session) ?  ?Nurse Communication   ?  ? ?   ? ?Time: 1005-1015 ?OT Time Calculation (min): 10 min ? ?Charges: OT General Charges ?$OT Visit: 1 Visit ?OT Treatments ?$Self Care/Home Management : 8-22  mins ? ?Ardeth Perfect., MPH, MS, OTR/L ?ascom [phone] ?05/23/21, 10:42 AM ? ?

## 2021-05-23 NOTE — Plan of Care (Signed)
  Problem: Pain Managment: Goal: General experience of comfort will improve Outcome: Progressing   Problem: Safety: Goal: Ability to remain free from injury will improve Outcome: Progressing   

## 2021-05-23 NOTE — Progress Notes (Signed)
Pooped 4-5x, small to moderate amount, Brownish without solid pieces, no abdominal pain. MD Made Aware.  ?

## 2021-05-23 NOTE — Progress Notes (Addendum)
?PROGRESS NOTE ? ? ? ?[name]  ZOX:[id number] DOB: 1946/01/25 DOA: 05/28/2021 ?PCP: Remi Haggard, FNP ? ?Assessment & Plan: ?  ?Principal Problem: ?  Closed left hip fracture ([place]) ?Active Problems: ?  Long QT interval ?  Obstructive chronic bronchitis without exacerbation ([place]) ?  Hypothyroidism ?  Dementia with behavioral disturbance ([place]) ?  BPH (benign prostatic hyperplasia) ?  Hyponatremia ?  Anemia, unspecified ?  Elevated liver function tests ?  Malnutrition of moderate degree ? ? ?Closed left hip fracture: s/p left intramedullary nail intertrochanteric as per ortho surg on 05/23/2021. PT/OT recs SNF but have been unable to reach pt's legal guardian still  ?  ?Long QT interval: hold seroquel, will continue to monitor  ?  ?Chronic bronchitis: without exacerbation. Continue on bronchodilators  ?  ?Hypothyroidism: continue on levothyroxine  ? ?BPH: continue on flomax  ?  ?Dementia: with behavioral disturbance. Continue w/ olanzapine which was changed from seroquel secondary to QT prolongation  ? ?Transaminitis: AST is labile. ALT is WNL  ? ?Thrombocytopenia: resolved  ?  ?Normocytic anemia: w/ likely component of acute blood loss anemia from recent surgery. H&H are trending up. Will transfuse if Hb < 7.0  ?  ?Hyponatremia: stable  ? ?Moderate protein-calorie malnutrition: BMI 19.7. Continue on nutritional supplements  ? ? ?DVT prophylaxis: lovenox  ?Code Status: DNR  ?Family Communication: called pt's legal guardian but no answer  ?Disposition Plan:  likely d/c to SNF. Unable to reach pt's legal guardian so far ? ?Level of care: Med-Surg ?Status is: Inpatient ?Remains inpatient appropriate because: needs SNF placement but unable to reach pt's legal guardian so far  ? ? ? ?Consultants:  ?Ortho surg  ? ?Procedures:  ? ?Antimicrobials:  ? ? ?Subjective: ?Pt is very confused today  ? ?Objective: ?Vitals:  ? 05/22/21 1515 05/22/21 1958 05/23/21 0020 05/23/21 0522  ?BP: (!) 114/51 114/63 (!) 119/59 119/75  ?Pulse:  89 98 91 90  ?Resp: 18 17 16 16   ?Temp: 98.2 ?F (36.8 ?C) 99.3 ?F (37.4 ?C) 98.7 ?F (37.1 ?C) 98.1 ?F (36.7 ?C)  ?TempSrc:      ?SpO2: 93% 94% 95% 98%  ?Weight:      ?Height:      ? ? ?Intake/Output Summary (Last 24 hours) at 05/23/2021 0655 ?Last data filed at 05/22/2021 1616 ?Gross per 24 hour  ?Intake 480 ml  ?Output 350 ml  ?Net 130 ml  ? ?Filed Weights  ? 06/09/2021 0541 05/18/2021 1142  ?Weight: 55.6 kg 55.6 kg  ? ? ?Examination: ? ?General exam: Appears confused and restless  ?Respiratory system: clear breath sounds b/l ?Cardiovascular system: S1 & S2+. No rubs or clicks  ?Gastrointestinal system: abd is soft, NT, ND & hypoactive bowel sounds  ?Central nervous system: Alert and awake. Moves all extremities  ?Psychiatry: Judgement and insight appears poor. Flat mood and affect ? ? ? ?Data Reviewed: I have personally reviewed following labs and imaging studies ? ?CBC: ?Recent Labs  ?Lab 05/22/2021 ?8119 05/21/21 ?1478 05/22/21 ?0602 05/23/21 ?2956  ?WBC 11.4* 19.1* 18.0* 14.6*  ?NEUTROABS 9.1*  --   --   --   ?HGB 9.7* 7.4* 7.5* 7.7*  ?HCT 28.9* 21.9* 22.3* 23.4*  ?MCV 93.5 93.2 93.7 96.3  ?PLT 157 132* 150 173  ? ?Basic Metabolic Panel: ?Recent Labs  ?Lab 06/07/2021 ?2130 05/21/21 ?8657 05/22/21 ?0602 05/23/21 ?8469  ?NA 132* 134* 132* 132*  ?K 3.9 4.0 4.4 4.5  ?CL 100 106 104 101  ?CO2 25 24  25 26  ?GLUCOSE 127* 119* 130* 111*  ?BUN 22 24* 32* 35*  ?CREATININE 1.16 1.12 0.95 1.05  ?CALCIUM 8.8* 8.2* 8.3* 8.5*  ? ?GFR: ?Estimated Creatinine Clearance: 45.6 mL/min (by C-G formula based on SCr of 1.05 mg/dL). ?Liver Function Tests: ?Recent Labs  ?Lab 06/06/2021 ?9326 05/21/21 ?7124 05/22/21 ?0602 05/23/21 ?5809  ?AST 63* 46* 44* 45*  ?ALT 59* 38 26 27  ?ALKPHOS 105 94 101 97  ?BILITOT 1.5* 0.8 0.8 1.0  ?PROT 6.8 5.4* 5.3* 5.6*  ?ALBUMIN 2.7* 2.0* 1.9* 2.0*  ? ?No results for input(s): LIPASE, AMYLASE in the last 168 hours. ?No results for input(s): AMMONIA in the last 168 hours. ?Coagulation Profile: ?Recent Labs  ?Lab  06/15/2021 ?9833  ?INR 1.4*  ? ?Cardiac Enzymes: ?No results for input(s): CKTOTAL, CKMB, CKMBINDEX, TROPONINI in the last 168 hours. ?BNP (last 3 results) ?No results for input(s): PROBNP in the last 8760 hours. ?HbA1C: ?No results for input(s): HGBA1C in the last 72 hours. ?CBG: ?No results for input(s): GLUCAP in the last 168 hours. ?Lipid Profile: ?No results for input(s): CHOL, HDL, LDLCALC, TRIG, CHOLHDL, LDLDIRECT in the last 72 hours. ?Thyroid Function Tests: ?No results for input(s): TSH, T4TOTAL, FREET4, T3FREE, THYROIDAB in the last 72 hours. ?Anemia Panel: ?Recent Labs  ?  05/22/21 ?0602  ?FERRITIN 209  ?TIBC 186*  ?IRON 49  ? ?Sepsis Labs: ?No results for input(s): PROCALCITON, LATICACIDVEN in the last 168 hours. ? ?Recent Results (from the past 240 hour(s))  ?Resp Panel by RT-PCR (Flu A&B, Covid) Nasopharyngeal Swab     Status: None  ? Collection Time: 06/11/2021  5:57 AM  ? Specimen: Nasopharyngeal Swab; Nasopharyngeal(NP) swabs in vial transport medium  ?Result Value Ref Range Status  ? SARS Coronavirus 2 by RT PCR NEGATIVE NEGATIVE Final  ?  Comment: (NOTE) ?SARS-CoV-2 target nucleic acids are NOT DETECTED. ? ?The SARS-CoV-2 RNA is generally detectable in upper respiratory ?specimens during the acute phase of infection. The lowest ?concentration of SARS-CoV-2 viral copies this assay can detect is ?138 copies/mL. A negative result does not preclude SARS-Cov-2 ?infection and should not be used as the sole basis for treatment or ?other patient management decisions. A negative result may occur with  ?improper specimen collection/handling, submission of specimen other ?than nasopharyngeal swab, presence of viral mutation(s) within the ?areas targeted by this assay, and inadequate number of viral ?copies(<138 copies/mL). A negative result must be combined with ?clinical observations, patient history, and epidemiological ?information. The expected result is Negative. ? ?Fact Sheet for Patients:   ?EntrepreneurPulse.com.au ? ?Fact Sheet for Healthcare Providers:  ?IncredibleEmployment.be ? ?This test is no t yet approved or cleared by the [place] FDA and  ?has been authorized for detection and/or diagnosis of SARS-CoV-2 by ?FDA under an Emergency Use Authorization (EUA). This EUA will remain  ?in effect (meaning this test can be used) for the duration of the ?COVID-19 declaration under Section 564(b)(1) of the Act, 21 ?U.S.C.section 360bbb-3(b)(1), unless the authorization is terminated  ?or revoked sooner.  ? ? ?  ? Influenza A by PCR NEGATIVE NEGATIVE Final  ? Influenza B by PCR NEGATIVE NEGATIVE Final  ?  Comment: (NOTE) ?The Xpert Xpress SARS-CoV-2/FLU/RSV plus assay is intended as an aid ?in the diagnosis of influenza from Nasopharyngeal swab specimens and ?should not be used as a sole basis for treatment. Nasal washings and ?aspirates are unacceptable for Xpert Xpress SARS-CoV-2/FLU/RSV ?testing. ? ?Fact Sheet for Patients: ?EntrepreneurPulse.com.au ? ?Fact Sheet for Healthcare Providers: ?IncredibleEmployment.be ? ?  This test is not yet approved or cleared by the [place] FDA and ?has been authorized for detection and/or diagnosis of SARS-CoV-2 by ?FDA under an Emergency Use Authorization (EUA). This EUA will remain ?in effect (meaning this test can be used) for the duration of the ?COVID-19 declaration under Section 564(b)(1) of the Act, 21 U.S.C. ?section 360bbb-3(b)(1), unless the authorization is terminated or ?revoked. ? ?Performed at [facility], [place], ?[place] [address] ?  ?  ? ? ? ? ? ?Radiology Studies: ?No results found. ? ? ? ? ? ?Scheduled Meds: ? vitamin C  500 mg Oral Daily  ? buPROPion  150 mg Oral Daily  ? docusate sodium  100 mg Oral BID  ? enoxaparin (LOVENOX) injection  40 mg Subcutaneous Q24H  ? feeding supplement  237 mL Oral TID BM  ? ferrous sulfate  325 mg Oral Q  breakfast  ? folic acid  1 mg Oral Daily  ? hydrOXYzine  50 mg Oral QHS  ? levothyroxine  150 mcg Oral Q0600  ? metoprolol succinate  12.5 mg Oral QHS  ? multivitamin with minerals  1 tablet Oral Daily  ? OLANZapine  15 mg [place]

## 2021-05-23 NOTE — Progress Notes (Signed)
Physical Therapy Treatment ?Patient Details ?Name: [name] ?MRN: [id number] ?DOB: 10/29/1945 ?Today's Date: 05/23/2021 ? ? ?History of Present Illness Pt is a 76 y.o. male with medical history significant for liver cirrhosis with history of alcohol abuse and hepatitis C, COPD, dementia with behavioral changes, BPH, dementia, hepatitis C, and throat and lung cancer, who presented to the ER with acute onset of left hip pain after having a mechanical fall.  MD assessment includes closed left hip fracture now s/p IM nail, long QT interval, hypothyroidism, hyponatremia, anemia, and elevated liver function tests. ? ?  ?PT Comments  ? ? Pt in bed with nurse in room.  Pt inc of large BM and had removed ext cath in need of full bath.  Care provided with assist of nurse and pt encouraged to participate as much as possible with care.  To EOB with max a x 2.  Stood with mod a x 2 for pericare and continued bathing.  After seated rest, he is able to transfer to chair at bedside with mod/max a x 1 and physical assist to move LLE to allow for transfer.  Seated AAROM x 1.  Remained in chair after session with nurse in room.   ?Discussed with OT need for nail care due due to [facility] under fingernails and she will assist with care during her session.   ?  ?Recommendations for follow up therapy are one component of a multi-disciplinary discharge planning process, led by the attending physician.  Recommendations may be updated based on patient status, additional functional criteria and insurance authorization. ? ?Follow Up Recommendations ? Skilled nursing-short term rehab (<3 hours/day) ?  ?  ?Assistance Recommended at Discharge Frequent or constant Supervision/Assistance  ?Patient can return home with the following Two people to help with walking and/or transfers;Two people to help with bathing/dressing/bathroom;Direct supervision/assist for medications management;Assistance with cooking/housework;Assist for transportation ?  ?Equipment  Recommendations ? Rolling walker (2 wheels)  ?  ?Recommendations for Other Services   ? ? ?  ?Precautions / Restrictions Precautions ?Precautions: Fall ?Restrictions ?Weight Bearing Restrictions: Yes ?LLE Weight Bearing: Weight bearing as tolerated  ?  ? ?Mobility ? Bed Mobility ?Overal bed mobility: Needs Assistance ?  ?  ?  ?Supine to sit: Mod assist, Max assist, +2 for physical assistance ?  ?  ?  ?  ? ?Transfers ?Overall transfer level: Needs assistance ?Equipment used: Rolling walker (2 wheels) ?Transfers: Sit to/from Stand ?Sit to Stand: Mod assist, +2 physical assistance, From elevated surface ?Stand pivot transfers: Mod assist, +2 physical assistance ?  ?  ?  ?  ?General transfer comment: assist to manually move LLE to allow for transfer ?  ? ?Ambulation/Gait ?  ?  ?  ?  ?  ?  ?  ?General Gait Details: Unable to advance either LE ? ? ?Stairs ?  ?  ?  ?  ?  ? ? ?Wheelchair Mobility ?  ? ?Modified Rankin (Stroke Patients Only) ?  ? ? ?  ?Balance Overall balance assessment: Needs assistance ?Sitting-balance support: Bilateral upper extremity supported, Feet supported ?Sitting balance-Leahy Scale: Fair ?  ?  ?Standing balance support: Bilateral upper extremity supported, During functional activity, Reliant on assistive device for balance ?Standing balance-Leahy Scale: Poor ?Standing balance comment: posterior lean in standing, patient unable to get hips under body to maintain upright position ?  ?  ?  ?  ?  ?  ?  ?  ?  ?  ?  ?  ? ?  ?  Cognition Arousal/Alertness: Awake/alert ?Behavior During Therapy: [facility] for tasks assessed/performed ?Overall Cognitive Status: No family/caregiver present to determine baseline cognitive functioning ?  ?  ?  ?  ?  ?  ?  ?  ?  ?  ?  ?  ?  ?  ?  ?  ?  ?  ?  ? ?  ?Exercises Other Exercises ?Other Exercises: seated AAROM x 10 ?Other Exercises: assited with bathing with nurse due to inc BM and urine  - had removed ext cath ? ?  ?General Comments   ?  ?  ? ?Pertinent Vitals/Pain Pain  Assessment ?Pain Assessment: Faces ?Faces Pain Scale: Hurts even more  ? ? ?Home Living   ?  ?  ?  ?  ?  ?  ?  ?  ?  ?   ?  ?Prior Function    ?  ?  ?   ? ?PT Goals (current goals can now be found in the care plan section) Progress towards PT goals: Progressing toward goals ? ?  ?Frequency ? ? ? 7X/week ? ? ? ?  ?PT Plan Current plan remains appropriate  ? ? ?Co-evaluation   ?  ?  ?  ?  ? ?  ?AM-PAC PT "6 Clicks" Mobility   ?Outcome Measure ? Help needed turning from your back to your side while in a flat bed without using bedrails?: Total ?Help needed moving from lying on your back to sitting on the side of a flat bed without using bedrails?: Total ?Help needed moving to and from a bed to a chair (including a wheelchair)?: A Lot ?Help needed standing up from a chair using your arms (e.g., wheelchair or bedside chair)?: A Lot ?Help needed to walk in hospital room?: Total ?Help needed climbing 3-5 steps with a railing? : Total ?6 Click Score: 8 ? ?  ?End of Session Equipment Utilized During Treatment: Gait belt ?Activity Tolerance: Patient tolerated treatment well ?Patient left: in chair;with call bell/phone within reach;with chair alarm set ?Nurse Communication: Mobility status ?PT Visit Diagnosis: Unsteadiness on feet (R26.81);History of falling (Z91.81);Difficulty in walking, not elsewhere classified (R26.2);Muscle weakness (generalized) (M62.81);Pain ?Pain - Right/Left: Left ?Pain - part of body: Hip ?  ? ? ?Time: 0920-0950 ?PT Time Calculation (min) (ACUTE ONLY): 30 min ? ?Charges:  $Therapeutic Exercise: 8-22 mins ?$Therapeutic Activity: 8-22 mins          ?         Chesley Noon, PTA ?05/23/21, 10:57 AM ? ?

## 2021-05-23 NOTE — Progress Notes (Signed)
Patient removed IV and external catheter. Educated patient on importance of not removing medical equipment. Patient refuses replacement of IV at this time stating "I don't want to be touched". Pain medication given prn for pain. Bed alarm active and audible. Call bell within reach,  ?

## 2021-05-24 DIAGNOSIS — F03918 Unspecified dementia, unspecified severity, with other behavioral disturbance: Secondary | ICD-10-CM | POA: Diagnosis not present

## 2021-05-24 DIAGNOSIS — S72002S Fracture of unspecified part of neck of left femur, sequela: Secondary | ICD-10-CM | POA: Diagnosis not present

## 2021-05-24 DIAGNOSIS — D62 Acute posthemorrhagic anemia: Secondary | ICD-10-CM

## 2021-05-24 LAB — COMPREHENSIVE METABOLIC PANEL
ALT: 25 U/L (ref 0–44)
AST: 43 U/L — ABNORMAL HIGH (ref 15–41)
Albumin: 1.8 g/dL — ABNORMAL LOW (ref 3.5–5.0)
Alkaline Phosphatase: 81 U/L (ref 38–126)
Anion gap: 4 — ABNORMAL LOW (ref 5–15)
BUN: 36 mg/dL — ABNORMAL HIGH (ref 8–23)
CO2: 24 mmol/L (ref 22–32)
Calcium: 8.7 mg/dL — ABNORMAL LOW (ref 8.9–10.3)
Chloride: 103 mmol/L (ref 98–111)
Creatinine, Ser: 0.96 mg/dL (ref 0.61–1.24)
GFR, Estimated: >60 mL/min (ref >60)
Glucose, Bld: 94 mg/dL (ref 70–99)
Potassium: 4.8 mmol/L (ref 3.5–5.1)
Sodium: 131 mmol/L — ABNORMAL LOW (ref 135–145)
Total Bilirubin: 1.3 mg/dL — ABNORMAL HIGH (ref 0.3–1.2)
Total Protein: 5.4 g/dL — ABNORMAL LOW (ref 6.5–8.1)

## 2021-05-24 LAB — CBC
HCT: 20.3 % — ABNORMAL LOW (ref 39.0–52.0)
Hemoglobin: 6.9 g/dL — ABNORMAL LOW (ref 13.0–17.0)
MCH: 32.5 pg (ref 26.0–34.0)
MCHC: 34 g/dL (ref 30.0–36.0)
MCV: 95.8 fL (ref 80.0–100.0)
Platelets: 161 10*3/uL (ref 150–400)
RBC: 2.12 MIL/uL — ABNORMAL LOW (ref 4.22–5.81)
RDW: 15 % (ref 11.5–15.5)
WBC: 11.8 10*3/uL — ABNORMAL HIGH (ref 4.0–10.5)
nRBC: 0.2 % (ref 0.0–0.2)

## 2021-05-24 LAB — HEMOGLOBIN AND HEMATOCRIT, BLOOD
HCT: 29.2 % — ABNORMAL LOW (ref 39.0–52.0)
HCT: 26.4 % — ABNORMAL LOW (ref 39.0–52.0)
Hemoglobin: 9.9 g/dL — ABNORMAL LOW (ref 13.0–17.0)
Hemoglobin: 8.9 g/dL — ABNORMAL LOW (ref 13.0–17.0)

## 2021-05-24 LAB — PREPARE RBC (CROSSMATCH)

## 2021-05-24 MED ORDER — SODIUM CHLORIDE 0.9% IV SOLUTION: Freq: Once | INTRAVENOUS | Status: DC

## 2021-05-24 NOTE — Progress Notes (Signed)
Spoke with Bethanne Ginger, on call DSS worker. Consent for blood transfusion now in chart. ?

## 2021-05-24 NOTE — Progress Notes (Addendum)
?PROGRESS NOTE ? ? ? ?[name]  NLG:[id number] DOB: [birth year] DOA: 06/07/2021 ?PCP: Remi Haggard, FNP ? ?Assessment & Plan: ?  ?Principal Problem: ?  Closed left hip fracture ([place]) ?Active Problems: ?  Long QT interval ?  Obstructive chronic bronchitis without exacerbation ([place]) ?  Hypothyroidism ?  Dementia with behavioral disturbance ([place]) ?  BPH (benign prostatic hyperplasia) ?  Hyponatremia ?  Anemia, unspecified ?  Elevated liver function tests ?  Malnutrition of moderate degree ? ? ?Closed left hip fracture: s/p left intramedullary nail intertrochanteric as per ortho surg on 05/18/2021. PT/OT recs SNF but CM still unable to reach pt's guardian  ?  ?Long QT interval: hold seroquel  ?  ?Chronic bronchitis: w/o exacerbation. Continue w/ bronchodilators  ?  ?Hypothyroidism: continue on levothyroxine  ? ?BPH: continue on flomax  ?  ?Dementia: with behavioral disturbance. Continue w/ olanzapine which was changed from seroquel secondary to QT prolongation  ? ?Transaminitis: ALT is WNL and AST is almost WNL  ? ?Thrombocytopenia: resolved  ?  ?Normocytic anemia: w/ likely component of acute blood loss anemia from recent surg. Hb 6.9 today. Will give 1 unit of pRBCs and repeat H&H 4 hours post transfusion  ?  ?Hyponatremia: stable  ? ?Moderate protein-calorie malnutrition: BMI 19.7. Continue on nutritional supplements  ? ? ?DVT prophylaxis: lovenox  ?Code Status: DNR  ?Family Communication:  ?Disposition Plan:  likely d/c to SNF. Unable to reach pt's legal guardian so far ? ?Level of care: Med-Surg ?Status is: Inpatient ?Remains inpatient appropriate because: needs SNF placement but unable to reach pt's legal guardian so far  ? ? ? ?Consultants:  ?Ortho surg  ? ?Procedures:  ? ?Antimicrobials:  ? ? ?Subjective: ?Pt denies any complaints  ? ?Objective: ?Vitals:  ? 05/23/21 2052 05/24/21 0510 05/24/21 0510 05/24/21 0814  ?BP: 101/66 (!) 117/56 (!) 117/56 (!) 106/50  ?Pulse: 88 99 92 90  ?Resp: 14 18 18 16   ?Temp: 98  ?F (36.7 ?C) 99.5 ?F (37.5 ?C) 99.5 ?F (37.5 ?C) 98.7 ?F (37.1 ?C)  ?TempSrc:      ?SpO2: 96% 94% 95% 94%  ?Weight:      ?Height:      ? ? ?Intake/Output Summary (Last 24 hours) at 05/24/2021 0757 ?Last data filed at 05/23/2021 1830 ?Gross per 24 hour  ?Intake 0 ml  ?Output --  ?Net 0 ml  ? ?Filed Weights  ? 05/24/2021 0541 05/29/2021 1142  ?Weight: 55.6 kg 55.6 kg  ? ? ?Examination: ? ?General exam: Appears calm & comfortable. Frail appearing   ?Respiratory system: clear breath sounds b/l  ?Cardiovascular system: S1 &S2+. No rubs or clicks   ?Gastrointestinal system: Abd is soft, NT, ND & normal bowel sounds ?Central nervous system: Alert and awake. Moves all extremities  ?Psychiatry: judgement and insight appears poor. Flat mood and affect ? ? ? ?Data Reviewed: I have personally reviewed following labs and imaging studies ? ?CBC: ?Recent Labs  ?Lab 06/08/2021 ?4818 05/21/21 ?5631 05/22/21 ?0602 05/23/21 ?4970 05/24/21 ?2637  ?WBC 11.4* 19.1* 18.0* 14.6* 11.8*  ?NEUTROABS 9.1*  --   --   --   --   ?HGB 9.7* 7.4* 7.5* 7.7* 6.9*  ?HCT 28.9* 21.9* 22.3* 23.4* 20.3*  ?MCV 93.5 93.2 93.7 96.3 95.8  ?PLT 157 132* 150 173 161  ? ?Basic Metabolic Panel: ?Recent Labs  ?Lab 06/01/2021 ?8588 05/21/21 ?5027 05/22/21 ?0602 05/23/21 ?7412 05/24/21 ?8786  ?NA 132* 134* 132* 132* 131*  ?K 3.9 4.0 4.4 4.5  4.8  ?CL 100 106 104 101 103  ?CO2 25 24 25 26 24   ?GLUCOSE 127* 119* 130* 111* 94  ?BUN 22 24* 32* 35* 36*  ?CREATININE 1.16 1.12 0.95 1.05 0.96  ?CALCIUM 8.8* 8.2* 8.3* 8.5* 8.7*  ? ?GFR: ?Estimated Creatinine Clearance: 49.9 mL/min (by C-G formula based on SCr of 0.96 mg/dL). ?Liver Function Tests: ?Recent Labs  ?Lab 05/31/2021 ?8182 05/21/21 ?9937 05/22/21 ?0602 05/23/21 ?1696 05/24/21 ?7893  ?AST 63* 46* 44* 45* 43*  ?ALT 59* 38 26 27 25   ?ALKPHOS 105 94 101 97 81  ?BILITOT 1.5* 0.8 0.8 1.0 1.3*  ?PROT 6.8 5.4* 5.3* 5.6* 5.4*  ?ALBUMIN 2.7* 2.0* 1.9* 2.0* 1.8*  ? ?No results for input(s): LIPASE, AMYLASE in the last 168 hours. ?No results  for input(s): AMMONIA in the last 168 hours. ?Coagulation Profile: ?Recent Labs  ?Lab 05/24/2021 ?8101  ?INR 1.4*  ? ?Cardiac Enzymes: ?No results for input(s): CKTOTAL, CKMB, CKMBINDEX, TROPONINI in the last 168 hours. ?BNP (last 3 results) ?No results for input(s): PROBNP in the last 8760 hours. ?HbA1C: ?No results for input(s): HGBA1C in the last 72 hours. ?CBG: ?No results for input(s): GLUCAP in the last 168 hours. ?Lipid Profile: ?No results for input(s): CHOL, HDL, LDLCALC, TRIG, CHOLHDL, LDLDIRECT in the last 72 hours. ?Thyroid Function Tests: ?No results for input(s): TSH, T4TOTAL, FREET4, T3FREE, THYROIDAB in the last 72 hours. ?Anemia Panel: ?Recent Labs  ?  05/22/21 ?0602  ?FERRITIN 209  ?TIBC 186*  ?IRON 49  ? ?Sepsis Labs: ?No results for input(s): PROCALCITON, LATICACIDVEN in the last 168 hours. ? ?Recent Results (from the past 240 hour(s))  ?Resp Panel by RT-PCR (Flu A&B, Covid) Nasopharyngeal Swab     Status: None  ? Collection Time: 06/12/2021  5:57 AM  ? Specimen: Nasopharyngeal Swab; Nasopharyngeal(NP) swabs in vial transport medium  ?Result Value Ref Range Status  ? SARS Coronavirus 2 by RT PCR NEGATIVE NEGATIVE Final  ?  Comment: (NOTE) ?SARS-CoV-2 target nucleic acids are NOT DETECTED. ? ?The SARS-CoV-2 RNA is generally detectable in upper respiratory ?specimens during the acute phase of infection. The lowest ?concentration of SARS-CoV-2 viral copies this assay can detect is ?138 copies/mL. A negative result does not preclude SARS-Cov-2 ?infection and should not be used as the sole basis for treatment or ?other patient management decisions. A negative result may occur with  ?improper specimen collection/handling, submission of specimen other ?than nasopharyngeal swab, presence of viral mutation(s) within the ?areas targeted by this assay, and inadequate number of viral ?copies(<138 copies/mL). A negative result must be combined with ?clinical observations, patient history, and  epidemiological ?information. The expected result is Negative. ? ?Fact Sheet for Patients:  ?EntrepreneurPulse.com.au ? ?Fact Sheet for Healthcare Providers:  ?IncredibleEmployment.be ? ?This test is no t yet approved or cleared by the [place] FDA and  ?has been authorized for detection and/or diagnosis of SARS-CoV-2 by ?FDA under an Emergency Use Authorization (EUA). This EUA will remain  ?in effect (meaning this test can be used) for the duration of the ?COVID-19 declaration under Section 564(b)(1) of the Act, 21 ?U.S.C.section 360bbb-3(b)(1), unless the authorization is terminated  ?or revoked sooner.  ? ? ?  ? Influenza A by PCR NEGATIVE NEGATIVE Final  ? Influenza B by PCR NEGATIVE NEGATIVE Final  ?  Comment: (NOTE) ?The Xpert Xpress SARS-CoV-2/FLU/RSV plus assay is intended as an aid ?in the diagnosis of influenza from Nasopharyngeal swab specimens and ?should not be used as a sole basis for  treatment. Nasal washings and ?aspirates are unacceptable for Xpert Xpress SARS-CoV-2/FLU/RSV ?testing. ? ?Fact Sheet for Patients: ?EntrepreneurPulse.com.au ? ?Fact Sheet for Healthcare Providers: ?IncredibleEmployment.be ? ?This test is not yet approved or cleared by the [place] FDA and ?has been authorized for detection and/or diagnosis of SARS-CoV-2 by ?FDA under an Emergency Use Authorization (EUA). This EUA will remain ?in effect (meaning this test can be used) for the duration of the ?COVID-19 declaration under Section 564(b)(1) of the Act, 21 U.S.C. ?section 360bbb-3(b)(1), unless the authorization is terminated or ?revoked. ? ?Performed at [facility], [place], ?[place] [address] ?  ?  ? ? ? ? ? ?Radiology Studies: ?No results found. ? ? ? ? ? ?Scheduled Meds: ? sodium chloride   Intravenous Once  ? vitamin C  500 mg Oral Daily  ? buPROPion  150 mg Oral Daily  ? docusate sodium  100 mg Oral BID  ?  enoxaparin (LOVENOX) injection  40 mg Subcutaneous Q24H  ? feeding supplement  237 mL Oral TID BM  ? ferrous sulfate  325 mg Oral Q breakfast  ? folic acid  1 mg Oral Daily  ? hydrOXYzine  50 mg Oral QHS  ? levothyroxine  150 mcg

## 2021-05-24 NOTE — Plan of Care (Signed)
?  Problem: Education: ?Goal: Knowledge of General Education information will improve ?Description: Including pain rating scale, medication(s)/side effects and non-pharmacologic comfort measures ?Outcome: Progressing ?  ?Problem: Health Behavior/Discharge Planning: ?Goal: Ability to manage health-related needs will improve ?Outcome: Progressing ?  ?Problem: Clinical Measurements: ?Goal: Ability to maintain clinical measurements within normal limits will improve ?Outcome: Progressing ?Goal: Will remain free from infection ?Outcome: Progressing ?Goal: Diagnostic test results will improve ?Outcome: Progressing ?Goal: Respiratory complications will improve ?Outcome: Progressing ?Goal: Cardiovascular complication will be avoided ?Outcome: Progressing ?  ?Problem: Activity: ?Goal: Risk for activity intolerance will decrease ?Outcome: Progressing ?  ?Problem: Coping: ?Goal: Level of anxiety will decrease ?Outcome: Progressing ?  ?Problem: Elimination: ?Goal: Will not experience complications related to bowel motility ?Outcome: Progressing ?Goal: Will not experience complications related to urinary retention ?Outcome: Progressing ?  ?Problem: Pain Managment: ?Goal: General experience of comfort will improve ?Outcome: Progressing ?  ?Problem: Safety: ?Goal: Ability to remain free from injury will improve ?Outcome: Progressing ?  ?Problem: Skin Integrity: ?Goal: Risk for impaired skin integrity will decrease ?Outcome: Progressing ?  ?Problem: Education: ?Goal: Knowledge of the prescribed therapeutic regimen will improve ?Outcome: Progressing ?Goal: Understanding of discharge needs will improve ?Outcome: Progressing ?Goal: Individualized Educational Video(s) ?Outcome: Progressing ?  ?Problem: Activity: ?Goal: Ability to avoid complications of mobility impairment will improve ?Outcome: Progressing ?  ?Problem: Clinical Measurements: ?Goal: Postoperative complications will be avoided or minimized ?Outcome: Progressing ?   ?Problem: Pain Management: ?Goal: Pain level will decrease with appropriate interventions ?Outcome: Progressing ?  ?Problem: Skin Integrity: ?Goal: Will show signs of wound healing ?Outcome: Progressing ?  ?

## 2021-05-24 NOTE — Progress Notes (Signed)
PT Cancellation Note ? ?Patient Details ?Name: [name] ?MRN: [id number] ?DOB: 10-08-1945 ? ? ?Cancelled Treatment:     Pt will be receiving blood transfusion this date. PT will return afterwards and continue to follow per current POC.  ? ? ?Willette Pa ?05/24/2021, 9:19 AM ?

## 2021-05-24 NOTE — Progress Notes (Signed)
Attempted to call legal guardian to obtain consent for blood transfusion. Weekend on-call social worker supposed to give a call back when available. Will continue to attempt as appropriate. ?

## 2021-05-25 ENCOUNTER — Inpatient Hospital Stay: Payer: Medicare Other

## 2021-05-25 DIAGNOSIS — S72002S Fracture of unspecified part of neck of left femur, sequela: Secondary | ICD-10-CM | POA: Diagnosis not present

## 2021-05-25 DIAGNOSIS — F03918 Unspecified dementia, unspecified severity, with other behavioral disturbance: Secondary | ICD-10-CM | POA: Diagnosis not present

## 2021-05-25 DIAGNOSIS — R1319 Other dysphagia: Secondary | ICD-10-CM | POA: Diagnosis not present

## 2021-05-25 LAB — TYPE AND SCREEN
ABO/RH(D): O POS
Antibody Screen: NEGATIVE
Unit division: 0

## 2021-05-25 LAB — BPAM RBC
Blood Product Expiration Date: 202305082359
ISSUE DATE / TIME: 202304081148
Unit Type and Rh: 5100

## 2021-05-25 LAB — COMPREHENSIVE METABOLIC PANEL
ALT: 26 U/L (ref 0–44)
AST: 44 U/L — ABNORMAL HIGH (ref 15–41)
Albumin: 1.9 g/dL — ABNORMAL LOW (ref 3.5–5.0)
Alkaline Phosphatase: 78 U/L (ref 38–126)
Anion gap: 3 — ABNORMAL LOW (ref 5–15)
BUN: 33 mg/dL — ABNORMAL HIGH (ref 8–23)
CO2: 25 mmol/L (ref 22–32)
Calcium: 8.7 mg/dL — ABNORMAL LOW (ref 8.9–10.3)
Chloride: 105 mmol/L (ref 98–111)
Creatinine, Ser: 0.99 mg/dL (ref 0.61–1.24)
GFR, Estimated: >60 mL/min (ref >60)
Glucose, Bld: 99 mg/dL (ref 70–99)
Potassium: 4.5 mmol/L (ref 3.5–5.1)
Sodium: 133 mmol/L — ABNORMAL LOW (ref 135–145)
Total Bilirubin: 1.6 mg/dL — ABNORMAL HIGH (ref 0.3–1.2)
Total Protein: 5.9 g/dL — ABNORMAL LOW (ref 6.5–8.1)

## 2021-05-25 LAB — CBC
HCT: 26.6 % — ABNORMAL LOW (ref 39.0–52.0)
Hemoglobin: 9 g/dL — ABNORMAL LOW (ref 13.0–17.0)
MCH: 31.4 pg (ref 26.0–34.0)
MCHC: 33.8 g/dL (ref 30.0–36.0)
MCV: 92.7 fL (ref 80.0–100.0)
Platelets: 173 10*3/uL (ref 150–400)
RBC: 2.87 MIL/uL — ABNORMAL LOW (ref 4.22–5.81)
RDW: 16.4 % — ABNORMAL HIGH (ref 11.5–15.5)
WBC: 12.6 10*3/uL — ABNORMAL HIGH (ref 4.0–10.5)
nRBC: 0.2 % (ref 0.0–0.2)

## 2021-05-25 LAB — GLUCOSE, CAPILLARY: Glucose-Capillary: 116 mg/dL — ABNORMAL HIGH (ref 70–99)

## 2021-05-25 MED ORDER — SODIUM CHLORIDE 0.9 % IV SOLN
1.0000 g | INTRAVENOUS | Status: AC
  Administered 2021-05-25 – 2021-05-30 (×6): 1 g via INTRAVENOUS
  Filled 2021-05-25: qty 1
  Filled 2021-05-25 (×6): qty 10

## 2021-05-25 MED ORDER — FUROSEMIDE 10 MG/ML IJ SOLN
40.0000 mg | Freq: Once | INTRAMUSCULAR | Status: AC
  Administered 2021-05-26: 40 mg via INTRAVENOUS
  Filled 2021-05-25: qty 4

## 2021-05-25 NOTE — Progress Notes (Signed)
SLP Cancellation Note ? ?Patient Details ?Name: [name] ?MRN: [id number] ?DOB: [birth year] ? ? ?Cancelled treatment:       Reason Eval/Treat Not Completed: Fatigue/lethargy limiting ability to participate  ? ?SLP consult received and appreciated. Chart review completed. Attempted clinical swallowing evaluation; however, pt too lethargic for safe participation. RN attempted to rouse pt without success. Will continue efforts as appropriate. ? ?RN aware and in agreement. ? ?Time attempted: 9166-0600 ? ?Cherrie Gauze, M.S., CCC-SLP ?Speech-Language Pathologist ?[facility] ?[phone] ([place])  ? ?[name] ?05/25/2021, 12:50 PM ?

## 2021-05-25 NOTE — Plan of Care (Signed)
?  Problem: Education: ?Goal: Knowledge of General Education information will improve ?Description: Including pain rating scale, medication(s)/side effects and non-pharmacologic comfort measures ?Outcome: Progressing ?  ?Problem: Health Behavior/Discharge Planning: ?Goal: Ability to manage health-related needs will improve ?Outcome: Progressing ?  ?Problem: Clinical Measurements: ?Goal: Ability to maintain clinical measurements within normal limits will improve ?Outcome: Progressing ?  ?Problem: Clinical Measurements: ?Goal: Will remain free from infection ?Outcome: Progressing ?  ?Problem: Clinical Measurements: ?Goal: Diagnostic test results will improve ?Outcome: Progressing ?  ?Problem: Clinical Measurements: ?Goal: Respiratory complications will improve ?Outcome: Progressing ?  ?Problem: Clinical Measurements: ?Goal: Cardiovascular complication will be avoided ?Outcome: Progressing ?  ?Problem: Activity: ?Goal: Risk for activity intolerance will decrease ?Outcome: Progressing ?  ?Problem: Nutrition: ?Goal: Adequate nutrition will be maintained ?Outcome: Progressing ?  ?Problem: Coping: ?Goal: Level of anxiety will decrease ?Outcome: Progressing ?  ?Problem: Elimination: ?Goal: Will not experience complications related to bowel motility ?Outcome: Progressing ?Goal: Will not experience complications related to urinary retention ?Outcome: Progressing ?  ?Problem: Pain Managment: ?Goal: General experience of comfort will improve ?Outcome: Progressing ?  ?Problem: Safety: ?Goal: Ability to remain free from injury will improve ?Outcome: Progressing ?  ?Problem: Skin Integrity: ?Goal: Risk for impaired skin integrity will decrease ?Outcome: Progressing ?  ?

## 2021-05-25 NOTE — Progress Notes (Signed)
?PROGRESS NOTE ? ? ? ?[name]  LEX:[id number] DOB: April 04, 1945 DOA: 06/01/2021 ?PCP: Remi Haggard, FNP ? ?Assessment & Plan: ?  ?Principal Problem: ?  Closed left hip fracture ([place]) ?Active Problems: ?  Long QT interval ?  Obstructive chronic bronchitis without exacerbation ([place]) ?  Hypothyroidism ?  Dementia with behavioral disturbance ([place]) ?  BPH (benign prostatic hyperplasia) ?  Hyponatremia ?  Anemia, unspecified ?  Elevated liver function tests ?  Malnutrition of moderate degree ? ? ?Closed left hip fracture: s/p left intramedullary nail intertrochanteric as per ortho surg on 06/09/2021. OT/PT recs SNF but still unable to reach pt's guardian  ? ?Possible dysphagia: coughs after eating and drinking. Speech consulted  ?  ?Long QT interval: hold seroquel  ?  ?Chronic bronchitis: w/o exacerbation. Continue on bronchodilators  ?  ?Hypothyroidism: continue on levothyroxine  ? ?BPH: continue on tamsulosin  ?  ?Dementia: w/ behavioral disturbance. Continue w/ supportive care. Continue w/ olanzapine which was changed from seroquel secondary to QT prolongation  ? ?Transaminitis: ALT is WNL. AST is labile.  ? ?Thrombocytopenia: resolved  ?  ?Normocytic anemia: w/ likely component of acute blood loss anemia from recent surg. S/p 1 unit of pRBCs transfused. H&H are trending up  ?  ?Hyponatremia: stable  ? ?Moderate protein-calorie malnutrition: BMI 19.7. Continue on nutritional supplements  ? ? ?DVT prophylaxis: lovenox  ?Code Status: DNR  ?Family Communication:  ?Disposition Plan:  likely d/c to SNF. Unable to reach pt's legal guardian so far ? ?Level of care: Med-Surg ?Status is: Inpatient ?Remains inpatient appropriate because: needs SNF placement but unable to reach pt's legal guardian so far  ? ? ? ?Consultants:  ?Ortho surg  ? ?Procedures:  ? ?Antimicrobials:  ? ? ?Subjective: ?Pt c/o fatigue  ? ?Objective: ?Vitals:  ? 05/24/21 1534 05/24/21 1946 05/25/21 0500 05/25/21 0738  ?BP: 123/73 110/65 (!) 103/44 108/62   ?Pulse: 97 97 84 87  ?Resp: 18 19 19 18   ?Temp: 98.5 ?F (36.9 ?C) (!) 101.2 ?F (38.4 ?C) 98.6 ?F (37 ?C) 98.7 ?F (37.1 ?C)  ?TempSrc:  Oral Oral   ?SpO2: 91% 92% 91% 92%  ?Weight:      ?Height:      ? ? ?Intake/Output Summary (Last 24 hours) at 05/25/2021 0746 ?Last data filed at 05/24/2021 1843 ?Gross per 24 hour  ?Intake 802 ml  ?Output --  ?Net 802 ml  ? ?Filed Weights  ? 05/29/2021 0541 05/26/2021 1142  ?Weight: 55.6 kg 55.6 kg  ? ? ?Examination: ? ?General exam: Appears calm. Frail appearing  ?Respiratory system: clear breath sounds b/l  ?Cardiovascular system: S1/S2+. No rubs or gallops  ?Gastrointestinal system: Abd is soft, NT, ND & hypoactive bowel sounds  ?Central nervous system: Alert and awake. Moves all extremities  ?Psychiatry: judgement and insight appears poor. Flat mood and affect ? ? ? ?Data Reviewed: I have personally reviewed following labs and imaging studies ? ?CBC: ?Recent Labs  ?Lab 06/06/2021 ?4496 05/21/21 ?7591 05/22/21 ?0602 05/23/21 ?6384 05/24/21 ?6659 05/24/21 ?1626 05/24/21 ?1905 05/25/21 ?9357  ?WBC 11.4* 19.1* 18.0* 14.6* 11.8*  --   --  12.6*  ?NEUTROABS 9.1*  --   --   --   --   --   --   --   ?HGB 9.7* 7.4* 7.5* 7.7* 6.9* 9.9* 8.9* 9.0*  ?HCT 28.9* 21.9* 22.3* 23.4* 20.3* 29.2* 26.4* 26.6*  ?MCV 93.5 93.2 93.7 96.3 95.8  --   --  92.7  ?PLT 157 132*  150 173 161  --   --  173  ? ?Basic Metabolic Panel: ?Recent Labs  ?Lab 05/21/21 ?0630 05/22/21 ?0602 05/23/21 ?1601 05/24/21 ?0932 05/25/21 ?3557  ?NA 134* 132* 132* 131* 133*  ?K 4.0 4.4 4.5 4.8 4.5  ?CL 106 104 101 103 105  ?CO2 24 25 26 24 25   ?GLUCOSE 119* 130* 111* 94 99  ?BUN 24* 32* 35* 36* 33*  ?CREATININE 1.12 0.95 1.05 0.96 0.99  ?CALCIUM 8.2* 8.3* 8.5* 8.7* 8.7*  ? ?GFR: ?Estimated Creatinine Clearance: 48.4 mL/min (by C-G formula based on SCr of 0.99 mg/dL). ?Liver Function Tests: ?Recent Labs  ?Lab 05/21/21 ?3220 05/22/21 ?0602 05/23/21 ?2542 05/24/21 ?7062 05/25/21 ?3762  ?AST 46* 44* 45* 43* 44*  ?ALT 38 26 27 25 26   ?ALKPHOS  94 101 97 81 78  ?BILITOT 0.8 0.8 1.0 1.3* 1.6*  ?PROT 5.4* 5.3* 5.6* 5.4* 5.9*  ?ALBUMIN 2.0* 1.9* 2.0* 1.8* 1.9*  ? ?No results for input(s): LIPASE, AMYLASE in the last 168 hours. ?No results for input(s): AMMONIA in the last 168 hours. ?Coagulation Profile: ?Recent Labs  ?Lab 05/28/2021 ?8315  ?INR 1.4*  ? ?Cardiac Enzymes: ?No results for input(s): CKTOTAL, CKMB, CKMBINDEX, TROPONINI in the last 168 hours. ?BNP (last 3 results) ?No results for input(s): PROBNP in the last 8760 hours. ?HbA1C: ?No results for input(s): HGBA1C in the last 72 hours. ?CBG: ?No results for input(s): GLUCAP in the last 168 hours. ?Lipid Profile: ?No results for input(s): CHOL, HDL, LDLCALC, TRIG, CHOLHDL, LDLDIRECT in the last 72 hours. ?Thyroid Function Tests: ?No results for input(s): TSH, T4TOTAL, FREET4, T3FREE, THYROIDAB in the last 72 hours. ?Anemia Panel: ?No results for input(s): VITAMINB12, FOLATE, FERRITIN, TIBC, IRON, RETICCTPCT in the last 72 hours. ? ?Sepsis Labs: ?No results for input(s): PROCALCITON, LATICACIDVEN in the last 168 hours. ? ?Recent Results (from the past 240 hour(s))  ?Resp Panel by RT-PCR (Flu A&B, Covid) Nasopharyngeal Swab     Status: None  ? Collection Time: 06/15/2021  5:57 AM  ? Specimen: Nasopharyngeal Swab; Nasopharyngeal(NP) swabs in vial transport medium  ?Result Value Ref Range Status  ? SARS Coronavirus 2 by RT PCR NEGATIVE NEGATIVE Final  ?  Comment: (NOTE) ?SARS-CoV-2 target nucleic acids are NOT DETECTED. ? ?The SARS-CoV-2 RNA is generally detectable in upper respiratory ?specimens during the acute phase of infection. The lowest ?concentration of SARS-CoV-2 viral copies this assay can detect is ?138 copies/mL. A negative result does not preclude SARS-Cov-2 ?infection and should not be used as the sole basis for treatment or ?other patient management decisions. A negative result may occur with  ?improper specimen collection/handling, submission of specimen other ?than nasopharyngeal swab,  presence of viral mutation(s) within the ?areas targeted by this assay, and inadequate number of viral ?copies(<138 copies/mL). A negative result must be combined with ?clinical observations, patient history, and epidemiological ?information. The expected result is Negative. ? ?Fact Sheet for Patients:  ?EntrepreneurPulse.com.au ? ?Fact Sheet for Healthcare Providers:  ?IncredibleEmployment.be ? ?This test is no t yet approved or cleared by the [place] FDA and  ?has been authorized for detection and/or diagnosis of SARS-CoV-2 by ?FDA under an Emergency Use Authorization (EUA). This EUA will remain  ?in effect (meaning this test can be used) for the duration of the ?COVID-19 declaration under Section 564(b)(1) of the Act, 21 ?U.S.C.section 360bbb-3(b)(1), unless the authorization is terminated  ?or revoked sooner.  ? ? ?  ? Influenza A by PCR NEGATIVE NEGATIVE Final  ? Influenza B  by PCR NEGATIVE NEGATIVE Final  ?  Comment: (NOTE) ?The Xpert Xpress SARS-CoV-2/FLU/RSV plus assay is intended as an aid ?in the diagnosis of influenza from Nasopharyngeal swab specimens and ?should not be used as a sole basis for treatment. Nasal washings and ?aspirates are unacceptable for Xpert Xpress SARS-CoV-2/FLU/RSV ?testing. ? ?Fact Sheet for Patients: ?EntrepreneurPulse.com.au ? ?Fact Sheet for Healthcare Providers: ?IncredibleEmployment.be ? ?This test is not yet approved or cleared by the [place] FDA and ?has been authorized for detection and/or diagnosis of SARS-CoV-2 by ?FDA under an Emergency Use Authorization (EUA). This EUA will remain ?in effect (meaning this test can be used) for the duration of the ?COVID-19 declaration under Section 564(b)(1) of the Act, 21 U.S.C. ?section 360bbb-3(b)(1), unless the authorization is terminated or ?revoked. ? ?Performed at [facility], [place], ?[place] [address] ?  ?   ? ? ? ? ? ?Radiology Studies: ?No results found. ? ? ? ? ? ?Scheduled Meds: ? sodium chloride   Intravenous Once  ? vitamin C  500 mg Oral Daily  ? buPROPion  150 mg Oral Daily  ? docusate sodium  100 mg Oral BID  ? enoxap

## 2021-05-25 NOTE — Progress Notes (Signed)
Physical Therapy Treatment ?Patient Details ?Name: [name] ?MRN: [id number] ?DOB: [birth year] ?Today's Date: 05/25/2021 ? ? ?History of Present Illness Pt is a 76 y.o. male with medical history significant for liver cirrhosis with history of alcohol abuse and hepatitis C, COPD, dementia with behavioral changes, BPH, dementia, hepatitis C, and throat and lung cancer, who presented to the ER with acute onset of left hip pain after having a mechanical fall.  MD assessment includes closed left hip fracture now s/p IM nail, long QT interval, hypothyroidism, hyponatremia, anemia, and elevated liver function tests. ? ?  ?PT Comments  ? ? Participated in exercises as described below.  To EOB with mod a x 2.  Improved sitting today but still requires hands on assist for safety.  Stood with RW and mod a x 2 and overall initially does better with steps but remains very small shuffling.  About 1/2 way to chair he seems to become nervous with activity and requires increased assist of +2 to safely transition back to sitting.  Remains in chair with needs met. ? ?  ?Recommendations for follow up therapy are one component of a multi-disciplinary discharge planning process, led by the attending physician.  Recommendations may be updated based on patient status, additional functional criteria and insurance authorization. ? ?Follow Up Recommendations ? Skilled nursing-short term rehab (<3 hours/day) ?  ?  ?Assistance Recommended at Discharge    ?Patient can return home with the following Two people to help with walking and/or transfers;Two people to help with bathing/dressing/bathroom;Direct supervision/assist for medications management;Assistance with cooking/housework;Assist for transportation ?  ?Equipment Recommendations ? Rolling walker (2 wheels)  ?  ?Recommendations for Other Services   ? ? ?  ?Precautions / Restrictions Precautions ?Precautions: Fall ?Restrictions ?Weight Bearing Restrictions: Yes ?LLE Weight Bearing: Weight  bearing as tolerated  ?  ? ?Mobility ? Bed Mobility ?Overal bed mobility: Needs Assistance ?Bed Mobility: Supine to Sit ?  ?  ?Supine to sit: Mod assist, Max assist, +2 for physical assistance ?  ?  ?  ?  ? ?Transfers ?Overall transfer level: Needs assistance ?Equipment used: Rolling walker (2 wheels) ?Transfers: Sit to/from Stand ?Sit to Stand: Mod assist, +2 physical assistance, From elevated surface ?Stand pivot transfers: Mod assist, +2 physical assistance ?  ?  ?  ?  ?  ?  ? ?Ambulation/Gait ?  ?  ?  ?  ?Gait velocity: decreased ?  ?  ?General Gait Details: no true gait, very small pivoting steps on feet ? ? ?Stairs ?  ?  ?  ?  ?  ? ? ?Wheelchair Mobility ?  ? ?Modified Rankin (Stroke Patients Only) ?  ? ? ?  ?Balance Overall balance assessment: Needs assistance ?Sitting-balance support: Bilateral upper extremity supported, Feet supported ?Sitting balance-Leahy Scale: Poor ?Sitting balance - Comments: hands on assist for safety ?  ?Standing balance support: Bilateral upper extremity supported, During functional activity, Reliant on assistive device for balance ?Standing balance-Leahy Scale: Poor ?  ?  ?  ?  ?  ?  ?  ?  ?  ?  ?  ?  ?  ? ?  ?Cognition Arousal/Alertness: Awake/alert ?Behavior During Therapy: [facility] for tasks assessed/performed ?Overall Cognitive Status: No family/caregiver present to determine baseline cognitive functioning ?  ?  ?  ?  ?  ?  ?  ?  ?  ?  ?  ?  ?  ?  ?  ?  ?  ?  ?  ? ?  ?  Exercises Other Exercises ?Other Exercises: supine AAROM - resists at times ? ?  ?General Comments   ?  ?  ? ?Pertinent Vitals/Pain Pain Assessment ?Pain Assessment: Faces ?Faces Pain Scale: Hurts little more ?Pain Location: L hip with movement ?Pain Descriptors / Indicators: Grimacing, Guarding ?Pain Intervention(s): Monitored during session, Repositioned, Limited activity within patient's tolerance  ? ? ?Home Living   ?  ?  ?  ?  ?  ?  ?  ?  ?  ?   ?  ?Prior Function    ?  ?  ?   ? ?PT Goals (current goals can now be  found in the care plan section) Progress towards PT goals: Progressing toward goals ? ?  ?Frequency ? ? ? 7X/week ? ? ? ?  ?PT Plan Current plan remains appropriate  ? ? ?Co-evaluation   ?  ?  ?  ?  ? ?  ?AM-PAC PT "6 Clicks" Mobility   ?Outcome Measure ? Help needed turning from your back to your side while in a flat bed without using bedrails?: Total ?Help needed moving from lying on your back to sitting on the side of a flat bed without using bedrails?: Total ?Help needed moving to and from a bed to a chair (including a wheelchair)?: A Lot ?Help needed standing up from a chair using your arms (e.g., wheelchair or bedside chair)?: A Lot ?Help needed to walk in hospital room?: Total ?Help needed climbing 3-5 steps with a railing? : Total ?6 Click Score: 8 ? ?  ?End of Session Equipment Utilized During Treatment: Gait belt ?Activity Tolerance: Patient tolerated treatment well ?Patient left: in chair;with call bell/phone within reach;with chair alarm set ?Nurse Communication: Mobility status ?PT Visit Diagnosis: Unsteadiness on feet (R26.81);History of falling (Z91.81);Difficulty in walking, not elsewhere classified (R26.2);Muscle weakness (generalized) (M62.81);Pain ?Pain - Right/Left: Left ?Pain - part of body: Hip ?  ? ? ?Time: 1916-6060 ?PT Time Calculation (min) (ACUTE ONLY): 16 min ? ?Charges:  $Therapeutic Exercise: 8-22 mins          ?         Chesley Noon, PTA ?05/25/21, 10:08 AM ? ?

## 2021-05-25 NOTE — Progress Notes (Signed)
? ?      CROSS COVER NOTE ? ?NAME: [name] ?MRN: [id number] ?DOB : 02/08/1946 ? ? ? ?Date of Service ?  05/25/2021  ?HPI/Events of Note ?  Secure chat received from nursing reporting SPO2 in mid 80s on room air and now requiring 13L via HFNC. ? ?Patient continued to desat on HFNC to 88% and ultimately required Heated High flow 50L/60%. ? ?Mr [name] was admitted 05/28/2021 after a fall where he fractured his hip and ortho surgically repaired the fracture. PMH liver cirrhosis, ETOH abuse, hepatitis C, COPD, dementia, BPH, throat and lung cancer.  ?Interventions ?  Plan:  ? ?Aspiration Pneumonia on CXR ?Ceftriaxone 1g IV Daily ?Blood Cultures ?Sputum Cultures ?NPO ?Aspiration Precautions ?Frequent suctioning ?SLP evaluation ?  ?Tachycardia ?TSH, Electrolytes, D-dimer ?TSH 12.6, Free T4 and T3 ordered with AM labs. Synthroid increased from 100 mcg to 150 mcg on 06/08/2021.  ?Supplement electrolytes to keep Mg > 2 and K >4 ? ? ?Elevated D-Dimer ?CTA Chest- Negative for PE ?BLE US DVT ? ?On auscultation patient has fine crackles. Home lasix has been held since 4/4. Will give IV lasix x1. ? ?Transfer to progressive for increasing oxygen requirements and heated high flow.  ?  ?Neomia Glass MHA, MSN, FNP-BC ?Nurse Practitioner ?[facility] ?[place] ?Pager [phone] ? ? ?

## 2021-05-26 ENCOUNTER — Encounter: Payer: Self-pay | Admitting: Orthopedic Surgery

## 2021-05-26 ENCOUNTER — Other Ambulatory Visit: Payer: Self-pay

## 2021-05-26 ENCOUNTER — Inpatient Hospital Stay: Payer: Medicare Other

## 2021-05-26 DIAGNOSIS — R1319 Other dysphagia: Secondary | ICD-10-CM | POA: Diagnosis not present

## 2021-05-26 DIAGNOSIS — S72002S Fracture of unspecified part of neck of left femur, sequela: Secondary | ICD-10-CM | POA: Diagnosis not present

## 2021-05-26 DIAGNOSIS — J690 Pneumonitis due to inhalation of food and vomit: Secondary | ICD-10-CM

## 2021-05-26 LAB — MAGNESIUM: Magnesium: 1.7 mg/dL (ref 1.7–2.4)

## 2021-05-26 LAB — D-DIMER, QUANTITATIVE: D-Dimer, Quant: 2.12 ug/mL-FEU — ABNORMAL HIGH (ref 0.00–0.50)

## 2021-05-26 LAB — BASIC METABOLIC PANEL
Anion gap: 5 (ref 5–15)
BUN: 34 mg/dL — ABNORMAL HIGH (ref 8–23)
CO2: 23 mmol/L (ref 22–32)
Calcium: 8.9 mg/dL (ref 8.9–10.3)
Chloride: 101 mmol/L (ref 98–111)
Creatinine, Ser: 1.02 mg/dL (ref 0.61–1.24)
GFR, Estimated: >60 mL/min (ref >60)
Glucose, Bld: 119 mg/dL — ABNORMAL HIGH (ref 70–99)
Potassium: 4.7 mmol/L (ref 3.5–5.1)
Sodium: 129 mmol/L — ABNORMAL LOW (ref 135–145)

## 2021-05-26 LAB — CBC
HCT: 28.8 % — ABNORMAL LOW (ref 39.0–52.0)
Hemoglobin: 9.8 g/dL — ABNORMAL LOW (ref 13.0–17.0)
MCH: 31.9 pg (ref 26.0–34.0)
MCHC: 34 g/dL (ref 30.0–36.0)
MCV: 93.8 fL (ref 80.0–100.0)
Platelets: 171 10*3/uL (ref 150–400)
RBC: 3.07 MIL/uL — ABNORMAL LOW (ref 4.22–5.81)
RDW: 17.1 % — ABNORMAL HIGH (ref 11.5–15.5)
WBC: 19.8 10*3/uL — ABNORMAL HIGH (ref 4.0–10.5)
nRBC: 0 % (ref 0.0–0.2)

## 2021-05-26 LAB — MRSA NEXT GEN BY PCR, NASAL: MRSA by PCR Next Gen: NOT DETECTED

## 2021-05-26 LAB — PROCALCITONIN: Procalcitonin: 1.48 ng/mL

## 2021-05-26 LAB — TSH: TSH: 12.605 u[IU]/mL — ABNORMAL HIGH (ref 0.350–4.500)

## 2021-05-26 LAB — T4, FREE: Free T4: 1.1 ng/dL (ref 0.61–1.12)

## 2021-05-26 MED ORDER — ORAL CARE MOUTH RINSE
15.0000 mL | Freq: Two times a day (BID) | OROMUCOSAL | Status: DC
  Administered 2021-05-26 – 2021-06-02 (×14): 15 mL via OROMUCOSAL

## 2021-05-26 MED ORDER — IOHEXOL 350 MG/ML SOLN
75.0000 mL | Freq: Once | INTRAVENOUS | Status: AC | PRN
  Administered 2021-05-26: 75 mL via INTRAVENOUS

## 2021-05-26 MED ORDER — HEPARIN SOD (PORK) LOCK FLUSH 10 UNIT/ML IV SOLN
10.0000 [IU] | Freq: Once | INTRAVENOUS | Status: AC
  Administered 2021-05-26: 10 [IU]
  Filled 2021-05-26: qty 1

## 2021-05-26 MED ORDER — CHLORHEXIDINE GLUCONATE 0.12 % MT SOLN
15.0000 mL | Freq: Two times a day (BID) | OROMUCOSAL | Status: DC
  Administered 2021-05-26 – 2021-06-02 (×14): 15 mL via OROMUCOSAL
  Filled 2021-05-26 (×12): qty 15

## 2021-05-26 MED ORDER — MAGNESIUM SULFATE 2 GM/50ML IV SOLN
2.0000 g | Freq: Once | INTRAVENOUS | Status: AC
  Administered 2021-05-26: 2 g via INTRAVENOUS
  Filled 2021-05-26: qty 50

## 2021-05-26 MED ORDER — CHLORHEXIDINE GLUCONATE CLOTH 2 % EX PADS
6.0000 | MEDICATED_PAD | Freq: Every day | CUTANEOUS | Status: DC
  Administered 2021-05-26 – 2021-05-30 (×4): 6 via TOPICAL

## 2021-05-26 NOTE — Progress Notes (Signed)
Upon 2000 rounding and vitals, NT found patients O2 to be 73 on RA. Patient was lifted up in bed and encouraged to cough and deep breathe. Patient was placed on [place] and titrated up to 4L and was still satting in the 80s. NP and RT were notified and reported to bedside. RT started pt on 13 L HFNC and patient was satting in the mid 90s. Patient had no iv access and per verbal order from [place], NP port was accessed at bedside. IV abx were started. Blood cultures drawn and CXR obtained. Pt was suctioned with yankeur and was sounding more congested. O2 sats were 88-90 on the 13 L HFNC and NP and RT were again notified. Transfer order was placed, report was called, and patient was transferred to the ICU on 15 L with a non-rebreather.  ?

## 2021-05-26 NOTE — Progress Notes (Addendum)
OT Cancellation Note ? ?Patient Details ?Name: [name] ?MRN: [id number] ?DOB: [birth year] ? ? ?Cancelled Treatment:    Reason Eval/Treat Not Completed: Medical issues which prohibited therapy. Pt noted with transfer to ICU yesterday. Spoke with MD who will assess the pt and determine if pt is appropriate to continue with therapy. Will hold until then.  ? ?Addendum 9:56am: per secure chat with MD, pt not yet medically appropriate for continuation of therapy. Will complete current OT orders and await new orders once appropriate.  ? ? ?Ardeth Perfect., MPH, MS, OTR/L ?ascom [phone] ?05/26/21, 9:51 AM ?

## 2021-05-26 NOTE — Progress Notes (Signed)
?   05/25/21 2249  ?Assess: MEWS Score  ?BP (!) 108/43  ?Pulse Rate (!) 115  ?SpO2 (!) 85 % ?(NP notified)  ?O2 Device HFNC  ?O2 Flow Rate (L/min) 13 L/min  ?Assess: MEWS Score  ?MEWS Temp 0  ?MEWS Systolic 0  ?MEWS Pulse 2  ?MEWS RR 2  ?MEWS LOC 0  ?MEWS Score 4  ?MEWS Score Color Red  ?Assess: if the MEWS score is Yellow or Red  ?Were vital signs taken at a resting state? Yes  ?Focused Assessment Change from prior assessment (see assessment flowsheet)  ?Early Detection of Sepsis Score *See Row Information* High  ?MEWS guidelines implemented *See Row Information* Yes  ?Treat  ?MEWS Interventions Administered scheduled meds/treatments;Administered prn meds/treatments;Escalated (See documentation below);Consulted Respiratory Therapy  ?Pain Scale 0-10  ?Pain Score 0  ?Take Vital Signs  ?Increase Vital Sign Frequency  Red: Q 1hr X 4 then Q 4hr X 4, if remains red, continue Q 4hrs  ?Escalate  ?MEWS: Escalate Red: discuss with charge nurse/RN and provider, consider discussing with RRT  ?Notify: Charge Nurse/RN  ?Name of Charge Nurse/RN Notified Helene Kelp RN  ?Date Charge Nurse/RN Notified 05/25/21  ?Time Charge Nurse/RN Notified 2230  ?Notify: Provider  ?Provider Name/Title Neomia Glass NP  ?Date Provider Notified 05/25/21  ?Time Provider Notified 2250  ?Notification Type Page  ?Notification Reason Change in status ?(O2 sats dropping while on 13 L HFNC)  ?Provider response See new orders  ?Document  ?Patient Outcome Transferred/level of care increased  ?Assess: SIRS CRITERIA  ?SIRS Temperature  0  ?SIRS Pulse 1  ?SIRS Respirations  1  ?SIRS WBC 0  ?SIRS Score Sum  2  ? ? ?

## 2021-05-26 NOTE — Progress Notes (Addendum)
?PROGRESS NOTE ? ? ? ?[name]  MCN:[id number] DOB: 1945-11-25 DOA: 05/28/2021 ?PCP: Remi Haggard, FNP ? ?Assessment & Plan: ?  ?Principal Problem: ?  Closed left hip fracture ([place]) ?Active Problems: ?  Long QT interval ?  Obstructive chronic bronchitis without exacerbation ([place]) ?  Hypothyroidism ?  Dementia with behavioral disturbance ([place]) ?  BPH (benign prostatic hyperplasia) ?  Hyponatremia ?  Anemia, unspecified ?  Elevated liver function tests ?  Malnutrition of moderate degree ? ? ?Closed left hip fracture: s/p left intramedullary nail intertrochanteric as per ortho surg on 06/09/2021. PT/OT recs SNF but still unable to reach pt's guardian  ? ?Possible dysphagia: coughs after eating and drinking. NPO. Speech following and recs apprec  ? ?Aspiration pneumonia: started on IV ceftriaxone overnight as pt has a penicillin allergy. NPO. Speech following and recs apprec  ?  ?Long QT interval: hold seroquel  ?  ?Chronic bronchitis: w/o exacerbation. Continue on bronchodilators  ?  ?Hypothyroidism: continue on levothyroxine  ? ?BPH: continue on tamsulosin  ?  ?Dementia: w/ behavioral disturbance. Continue on olanzapine which was changed from seroquel secondary prolonged QT  ? ?Continue on Continue w/ olanzapine which was changed from seroquel secondary to QT prolongation  ? ?Transaminitis: AST is labile. ALT is WNL ? ?Thrombocytopenia: resolved  ?  ?Normocytic anemia: w/ likely component of acute blood loss anemia. S/p 1 unit of pRBCs transfused. H&H are trending up  ?  ?Hyponatremia: labile. Will continue to monitor  ? ?Moderate protein-calorie malnutrition: BMI 19.7. Continue on nutritional supplements. Palliative care conuslted ? ? ?DVT prophylaxis: lovenox  ?Code Status: DNR  ?Family Communication:  ?Disposition Plan:  likely d/c to SNF.  ? ?Level of care: ICU ?Status is: Inpatient ?Remains inpatient appropriate because: NPO now secondary to aspirating. Requiring IV abxs.  ? ? ? ?Consultants:  ?Ortho surg   ? ?Procedures:  ? ?Antimicrobials:  ? ? ?Subjective: ?Pt c/o malaise  ? ?Objective: ?Vitals:  ? 05/26/21 0600 05/26/21 0700 05/26/21 0710 05/26/21 0804  ?BP: 115/65 (!) 99/39 (!) 90/52   ?Pulse: (!) 33 89 88   ?Resp: (!) 26 14 14    ?Temp:      ?TempSrc:      ?SpO2:    93%  ?Weight:      ?Height:      ? ? ?Intake/Output Summary (Last 24 hours) at 05/26/2021 0828 ?Last data filed at 05/26/2021 0500 ?Gross per 24 hour  ?Intake 405.33 ml  ?Output 450 ml  ?Net -44.67 ml  ? ?Filed Weights  ? 05/21/2021 0541 06/12/2021 1142 05/26/21 0000  ?Weight: 55.6 kg 55.6 kg 50.6 kg  ? ? ?Examination: ? ?General exam: Appears calm & comfortable. Frail appearing   ?Respiratory system: clear breath sounds b/l  ?Cardiovascular system: S1 & S2+. No rubs or clicks   ?Gastrointestinal system: Abd is soft, NT, ND & hypoactive bowel sounds  ?Central nervous system: Alert and awake. Moves all extremities ?Psychiatry: judgement and insight appears at baseline. Flat mood and affect ? ? ? ?Data Reviewed: I have personally reviewed following labs and imaging studies ? ?CBC: ?Recent Labs  ?Lab 05/19/2021 ?6294 05/21/21 ?7654 05/22/21 ?0602 05/23/21 ?6503 05/24/21 ?5465 05/24/21 ?1626 05/24/21 ?1905 05/25/21 ?6812 05/26/21 ?0106  ?WBC 11.4*   < > 18.0* 14.6* 11.8*  --   --  12.6* 19.8*  ?NEUTROABS 9.1*  --   --   --   --   --   --   --   --   ?  HGB 9.7*   < > 7.5* 7.7* 6.9* 9.9* 8.9* 9.0* 9.8*  ?HCT 28.9*   < > 22.3* 23.4* 20.3* 29.2* 26.4* 26.6* 28.8*  ?MCV 93.5   < > 93.7 96.3 95.8  --   --  92.7 93.8  ?PLT 157   < > 150 173 161  --   --  173 171  ? < > = values in this interval not displayed.  ? ?Basic Metabolic Panel: ?Recent Labs  ?Lab 05/22/21 ?0602 05/23/21 ?0420 05/24/21 ?3664 05/25/21 ?4034 05/26/21 ?0106  ?NA 132* 132* 131* 133* 129*  ?K 4.4 4.5 4.8 4.5 4.7  ?CL 104 101 103 105 101  ?CO2 25 26 24 25 23   ?GLUCOSE 130* 111* 94 99 119*  ?BUN 32* 35* 36* 33* 34*  ?CREATININE 0.95 1.05 0.96 0.99 1.02  ?CALCIUM 8.3* 8.5* 8.7* 8.7* 8.9  ?MG  --   --   --    --  1.7  ? ?GFR: ?Estimated Creatinine Clearance: 42.7 mL/min (by C-G formula based on SCr of 1.02 mg/dL). ?Liver Function Tests: ?Recent Labs  ?Lab 05/21/21 ?7425 05/22/21 ?0602 05/23/21 ?9563 05/24/21 ?8756 05/25/21 ?4332  ?AST 46* 44* 45* 43* 44*  ?ALT 38 26 27 25 26   ?ALKPHOS 94 101 97 81 78  ?BILITOT 0.8 0.8 1.0 1.3* 1.6*  ?PROT 5.4* 5.3* 5.6* 5.4* 5.9*  ?ALBUMIN 2.0* 1.9* 2.0* 1.8* 1.9*  ? ?No results for input(s): LIPASE, AMYLASE in the last 168 hours. ?No results for input(s): AMMONIA in the last 168 hours. ?Coagulation Profile: ?Recent Labs  ?Lab 06/13/2021 ?9518  ?INR 1.4*  ? ?Cardiac Enzymes: ?No results for input(s): CKTOTAL, CKMB, CKMBINDEX, TROPONINI in the last 168 hours. ?BNP (last 3 results) ?No results for input(s): PROBNP in the last 8760 hours. ?HbA1C: ?No results for input(s): HGBA1C in the last 72 hours. ?CBG: ?Recent Labs  ?Lab 05/25/21 ?2348  ?GLUCAP 116*  ? ?Lipid Profile: ?No results for input(s): CHOL, HDL, LDLCALC, TRIG, CHOLHDL, LDLDIRECT in the last 72 hours. ?Thyroid Function Tests: ?Recent Labs  ?  05/26/21 ?0106 05/26/21 ?0325  ?TSH 12.605*  --   ?FREET4  --  1.10  ? ?Anemia Panel: ?No results for input(s): VITAMINB12, FOLATE, FERRITIN, TIBC, IRON, RETICCTPCT in the last 72 hours. ? ?Sepsis Labs: ?Recent Labs  ?Lab 05/26/21 ?0325  ?PROCALCITON 1.48  ? ? ?Recent Results (from the past 240 hour(s))  ?Resp Panel by RT-PCR (Flu A&B, Covid) Nasopharyngeal Swab     Status: None  ? Collection Time: 05/22/2021  5:57 AM  ? Specimen: Nasopharyngeal Swab; Nasopharyngeal(NP) swabs in vial transport medium  ?Result Value Ref Range Status  ? SARS Coronavirus 2 by RT PCR NEGATIVE NEGATIVE Final  ?  Comment: (NOTE) ?SARS-CoV-2 target nucleic acids are NOT DETECTED. ? ?The SARS-CoV-2 RNA is generally detectable in upper respiratory ?specimens during the acute phase of infection. The lowest ?concentration of SARS-CoV-2 viral copies this assay can detect is ?138 copies/mL. A negative result does not  preclude SARS-Cov-2 ?infection and should not be used as the sole basis for treatment or ?other patient management decisions. A negative result may occur with  ?improper specimen collection/handling, submission of specimen other ?than nasopharyngeal swab, presence of viral mutation(s) within the ?areas targeted by this assay, and inadequate number of viral ?copies(<138 copies/mL). A negative result must be combined with ?clinical observations, patient history, and epidemiological ?information. The expected result is Negative. ? ?Fact Sheet for Patients:  ?EntrepreneurPulse.com.au ? ?Fact Sheet for Healthcare Providers:  ?IncredibleEmployment.be ? ?  This test is no t yet approved or cleared by the [place] FDA and  ?has been authorized for detection and/or diagnosis of SARS-CoV-2 by ?FDA under an Emergency Use Authorization (EUA). This EUA will remain  ?in effect (meaning this test can be used) for the duration of the ?COVID-19 declaration under Section 564(b)(1) of the Act, 21 ?U.S.C.section 360bbb-3(b)(1), unless the authorization is terminated  ?or revoked sooner.  ? ? ?  ? Influenza A by PCR NEGATIVE NEGATIVE Final  ? Influenza B by PCR NEGATIVE NEGATIVE Final  ?  Comment: (NOTE) ?The Xpert Xpress SARS-CoV-2/FLU/RSV plus assay is intended as an aid ?in the diagnosis of influenza from Nasopharyngeal swab specimens and ?should not be used as a sole basis for treatment. Nasal washings and ?aspirates are unacceptable for Xpert Xpress SARS-CoV-2/FLU/RSV ?testing. ? ?Fact Sheet for Patients: ?EntrepreneurPulse.com.au ? ?Fact Sheet for Healthcare Providers: ?IncredibleEmployment.be ? ?This test is not yet approved or cleared by the [place] FDA and ?has been authorized for detection and/or diagnosis of SARS-CoV-2 by ?FDA under an Emergency Use Authorization (EUA). This EUA will remain ?in effect (meaning this test can be used) for the  duration of the ?COVID-19 declaration under Section 564(b)(1) of the Act, 21 U.S.C. ?section 360bbb-3(b)(1), unless the authorization is terminated or ?revoked. ? ?Performed at [facility], [place]

## 2021-05-26 NOTE — Evaluation (Addendum)
Clinical/Bedside Swallow Evaluation ?Patient Details  ?Name: [name] ?MRN: [id number] ?Date of Birth: 07-Apr-1945 ? ?Today's Date: 05/26/2021 ?Time: SLP Start Time (ACUTE ONLY): [id number] SLP Stop Time (ACUTE ONLY): 0845 ?SLP Time Calculation (min) (ACUTE ONLY): 10 min ? ?Past Medical History:  ?Past Medical History:  ?Diagnosis Date  ? Alcohol abuse   ? Cirrhosis ([place])   ? COPD (chronic obstructive pulmonary disease) ([place])   ? Dementia ([place])   ? Hepatitis C   ? Malnutrition ([place])   ? Throat cancer ([place])   ? Lung Cancer (presently)  History of throat cancer  ? ?Past Surgical History:  ?Past Surgical History:  ?Procedure Laterality Date  ? ESOPHAGOGASTRODUODENOSCOPY (EGD) WITH PROPOFOL N/A 08/03/2019  ? Procedure: ESOPHAGOGASTRODUODENOSCOPY (EGD) WITH PROPOFOL;  Surgeon: Lin Landsman, MD;  Location: [facility] ENDOSCOPY;  Service: Gastroenterology;  Laterality: N/A;  ? INTRAMEDULLARY (IM) NAIL INTERTROCHANTERIC Left 05/30/2021  ? Procedure: INTRAMEDULLARY (IM) NAIL INTERTROCHANTRIC;  Surgeon: Lovell Sheehan, MD;  Location: [facility] ORS;  Service: Orthopedics;  Laterality: Left;  ? IR IMAGING GUIDED PORT INSERTION  04/28/2019  ? TONSILLECTOMY    ? age was 53  ? VASECTOMY    ? ?HPI:  ?Per 27 H&P "[name] is a 76 y.o. male with medical history significant for liver cirrhosis with history of alcohol abuse and hepatitis C, COPD, dementia with behavioral changes,  BPH dementia, hepatitis C, throat and lung cancer, who presented to the ER with acute onset of left hip pain after having a mechanical fall.  The patient was getting up to urinate before he lost balance and fell.  He denies any presyncope or syncope.  No paresthesias or focal muscle weakness.  No chest pain or palpitations.  No headache or dizziness or blurred vision.  He did not have any head injuries.  No recent cough or wheezing or dyspnea or fever or chills.  No nausea or vomiting or abdominal pain.  No dysuria, oliguria or hematuria or flank pain." On  05/25/21, pt transferred "to progressive for increasing oxygen requirements and heated high flow." CXR "1. New diffuse bilateral airspace opacities suspicious for  multilobar pneumonia, possibly on the basis of aspiration. Other  potential etiologies include asymmetric edema and drug reaction.  2. Grossly stable treated right apical lesion. Evidence of chronic  aspiration in the left lower lobe."  ?  ?Assessment / Plan / Recommendation  ?Clinical Impression ? Pt seen for clinical swallowing evaluation. Pt lethargic, but able to rouse for attempts at PO trials. On 50L/min O2 via HFNC. Speech is difficult to understanding due to consonant imprecision and weak vocal quality. Cleared with RN.  ? ?Chart review remarkable for hx of laryngeal cancer, COPD, dementia, malnutrition, and dysphagia ("florid aspiration" on DG esophagus in 2020. Suspect chronicity of pt's dysphagia per chart review.  ? ?Oral care provided accordingly with items from oral care kit.  ? ?Pt unable to follow directions for participation in oral motor examination. Edentulism noted.  ? ?Attempted trials of ice chips via tsp. Despite max verbal/visual/tactile cueing, pt unable/unwilling to accept PO trials. Pt pulling head away and pursing lips. Pharyngeal swallow could not be clinically assessed given pt's inability to accept PO trials.  ? ?At present, a safe oral diet cannot be recommended. Pt is at increased risk for aspiration/aspiration PNA given mental status, dental status, medical hx, and comorbidities.  ? ?Recommend NPO with consideration for short term alternate route of nutrition/hydration/medication if in alignment with GOC. Pt may benefit from Palliative Care Consult. ? ?  SLP to f/u per POC for clinical swallowing re-evaluation as appropriate.  ? ?SLP Visit Diagnosis: Dysphagia, oropharyngeal phase (R13.12) ?   ?Aspiration Risk ? Severe aspiration risk;Risk for inadequate nutrition/hydration  ?  ?Diet Recommendation NPO;Alternative means -  temporary  ? ?Medication Administration: Via alternative means  ?  ?Other  Recommendations Oral Care Recommendations: Oral care QID;Staff/trained caregiver to provide oral care   ? ?Recommendations for follow up therapy are one component of a multi-disciplinary discharge planning process, led by the attending physician.  Recommendations may be updated based on patient status, additional functional criteria and insurance authorization. ? ?Follow up Recommendations Skilled nursing-short term rehab (<3 hours/day)  ? ? ?  ?Assistance Recommended at Discharge Frequent or constant Supervision/Assistance  ?Functional Status Assessment Patient has had a recent decline in their functional status and/or demonstrates limited ability to make significant improvements in function in a reasonable and predictable amount of time  ?Frequency and Duration min 2x/week  ?2 weeks ?  ?   ? ?Prognosis Prognosis for Safe Diet Advancement: Guarded ?Barriers to Reach Goals: Time post onset;Severity of deficits;Behavior  ? ?  ? ?Swallow Study   ?General Date of Onset: 06/07/2021 ?HPI: Per 67 H&P "[name] is a 76 y.o. male with medical history significant for liver cirrhosis with history of alcohol abuse and hepatitis C, COPD, dementia with behavioral changes,  BPH dementia, hepatitis C, throat and lung cancer, who presented to the ER with acute onset of left hip pain after having a mechanical fall.  The patient was getting up to urinate before he lost balance and fell.  He denies any presyncope or syncope.  No paresthesias or focal muscle weakness.  No chest pain or palpitations.  No headache or dizziness or blurred vision.  He did not have any head injuries.  No recent cough or wheezing or dyspnea or fever or chills.  No nausea or vomiting or abdominal pain.  No dysuria, oliguria or hematuria or flank pain." On 05/25/21, pt transferred "to progressive for increasing oxygen requirements and heated high flow." CXR "1. New diffuse  bilateral airspace opacities suspicious for  multilobar pneumonia, possibly on the basis of aspiration. Other  potential etiologies include asymmetric edema and drug reaction.  2. Grossly stable treated right apical lesion. Evidence of chronic  aspiration in the left lower lobe." ?Type of Study: Bedside Swallow Evaluation ?Previous Swallow Assessment: DG Esophagus 2020 "florid aspiration" ?Diet Prior to this Study: NPO ?Respiratory Status: Nasal cannula (50L/min via HFNC) ?History of Recent Intubation: Yes (for sx) ?Behavior/Cognition: Lethargic/Drowsy;Distractible;Doesn't follow directions ?Oral Cavity Assessment: Dry ?Oral Care Completed by SLP: Yes ?Oral Cavity - Dentition: Edentulous ?Vision:  (unable to assess) ?Patient Positioning: Upright in bed ?Baseline Vocal Quality: Low vocal intensity ?Volitional Cough: Cognitively unable to elicit ?Volitional Swallow: Unable to elicit  ?  ?Oral/Motor/Sensory Function Overall Oral Motor/Sensory Function: Other (comment) (unable to assess)   ?Ice Chips Ice chips: Impaired ?Presentation: Spoon ?Oral Phase Impairments:  (no attempt to retrieve from teaspoon; pulling head away; pursing lips) ?Oral Phase Functional Implications:  (as above) ?Pharyngeal Phase Impairments:  (unable to assess)   ?   ?Cherrie Gauze, M.S., CCC-SLP ?Speech-Language Pathologist ?[facility] ?[phone])  ? ?Quintella Baton ?05/26/2021,9:18 AM ? ? ? ?

## 2021-05-26 NOTE — Progress Notes (Addendum)
?   05/25/21 2045  ?Assess: MEWS Score  ?Temp 98.7 ?F (37.1 ?C)  ?Pulse Rate (!) 111  ?Resp (!) 26  ?SpO2 95 %  ?O2 Device HFNC  ?O2 Flow Rate (L/min) 13 L/min  ?Assess: MEWS Score  ?MEWS Temp 0  ?MEWS Systolic 0  ?MEWS Pulse 2  ?MEWS RR 2  ?MEWS LOC 0  ?MEWS Score 4  ?MEWS Score Color Red  ?Assess: if the MEWS score is Yellow or Red  ?Were vital signs taken at a resting state? Yes  ?Focused Assessment Change from prior assessment (see assessment flowsheet)  ?MEWS guidelines implemented *See Row Information* Yes  ?Treat  ?MEWS Interventions Administered scheduled meds/treatments;Administered prn meds/treatments;Consulted Respiratory Therapy  ?Take Vital Signs  ?Increase Vital Sign Frequency  Red: Q 1hr X 4 then Q 4hr X 4, if remains red, continue Q 4hrs  ?Escalate  ?MEWS: Escalate Red: discuss with charge nurse/RN and provider, consider discussing with RRT  ?Notify: Charge Nurse/RN  ?Name of Charge Nurse/RN Notified Helene Kelp RN  ?Date Charge Nurse/RN Notified 05/25/21  ?Time Charge Nurse/RN Notified 2100  ?Notify: Provider  ?Provider Name/Title [place] NP  ?Date Provider Notified 05/25/21  ?Time Provider Notified 2032  ?Notification Type Page  ?Notification Reason Change in status ?(low O2 sat despite placing on oxygen)  ?Provider response At bedside  ?Date of Provider Response 05/25/21  ?Time of Provider Response 2050  ?Document  ?Patient Outcome Transferred/level of care increased  ?Assess: SIRS CRITERIA  ?SIRS Temperature  0  ?SIRS Pulse 1  ?SIRS Respirations  1  ?SIRS WBC 0  ?SIRS Score Sum  2  ? ?Copy and pasted MEWS flowsheet per shift RN Rexford Maus ?

## 2021-05-26 NOTE — Progress Notes (Signed)
Physical Therapy Discharge ?Patient Details ?Name: [name] ?MRN: [id number] ?DOB: 04-Jul-1945 ?Today's Date: 05/26/2021 ?Time:  -  ?  ? ?Patient discharged from PT services secondary to transfer to ICU. ? ?Please see latest therapy progress note for current level of functioning and progress toward goals.   ? ? ?Per secure chat with MD, pt not yet medically appropriate for continuation of therapy. Will complete current PT orders and await new orders once appropriate.  ?GP ?   ? ?Chesley Noon ?05/26/2021, 10:51 AM  ?

## 2021-05-26 NOTE — Progress Notes (Signed)
Nutrition Follow Up Note  ? ?DOCUMENTATION CODES:  ? ?Non-severe (moderate) malnutrition in context of chronic illness ? ?INTERVENTION:  ? ?RD will monitor for diet advancement and add supplements when warranted.  ? ?If nasogastric tube placed, recommend: ? ?Osmolite 1.5@50ml /hr- Initiate at 39m/hr and increase by 158mhr q 8 hours until goal rate is reached. ? ?Pro-Source 4593mID via tube, provides 40kcal and 11g of protein per serving  ? ?Free water flushes 74m37m hours to maintain tube patency  ? ?Regimen provides 1880kcal/day, 97g/day protein and 1094ml17m of free water ? ?Pt at high refeed risk; recommend monitor potassium, magnesium and phosphorus labs daily until stable ? ?MVI, folic acid and thiamine daily via tube in setting of etoh abuse ? ?NUTRITION DIAGNOSIS:  ? ?Moderate Malnutrition related to chronic illness (liver cirrhosis, h/o ETOH abuse, hepatitis C, COPD, dementia) as evidenced by moderate fat depletion, severe muscle depletion. ? ?GOAL:  ? ?Patient will meet greater than or equal to 90% of their needs ?-not met  ? ?MONITOR:  ? ?Diet advancement, Labs, Weight trends, Skin, I & O's ? ?ASSESSMENT:  ? ?76 y/36male with h/o hypothyroidism, etoh abuse, COPD, anxiety, HCV, cirrhosis, substance abuse, metastatic lung cancer s/o chemo, throat cancer, dementia, gastric ulcer and BPH who is admitted with hip fracture after fall now s/p surgical repair 4/4. ? ?Pt with poor appetite and oral intake since admission; pt eating <50% of meals and has been eating only sips and bites over the weekend. Pt with concerns for aspiration overnight and was transferred to the ICU this morning. Pt seen by SLP and made NPO. Discussed pt's nutritional status with MD; MD feels as though pt would remove NGT. Palliative care consult pending. RD will monitor for GOC. Pt is at high refeed risk.  ? ?Per chart, pt is down ~12lbs since admission but appears to be back at his UBW.  ? ?Medications reviewed and include: vitamin C,  colace, lovenox, ferrous sulfate, folic acid, synthroid, MVI, aldactone, thiamine, vitamin D, ceftriaxone  ? ?Labs reviewed: Na 129(L), K 4.7 wnl, BUN 34(H), Mg 1.7 wnl ?Wbc- 19.8(H), Hgb 9.8(L), Hct 28.8(L) ? ?Diet Order:   ?Diet Order   ? ?       ?  Diet NPO time specified  Diet effective now       ?  ? ?  ?  ? ?  ? ?EDUCATION NEEDS:  ? ?No education needs have been identified at this time ? ?Skin:  Skin Assessment: Reviewed RN Assessment ? ?Last BM:  4/8- type 4 ? ?Height:  ? ?Ht Readings from Last 1 Encounters:  ?06/07/2021 5' 6"  (1.676 m)  ? ? ?Weight:  ? ?Wt Readings from Last 1 Encounters:  ?05/26/21 50.6 kg  ? ?BMI:  Body mass index is 18.01 kg/m?. ? ?Estimated Nutritional Needs:  ? ?Kcal:  1700-1900kcal/day ? ?Protein:  85-95g/day ? ?Fluid:  1.3-1.5L/day ? ?CaseyKoleen DistanceRD, LDN ?Please refer to AMION for RD and/or RD on-call/weekend/after hours pager ? ?

## 2021-05-26 NOTE — Progress Notes (Signed)
2 episodes of Pt with nonsustained HR increasing to 170. Seems to be SVT vs Afib RVR.  EKG captured, but unclear rhythm.  Neomia Glass, NP notified.  Labs + orders given ?

## 2021-05-26 NOTE — TOC Progression Note (Signed)
Transition of Care (TOC) - Progression Note  ? ? ?Patient Details  ?Name: [name] ?MRN: [id number] ?Date of Birth: 06-Jul-1945 ? ?Transition of Care (TOC) CM/SW Contact  ?Shelbie Hutching, RN ?Phone Number: ?05/26/2021, 3:31 PM ? ?Clinical Narrative:    ?Patient is currently in the ICU on HFNC 50 L 65% FiO2.  Patient is also NPO as he is not safe to take oral at this time.   ? ?TOC will cont to follow.  PT and OT have signed off temporarily with status changed to hight level of care.  Patient has several bed offers.  TOC will follow patient progress and present bed offers to DSS guardians once he is showing signs of improvement.  ? ? ?Expected Discharge Plan: [place] ?Barriers to Discharge: Continued Medical Work up ? ?Expected Discharge Plan and Services ?Expected Discharge Plan: [place] ?  ?Discharge Planning Services: CM Consult ?Post Acute Care Choice: [place] ?Living arrangements for the past 2 months: Group Home ?                ?DME Arranged: N/A ?DME Agency: NA ?  ?  ?  ?HH Arranged: NA ?[place] Agency: NA ?  ?  ?  ? ? ?Social Determinants of Health (SDOH) Interventions ?  ? ?Readmission Risk Interventions ?   ? View : No data to display.  ?  ?  ?  ? ? ?

## 2021-05-27 ENCOUNTER — Other Ambulatory Visit: Payer: Self-pay

## 2021-05-27 DIAGNOSIS — S72002S Fracture of unspecified part of neck of left femur, sequela: Secondary | ICD-10-CM | POA: Diagnosis not present

## 2021-05-27 DIAGNOSIS — F03918 Unspecified dementia, unspecified severity, with other behavioral disturbance: Secondary | ICD-10-CM | POA: Diagnosis not present

## 2021-05-27 DIAGNOSIS — J690 Pneumonitis due to inhalation of food and vomit: Secondary | ICD-10-CM | POA: Diagnosis not present

## 2021-05-27 LAB — BASIC METABOLIC PANEL
Anion gap: 7 (ref 5–15)
BUN: 36 mg/dL — ABNORMAL HIGH (ref 8–23)
CO2: 23 mmol/L (ref 22–32)
Calcium: 8.5 mg/dL — ABNORMAL LOW (ref 8.9–10.3)
Chloride: 104 mmol/L (ref 98–111)
Creatinine, Ser: 0.85 mg/dL (ref 0.61–1.24)
GFR, Estimated: >60 mL/min (ref >60)
Glucose, Bld: 85 mg/dL (ref 70–99)
Potassium: 4.4 mmol/L (ref 3.5–5.1)
Sodium: 134 mmol/L — ABNORMAL LOW (ref 135–145)

## 2021-05-27 LAB — CBC
HCT: 29.3 % — ABNORMAL LOW (ref 39.0–52.0)
Hemoglobin: 9.7 g/dL — ABNORMAL LOW (ref 13.0–17.0)
MCH: 31.6 pg (ref 26.0–34.0)
MCHC: 33.1 g/dL (ref 30.0–36.0)
MCV: 95.4 fL (ref 80.0–100.0)
Platelets: 202 10*3/uL (ref 150–400)
RBC: 3.07 MIL/uL — ABNORMAL LOW (ref 4.22–5.81)
RDW: 17.8 % — ABNORMAL HIGH (ref 11.5–15.5)
WBC: 14.7 10*3/uL — ABNORMAL HIGH (ref 4.0–10.5)
nRBC: 0 % (ref 0.0–0.2)

## 2021-05-27 LAB — PHOSPHORUS: Phosphorus: 3.1 mg/dL (ref 2.5–4.6)

## 2021-05-27 LAB — T3: T3, Total: 45 ng/dL — ABNORMAL LOW (ref 71–180)

## 2021-05-27 MED ORDER — NEPRO/CARBSTEADY PO LIQD
237.0000 mL | Freq: Three times a day (TID) | ORAL | Status: DC
Start: 2021-05-27 — End: 2021-05-31
  Administered 2021-05-27 – 2021-05-28 (×4): 237 mL via ORAL

## 2021-05-27 NOTE — Progress Notes (Addendum)
Speech Language Pathology Treatment: Dysphagia  ?Patient Details ?Name: [name] ?MRN: [id number] ?DOB: 08-19-1945 ?Today's Date: 05/27/2021 ?Time: 1000-1045 ?SLP Time Calculation (min) (ACUTE ONLY): 45 min ? ?Assessment / Plan / Recommendation ?Clinical Impression ? Pt seen for ongoing assessment of swallowing today; trials to upgrade to an oral diet. Pt awake and requested "water" from [facility]. Pt off HFNC and on [place] O2 support at 7L. Pt appeared weak and required full assistance w/ repositioning in bed d/t discomfort in Left hip(recent surgery to repair s/p fall). Speech intelligibility improved but imprecise d/t decreased breath support and Edentulous status.  ?  ?Chart review remarkable for hx of Dementia, laryngeal cancer, COPD, malnutrition, alcohol abuse, and dysphagia ("florid aspiration") on DG Esophagus in 2020. Suspect chronicity of pt's dysphagia per chart review. Noted previous BSE in 2018 revealing then a baseline of oropharyngeal phase dysphagia. Suspect chronicity of pt's dysphagia per chart review.  ?  ?Oral care provided prior to po's. Pt supported w/ sitting up in bed. PO trials of ice chips, thin and Nectar consistency liquids via cup/straw, and purees were given. Noted immediate/delayed, overt clinical s/s of aspiration noted w/ trials of thin liquids; ice chips. No immediate, overt s/s of aspiration noted w/ trials of Nectar liquids via straw though a Mild throat clearing occurred x2 -- it did not increase in frequency nor intensity as trials continued. Suspect this could be a presentation of his Baseline Dysphagia. No decline in presentation as po trials continued; O2 sats remained 95-96%; RR 20-23.  ?Pt required some encouragement from this Clinician ton continue po trials after initial liquids -- pt only wanted "water".  ?  ?In light of Baseline oropharyngeal phase Dysphagia and other co-morbidities, recommend a trial of Dysphagia level 1 w/ Nectar liquids; general aspiration precautions;  Supervision and monitoring at meals for clinical s/s of aspiration and/or pulmonary decline; support w/ feeding at meals. Reflux precautions.  ?ST services will continue to monitor pt's status w/ ongoing assessment of swallowing(MBSS?) as needed. NSG updated. Precautions posted.  ? ? ? ?  ?HPI HPI: Per 65 H&P "[name] is a 76 y.o. male with medical history significant for liver cirrhosis with history of alcohol abuse and hepatitis C, COPD, dementia with behavioral changes,  BPH dementia, hepatitis C, throat and lung cancer, who presented to the ER with acute onset of left hip pain after having a mechanical fall.  The patient was getting up to urinate before he lost balance and fell.  He denies any presyncope or syncope.  No paresthesias or focal muscle weakness.  No chest pain or palpitations.  No headache or dizziness or blurred vision.  He did not have any head injuries.  No recent cough or wheezing or dyspnea or fever or chills.  No nausea or vomiting or abdominal pain.  No dysuria, oliguria or hematuria or flank pain." On 05/25/21, pt transferred "to progressive for increasing oxygen requirements and heated high flow." CXR "1. New diffuse bilateral airspace opacities suspicious for  multilobar pneumonia, possibly on the basis of aspiration. Other  potential etiologies include asymmetric edema and drug reaction.  2. Grossly stable treated right apical lesion. Evidence of chronic  aspiration in the left lower lobe." ?  ?   ?SLP Plan ? Continue with current plan of care ? ?  ?  ?Recommendations for follow up therapy are one component of a multi-disciplinary discharge planning process, led by the attending physician.  Recommendations may be updated based on patient status, additional functional criteria  and insurance authorization. ?  ? ?Recommendations  ?Diet recommendations: Dysphagia 1 (puree);Nectar-thick liquid ?Liquids provided via: Cup;Straw (monitor) ?Medication Administration: Crushed with  puree ?Supervision: Staff to assist with self feeding;Full supervision/cueing for compensatory strategies ?Compensations: Minimize environmental distractions;Slow rate;Small sips/bites;Lingual sweep for clearance of pocketing;Follow solids with liquid ?Postural Changes and/or Swallow Maneuvers: Out of bed for meals;Seated upright 90 degrees;Upright 30-60 min after meal  ?   ?    ?   ? ? ? ? General recommendations:  (Dietician f/u; Palliative Care f/u) ?Oral Care Recommendations: Oral care BID;Oral care before and after PO;Staff/trained caregiver to provide oral care ?Follow Up Recommendations: Skilled nursing-short term rehab (<3 hours/day) ?Assistance recommended at discharge: Frequent or constant Supervision/Assistance ?SLP Visit Diagnosis: Dysphagia, oropharyngeal phase (R13.12) ?Plan: Continue with current plan of care ? ? ? ? ?  ?  ? ? ? ? ?Orinda Kenner, MS, CCC-SLP ?Speech Language Pathologist ?[facility]; [place] ?[phone] (ascom) ?T[place]un Wold ? ?05/27/2021, 4:35 PM ?

## 2021-05-27 NOTE — Progress Notes (Signed)
Received patient from ICU, assessment completed, VS documented, oriented patient to the room.  Will continue to monitor.  ?

## 2021-05-27 NOTE — Progress Notes (Signed)
Nutrition Follow Up Note  ? ?DOCUMENTATION CODES:  ? ?Non-severe (moderate) malnutrition in context of chronic illness ? ?INTERVENTION:  ? ?Nepro Shake po TID, each supplement provides 425 kcal and 19 grams protein ? ?Magic cup TID with meals, each supplement provides 290 kcal and 9 grams of protein ? ?MVI po daily  ? ?Pt at high refeed risk; recommend monitor potassium, magnesium and phosphorus labs daily until stable ? ?MVI, folic acid and thiamine daily via tube in setting of etoh abuse ? ?NUTRITION DIAGNOSIS:  ? ?Moderate Malnutrition related to chronic illness (liver cirrhosis, h/o ETOH abuse, hepatitis C, COPD, dementia) as evidenced by moderate fat depletion, severe muscle depletion. ? ?GOAL:  ? ?Patient will meet greater than or equal to 90% of their needs ?-not met  ? ?MONITOR:  ? ?Diet advancement, Labs, Weight trends, Skin, I & O's ? ?ASSESSMENT:  ? ?76 y/o male with h/o hypothyroidism, etoh abuse, COPD, anxiety, HCV, cirrhosis, substance abuse, metastatic lung cancer s/o chemo, throat cancer, dementia, gastric ulcer and BPH who is admitted with hip fracture after fall now s/p surgical repair 4/4. ? ?Pt seen by SLP today and initiated on a dysphagia 1/nectar thick diet. RD will add supplements and MVI to help pt meet his estimated needs. No BM noted since 4/8; MD aware. Pt is at high refeed risk. Palliative care consult pending.   ? ?Per chart, pt is down ~12lbs since admission but appears to be back at his UBW.  ? ?Medications reviewed and include: vitamin C, colace, lovenox, ferrous sulfate, folic acid, synthroid, MVI, aldactone, thiamine, vitamin D, ceftriaxone  ? ?Labs reviewed: Na 134(L), K 4.4 wnl, P 3.1 wnl, Mg 1.7 wnl ?Wbc- 14.7(H), Hgb 9.7(L), Hct 29.3(L) ? ?Diet Order:   ?Diet Order   ? ?       ?  DIET - DYS 1 Room service appropriate? Yes with Assist; Fluid consistency: Nectar Thick  Diet effective now       ?  ? ?  ?  ? ?  ? ?EDUCATION NEEDS:  ? ?No education needs have been identified at this  time ? ?Skin:  Skin Assessment: Reviewed RN Assessment ? ?Last BM:  4/8- type 4 ? ?Height:  ? ?Ht Readings from Last 1 Encounters:  ?05/26/2021 5' 6"  (1.676 m)  ? ? ?Weight:  ? ?Wt Readings from Last 1 Encounters:  ?05/26/21 50.6 kg  ? ?BMI:  Body mass index is 18.01 kg/m?. ? ?Estimated Nutritional Needs:  ? ?Kcal:  1700-1900kcal/day ? ?Protein:  85-95g/day ? ?Fluid:  1.3-1.5L/day ? ?Koleen Distance MS, RD, LDN ?Please refer to [facility] for RD and/or RD on-call/weekend/after hours pager ? ?

## 2021-05-27 NOTE — Progress Notes (Signed)
?PROGRESS NOTE ? ? ?HPI was taken from Dr. Sidney Ace: ?[name] is a 76 y.o. male with medical history significant for liver cirrhosis with history of alcohol abuse and hepatitis C, COPD, dementia with behavioral changes,  BPH dementia, hepatitis C, throat and lung cancer, who presented to the ER with acute onset of left hip pain after having a mechanical fall.  The patient was getting up to urinate before he lost balance and fell.  He denies any presyncope or syncope.  No paresthesias or focal muscle weakness.  No chest pain or palpitations.  No headache or dizziness or blurred vision.  He did not have any head injuries.  No recent cough or wheezing or dyspnea or fever or chills.  No nausea or vomiting or abdominal pain.  No dysuria, oliguria or hematuria or flank pain. ?  ?ED Course: When he came to the ER vital signs were within normal.  Labs are currently pending. ?EKG as reviewed by me : EKG showed normal sinus rhythm with a rate of 96 with occasional PVCs and incomplete right bundle blanch block.  Showed prolonged QT interval with QTc of 571 MS. ?Imaging: Portable chest x-ray showed the treated right lung cancer with no acute cardiopulmonary disease. ?Pelvic and left hip x-ray showed acute nondisplaced comminuted and angulated intertrochanteric fracture of the left hip and moderate severe bilateral hip joint osteoarthritis. ? ?Dr. Harlow Mares was notified about the patient.  The patient will be admitted to a medical-surgical bed for further evaluation and management. ? ?As per Dr. Leslye Peer: ?58 year old man with past medical history of COPD, dementia, hepatitis C, cirrhosis and malnutrition and lung cancer presents with a fall and found to have a left hip fracture.  Dr. Harlow Mares brought to the operating room on 06/08/2021. ? ?As per Dr. Jimmye Norman 4/5-4/11/23: Pt is s/p intramedullary nail intertrochanteric as per ortho 05/26/2021. PT/OT recs SNF but CM has been unable to reach pt's guardian. Furthermore, pt was found have  aspiration pneumonia and started on IV ceftriaxone overnight as pt has a penicillin allergy. Pt was transferred overnight to stepdown for respiratory distress secondary to aspiration pneumonia. Pt is on HFNC 7 and will continued to be weaned down as tolerated. Speech evaluated the pt and recommended dysphagia I diet. Orders have been placed to move the pt progressive unit.  ? ? ?[name]  ACZ:[id number] DOB: [birth year] DOA: 06/13/2021 ?PCP: Remi Haggard, FNP ? ?Assessment & Plan: ?  ?Principal Problem: ?  Closed left hip fracture ([place]) ?Active Problems: ?  Long QT interval ?  Obstructive chronic bronchitis without exacerbation ([place]) ?  Hypothyroidism ?  Dementia with behavioral disturbance ([place]) ?  BPH (benign prostatic hyperplasia) ?  Hyponatremia ?  Anemia, unspecified ?  Elevated liver function tests ?  Malnutrition of moderate degree ? ? ?Closed left hip fracture: s/p left intramedullary nail intertrochanteric as per ortho surg on 06/09/2021. PT/OT recs SNF but still unable to reach pt's guardian  ? ?Dysphagia: coughs after eating and drinking. Continue on dysphagia I diet as per speech  ? ?Aspiration pneumonia: continue IV ceftriaxone as pt has a penicillin allergy  ?  ?Long QT interval: hold seroquel  ?  ?Chronic bronchitis: w/o exacerbation. Continue on bronchodilators  ?  ?Hypothyroidism: continue on levothyroxine  ? ?BPH: continue on tamsulosin   ?  ?Dementia: w/ behavioral disturbance. Continue on olanzapine which was changed from seroquel secondary prolonged QT  ? ?Continue on Continue w/ olanzapine which was changed from seroquel secondary to QT prolongation  ? ?  Transaminitis: AST is labile & ALT is WNL  ? ?Thrombocytopenia: resolved  ?  ?Normocytic anemia: w/ likely a component of acute blood loss anemia secondary above recent surg. S/p 1 unit of pRBCs transfused. H&H are labile  ?  ?Hyponatremia: labile.  ? ?Moderate protein-calorie malnutrition: BMI 19.7. Continue on nutritional supplements.  Palliative care consulted  ? ? ?DVT prophylaxis: lovenox  ?Code Status: DNR  ?Family Communication:  ?Disposition Plan:  likely d/c to SNF.  ? ?Level of care: Progressive ?Status is: Inpatient ?Remains inpatient appropriate because:  on IV abxs & oxygen  ? ? ? ?Consultants:  ?Ortho surg  ? ?Procedures:  ? ?Antimicrobials: rocephin  ? ? ?Subjective: ?Pt c/o being cold  ? ?Objective: ?Vitals:  ? 05/27/21 0305 05/27/21 0400 05/27/21 0500 05/27/21 0600  ?BP:  (!) 115/52 (!) 107/55 107/70  ?Pulse:  93 92 92  ?Resp:  19 16 18   ?Temp:  97.8 ?F (36.6 ?C)    ?TempSrc:  Oral    ?SpO2: 95%     ?Weight:      ?Height:      ? ? ?Intake/Output Summary (Last 24 hours) at 05/27/2021 0702 ?Last data filed at 05/27/2021 0600 ?Gross per 24 hour  ?Intake --  ?Output 1100 ml  ?Net -1100 ml  ? ?Filed Weights  ? 06/06/2021 0541 05/19/2021 1142 05/26/21 0000  ?Weight: 55.6 kg 55.6 kg 50.6 kg  ? ? ?Examination: ? ?General exam: Appears comfortable. Frail appearing  ?Respiratory system: course breath sounds b/l  ?Cardiovascular system: S1/S2+. No rubs or gallops   ?Gastrointestinal system: Abd is soft, NT, ND & normal bowel sounds  ?Central nervous system: alert and awake. Moves all extremities  ?Psychiatry: judgement and insight appear at baseline. Appropriate mood and affect ? ? ? ?Data Reviewed: I have personally reviewed following labs and imaging studies ? ?CBC: ?Recent Labs  ?Lab 05/23/21 ?0420 05/24/21 ?3329 05/24/21 ?1626 05/24/21 ?1905 05/25/21 ?5188 05/26/21 ?0106 05/27/21 ?4166  ?WBC 14.6* 11.8*  --   --  12.6* 19.8* 14.7*  ?HGB 7.7* 6.9* 9.9* 8.9* 9.0* 9.8* 9.7*  ?HCT 23.4* 20.3* 29.2* 26.4* 26.6* 28.8* 29.3*  ?MCV 96.3 95.8  --   --  92.7 93.8 95.4  ?PLT 173 161  --   --  173 171 202  ? ?Basic Metabolic Panel: ?Recent Labs  ?Lab 05/23/21 ?0420 05/24/21 ?0630 05/25/21 ?0419 05/26/21 ?0106 05/27/21 ?1601  ?NA 132* 131* 133* 129* 134*  ?K 4.5 4.8 4.5 4.7 4.4  ?CL 101 103 105 101 104  ?CO2 26 24 25 23 23   ?GLUCOSE 111* 94 99 119* 85  ?BUN  35* 36* 33* 34* 36*  ?CREATININE 1.05 0.96 0.99 1.02 0.85  ?CALCIUM 8.5* 8.7* 8.7* 8.9 8.5*  ?MG  --   --   --  1.7  --   ?PHOS  --   --   --   --  3.1  ? ?GFR: ?Estimated Creatinine Clearance: 51.3 mL/min (by C-G formula based on SCr of 0.85 mg/dL). ?Liver Function Tests: ?Recent Labs  ?Lab 05/21/21 ?0932 05/22/21 ?0602 05/23/21 ?3557 05/24/21 ?3220 05/25/21 ?2542  ?AST 46* 44* 45* 43* 44*  ?ALT 38 26 27 25 26   ?ALKPHOS 94 101 97 81 78  ?BILITOT 0.8 0.8 1.0 1.3* 1.6*  ?PROT 5.4* 5.3* 5.6* 5.4* 5.9*  ?ALBUMIN 2.0* 1.9* 2.0* 1.8* 1.9*  ? ?No results for input(s): LIPASE, AMYLASE in the last 168 hours. ?No results for input(s): AMMONIA in the last 168 hours. ?Coagulation Profile: ?  No results for input(s): INR, PROTIME in the last 168 hours. ? ?Cardiac Enzymes: ?No results for input(s): CKTOTAL, CKMB, CKMBINDEX, TROPONINI in the last 168 hours. ?BNP (last 3 results) ?No results for input(s): PROBNP in the last 8760 hours. ?HbA1C: ?No results for input(s): HGBA1C in the last 72 hours. ?CBG: ?Recent Labs  ?Lab 05/25/21 ?2348  ?GLUCAP 116*  ? ?Lipid Profile: ?No results for input(s): CHOL, HDL, LDLCALC, TRIG, CHOLHDL, LDLDIRECT in the last 72 hours. ?Thyroid Function Tests: ?Recent Labs  ?  05/26/21 ?0106 05/26/21 ?0325  ?TSH 12.605*  --   ?FREET4  --  1.10  ? ?Anemia Panel: ?No results for input(s): VITAMINB12, FOLATE, FERRITIN, TIBC, IRON, RETICCTPCT in the last 72 hours. ? ?Sepsis Labs: ?Recent Labs  ?Lab 05/26/21 ?0325  ?PROCALCITON 1.48  ? ? ?Recent Results (from the past 240 hour(s))  ?Resp Panel by RT-PCR (Flu A&B, Covid) Nasopharyngeal Swab     Status: None  ? Collection Time: 05/29/2021  5:57 AM  ? Specimen: Nasopharyngeal Swab; Nasopharyngeal(NP) swabs in vial transport medium  ?Result Value Ref Range Status  ? SARS Coronavirus 2 by RT PCR NEGATIVE NEGATIVE Final  ?  Comment: (NOTE) ?SARS-CoV-2 target nucleic acids are NOT DETECTED. ? ?The SARS-CoV-2 RNA is generally detectable in upper respiratory ?specimens  during the acute phase of infection. The lowest ?concentration of SARS-CoV-2 viral copies this assay can detect is ?138 copies/mL. A negative result does not preclude SARS-Cov-2 ?infection and should not be used

## 2021-05-27 NOTE — Progress Notes (Addendum)
Patient placed on nector thick liquids and pureed diet by speech therapy. Patient excited and eating small amounts. Coughs at times. Sleeping on and off. Given pain medications as requested. Patient refuses to turn every 2 hours and mouth care. No change in status. Oxygen weaned to 3 liters nasal cannula. ?

## 2021-05-27 NOTE — Plan of Care (Signed)
Consult noted for [place]. Patient has a DSS guardian. Attempted to reach Orion Modest however her VM directed me to call Kylie Marrow. VM left for Kylie to return call to our team phone number.  ?

## 2021-05-28 ENCOUNTER — Encounter: Payer: Self-pay | Admitting: Family Medicine

## 2021-05-28 DIAGNOSIS — F03918 Unspecified dementia, unspecified severity, with other behavioral disturbance: Secondary | ICD-10-CM | POA: Diagnosis not present

## 2021-05-28 DIAGNOSIS — S72002A Fracture of unspecified part of neck of left femur, initial encounter for closed fracture: Secondary | ICD-10-CM | POA: Diagnosis not present

## 2021-05-28 LAB — CBC
HCT: 29.9 % — ABNORMAL LOW (ref 39.0–52.0)
Hemoglobin: 9.7 g/dL — ABNORMAL LOW (ref 13.0–17.0)
MCH: 31.4 pg (ref 26.0–34.0)
MCHC: 32.4 g/dL (ref 30.0–36.0)
MCV: 96.8 fL (ref 80.0–100.0)
Platelets: 182 10*3/uL (ref 150–400)
RBC: 3.09 MIL/uL — ABNORMAL LOW (ref 4.22–5.81)
RDW: 18 % — ABNORMAL HIGH (ref 11.5–15.5)
WBC: 16.7 10*3/uL — ABNORMAL HIGH (ref 4.0–10.5)
nRBC: 0 % (ref 0.0–0.2)

## 2021-05-28 LAB — BASIC METABOLIC PANEL
Anion gap: 4 — ABNORMAL LOW (ref 5–15)
BUN: 45 mg/dL — ABNORMAL HIGH (ref 8–23)
CO2: 24 mmol/L (ref 22–32)
Calcium: 8.8 mg/dL — ABNORMAL LOW (ref 8.9–10.3)
Chloride: 107 mmol/L (ref 98–111)
Creatinine, Ser: 1.06 mg/dL (ref 0.61–1.24)
GFR, Estimated: >60 mL/min (ref >60)
Glucose, Bld: 120 mg/dL — ABNORMAL HIGH (ref 70–99)
Potassium: 4.1 mmol/L (ref 3.5–5.1)
Sodium: 135 mmol/L (ref 135–145)

## 2021-05-28 MED ORDER — ACETAMINOPHEN 650 MG RE SUPP: 650.0000 mg | RECTAL | Status: DC | PRN

## 2021-05-28 MED ORDER — ACETAMINOPHEN 325 MG PO TABS
650.0000 mg | ORAL_TABLET | ORAL | Status: DC | PRN
Start: 2021-05-28 — End: 2021-05-31

## 2021-05-28 MED ORDER — HALOPERIDOL LACTATE 5 MG/ML IJ SOLN
2.0000 mg | Freq: Four times a day (QID) | INTRAMUSCULAR | Status: DC | PRN
  Administered 2021-05-28 – 2021-06-01 (×7): 2 mg via INTRAMUSCULAR
  Filled 2021-05-28 (×8): qty 1

## 2021-05-28 MED ORDER — DEXTROSE IN LACTATED RINGERS 5 % IV SOLN: INTRAVENOUS | Status: AC

## 2021-05-28 NOTE — Progress Notes (Addendum)
? ? ? ?Progress Note  ? ? ?[name]  WLS:[id number] DOB: 07-27-1945  DOA: 05/24/2021 ?PCP: Remi Haggard, FNP  ? ? ? ? ?Brief Narrative:  ? ? ?Medical records reviewed and are as summarized below: ? ? ?76 year old man with past medical history of COPD, dementia, hepatitis C, cirrhosis and malnutrition and lung cancer presents with a fall and found to have a left hip fracture.  Dr. Harlow Mares brought to the operating room on 05/18/2021. ? ? ? ? ? ?Assessment/Plan:  ? ?Principal Problem: ?  Closed left hip fracture ([place]) ?Active Problems: ?  Long QT interval ?  Obstructive chronic bronchitis without exacerbation ([place]) ?  Hypothyroidism ?  Dementia with behavioral disturbance ([place]) ?  BPH (benign prostatic hyperplasia) ?  Hyponatremia ?  Anemia, unspecified ?  Elevated liver function tests ?  Malnutrition of moderate degree ? ? ?Nutrition Problem: Moderate Malnutrition ?Etiology: chronic illness (liver cirrhosis, h/o ETOH abuse, hepatitis C, COPD, dementia) ? ?Signs/Symptoms: moderate fat depletion, severe muscle depletion ? ? ?Body mass index is 18.01 kg/m?. ? ? ? ? ?Closed left hip fracture: S/p left intramedullary nail intertrochanteric surgery on 06/11/2021.  Continue PT and OT.  Analgesics as needed for pain. ? ?Aspiration pneumonia: Continue IV ceftriaxone ? ?Dysphagia: Case discussed with Curt Bears, speech therapist.  She recommended n.p.o. with plan for modified barium swallow tomorrow.  Start low rate IV fluids for hydration. ? 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 Grayland Ormond for chemotherapy. ? ?Dementia with behavioral disturbance: Continue olanzapine ? ?Prolonged QTc interval: Improved from 571 to 495.  Of note, olanzapine had been substituted with Seroquel. ? ?Hyponatremia, thrombocytopenia: Improved ? ?Other comorbidities include liver cirrhosis, COPD/chronic bronchitis, hypothyroidism, BPH, anemia of chronic disease ? ?Follow-up with palliative  care team ? ?Diet Order   ? ?       ?  Diet NPO time specified  Diet effective now       ?  ? ?  ?  ? ?  ? ? ? ? ? ? ? ? ? ? ? ? ?Consultants: ?Palliative care ? ?Procedures: ?None ? ? ? ?Medications:  ? ? vitamin C  500 mg Oral Daily  ? buPROPion  150 mg Oral Daily  ? chlorhexidine  15 mL Mouth Rinse BID  ? Chlorhexidine Gluconate Cloth  6 each Topical Daily  ? docusate sodium  100 mg Oral BID  ? enoxaparin (LOVENOX) injection  40 mg Subcutaneous Q24H  ? feeding supplement (NEPRO CARB STEADY)  237 mL Oral TID BM  ? ferrous sulfate  325 mg Oral Q breakfast  ? folic acid  1 mg Oral Daily  ? hydrOXYzine  50 mg Oral QHS  ? levothyroxine  150 mcg Oral Q0600  ? mouth rinse  15 mL Mouth Rinse q12n4p  ? metoprolol succinate  12.5 mg Oral QHS  ? multivitamin with minerals  1 tablet Oral Daily  ? OLANZapine  15 mg Oral QHS  ? OLANZapine  2.5 mg Oral BID AC  ? spironolactone  50 mg Oral Daily  ? tamsulosin  0.8 mg Oral QPC breakfast  ? thiamine  100 mg Oral Daily  ? umeclidinium bromide  1 puff Inhalation Daily  ? Vitamin D (Ergocalciferol)  50,000 Units Oral Q7 days  ? ?Continuous Infusions: ? cefTRIAXone (ROCEPHIN)  IV Stopped (05/28/21 0700)  ? dextrose 5% lactated ringers    ? ? ? ?Anti-infectives (From admission, onward)  ? ? Start  Dose/Rate Route Frequency Ordered Stop  ? 05/25/21 2230  cefTRIAXone (ROCEPHIN) 1 g in sodium chloride 0.9 % 100 mL IVPB       ? 1 g ?200 mL/hr over 30 Minutes Intravenous Every 24 hours 05/25/21 2141 05/31/21 0156  ? 05/26/2021 1800  ceFAZolin (ANCEF) IVPB 1 g/50 mL premix       ? 1 g ?100 mL/hr over 30 Minutes Intravenous Every 6 hours 05/17/2021 1407 05/21/21 0621  ? 05/21/2021 1143  ceFAZolin (ANCEF) 2-4 GM/100ML-% IVPB       ?Note to Pharmacy: Olena Mater F: cabinet override  ?    06/07/2021 1143 06/13/2021 1235  ? 06/11/2021 0724  ceFAZolin (ANCEF) IVPB 2g/100 mL premix       ? 2 g ?200 mL/hr over 30 Minutes Intravenous 30 min pre-op 05/23/2021 0724 06/05/2021 1218  ? ?   ? ? ? ? ? ? ? ? ? ?Family Communication/Anticipated D/C date and plan/Code Status  ? ?DVT prophylaxis: enoxaparin (LOVENOX) injection 40 mg Start: 05/21/21 0800 ?SCDs Start: 05/28/2021 1408 ?SCDs Start: 06/15/2021 0542 ? ? ?  Code Status: DNR ? ?Family Communication: None ?Disposition Plan: Plan to discharge to SNF when medically stable ? ? ?Status is: Inpatient ?Remains inpatient appropriate because: Dysphagia, n.p.o. status ? ? ? ? ? ? ?Subjective:  ? ?Interval events noted.  He is confused and unable to provide any history. ? ?Objective:  ? ? ?Vitals:  ? 05/28/21 0002 05/28/21 0500 05/28/21 0715 05/28/21 1112  ?BP: 129/61 (!) 104/56 96/60 (!) 119/59  ?Pulse: (!) 110 92 92 87  ?Resp: 20 18 19 19   ?Temp: 98.1 ?F (36.7 ?C) 98.6 ?F (37 ?C) 97.9 ?F (36.6 ?C) 97.8 ?F (36.6 ?C)  ?TempSrc:      ?SpO2: 92% 100% 98% 90%  ?Weight:      ?Height:      ? ?No data found. ? ? ?Intake/Output Summary (Last 24 hours) at 05/28/2021 1634 ?Last data filed at 05/28/2021 1113 ?Gross per 24 hour  ?Intake 420.42 ml  ?Output 1600 ml  ?Net -1179.58 ml  ? ?Filed Weights  ? 05/24/2021 0541 06/10/2021 1142 05/26/21 0000  ?Weight: 55.6 kg 55.6 kg 50.6 kg  ? ? ?Exam: ? ?GEN: NAD ?SKIN: Warm and dry ?EYES: EOMI ?ENT: MMM ?CV: RRR ?PULM: CTA B ?ABD: soft, ND, NT, +BS ?CNS: AAO x 1 (person), non focal ?EXT: No edema or tenderness ? ? ? ?  ? ? ?Data Reviewed:  ? ?I have personally reviewed following labs and imaging studies: ? ?Labs: ?Labs show the following:  ? ?Basic Metabolic Panel: ?Recent Labs  ?Lab 05/24/21 ?3976 05/25/21 ?0419 05/26/21 ?0106 05/27/21 ?7341 05/28/21 ?0617  ?NA 131* 133* 129* 134* 135  ?K 4.8 4.5 4.7 4.4 4.1  ?CL 103 105 101 104 107  ?CO2 24 25 23 23 24   ?GLUCOSE 94 99 119* 85 120*  ?BUN 36* 33* 34* 36* 45*  ?CREATININE 0.96 0.99 1.02 0.85 1.06  ?CALCIUM 8.7* 8.7* 8.9 8.5* 8.8*  ?MG  --   --  1.7  --   --   ?PHOS  --   --   --  3.1  --   ? ?GFR ?Estimated Creatinine Clearance: 41.1 mL/min (by C-G formula based on SCr of 1.06  mg/dL). ?Liver Function Tests: ?Recent Labs  ?Lab 05/22/21 ?0602 05/23/21 ?0420 05/24/21 ?9379 05/25/21 ?0419  ?AST 44* 45* 43* 44*  ?ALT 26 27 25 26   ?ALKPHOS 101 97 81 78  ?BILITOT 0.8 1.0 1.3*  1.6*  ?PROT 5.3* 5.6* 5.4* 5.9*  ?ALBUMIN 1.9* 2.0* 1.8* 1.9*  ? ?No results for input(s): LIPASE, AMYLASE in the last 168 hours. ?No results for input(s): AMMONIA in the last 168 hours. ?Coagulation profile ?No results for input(s): INR, PROTIME in the last 168 hours. ? ?CBC: ?Recent Labs  ?Lab 05/24/21 ?1916 05/24/21 ?1626 05/24/21 ?1905 05/25/21 ?6060 05/26/21 ?0106 05/27/21 ?0459 05/28/21 ?9774  ?WBC 11.8*  --   --  12.6* 19.8* 14.7* 16.7*  ?HGB 6.9*   < > 8.9* 9.0* 9.8* 9.7* 9.7*  ?HCT 20.3*   < > 26.4* 26.6* 28.8* 29.3* 29.9*  ?MCV 95.8  --   --  92.7 93.8 95.4 96.8  ?PLT 161  --   --  173 171 202 182  ? < > = values in this interval not displayed.  ? ?Cardiac Enzymes: ?No results for input(s): CKTOTAL, CKMB, CKMBINDEX, TROPONINI in the last 168 hours. ?BNP (last 3 results) ?No results for input(s): PROBNP in the last 8760 hours. ?CBG: ?Recent Labs  ?Lab 05/25/21 ?2348  ?GLUCAP 116*  ? ?D-Dimer: ?Recent Labs  ?  05/26/21 ?0106  ?DDIMER 2.12*  ? ?Hgb A1c: ?No results for input(s): HGBA1C in the last 72 hours. ?Lipid Profile: ?No results for input(s): CHOL, HDL, LDLCALC, TRIG, CHOLHDL, LDLDIRECT in the last 72 hours. ?Thyroid function studies: ?Recent Labs  ?  05/26/21 ?0106  ?TSH 12.605*  ? ?Anemia work up: ?No results for input(s): VITAMINB12, FOLATE, FERRITIN, TIBC, IRON, RETICCTPCT in the last 72 hours. ?Sepsis Labs: ?Recent Labs  ?Lab 05/25/21 ?0419 05/26/21 ?0106 05/26/21 ?0325 05/27/21 ?1423 05/28/21 ?0617  ?PROCALCITON  --   --  1.48  --   --   ?WBC 12.6* 19.8*  --  14.7* 16.7*  ? ? ?Microbiology ?Recent Results (from the past 240 hour(s))  ?Resp Panel by RT-PCR (Flu A&B, Covid) Nasopharyngeal Swab     Status: None  ? Collection Time: 05/27/2021  5:57 AM  ? Specimen: Nasopharyngeal Swab; Nasopharyngeal(NP) swabs  in vial transport medium  ?Result Value Ref Range Status  ? SARS Coronavirus 2 by RT PCR NEGATIVE NEGATIVE Final  ?  Comment: (NOTE) ?SARS-CoV-2 target nucleic acids are NOT DETECTED. ? ?The SARS-CoV-2 RNA is generally detectable in uppe

## 2021-05-28 NOTE — TOC Progression Note (Signed)
Transition of Care (TOC) - Progression Note  ? ? ?Patient Details  ?Name: Tyheim [name] ?MRN: [id number] ?Date of Birth: Mar 19, 1945 ? ?Transition of Care (TOC) CM/SW Contact  ?Candie Chroman, LCSW ?Phone Number: ?05/28/2021, 10:33 AM ? ?Clinical Narrative: Emailed [name], DSS supervisor, asking her to call palliative provider for [place] discussion.   ? ?Expected Discharge Plan: [place] ?Barriers to Discharge: Continued Medical Work up ? ?Expected Discharge Plan and Services ?Expected Discharge Plan: [place] ?  ?Discharge Planning Services: CM Consult ?Post Acute Care Choice: [place] ?Living arrangements for the past 2 months: Group Home ?                ?DME Arranged: N/A ?DME Agency: NA ?  ?  ?  ?HH Arranged: NA ?[place] Agency: NA ?  ?  ?  ? ? ?Social Determinants of Health (SDOH) Interventions ?  ? ?Readmission Risk Interventions ?   ? View : No data to display.  ?  ?  ?  ? ? ?

## 2021-05-28 NOTE — Progress Notes (Signed)
Speech Language Pathology Treatment: Dysphagia  ?Patient Details ?Name: [name] ?MRN: [id number] ?DOB: 07-22-1945 ?Today's Date: 05/28/2021 ?Time: 6503-5465 ?SLP Time Calculation (min) (ACUTE ONLY): 40 min ? ?Assessment / Plan / Recommendation ?Clinical Impression ? Pt seen for ongoing assessment of swallowing today; toleration of oral diet. Trials given at bedside.  ?Pt awake and responded to direct questions; no overly verbal. Pt remains on [place] O2 support at 3-7L. Pt appeared weak and required full assistance w/ repositioning in bed d/t discomfort in Left hip(recent surgery to repair s/p fall). Speech intelligibility improved but imprecise d/t decreased breath support and Edentulous status.  ?  ?Chart review remarkable for hx of Dementia, laryngeal cancer, COPD, malnutrition, and dysphagia ("florid aspiration") on DG Esophagus in 2020. Per previous BSE in 2018, oropharyngeal phase dysphagia was noted then. Suspect chronicity of pt's dysphagia per chart review.  ?  ?Oral care provided prior to po's. Pt supported w/ sitting up in bed. Noted [place] O2 was off; he stated he "didn't have to wear that" but it was replaced by this Clinician. PO trials of Nectar consistency liquids via straw and purees were given. Delayed, but overt, s/s of aspiration noted w/ trials c/b delayed mild throat clearing and mild cough x2. Also noted were multiple swallows(~2) w/ trials and a mild wet respiratory/vocal quality b/t trials x3. Alternating consistencies did not appear to impact/improve this pharyngeal presentation. Suspect this could be a presentation of his Baseline Dysphagia.  ?Pt only accepted ~4 -5 trials of each consistency w/ encouragement from this Clinician; then he refused further. ? ?Discussion w/ MD post session re: the suspected pharyngeal phase dysphagia, pt's h/o of Dysphagia, and current POC. Recommended a MBSS for objective assessment/information to help guide POC moving forward re: least restrictive, safest diet as  possible. MD agreed. MBSS order placed. Pt made NPO, and NSG updated.  ?Recommend frequent oral care for hygiene and stimulation of swallowing; aspiration precautions. ST services will f/u tomorrow.  ? ? ?  ?HPI HPI: Per 8 H&P "[name] is a 76 y.o. male with medical history significant for liver cirrhosis with history of alcohol abuse and hepatitis C, COPD, dementia with behavioral changes,  BPH dementia, hepatitis C, throat and lung cancer, who presented to the ER with acute onset of left hip pain after having a mechanical fall.  The patient was getting up to urinate before he lost balance and fell.  He denies any presyncope or syncope.  No paresthesias or focal muscle weakness.  No chest pain or palpitations.  No headache or dizziness or blurred vision.  He did not have any head injuries.  No recent cough or wheezing or dyspnea or fever or chills.  No nausea or vomiting or abdominal pain.  No dysuria, oliguria or hematuria or flank pain." On 05/25/21, pt transferred "to progressive for increasing oxygen requirements and heated high flow." CXR "1. New diffuse bilateral airspace opacities suspicious for  multilobar pneumonia, possibly on the basis of aspiration. Other  potential etiologies include asymmetric edema and drug reaction.  2. Grossly stable treated right apical lesion. Evidence of chronic  aspiration in the left lower lobe." ?  ?   ?SLP Plan ? Continue with current plan of care;New goals to be determined pending instrumental study ? ?  ?  ?Recommendations for follow up therapy are one component of a multi-disciplinary discharge planning process, led by the attending physician.  Recommendations may be updated based on patient status, additional functional criteria and insurance authorization. ?  ? ?  Recommendations  ?Diet recommendations: NPO ?Medication Administration: Via alternative means  ?   ?    ?   ? ? ? ? General recommendations:  (Palliative Care; Dietician) ?Oral Care  Recommendations: Oral care QID;Staff/trained caregiver to provide oral care ?Follow Up Recommendations: Skilled nursing-short term rehab (<3 hours/day) ?Assistance recommended at discharge: Frequent or constant Supervision/Assistance ?SLP Visit Diagnosis: Dysphagia, oropharyngeal phase (R13.12) (baseline Dementia) ?Plan: Continue with current plan of care;New goals to be determined pending instrumental study ? ? ? ? ?  ?  ? ? ? ? ?Orinda Kenner, MS, CCC-SLP ?Speech Language Pathologist ?Rehab Services; [place] ?[phone] (ascom) ?Margaret Cockerill ? ?05/28/2021, 4:59 PM ?

## 2021-05-29 ENCOUNTER — Inpatient Hospital Stay: Payer: Medicare Other

## 2021-05-29 DIAGNOSIS — F03918 Unspecified dementia, unspecified severity, with other behavioral disturbance: Secondary | ICD-10-CM | POA: Diagnosis not present

## 2021-05-29 DIAGNOSIS — Z515 Encounter for palliative care: Secondary | ICD-10-CM

## 2021-05-29 DIAGNOSIS — M25552 Pain in left hip: Secondary | ICD-10-CM

## 2021-05-29 DIAGNOSIS — J9601 Acute respiratory failure with hypoxia: Secondary | ICD-10-CM | POA: Diagnosis not present

## 2021-05-29 DIAGNOSIS — E440 Moderate protein-calorie malnutrition: Secondary | ICD-10-CM | POA: Diagnosis not present

## 2021-05-29 DIAGNOSIS — J690 Pneumonitis due to inhalation of food and vomit: Secondary | ICD-10-CM | POA: Diagnosis not present

## 2021-05-29 DIAGNOSIS — S72002A Fracture of unspecified part of neck of left femur, initial encounter for closed fracture: Secondary | ICD-10-CM | POA: Diagnosis not present

## 2021-05-29 NOTE — TOC Progression Note (Signed)
Transition of Care (TOC) - Progression Note  ? ? ?Patient Details  ?Name: Nazim [name] ?MRN: [id number] ?Date of Birth: 12-Dec-1945 ? ?Transition of Care (TOC) CM/SW Contact  ?Candie Chroman, LCSW ?Phone Number: ?05/29/2021, 1:24 PM ? ?Clinical Narrative:   DSS did not call palliative yesterday. Emailed [place] asking if she would call today. She emailed back stating she would. ? ?Expected Discharge Plan: [place] ?Barriers to Discharge: Continued Medical Work up ? ?Expected Discharge Plan and Services ?Expected Discharge Plan: [place] ?  ?Discharge Planning Services: CM Consult ?Post Acute Care Choice: [place] ?Living arrangements for the past 2 months: Group Home ?                ?DME Arranged: N/A ?DME Agency: NA ?  ?  ?  ?HH Arranged: NA ?[place] Agency: NA ?  ?  ?  ? ? ?Social Determinants of Health (SDOH) Interventions ?  ? ?Readmission Risk Interventions ?   ? View : No data to display.  ?  ?  ?  ? ? ?

## 2021-05-29 NOTE — Plan of Care (Signed)

## 2021-05-29 NOTE — Procedures (Addendum)
Objective Swallowing Evaluation: Type of Study: MBS-Modified Barium Swallow Study ?  ?Patient Details  ?Name: [name] ?MRN: [id number] ?Date of Birth: 03/05/1945 ? ?Today's Date: 05/29/2021 ?Time: SLP Start Time (ACUTE ONLY): 0800 ?-SLP Stop Time (ACUTE ONLY): 0840 ? ?SLP Time Calculation (min) (ACUTE ONLY): 40 min ? ? ?Past Medical History:  ?Past Medical History:  ?Diagnosis Date  ? Alcohol abuse   ? Cirrhosis ([place])   ? COPD (chronic obstructive pulmonary disease) ([place])   ? Dementia ([place])   ? Hepatitis C   ? Malnutrition ([place])   ? Throat cancer ([place])   ? Lung Cancer (presently)  History of throat cancer  ? ?Past Surgical History:  ?Past Surgical History:  ?Procedure Laterality Date  ? ESOPHAGOGASTRODUODENOSCOPY (EGD) WITH PROPOFOL N/A 08/03/2019  ? Procedure: ESOPHAGOGASTRODUODENOSCOPY (EGD) WITH PROPOFOL;  Surgeon: Lin Landsman, MD;  Location: [facility] ENDOSCOPY;  Service: Gastroenterology;  Laterality: N/A;  ? INTRAMEDULLARY (IM) NAIL INTERTROCHANTERIC Left 05/24/2021  ? Procedure: INTRAMEDULLARY (IM) NAIL INTERTROCHANTRIC;  Surgeon: Lovell Sheehan, MD;  Location: [facility] ORS;  Service: Orthopedics;  Laterality: Left;  ? IR IMAGING GUIDED PORT INSERTION  04/28/2019  ? TONSILLECTOMY    ? age was 64  ? VASECTOMY    ? ?HPI: Per 44 H&P "[name] is a 76 y.o. male with medical history significant for liver cirrhosis with history of alcohol abuse and hepatitis C, COPD, dementia with behavioral changes,  BPH dementia, hepatitis C, throat and lung cancer, who presented to the ER with acute onset of left hip pain after having a mechanical fall.  The patient was getting up to urinate before he lost balance and fell.  He denies any presyncope or syncope.  No paresthesias or focal muscle weakness.  No chest pain or palpitations.  No headache or dizziness or blurred vision.  He did not have any head injuries.  No recent cough or wheezing or dyspnea or fever or chills.  No nausea or vomiting or abdominal pain.   No dysuria, oliguria or hematuria or flank pain." On 05/25/21, pt transferred "to progressive for increasing oxygen requirements and heated high flow." CXR "1. New diffuse bilateral airspace opacities suspicious for  multilobar pneumonia, possibly on the basis of aspiration. Other  potential etiologies include asymmetric edema and drug reaction.  2. Grossly stable treated right apical lesion. Evidence of chronic  aspiration in the left lower lobe." ?  ?Subjective: Pt alert, pleasantly confused. Restless at times. B mitts donned upon SLP entrance to fluoro suite for duration of MBSS. On 3L/min O2 via [place]. ? ? ? Recommendations for follow up therapy are one component of a multi-disciplinary discharge planning process, led by the attending physician.  Recommendations may be updated based on patient status, additional functional criteria and insurance authorization. ? ?Assessment / Plan / Recommendation ? ? ?  05/29/2021  ?  9:00 AM  ?Clinical Impressions  ?Clinical Impression Pt presents with a moderate oral and a severe pharyngeal dysphagia with likely concomittant esophageal dysphagia. Suspect chronicity of pt's dysphagia given results fo DG esophagus in 2020 and setting of hx of laryngeal cancer, COPD, and dementia.  ? ?Pt given trials of thin liquids (via tsp, cup sip), nectar-thick liquids (via tsp, cup sip), and pudding. Solid trials deferred due to risk of airway obstruction given severity of pt's pharyngeal dysphagia. Oral phase notable for piecemeal swallow and oral residual across all consistencies which increased with viscocity as well as anterior loss of pudduing. Pharyngeal phase notable for swallow initiation at  the level of the pyriform sinus across all textures, mild-severe vallecular status, mild-moderate pyriform sinus stasis, trace-mild posterior pharyngeal wall stasis, and penetration and audible aspiration across all textures. Spontaneous cough did not clear material from trachea. Residual increased  with visocity. Spontaneous and cued secondary swallows oftern resulted in aspiration. With pudding, pt with retrograde bolus flow of vallecular residual from pharynx to oral cavity multiple times. Pt unable to fully clear. Above mentioned deficits secondary to: lingual weakness/incoordination, decreased base of tongue retraction, reduced laryngeal elevation, reduced/absent epiglottic inversion, reduced pharyngeal stripping, and mistimed laryngeal vestibule closure. Esophageal phase noteable for seemingly reduced duration/amplitude of UES opening during the swallow. See below for additional details of pt's swallowing physiology. ? ?At present, a safe oral diet cannot be recommended. Recommend NPO with GOC discussion for consideration of comfort feedings vs. long term alternate means of nutrition.  ? ?Pt made aware of results of MBSS and current SLP recommendations. ?understanding by pt given confusion. RN and MD made aware of results, recommendations, and SLP POC. ? ?SLP to sign off as pt has no acute SLP needs.  ? ?   ? ?  05/29/2021  ?  8:57 AM  ?Treatment Recommendations  ?Treatment Recommendations No treatment recommended at this time  ?   ? ?  05/29/2021  ?  8:59 AM  ?Prognosis  ?Prognosis for Safe Diet Advancement Guarded  ?Barriers to Reach Goals Time post onset;Severity of deficits;Behavior  ? ? ? ?  05/29/2021  ?  8:57 AM  ?Diet Recommendations  ?SLP Diet Recommendations NPO  ?Medication Administration Via alternative means  ?Compensations --  ?Postural Changes --  ?   ? ? ?  05/29/2021  ?  8:57 AM  ?Other Recommendations  ?Oral Care Recommendations Oral care QID;Staff/trained caregiver to provide oral care  ?Other Recommendations Remove water pitcher  ?Follow Up Recommendations Follow physician's recommendations for discharge plan and follow up therapies  ?Assistance recommended at discharge Frequent or constant Supervision/Assistance  ?Functional Status Assessment Patient has had a recent decline in their  functional status and/or demonstrates limited ability to make significant improvements in function in a reasonable and predictable amount of time  ? ? ? ?  05/26/2021  ?  9:08 AM  ?Frequency and Duration   ?Speech Therapy Frequency (ACUTE ONLY) min 2x/week  ?Treatment Duration 2 weeks  ?   ? ? ?  05/29/2021  ?  8:48 AM  ?Oral Phase  ?Oral Phase Impaired  ?Oral - Nectar Teaspoon Piecemeal swallowing;Lingual/palatal residue  ?Oral - Nectar Cup Lingual/palatal residue;Piecemeal swallowing  ?Oral - Thin Teaspoon Piecemeal swallowing;Lingual/palatal residue  ?Oral - Thin Cup Piecemeal swallowing;Lingual/palatal residue  ?Oral - Puree Lingual/palatal residue;Piecemeal swallowing; Retrograde bolus flow of vallecular residual from pharynx to oral cavity multiple times. Pt unable to fully clear.  ?  ? ?  05/29/2021  ?  8:50 AM  ?Pharyngeal Phase  ?Pharyngeal Phase Impaired  ?Pharyngeal- Pudding Teaspoon Delayed swallow initiation-pyriform sinuses;Reduced pharyngeal peristalsis;Reduced epiglottic inversion;Reduced airway/laryngeal closure;Reduced tongue base retraction;Penetration/Aspiration during swallow;Penetration/Apiration after swallow;Moderate aspiration;Pharyngeal residue - valleculae;Pharyngeal residue - pyriform;Pharyngeal residue - posterior pharnyx;Reduced laryngeal elevation; Retrograde bolus flow of vallecular residual from pharynx to oral cavity multiple times. Pt unable to fully clear.  ?Pharyngeal Material enters airway, remains ABOVE vocal cords and not ejected out;Material enters airway, passes BELOW cords and not ejected out despite cough attempt by patient *aspiration of pudding not captured directly under fluoroscopy - noted between frames and was accompanied by weak coughing  ?Pharyngeal- Nectar Teaspoon Delayed  swallow initiation-pyriform sinuses;Reduced pharyngeal peristalsis;Reduced epiglottic inversion;Reduced laryngeal elevation;Reduced airway/laryngeal closure;Reduced tongue base  retraction;Penetration/Aspiration before swallow;Penetration/Apiration after swallow;Significant aspiration (Amount);Pharyngeal residue - valleculae;Pharyngeal residue - pyriform;Pharyngeal residue - posterior pharnyx  ?Pharyn

## 2021-05-29 NOTE — Progress Notes (Signed)
Nutrition Follow-up ? ?DOCUMENTATION CODES:  ? ?Non-severe (moderate) malnutrition in context of chronic illness ? ?INTERVENTION:  ? ?-RD will follow for diet advancement and make further recommendations pending goals of care ?-If pt desires aggressive care, recommend initiation of enteral nutrition: ? ?Initiate Osmolite 1.5 @ 20 ml/hr and increase by 10 ml every 12 hours to goal rate of 50 ml/hr.  ? ?45 ml Prosource TF daily.   ? ?200 ml free water flush every 6 hours ? ?Tube feeding regimen provides 1850 kcal (100% of needs), 84 grams of protein, and 914 ml of H2O. Total free water: 1714 ml daily  ? ?-If feedings are initiated, monitor Mg, K, and Phos daily and replete as needed secondary to refeeding risk ? ?NUTRITION DIAGNOSIS:  ? ?Moderate Malnutrition related to chronic illness (liver cirrhosis, h/o ETOH abuse, hepatitis C, COPD, dementia) as evidenced by moderate fat depletion, severe muscle depletion. ? ?Ongoing ? ?GOAL:  ? ?Patient will meet greater than or equal to 90% of their needs ? ?Unmet ? ?MONITOR:  ? ?Diet advancement, Labs, Weight trends, Skin, I & O's ? ?REASON FOR ASSESSMENT:  ? ?Consult ?Hip fracture protocol ? ?ASSESSMENT:  ? ?76 y/o male with h/o hypothyroidism, etoh abuse, COPD, anxiety, HCV, cirrhosis, substance abuse, metastatic lung cancer s/o chemo, throat cancer, dementia, gastric ulcer and BPH who is admitted with hip fracture after fall now s/p surgical repair 4/4. ? ?4/12- s/p BSE- recommend NPO ?4/13- s/p MBSS- recommend NPO ? ?Reviewed I/O's: -1.3 L x 24 hours and +558 ml since admission ? ?UOP: 1.7 L x 24 hours ?  ?Case discussed with MD, Palliative Care, and SLP. Pt failed MBSS today. DSS plan to discuss [place] with palliative care team. If aggressive care is warranted, pt will require alternative means of nutrition and hydration (NGT vs PEG).  ? ?Medications reviewed and include colace, ferrous sulfate, aldactone, thiamine, and vitamin D.  ? ?Labs reviewed: CBGS: 116 (inpatient  orders for glycemic control are none).   ? ?Diet Order:   ?Diet Order   ? ?       ?  Diet NPO time specified  Diet effective now       ?  ? ?  ?  ? ?  ? ? ?EDUCATION NEEDS:  ? ?No education needs have been identified at this time ? ?Skin:  Skin Assessment: Skin Integrity Issues: ?Skin Integrity Issues:: Incisions ?Incisions: closed thigh ? ?Last BM:  05/28/21 ? ?Height:  ? ?Ht Readings from Last 1 Encounters:  ?06/01/2021 5\' 6"  (1.676 m)  ? ? ?Weight:  ? ?Wt Readings from Last 1 Encounters:  ?05/26/21 50.6 kg  ? ?BMI:  Body mass index is 18.01 kg/m?. ? ?Estimated Nutritional Needs:  ? ?Kcal:  1700-1900kcal/day ? ?Protein:  85-95g/day ? ?Fluid:  > 1.7 L ? ? ? ?Loistine Chance, RD, LDN, CDCES ?Registered Dietitian II ?Certified Diabetes Care and Education Specialist ?Please refer to [facility] for RD and/or RD on-call/weekend/after hours pager  ?

## 2021-05-29 NOTE — Consult Note (Signed)
? ?                                                                                ?Consultation Note ?Date: 05/29/2021  ? ?Patient Name: [name]  ?DOB: Jul 18, 1945  MRN: [id number]  Age / Sex: 76 y.o., male  ?PCP: Remi Haggard, FNP ?Referring Physician: Jennye Boroughs, MD ? ?Reason for Consultation: Establishing goals of care and Psychosocial/spiritual support ? ?HPI/Patient Profile: 76 y.o. male  admitted on 06/10/2021 with  ?past medical history significant for liver cirrhosis with history of alcohol abuse and hepatitis C, COPD, dementia with behavioral changes,  BPH dementia, hepatitis C, throat and lung cancer, who presented to the ER with acute onset of left hip pain after having a mechanical fall.  ? ?Admitted for treatment and stabilization. S/p repair 05/17/2021   He has continued with confusion, weakness and dysphagia.  Unable to participate in PT ? 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 PD-L1 10%, with high tumor mutational burden.  His sister was unaware of this diagnosis.  Unsure who is assisting [name] with decisions related to his cancer and it treatment options. ?  ?Patient lives in a group home.  He is under guardianship with DSS ? ?  ?ED Course:  ? ?Imaging: Portable chest x-ray showed the treated right lung cancer with no acute cardiopulmonary disease. ?Pelvic and left hip x-ray showed acute nondisplaced comminuted and angulated intertrochanteric fracture of the left hip and moderate severe bilateral hip joint osteoarthritis. ? ? The patient admitted to a medical-surgical bed for further evaluation and management. ? ?Today is day 9 of this hospital stay. Patient remains confused and unable to support himself nutriionally ? ?Clinical Assessment and Goals of Care: ? ?Attempted to contact DSS for several days along with assistance of TOC team.  Today I received  a call back from [name] and plan is for a representative for DSS to met with the PMT team tomorrow morning at 1000 am for an in person GOCs meeting. ? ? ?This NP [name] reviewed medical records, received report from team, assessed the patient and then today spoke with [name] by phone discuss diagnosis, prognosis, GOC, EOL wishes disposition and options. ? ?  ?Concept of Palliative Care was introduced as specialized medical care for people and their families living with serious illness.  If focuses on providing relief from the symptoms and stress of a serious illness.  The goal is to improve quality of life for both the patient and the family. ?Values and goals of care important to patient and family were attempted to be elicited. ?  ?  ?A  discussion was had today regarding advanced directives.  Concepts specific to code status, artifical feeding and hydration, continued IV antibiotics and rehospitalization was had.  ? ?Discussed in detail with [name] the importance of decision regarding artifical feeding vs comfort feeds for [name] ? ?Recommendation is for an aggressive comfort path. ? ? The difference between a aggressive medical intervention path  and a palliative comfort care path for this  patient at this time was had.  ? ? Values and goals of care important to patient and family were attempted to be elicited. ?  ?MOST form discussed and importance of DSS/legal guardian assisting in documentation of patient's EOL moving forward. ?  ? Questions and concerns addressed.  ? ? Patient  encouraged to call with questions or concerns.   ?  ? ? ? ?Spoke to sister by phone/[name] ?Patient has legal guardianship through DSS/ ? ?SUMMARY OF RECOMMENDATIONS   ? ?Code Status/Advance Care Planning: ?DNR ? ? ?Symptom Management:  ?Dysphagia: Patient failed MBSS.  Need for discussion for alternative means of nutrition. ? ?Palliative Prophylaxis:  ?Aspiration, Bowel Regimen, Delirium Protocol, Frequent  Pain Assessment, and Oral Care    ? ?Additional Recommendations (Limitations, Scope, Preferences): ?Full Scope Treatment-- ? ?Psycho-social/Spiritual:  ?Desire for further Chaplaincy support:no ?Additional Recommendations:   Education offered to DSS regarding hospice as an options for [name] ? ?Prognosis:  ?Dependant on desire for life prolonging measures ? ?Discharge Planning: To Be Determined  ? ?  ? ?Primary Diagnoses: ?Present on Admission: ? Closed left hip fracture ([place]) ? Dementia with behavioral disturbance ([place]) ? ? ?I have reviewed the medical record, interviewed the patient and family, and examined the patient. The following aspects are pertinent. ? ?Past Medical History:  ?Diagnosis Date  ? Alcohol abuse   ? Cirrhosis ([place])   ? COPD (chronic obstructive pulmonary disease) ([place])   ? Dementia ([place])   ? Hepatitis C   ? Malnutrition ([place])   ? Throat cancer ([place])   ? Lung Cancer (presently)  History of throat cancer  ? ?Social History  ? ?Socioeconomic History  ? Marital status: Divorced  ?  Spouse name: Not on file  ? Number of children: Not on file  ? Years of education: Not on file  ? Highest education level: Not on file  ?Occupational History  ? Not on file  ?Tobacco Use  ? Smoking status: Every Day  ?  Packs/day: 0.50  ?  Years: 63.00  ?  Pack years: 31.50  ?  Types: Cigarettes  ? Smokeless tobacco: Never  ?Vaping Use  ? Vaping Use: Never used  ?Substance and Sexual Activity  ? Alcohol use: Not Currently  ?  Alcohol/week: 1.0 standard drink  ?  Types: 1 Cans of beer per week  ? Drug use: No  ? Sexual activity: Not Currently  ?Other Topics Concern  ? Not on file  ?Social History Narrative  ? Lives at [facility]  ? ?Social Determinants of Health  ? ?Financial Resource Strain: Not on file  ?Food Insecurity: Not on file  ?Transportation Needs: Not on file  ?Physical Activity: Not on file  ?Stress: Not on file  ?Social Connections: Not on file  ? ?Family History  ?Problem Relation Age of  Onset  ? Breast cancer Mother   ? ?Scheduled Meds: ? vitamin C  500 mg Oral Daily  ? buPROPion  150 mg Oral Daily  ? chlorhexidine  15 mL Mouth Rinse BID  ? Chlorhexidine Gluconate Cloth  6 each Topical Daily  ? docusate sodium  100 mg Oral BID  ? enoxaparin (LOVENOX) injection  40 mg Subcutaneous Q24H  ? feeding supplement (NEPRO CARB STEADY)  237 mL Oral TID BM  ? ferrous sulfate  325 mg Oral Q breakfast  ? folic acid  1 mg Oral Daily  ? hydrOXYzine  50 mg Oral QHS  ? levothyroxine  150 mcg  Oral Q0600  ? mouth rinse  15 mL Mouth Rinse q12n4p  ? metoprolol succinate  12.5 mg Oral QHS  ? multivitamin with minerals  1 tablet Oral Daily  ? OLANZapine  15 mg Oral QHS  ? OLANZapine  2.5 mg Oral BID AC  ? spironolactone  50 mg Oral Daily  ? tamsulosin  0.8 mg Oral QPC breakfast  ? thiamine  100 mg Oral Daily  ? umeclidinium bromide  1 puff Inhalation Daily  ? Vitamin D (Ergocalciferol)  50,000 Units Oral Q7 days  ? ?Continuous Infusions: ? cefTRIAXone (ROCEPHIN)  IV 1 g (05/28/21 2246)  ? dextrose 5% lactated ringers 50 mL/hr at 05/28/21 2247  ? ?PRN Meds:.acetaminophen, acetaminophen, bisacodyl, haloperidol lactate, HYDROcodone-acetaminophen, ipratropium-albuterol, magnesium hydroxide, metoCLOPramide **OR** metoCLOPramide (REGLAN) injection, morphine injection, polyethylene glycol ?Medications Prior to Admission:  ?Prior to Admission medications   ?Medication Sig Start Date End Date Taking? Authorizing Provider  ?buPROPion (WELLBUTRIN SR) 150 MG 12 hr tablet bupropion HCl SR 150 mg tablet,12 hr sustained-release   Yes [provider]  ?docusate (COLACE) 50 MG/5ML liquid Take 100 mg by mouth daily.   Yes [provider]  ?ferrous sulfate 325 (65 FE) MG tablet Take 325 mg by mouth daily with breakfast.   Yes [provider]  ?folic acid (FOLVITE) 1 MG tablet Take 1 mg by mouth daily.   Yes [provider]  ?furosemide (LASIX) 40 MG tablet Take 1 tablet (40 mg total) by mouth daily.  08/22/19 06/12/2021 Yes [name], MD  ?hydrOXYzine (ATARAX/VISTARIL) 25 MG tablet Take 50 mg by mouth at bedtime. 02/16/19  Yes [provider]  ?QUEtiapine (SEROQUEL) 25 MG tablet Take 25 mg by mouth

## 2021-05-29 NOTE — Progress Notes (Addendum)
? ? ? ?Progress Note  ? ? ?[name]  TDD:[id number] DOB: 16-Aug-1945  DOA: 06/08/2021 ?PCP: Remi Haggard, FNP  ? ? ? ? ?Brief Narrative:  ? ? ?Medical records reviewed and are as summarized below: ? ? ?76 year old man with past medical history of COPD, dementia, hepatitis C, cirrhosis and malnutrition and lung cancer presents with a fall and found to have a left hip fracture.  Dr. Harlow Mares brought to the operating room on 06/10/2021. ? ? ? ? ? ?Assessment/Plan:  ? ?Principal Problem: ?  Closed left hip fracture ([place]) ?Active Problems: ?  Aspiration pneumonia ([place]) ?  Acute respiratory failure with hypoxia ([place]) ?  Obstructive chronic bronchitis without exacerbation ([place]) ?  BPH (benign prostatic hyperplasia) ?  Long QT interval ?  Hypothyroidism ?  Dementia with behavioral disturbance ([place]) ?  Hyponatremia ?  Anemia, unspecified ?  Elevated liver function tests ?  Malnutrition of moderate degree ? ? ?Nutrition Problem: Moderate Malnutrition ?Etiology: chronic illness (liver cirrhosis, h/o ETOH abuse, hepatitis C, COPD, dementia) ? ?Signs/Symptoms: moderate fat depletion, severe muscle depletion ? ? ?Body mass index is 18.01 kg/m?. ? ? ? ? ?Closed left hip fracture: S/p left intramedullary nail intertrochanteric surgery on 06/14/2021.  Continue PT and OT.  Analgesics as needed for pain. ? ?Aspiration pneumonia: Continue IV ceftriaxone with plan to complete treatment tomorrow. ? ?Dysphagia: Speech therapist recommended NPO.  Continue IV fluids for hydration ? ?Acute hypoxic respiratory failure: Improving.  He initially required oxygen via heated humidified HFNC.  He is down to 3 L/min oxygen via nasal cannula.  Taper off oxygen as able. ? 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 Grayland Ormond for chemotherapy. ? ?Dementia with behavioral disturbance: Continue olanzapine ? ?Prolonged QTc interval: Improved from 571 to 495.  Of note, olanzapine had been  substituted with Seroquel. ? ?Hyponatremia, thrombocytopenia: Improved ? ?Other comorbidities include liver cirrhosis, COPD/chronic bronchitis, hypothyroidism, BPH, anemia of chronic disease ? ?Follow-up with palliative care team ? ?Overall prognosis is poor.  Awaiting call from legal guardian to discuss goals of care. ? ?Diet Order   ? ?       ?  Diet NPO time specified  Diet effective now       ?  ? ?  ?  ? ?  ? ? ? ? ? ? ?Consultants: ?Palliative care ?Orthopedic surgeon ? ?Procedures: ?None ? ? ? ?Medications:  ? ? vitamin C  500 mg Oral Daily  ? buPROPion  150 mg Oral Daily  ? chlorhexidine  15 mL Mouth Rinse BID  ? Chlorhexidine Gluconate Cloth  6 each Topical Daily  ? docusate sodium  100 mg Oral BID  ? enoxaparin (LOVENOX) injection  40 mg Subcutaneous Q24H  ? feeding supplement (NEPRO CARB STEADY)  237 mL Oral TID BM  ? ferrous sulfate  325 mg Oral Q breakfast  ? folic acid  1 mg Oral Daily  ? hydrOXYzine  50 mg Oral QHS  ? levothyroxine  150 mcg Oral Q0600  ? mouth rinse  15 mL Mouth Rinse q12n4p  ? metoprolol succinate  12.5 mg Oral QHS  ? multivitamin with minerals  1 tablet Oral Daily  ? OLANZapine  15 mg Oral QHS  ? OLANZapine  2.5 mg Oral BID AC  ? spironolactone  50 mg Oral Daily  ? tamsulosin  0.8 mg Oral QPC breakfast  ? thiamine  100 mg Oral Daily  ? umeclidinium bromide  1 puff Inhalation Daily  ? Vitamin D (Ergocalciferol)  50,000 Units Oral Q7 days  ? ?Continuous Infusions: ? cefTRIAXone (ROCEPHIN)  IV Stopped (05/28/21 2316)  ? dextrose 5% lactated ringers 50 mL/hr at 05/29/21 1400  ? ? ? ?Anti-infectives (From admission, onward)  ? ? Start     Dose/Rate Route Frequency Ordered Stop  ? 05/25/21 2230  cefTRIAXone (ROCEPHIN) 1 g in sodium chloride 0.9 % 100 mL IVPB       ? 1 g ?200 mL/hr over 30 Minutes Intravenous Every 24 hours 05/25/21 2141 05/31/21 0156  ? 06/06/2021 1800  ceFAZolin (ANCEF) IVPB 1 g/50 mL premix       ? 1 g ?100 mL/hr over 30 Minutes Intravenous Every 6 hours 06/10/2021 1407  05/21/21 0621  ? 05/17/2021 1143  ceFAZolin (ANCEF) 2-4 GM/100ML-% IVPB       ?Note to Pharmacy: Olena Mater F: cabinet override  ?    05/25/2021 1143 06/13/2021 1235  ? 06/04/2021 0724  ceFAZolin (ANCEF) IVPB 2g/100 mL premix       ? 2 g ?200 mL/hr over 30 Minutes Intravenous 30 min pre-op 06/06/2021 0724 05/19/2021 1218  ? ?  ? ? ? ? ? ? ? ? ? ?Family Communication/Anticipated D/C date and plan/Code Status  ? ?DVT prophylaxis: enoxaparin (LOVENOX) injection 40 mg Start: 05/21/21 0800 ?SCDs Start: 05/18/2021 1408 ?SCDs Start: 06/07/2021 0542 ? ? ?  Code Status: DNR ? ?Family Communication: None ?Disposition Plan: Plan to discharge to SNF when medically stable ? ? ?Status is: Inpatient ?Remains inpatient appropriate because: Dysphagia, n.p.o. status ? ? ? ? ? ? ?Subjective:  ? ?Interval events noted.  He is confused unable to provide any history. ? ?Objective:  ? ? ?Vitals:  ? 05/28/21 2317 05/29/21 0314 05/29/21 0427 05/29/21 0830  ?BP: (!) 139/92 136/66 (!) 121/56 135/72  ?Pulse: (!) 101 93 93 98  ?Resp: 18 18 17 20   ?Temp: 98.4 ?F (36.9 ?C)   99 ?F (37.2 ?C)  ?TempSrc:    Oral  ?SpO2: 92% 96% 94% 100%  ?Weight:      ?Height:      ? ?No data found. ? ? ?Intake/Output Summary (Last 24 hours) at 05/29/2021 1651 ?Last data filed at 05/29/2021 1400 ?Gross per 24 hour  ?Intake 1146.96 ml  ?Output 1000 ml  ?Net 146.96 ml  ? ?Filed Weights  ? 06/07/2021 0541 06/08/2021 1142 05/26/21 0000  ?Weight: 55.6 kg 55.6 kg 50.6 kg  ? ? ?Exam: ? ?GEN: NAD ?SKIN: No rash ?EYES: EOMI ?ENT: MMM ?CV: RRR ?PULM: CTA B ?ABD: soft, ND, NT, +BS ?CNS: AAO x 1 (person), non focal ?EXT: No edema or tenderness ? ? ? ? ?Data Reviewed:  ? ?I have personally reviewed following labs and imaging studies: ? ?Labs: ?Labs show the following:  ? ?Basic Metabolic Panel: ?Recent Labs  ?Lab 05/24/21 ?1517 05/25/21 ?0419 05/26/21 ?0106 05/27/21 ?6160 05/28/21 ?0617  ?NA 131* 133* 129* 134* 135  ?K 4.8 4.5 4.7 4.4 4.1  ?CL 103 105 101 104 107  ?CO2 24 25 23 23 24   ?GLUCOSE  94 99 119* 85 120*  ?BUN 36* 33* 34* 36* 45*  ?CREATININE 0.96 0.99 1.02 0.85 1.06  ?CALCIUM 8.7* 8.7* 8.9 8.5* 8.8*  ?MG  --   --  1.7  --   --   ?PHOS  --   --   --  3.1  --   ? ?GFR ?Estimated Creatinine Clearance: 41.1 mL/min (by C-G formula based  on SCr of 1.06 mg/dL). ?Liver Function Tests: ?Recent Labs  ?Lab 05/23/21 ?0420 05/24/21 ?1884 05/25/21 ?0419  ?AST 45* 43* 44*  ?ALT 27 25 26   ?ALKPHOS 97 81 78  ?BILITOT 1.0 1.3* 1.6*  ?PROT 5.6* 5.4* 5.9*  ?ALBUMIN 2.0* 1.8* 1.9*  ? ?No results for input(s): LIPASE, AMYLASE in the last 168 hours. ?No results for input(s): AMMONIA in the last 168 hours. ?Coagulation profile ?No results for input(s): INR, PROTIME in the last 168 hours. ? ?CBC: ?Recent Labs  ?Lab 05/24/21 ?1660 05/24/21 ?1626 05/24/21 ?1905 05/25/21 ?6301 05/26/21 ?0106 05/27/21 ?6010 05/28/21 ?9323  ?WBC 11.8*  --   --  12.6* 19.8* 14.7* 16.7*  ?HGB 6.9*   < > 8.9* 9.0* 9.8* 9.7* 9.7*  ?HCT 20.3*   < > 26.4* 26.6* 28.8* 29.3* 29.9*  ?MCV 95.8  --   --  92.7 93.8 95.4 96.8  ?PLT 161  --   --  173 171 202 182  ? < > = values in this interval not displayed.  ? ?Cardiac Enzymes: ?No results for input(s): CKTOTAL, CKMB, CKMBINDEX, TROPONINI in the last 168 hours. ?BNP (last 3 results) ?No results for input(s): PROBNP in the last 8760 hours. ?CBG: ?Recent Labs  ?Lab 05/25/21 ?2348  ?GLUCAP 116*  ? ?D-Dimer: ?No results for input(s): DDIMER in the last 72 hours. ? ?Hgb A1c: ?No results for input(s): HGBA1C in the last 72 hours. ?Lipid Profile: ?No results for input(s): CHOL, HDL, LDLCALC, TRIG, CHOLHDL, LDLDIRECT in the last 72 hours. ?Thyroid function studies: ?No results for input(s): TSH, T4TOTAL, T3FREE, THYROIDAB in the last 72 hours. ? ?Invalid input(s): FREET3 ? ?Anemia work up: ?No results for input(s): VITAMINB12, FOLATE, FERRITIN, TIBC, IRON, RETICCTPCT in the last 72 hours. ?Sepsis Labs: ?Recent Labs  ?Lab 05/25/21 ?0419 05/26/21 ?0106 05/26/21 ?0325 05/27/21 ?5573 05/28/21 ?0617  ?PROCALCITON  --    --  1.48  --   --   ?WBC 12.6* 19.8*  --  14.7* 16.7*  ? ? ?Microbiology ?Recent Results (from the past 240 hour(s))  ?Resp Panel by RT-PCR (Flu A&B, Covid) Nasopharyngeal Swab     Status: None  ? Collection Ti

## 2021-05-30 ENCOUNTER — Telehealth: Payer: Self-pay | Admitting: Oncology

## 2021-05-30 DIAGNOSIS — Z7189 Other specified counseling: Secondary | ICD-10-CM

## 2021-05-30 DIAGNOSIS — S72002A Fracture of unspecified part of neck of left femur, initial encounter for closed fracture: Secondary | ICD-10-CM | POA: Diagnosis not present

## 2021-05-30 DIAGNOSIS — J9601 Acute respiratory failure with hypoxia: Secondary | ICD-10-CM | POA: Diagnosis not present

## 2021-05-30 DIAGNOSIS — J690 Pneumonitis due to inhalation of food and vomit: Secondary | ICD-10-CM | POA: Diagnosis not present

## 2021-05-30 LAB — CBC WITH DIFFERENTIAL/PLATELET
Abs Immature Granulocytes: 0.4 10*3/uL — ABNORMAL HIGH (ref 0.00–0.07)
Basophils Absolute: 0 10*3/uL (ref 0.0–0.1)
Basophils Relative: 0 %
Eosinophils Absolute: 0 10*3/uL (ref 0.0–0.5)
Eosinophils Relative: 0 %
HCT: 31.3 % — ABNORMAL LOW (ref 39.0–52.0)
Hemoglobin: 10 g/dL — ABNORMAL LOW (ref 13.0–17.0)
Immature Granulocytes: 3 %
Lymphocytes Relative: 6 %
Lymphs Abs: 0.8 10*3/uL (ref 0.7–4.0)
MCH: 31.3 pg (ref 26.0–34.0)
MCHC: 31.9 g/dL (ref 30.0–36.0)
MCV: 98.1 fL (ref 80.0–100.0)
Monocytes Absolute: 1 10*3/uL (ref 0.1–1.0)
Monocytes Relative: 8 %
Neutro Abs: 10.9 10*3/uL — ABNORMAL HIGH (ref 1.7–7.7)
Neutrophils Relative %: 83 %
Platelets: 152 10*3/uL (ref 150–400)
RBC: 3.19 MIL/uL — ABNORMAL LOW (ref 4.22–5.81)
RDW: 18.6 % — ABNORMAL HIGH (ref 11.5–15.5)
WBC: 13.2 10*3/uL — ABNORMAL HIGH (ref 4.0–10.5)
nRBC: 0 % (ref 0.0–0.2)

## 2021-05-30 LAB — BASIC METABOLIC PANEL
Anion gap: 4 — ABNORMAL LOW (ref 5–15)
BUN: 34 mg/dL — ABNORMAL HIGH (ref 8–23)
CO2: 27 mmol/L (ref 22–32)
Calcium: 9.4 mg/dL (ref 8.9–10.3)
Chloride: 113 mmol/L — ABNORMAL HIGH (ref 98–111)
Creatinine, Ser: 0.76 mg/dL (ref 0.61–1.24)
GFR, Estimated: >60 mL/min (ref >60)
Glucose, Bld: 118 mg/dL — ABNORMAL HIGH (ref 70–99)
Potassium: 4.2 mmol/L (ref 3.5–5.1)
Sodium: 144 mmol/L (ref 135–145)

## 2021-05-30 LAB — MAGNESIUM: Magnesium: 1.9 mg/dL (ref 1.7–2.4)

## 2021-05-30 LAB — CULTURE, BLOOD (ROUTINE X 2)
Culture: NO GROWTH
Culture: NO GROWTH
Special Requests: ADEQUATE
Special Requests: ADEQUATE

## 2021-05-30 LAB — PHOSPHORUS: Phosphorus: 2.6 mg/dL (ref 2.5–4.6)

## 2021-05-30 MED ORDER — OLANZAPINE 5 MG PO TBDP
10.0000 mg | ORAL_TABLET | Freq: Every day | ORAL | Status: DC
  Administered 2021-05-30: 10 mg via ORAL
  Filled 2021-05-30: qty 1
  Filled 2021-05-30: qty 2
  Filled 2021-05-30 (×2): qty 1

## 2021-05-30 NOTE — Progress Notes (Addendum)
? ? ? ?Progress Note  ? ? ?[name]  MRN:[id number] DOB: 01/24/1946  DOA: 06/14/2021 ?PCP: Lindley, Cheryl P, FNP  ? ? ? ? ?Brief Narrative:  ? ? ?Medical records reviewed and are as summarized below: ? ? ?76-year-old man with past medical history of COPD, dementia, hepatitis C, cirrhosis and malnutrition and lung cancer presents with a fall and found to have a left hip fracture.  Dr. Bowers brought to the operating room on 05/18/2021. ? ? ? ? ? ?Assessment/Plan:  ? ?Principal Problem: ?  Closed left hip fracture (HCC) ?Active Problems: ?  Aspiration pneumonia (HCC) ?  Acute respiratory failure with hypoxia (HCC) ?  Obstructive chronic bronchitis without exacerbation (HCC) ?  BPH (benign prostatic hyperplasia) ?  Long QT interval ?  Hypothyroidism ?  Dementia with behavioral disturbance (HCC) ?  Hyponatremia ?  Anemia, unspecified ?  Elevated liver function tests ?  Malnutrition of moderate degree ? ? ?Nutrition Problem: Moderate Malnutrition ?Etiology: chronic illness (liver cirrhosis, h/o ETOH abuse, hepatitis C, COPD, dementia) ? ?Signs/Symptoms: moderate fat depletion, severe muscle depletion ? ? ?Body mass index is 18.01 kg/m?. ? ? ? ? ?Closed left hip fracture: S/p left intramedullary nail intertrochanteric surgery on 05/27/2021.  Continue PT and OT as able.  Use analgesics as needed for pain.   ? ?Aspiration pneumonia, leukocytosis: Leukocytosis is improving.  Plan to complete 6 days of IV ceftriaxone today. ? ?Dysphagia: Speech therapist recommended NPO.  Continue IV fluids for hydration ? ?Acute hypoxic respiratory failure: Improving.  He initially required oxygen via heated humidified HFNC.  He is down to 3 L/min oxygen via nasal cannula.  Taper off oxygen as able. ? 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 Finnegan for chemotherapy. ? ?Dementia with behavioral disturbance: Continue olanzapine ? ?Prolonged QTc interval: Improved from 571 to 495.   Of note, olanzapine had been substituted with Seroquel. ? ?Hyponatremia, thrombocytopenia: Improved ? ?Other comorbidities include liver cirrhosis, COPD/chronic bronchitis, hypothyroidism, BPH, anemia of chronic disease ? ?Crystal, NP, with palliative care team met with Kylie from DSS today.  MOST form was completed and this will be returned to the hospital after it has been reviewed and signed by DSS leadership. ? ? ? ?Diet Order   ? ?       ?  Diet NPO time specified  Diet effective now       ?  ? ?  ?  ? ?  ? ? ? ? ? ? ?Consultants: ?Palliative care ?Orthopedic surgeon ? ?Procedures: ?None ? ? ? ?Medications:  ? ? vitamin C  500 mg Oral Daily  ? buPROPion  150 mg Oral Daily  ? chlorhexidine  15 mL Mouth Rinse BID  ? Chlorhexidine Gluconate Cloth  6 each Topical Daily  ? docusate sodium  100 mg Oral BID  ? enoxaparin (LOVENOX) injection  40 mg Subcutaneous Q24H  ? feeding supplement (NEPRO CARB STEADY)  237 mL Oral TID BM  ? ferrous sulfate  325 mg Oral Q breakfast  ? folic acid  1 mg Oral Daily  ? hydrOXYzine  50 mg Oral QHS  ? levothyroxine  150 mcg Oral Q0600  ? mouth rinse  15 mL Mouth Rinse q12n4p  ? metoprolol succinate  12.5 mg Oral QHS  ? multivitamin with minerals  1 tablet Oral Daily  ? OLANZapine  15 mg Oral QHS  ? OLANZapine  2.5 mg Oral BID AC  ? spironolactone    50 mg Oral Daily  ? tamsulosin  0.8 mg Oral QPC breakfast  ? thiamine  100 mg Oral Daily  ? umeclidinium bromide  1 puff Inhalation Daily  ? Vitamin D (Ergocalciferol)  50,000 Units Oral Q7 days  ? ?Continuous Infusions: ? cefTRIAXone (ROCEPHIN)  IV Stopped (05/30/21 0200)  ? dextrose 5% lactated ringers 50 mL/hr at 05/30/21 0558  ? ? ? ?Anti-infectives (From admission, onward)  ? ? Start     Dose/Rate Route Frequency Ordered Stop  ? 05/25/21 2230  cefTRIAXone (ROCEPHIN) 1 g in sodium chloride 0.9 % 100 mL IVPB       ? 1 g ?200 mL/hr over 30 Minutes Intravenous Every 24 hours 05/25/21 2141 05/31/21 0156  ? 06/08/2021 1800  ceFAZolin (ANCEF) IVPB  1 g/50 mL premix       ? 1 g ?100 mL/hr over 30 Minutes Intravenous Every 6 hours 05/25/2021 1407 05/21/21 0621  ? 05/26/2021 1143  ceFAZolin (ANCEF) 2-4 GM/100ML-% IVPB       ?Note to Pharmacy: Fuentes, Silvia F: cabinet override  ?    06/09/2021 1143 06/14/2021 1235  ? 06/07/2021 0724  ceFAZolin (ANCEF) IVPB 2g/100 mL premix       ? 2 g ?200 mL/hr over 30 Minutes Intravenous 30 min pre-op 06/04/2021 0724 06/14/2021 1218  ? ?  ? ? ? ? ? ? ? ? ? ?Family Communication/Anticipated D/C date and plan/Code Status  ? ?DVT prophylaxis: enoxaparin (LOVENOX) injection 40 mg Start: 05/21/21 0800 ?SCDs Start: 05/25/2021 1408 ?SCDs Start: 05/29/2021 0542 ? ? ?  Code Status: DNR ? ?Family Communication: None ?Disposition Plan: Plan to discharge to SNF when medically stable ? ? ?Status is: Inpatient ?Remains inpatient appropriate because: Dysphagia, n.p.o. status ? ? ? ? ? ? ?Subjective:  ? ?Interval events noted.  He is confused unable to provide any history.  He has a sitter at the bedside who said he has been trying to get out of bed intermittently. ? ?Objective:  ? ? ?Vitals:  ? 05/29/21 2302 05/30/21 0316 05/30/21 0747 05/30/21 1216  ?BP: 112/70 (!) 130/57 129/68 125/64  ?Pulse: (!) 101 98 97 (!) 107  ?Resp: 18 20 16 20  ?Temp: 97.9 ?F (36.6 ?C) 97.6 ?F (36.4 ?C) 98.5 ?F (36.9 ?C) 98 ?F (36.7 ?C)  ?TempSrc: Oral Oral    ?SpO2: 91% 93% 94% 92%  ?Weight:      ?Height:      ? ?No data found. ? ? ?Intake/Output Summary (Last 24 hours) at 05/30/2021 1413 ?Last data filed at 05/30/2021 1300 ?Gross per 24 hour  ?Intake 961.89 ml  ?Output 675 ml  ?Net 286.89 ml  ? ?Filed Weights  ? 06/06/2021 0541 06/15/2021 1142 05/26/21 0000  ?Weight: 55.6 kg 55.6 kg 50.6 kg  ? ? ?Exam: ? ?GEN: NAD ?SKIN: No rash ?EYES: EOMI ?ENT: MMM, edentulous ?CV: RRR ?PULM: CTA B ?ABD: soft, ND, NT, +BS ?CNS: Alert but disoriented, non focal.  Speech is difficult to understand and speech is slurred because he is edentulous ?EXT: No edema or tenderness ? ? ? ? ? ?Data Reviewed:  ? ?I  have personally reviewed following labs and imaging studies: ? ?Labs: ?Labs show the following:  ? ?Basic Metabolic Panel: ?Recent Labs  ?Lab 05/25/21 ?0419 05/26/21 ?0106 05/27/21 ?0427 05/28/21 ?0617 05/30/21 ?0854  ?NA 133* 129* 134* 135 144  ?K 4.5 4.7 4.4 4.1 4.2  ?CL 105 101 104 107 113*  ?CO2 25 23 23 24 27  ?GLUCOSE 99   119* 85 120* 118*  ?BUN 33* 34* 36* 45* 34*  ?CREATININE 0.99 1.02 0.85 1.06 0.76  ?CALCIUM 8.7* 8.9 8.5* 8.8* 9.4  ?MG  --  1.7  --   --  1.9  ?PHOS  --   --  3.1  --  2.6  ? ?GFR ?Estimated Creatinine Clearance: 54.5 mL/min (by C-G formula based on SCr of 0.76 mg/dL). ?Liver Function Tests: ?Recent Labs  ?Lab 05/24/21 ?2426 05/25/21 ?0419  ?AST 43* 44*  ?ALT 25 26  ?ALKPHOS 81 78  ?BILITOT 1.3* 1.6*  ?PROT 5.4* 5.9*  ?ALBUMIN 1.8* 1.9*  ? ?No results for input(s): LIPASE, AMYLASE in the last 168 hours. ?No results for input(s): AMMONIA in the last 168 hours. ?Coagulation profile ?No results for input(s): INR, PROTIME in the last 168 hours. ? ?CBC: ?Recent Labs  ?Lab 05/25/21 ?0419 05/26/21 ?0106 05/27/21 ?8341 05/28/21 ?9622 05/30/21 ?2979  ?WBC 12.6* 19.8* 14.7* 16.7* 13.2*  ?NEUTROABS  --   --   --   --  10.9*  ?HGB 9.0* 9.8* 9.7* 9.7* 10.0*  ?HCT 26.6* 28.8* 29.3* 29.9* 31.3*  ?MCV 92.7 93.8 95.4 96.8 98.1  ?PLT 173 171 202 182 152  ? ?Cardiac Enzymes: ?No results for input(s): CKTOTAL, CKMB, CKMBINDEX, TROPONINI in the last 168 hours. ?BNP (last 3 results) ?No results for input(s): PROBNP in the last 8760 hours. ?CBG: ?Recent Labs  ?Lab 05/25/21 ?2348  ?GLUCAP 116*  ? ?D-Dimer: ?No results for input(s): DDIMER in the last 72 hours. ? ?Hgb A1c: ?No results for input(s): HGBA1C in the last 72 hours. ?Lipid Profile: ?No results for input(s): CHOL, HDL, LDLCALC, TRIG, CHOLHDL, LDLDIRECT in the last 72 hours. ?Thyroid function studies: ?No results for input(s): TSH, T4TOTAL, T3FREE, THYROIDAB in the last 72 hours. ? ?Invalid input(s): FREET3 ? ?Anemia work up: ?No results for input(s):  VITAMINB12, FOLATE, FERRITIN, TIBC, IRON, RETICCTPCT in the last 72 hours. ?Sepsis Labs: ?Recent Labs  ?Lab 05/26/21 ?0106 05/26/21 ?8921 05/27/21 ?1941 05/28/21 ?7408 05/30/21 ?1448  ?PROCALCITON  --  1

## 2021-05-30 NOTE — TOC Progression Note (Signed)
Transition of Care (TOC) - Progression Note  ? ? ?Patient Details  ?Name: [name] ?MRN: [id number] ?Date of Birth: May 07, 1945 ? ?Transition of Care (TOC) CM/SW Contact  ?Alberteen Sam, LCSW ?Phone Number: ?05/30/2021, 8:21 AM ? ?Clinical Narrative:    ? ?CSW notes plans for palliative to meet with DSS today 4/14 at 10am for [place] Harrigan discussion.  ? ?TOC will continue to follow for discharge planning.  ? ? ?Expected Discharge Plan: [place] ?Barriers to Discharge: Continued Medical Work up ? ?Expected Discharge Plan and Services ?Expected Discharge Plan: [place] ?  ?Discharge Planning Services: CM Consult ?Post Acute Care Choice: [place] ?Living arrangements for the past 2 months: Group Home ?                ?DME Arranged: N/A ?DME Agency: NA ?  ?  ?  ?HH Arranged: NA ?[place] Agency: NA ?  ?  ?  ? ? ?Social Determinants of Health (SDOH) Interventions ?  ? ?Readmission Risk Interventions ?   ? View : No data to display.  ?  ?  ?  ? ? ?

## 2021-05-30 NOTE — Progress Notes (Addendum)
? ?                                                                                                                                                     ?                                                   ?Daily Progress Note  ? ?Patient Name: [name]       Date: 05/30/2021 ?DOB: 17-Nov-1945  Age: 76 y.o. MRN#: [id number] ?Attending Physician: Jennye Boroughs, MD ?Primary Care Physician: Remi Haggard, FNP ?Admit Date: 05/29/2021 ? ?Reason for Consultation/Follow-up: Establishing goals of care ? ?Subjective: ?Notes, labs, diagnostics reviewed. Spoke with colleague Stanton Kidney via phone in transfer of care; Stanton Kidney has discussed patient's status and updates as well as options in care and recommendations with [name]. With chart review and assessment of [name], I agree with recommendations for comfort care with comfort feeds. Spoke with attending and bedside RN as well as Agricultural consultant. MOST form completed.  ? ?Received a call DSS was here. Met with [name] at 10:40. She discusses how kind, loving, and generous he is. We discussed his status and reviewed the SLP note. Discussed my agreement with comfort care and feeds and prognosis of < 2 weeks. Completed MOST form provided to [facility]. ? ?ADDENDUM: Continued conversation after this note was printed for her to take with her. Following her receiving documents, sat down with [name] and had a full [name] conversation discussing in detail an aggressive path and a comfort path. Answered questions and discussed various scenarios and situations and prognosis. She states she will take the MOST form to her leadership and bring it back.  ? ?Length of Stay: 10 ? ?Current Medications: ?Scheduled Meds:  ? vitamin C  500 mg Oral Daily  ? buPROPion  150 mg Oral Daily  ? chlorhexidine  15 mL Mouth Rinse BID  ? Chlorhexidine Gluconate Cloth  6 each Topical Daily  ? docusate sodium  100 mg Oral BID  ? enoxaparin (LOVENOX) injection  40 mg Subcutaneous Q24H  ? feeding supplement (NEPRO CARB STEADY)  237  mL Oral TID BM  ? ferrous sulfate  325 mg Oral Q breakfast  ? folic acid  1 mg Oral Daily  ? hydrOXYzine  50 mg Oral QHS  ? levothyroxine  150 mcg Oral Q0600  ? mouth rinse  15 mL Mouth Rinse q12n4p  ? metoprolol succinate  12.5 mg Oral QHS  ? multivitamin with minerals  1 tablet Oral Daily  ? OLANZapine  15 mg Oral QHS  ? OLANZapine  2.5 mg Oral BID AC  ? spironolactone  50 mg Oral Daily  ?  tamsulosin  0.8 mg Oral QPC breakfast  ? thiamine  100 mg Oral Daily  ? umeclidinium bromide  1 puff Inhalation Daily  ? Vitamin D (Ergocalciferol)  50,000 Units Oral Q7 days  ? ? ?Continuous Infusions: ? cefTRIAXone (ROCEPHIN)  IV Stopped (05/30/21 0200)  ? dextrose 5% lactated ringers 50 mL/hr at 05/30/21 0558  ? ? ?PRN Meds: ?acetaminophen, acetaminophen, bisacodyl, haloperidol lactate, HYDROcodone-acetaminophen, ipratropium-albuterol, magnesium hydroxide, metoCLOPramide **OR** metoCLOPramide (REGLAN) injection, morphine injection, polyethylene glycol ? ?Physical Exam ?Pulmonary:  ?   Effort: Pulmonary effort is normal.  ?Neurological:  ?   Mental Status: He is alert. He is disoriented.  ?         ? ?Vital Signs: BP 129/68 (BP Location: Left Arm)   Pulse 97   Temp 98.5 ?F (36.9 ?C)   Resp 16   Ht 5' 6"  (1.676 m)   Wt 50.6 kg   SpO2 94%   BMI 18.01 kg/m?  ?SpO2: SpO2: 94 % ?O2 Device: O2 Device: Nasal Cannula ?O2 Flow Rate: O2 Flow Rate (L/min): 2 L/min ? ?Intake/output summary:  ?Intake/Output Summary (Last 24 hours) at 05/30/2021 1030 ?Last data filed at 05/30/2021 7989 ?Gross per 24 hour  ?Intake 810.51 ml  ?Output 1075 ml  ?Net -264.49 ml  ? ?LBM: Last BM Date : 05/28/21 ?Baseline Weight: Weight: 55.6 kg ?Most recent weight: Weight: 50.6 kg ? ?     ? ? ?Patient Active Problem List  ? Diagnosis Date Noted  ? Aspiration pneumonia ([place]) 05/29/2021  ? Acute respiratory failure with hypoxia ([place]) 05/29/2021  ? Malnutrition of moderate degree 05/21/2021  ? Closed left hip fracture ([place]) 05/18/2021  ? Hypothyroidism  05/21/2021  ? Obstructive chronic bronchitis without exacerbation ([place]) 06/04/2021  ? Long QT interval 06/13/2021  ? Hyponatremia 05/24/2021  ? Anemia, unspecified 06/01/2021  ? Elevated liver function tests 05/19/2021  ? Chronic liver disease and cirrhosis ([place])   ? Hypotension 06/27/2019  ? Alcoholic hepatitis 21/19/4174  ? Anxiety 04/20/2019  ? BPH (benign prostatic hyperplasia) 04/20/2019  ? HCV (hepatitis C virus) 04/20/2019  ? History of cocaine use 04/20/2019  ? History of fall 04/20/2019  ? History of noncompliance with medical treatment 04/20/2019  ? History of gastric ulcer 04/20/2019  ? Jaw fracture ([place]) 04/20/2019  ? Skin erosion 04/20/2019  ? Tobacco abuse 04/20/2019  ? Urinary retention 04/20/2019  ? Adenocarcinoma, lung, right ([place]) 03/17/2019  ? Near syncope   ? Encounter for hospice care discussion   ? Dehydration   ? Renal insufficiency   ? Protein-calorie malnutrition, severe 05/14/2016  ? Throat cancer ([place])   ? Palliative care encounter   ? AKI (acute kidney injury) ([place]) 05/13/2016  ? Goals of care, counseling/discussion 03/01/2016  ? Primary cancer of larynx ([place]) 02/19/2016  ? Alcohol abuse 11/18/2015  ? Dementia with behavioral disturbance ([place]) 10/21/2015  ? ? ?Palliative Care Assessment & Plan  ? ? ?Recommendations/Plan: ?I agree with previous PMT provider Stanton Kidney, and I also recommend comfort focused care with comfort feeds as tolerated. ? ?ADDENDUM: [name] will take the MOST to her leadership for review. Discussed comfort care and hospice.  Recommend hospice facility placement as patient is from group home.  ? ? ? ?Code Status: ? ?  ?Code Status Orders  ?(From admission, onward)  ?  ? ? ?  ? ?  Start     Ordered  ? 06/11/2021 0544  Do not attempt resuscitation (DNR)  Continuous       ?Question Answer  Comment  ?In the event of cardiac or respiratory ARREST Do not call a ?code blue?   ?In the event of cardiac or respiratory ARREST Do not perform Intubation, CPR, defibrillation or ACLS   ?In the  event of cardiac or respiratory ARREST Use medication by any route, position, wound care, and other measures to relive pain and suffering. May use oxygen, suction and manual treatment of airway obstruction as needed for comfort.   ?  ? 05/21/2021 0543  ? ?  ?  ? ?  ? ?Code Status History   ? ? Date Active Date Inactive Code Status Order ID Comments User Context  ? 06/27/2019 0142 06/30/2019 1954 DNR [id number]  Awilda Bill, NP ED  ? 05/26/2016 0940 05/26/2016 2013 DNR [id number]  Melton Alar, PA-C Inpatient  ? 05/25/2016 0411 05/26/2016 0940 Full Code [id number]  Harrie Foreman, MD Inpatient  ? 05/13/2016 1945 05/15/2016 2149 Full Code [id number]  Hillary Bow, MD ED  ? ?  ? ? ?Prognosis: ? < 2 weeks ? ?Case discussed with RN and MD, and TOC and DSS. ? ? ?Asencion Gowda, NP ? ?Please contact Palliative Medicine Team phone at [phone] for questions and concerns.  ? ? ? ? ? ?

## 2021-05-30 NOTE — Progress Notes (Signed)
Nutrition Brief Note  Chart reviewed. Pt now transitioning to comfort care.  No further nutrition interventions planned at this time.  Please re-consult as needed.   Keishana Klinger W, RD, LDN, CDCES Registered Dietitian II Certified Diabetes Care and Education Specialist Please refer to [facility] for RD and/or RD on-call/weekend/after hours pager   

## 2021-05-30 NOTE — Telephone Encounter (Signed)
Caregiver Judson Roch) called on behalf of [name]. Pt is hospitalized and unable to make appt,mdoes not know when pt will return for care, will call KJ ?

## 2021-05-31 DIAGNOSIS — J9601 Acute respiratory failure with hypoxia: Secondary | ICD-10-CM | POA: Diagnosis not present

## 2021-05-31 DIAGNOSIS — J690 Pneumonitis due to inhalation of food and vomit: Secondary | ICD-10-CM | POA: Diagnosis not present

## 2021-05-31 DIAGNOSIS — S72002A Fracture of unspecified part of neck of left femur, initial encounter for closed fracture: Secondary | ICD-10-CM | POA: Diagnosis not present

## 2021-05-31 MED ORDER — MORPHINE SULFATE (PF) 2 MG/ML IV SOLN
2.0000 mg | INTRAVENOUS | Status: DC | PRN
  Administered 2021-06-02: 2 mg via INTRAVENOUS
  Filled 2021-05-31: qty 1

## 2021-05-31 MED ORDER — LORAZEPAM 2 MG/ML IJ SOLN
1.0000 mg | INTRAMUSCULAR | Status: DC | PRN
  Administered 2021-05-31 – 2021-06-02 (×4): 1 mg via INTRAVENOUS
  Filled 2021-05-31 (×4): qty 1

## 2021-05-31 NOTE — Progress Notes (Addendum)
? ? ? ?Progress Note  ? ? ?[name]  HYQ:[id number] DOB: 03/13/1945  DOA: 06/15/2021 ?PCP: Remi Haggard, FNP  ? ? ? ? ?Brief Narrative:  ? ? ?Medical records reviewed and are as summarized below: ? ? ?76 year old man with past medical history of COPD, dementia, hepatitis C, cirrhosis and malnutrition and lung cancer presents with a fall and found to have a left hip fracture.  Dr. Harlow Mares brought to the operating room on 05/30/2021. ? ? ? ? ? ?Assessment/Plan:  ? ?Principal Problem: ?  Closed left hip fracture ([place]) ?Active Problems: ?  Aspiration pneumonia ([place]) ?  Acute respiratory failure with hypoxia ([place]) ?  Obstructive chronic bronchitis without exacerbation ([place]) ?  BPH (benign prostatic hyperplasia) ?  Long QT interval ?  Hypothyroidism ?  Dementia with behavioral disturbance ([place]) ?  Hyponatremia ?  Anemia, unspecified ?  Elevated liver function tests ?  Malnutrition of moderate degree ? ? ?Nutrition Problem: Moderate Malnutrition ?Etiology: chronic illness (liver cirrhosis, h/o ETOH abuse, hepatitis C, COPD, dementia) ? ?Signs/Symptoms: moderate fat depletion, severe muscle depletion ? ? ?Body mass index is 18.01 kg/m?. ? 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 ? ?PLAN ? ?Legal guardian with DSS has completed and signed MOST form.  Opted for comfort measures. ?Patient will be transitioned to comfort care and hospice team will be consulted to assist with placement to hospice house. ?Follow-up with case manager/social worker to assist with disposition. ?Discontinue all oral and nonessential medications ? ? ? ?Diet Order   ? ?       ?  Diet  NPO time specified  Diet effective now       ?  ? ?  ?  ? ?  ? ? ? ? ? ? ?Consultants: ?Palliative care ?Orthopedic surgeon ? ?Procedures: ?None ? ? ? ?Medications:  ? ? chlorhexidine  15 mL Mouth Rinse BID  ? Chlorhexidine Gluconate Cloth  6 each Topical Daily  ? mouth rinse  15 mL Mouth Rinse q12n4p  ? OLANZapine zydis  10 mg Oral QHS  ? umeclidinium bromide  1 puff Inhalation Daily  ? ?Continuous Infusions: ? ? ? ? ?Anti-infectives (From admission, onward)  ? ? Start     Dose/Rate Route Frequency Ordered Stop  ? 05/25/21 2230  cefTRIAXone (ROCEPHIN) 1 g in sodium chloride 0.9 % 100 mL IVPB       ? 1 g ?200 mL/hr over 30 Minutes Intravenous Every 24 hours 05/25/21 2141 05/31/21 0000  ? 05/25/2021 1800  ceFAZolin (ANCEF) IVPB 1 g/50 mL premix       ? 1 g ?100 mL/hr over 30 Minutes Intravenous Every 6 hours 06/01/2021 1407 05/21/21 0621  ? 05/29/2021 1143  ceFAZolin (ANCEF) 2-4 GM/100ML-% IVPB       ?Note to Pharmacy: Olena Mater F: cabinet override  ?    05/24/2021 1143 05/26/2021 1235  ? 05/22/2021 0724  ceFAZolin (ANCEF) IVPB 2g/100 mL premix       ? 2 g ?200 mL/hr over 30 Minutes Intravenous 30 min pre-op 05/29/2021 0724 05/23/2021 1218  ? ?  ? ? ? ? ? ? ? ? ? ?Family Communication/Anticipated D/C date and plan/Code Status  ? ?DVT prophylaxis: SCDs Start: 05/31/2021 1408 ?SCDs  Start: 06/08/2021 0542 ? ? ?  Code Status: DNR ? ?Family Communication: None ?Disposition Plan: Plan to discharge to hospice house ? ? ?Status is: Inpatient ?Remains inpatient appropriate because: Comfort measures ? ? ? ? ? ? ?Subjective:  ? ?Interval events noted.  He is unable to provide any history because of confusion.  He has a Actuary at the bedside. ? ?Objective:  ? ? ?Vitals:  ? 05/30/21 1946 05/31/21 0422 05/31/21 0425 05/31/21 0520  ?BP: 111/66 125/62    ?Pulse: (!) 105 89  (!) 104  ?Resp: 14 16    ?Temp: 97.9 ?F (36.6 ?C) 97.9 ?F (36.6 ?C)    ?TempSrc:      ?SpO2: (!) 89%  92% 95%  ?Weight:      ?Height:      ? ?No data found. ? ? ?Intake/Output  Summary (Last 24 hours) at 05/31/2021 1507 ?Last data filed at 05/31/2021 1437 ?Gross per 24 hour  ?Intake 240.83 ml  ?Output 850 ml  ?Net -609.17 ml  ? ?Filed Weights  ? 05/30/2021 0541 06/14/2021 1142 05/26/21 0000  ?Weight: 55.6 kg 55.6 kg 50.6 kg  ? ? ?Exam: ? ? ?GEN: NAD ?SKIN: Dressing on the left hip surgical wound is clean, dry and intact ?EYES: No pallor or icterus ?ENT: MMM ?CV: RRR ?PULM: CTA B ?ABD: soft, ND, NT, +BS ?CNS: Alert but disoriented.  Speech is slurred and difficult to understand ?EXT: No edema or tenderness ? ? ? ? ?Data Reviewed:  ? ?I have personally reviewed following labs and imaging studies: ? ?Labs: ?Labs show the following:  ? ?Basic Metabolic Panel: ?Recent Labs  ?Lab 05/25/21 ?0419 05/26/21 ?0106 05/27/21 ?1696 05/28/21 ?7893 05/30/21 ?8101  ?NA 133* 129* 134* 135 144  ?K 4.5 4.7 4.4 4.1 4.2  ?CL 105 101 104 107 113*  ?CO2 25 23 23 24 27   ?GLUCOSE 99 119* 85 120* 118*  ?BUN 33* 34* 36* 45* 34*  ?CREATININE 0.99 1.02 0.85 1.06 0.76  ?CALCIUM 8.7* 8.9 8.5* 8.8* 9.4  ?MG  --  1.7  --   --  1.9  ?PHOS  --   --  3.1  --  2.6  ? ?GFR ?Estimated Creatinine Clearance: 54.5 mL/min (by C-G formula based on SCr of 0.76 mg/dL). ?Liver Function Tests: ?Recent Labs  ?Lab 05/25/21 ?0419  ?AST 44*  ?ALT 26  ?ALKPHOS 78  ?BILITOT 1.6*  ?PROT 5.9*  ?ALBUMIN 1.9*  ? ?No results for input(s): LIPASE, AMYLASE in the last 168 hours. ?No results for input(s): AMMONIA in the last 168 hours. ?Coagulation profile ?No results for input(s): INR, PROTIME in the last 168 hours. ? ?CBC: ?Recent Labs  ?Lab 05/25/21 ?0419 05/26/21 ?0106 05/27/21 ?7510 05/28/21 ?2585 05/30/21 ?2778  ?WBC 12.6* 19.8* 14.7* 16.7* 13.2*  ?NEUTROABS  --   --   --   --  10.9*  ?HGB 9.0* 9.8* 9.7* 9.7* 10.0*  ?HCT 26.6* 28.8* 29.3* 29.9* 31.3*  ?MCV 92.7 93.8 95.4 96.8 98.1  ?PLT 173 171 202 182 152  ? ?Cardiac Enzymes: ?No results for input(s): CKTOTAL, CKMB, CKMBINDEX, TROPONINI in the last 168 hours. ?BNP (last 3 results) ?No results for  input(s): PROBNP in the last 8760 hours. ?CBG: ?Recent Labs  ?Lab 05/25/21 ?2348  ?GLUCAP 116*  ? ?D-Dimer: ?No results for input(s): DDIMER in the last 72 hours. ? ?Hgb A1c: ?No results for input(s): HGBA1C in the last 72 hours. ?Lipid Profile: ?No results for input(s): CHOL, HDL, LDLCALC, TRIG, CHOLHDL, LDLDIRECT  in the last 72 hours. ?Thyroid function studies: ?No results for input(s): TSH, T4TOTAL, T3FREE, THYROIDAB in the last 72 hours. ? ?Invalid input(s): FREET3 ? ?Anemia work up: ?No results for input(s): VITAMINB12, FOLATE, FERRITIN, TIBC, IRON, RETICCTPCT in the last 72 hours. ?Sepsis Labs: ?Recent Labs  ?Lab 05/26/21 ?0106 05/26/21 ?7510 05/27/21 ?2585 05/28/21 ?2778 05/30/21 ?2423  ?PROCALCITON  --  1.48  --   --   --   ?WBC 19.8*  --  14.7* 16.7* 13.2*  ? ? ?Microbiology ?Recent Results (from the past 240 hour(s))  ?CULTURE, BLOOD (ROUTINE X 2) w Reflex to ID Panel     Status: None  ? Collection Time: 05/25/21  9:49 PM  ? Specimen: BLOOD  ?Result Value Ref Range Status  ? Specimen Description BLOOD RIGHT ANTECUBITAL  Final  ? Special Requests   Final  ?  BOTTLES DRAWN AEROBIC AND ANAEROBIC Blood Culture adequate volume  ? Culture   Final  ?  NO GROWTH 5 DAYS ?Performed at [facility], [address], [place], [place] [address] ?  ? Report Status 05/30/2021 FINAL  Final  ?CULTURE, BLOOD (ROUTINE X 2) w Reflex to ID Panel     Status: None  ? Collection Time: 05/25/21  9:49 PM  ? Specimen: BLOOD  ?Result Value Ref Range Status  ? Specimen Description BLOOD LEFT ANTECUBITAL  Final  ? Special Requests   Final  ?  BOTTLES DRAWN AEROBIC AND ANAEROBIC Blood Culture adequate volume  ? Culture   Final  ?  NO GROWTH 5 DAYS ?Performed at [facility], [address], [place], [place] [address] ?  ? Report Status 05/30/2021 FINAL  Final  ?MRSA Next Gen by PCR, Nasal     Status: None  ? Collection Time: 05/25/21 11:30 PM  ? Specimen: Nasal Mucosa; Nasal Swab  ?Result Value Ref Range Status  ?  MRSA by PCR Next Gen NOT DETECTED NOT DETECTED Final  ?  Comment: (NOTE) ?The GeneXpert MRSA Assay (FDA approved for NASAL specimens only), ?is one component of a comprehensive MRSA colonization surveillanc

## 2021-05-31 NOTE — Progress Notes (Signed)
Pt transferred from 2A, report received. Sitter at bedside. No sign of acute distress noted at this time. ?

## 2021-05-31 NOTE — Progress Notes (Signed)
Patient comfort care. Clarified with Sharion Settler, NP regarding oxygen order. Per NP, oxygen can be given upon request of the family or patient. Will keep patient on oxygen. ? ?@2250 : Pt agitated and continues to remove nasal cannula. Nasal cannula removed by primary RN. ?

## 2021-06-01 DIAGNOSIS — S72002A Fracture of unspecified part of neck of left femur, initial encounter for closed fracture: Secondary | ICD-10-CM | POA: Diagnosis not present

## 2021-06-01 DIAGNOSIS — J690 Pneumonitis due to inhalation of food and vomit: Secondary | ICD-10-CM | POA: Diagnosis not present

## 2021-06-01 DIAGNOSIS — J9601 Acute respiratory failure with hypoxia: Secondary | ICD-10-CM | POA: Diagnosis not present

## 2021-06-01 NOTE — Progress Notes (Addendum)
Manufacturing engineer (ACC) ? ?Referral received for EOL care at [facility]. ? ?Visited pt at bedside.  ? ?Left a message for his guardian to acknowledge referral.  ? ?Attempted to contact sister, no answer and no option to leave VM.  ? ?There is not a bed to offer today but he is eligible for residential hospice.  ? ?ACC will update guardian and hospital staff once there is a bed open.  ? ?Thank you, ?Venia Carbon BSN, RN ?[facility] Liaison  ?

## 2021-06-01 NOTE — Progress Notes (Addendum)
? ? ? ?Progress Note  ? ? ?Kota [name]  HYW:[id number] DOB: Sep 02, 1945  DOA: 06/04/2021 ?PCP: Remi Haggard, FNP  ? ? ? ? ?Brief Narrative:  ? ? ?Medical records reviewed and are as summarized below: ? ? ?76 year old man with past medical history of COPD, dementia, hepatitis C, cirrhosis and malnutrition and lung cancer presents with a fall and found to have a left hip fracture.  Dr. Harlow Mares brought to the operating room on 05/30/2021. ? ? ? ? ? ?Assessment/Plan:  ? ?Principal Problem: ?  Closed left hip fracture ([place]) ?Active Problems: ?  Aspiration pneumonia ([place]) ?  Acute respiratory failure with hypoxia ([place]) ?  Obstructive chronic bronchitis without exacerbation ([place]) ?  BPH (benign prostatic hyperplasia) ?  Long QT interval ?  Hypothyroidism ?  Dementia with behavioral disturbance ([place]) ?  Hyponatremia ?  Anemia, unspecified ?  Elevated liver function tests ?  Malnutrition of moderate degree ? ? ?Nutrition Problem: Moderate Malnutrition ?Etiology: chronic illness (liver cirrhosis, h/o ETOH abuse, hepatitis C, COPD, dementia) ? ?Signs/Symptoms: moderate fat depletion, severe muscle depletion ? ? ?Body mass index is 18.01 kg/m?. ? 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 ? ?PLAN ? ?MOST form has ready been reviewed.  ?Continue comfort measures. ?Consult hospice team to assist with disposition to hospice house. ?Plan discussed with his sister, Mardene Celeste. ? ? ?Diet Order   ? ?       ?  Diet NPO time specified  Diet effective now       ?  ? ?  ?  ? ?  ? ? ? ? ? ? ?Consultants: ?Palliative care ?Orthopedic surgeon ? ?Procedures: ?None ? ? ? ?Medications:   ? ? chlorhexidine  15 mL Mouth Rinse BID  ? mouth rinse  15 mL Mouth Rinse q12n4p  ? OLANZapine zydis  10 mg Oral QHS  ? umeclidinium bromide  1 puff Inhalation Daily  ? ?Continuous Infusions: ? ? ? ? ?Anti-infectives (From admission, onward)  ? ? Start     Dose/Rate Route Frequency Ordered Stop  ? 05/25/21 2230  cefTRIAXone (ROCEPHIN) 1 g in sodium chloride 0.9 % 100 mL IVPB       ? 1 g ?200 mL/hr over 30 Minutes Intravenous Every 24 hours 05/25/21 2141 05/31/21 0000  ? 06/07/2021 1800  ceFAZolin (ANCEF) IVPB 1 g/50 mL premix       ? 1 g ?100 mL/hr over 30 Minutes Intravenous Every 6 hours 05/25/2021 1407 05/21/21 0621  ? 06/12/2021 1143  ceFAZolin (ANCEF) 2-4 GM/100ML-% IVPB       ?Note to Pharmacy: Olena Mater F: cabinet override  ?    05/23/2021 1143 05/28/2021 1235  ? 06/08/2021 0724  ceFAZolin (ANCEF) IVPB 2g/100 mL premix       ? 2 g ?200 mL/hr over 30 Minutes Intravenous 30 min pre-op 05/28/2021 0724 05/28/2021 1218  ? ?  ? ? ? ? ? ? ? ? ? ?Family Communication/Anticipated D/C date and plan/Code Status  ? ?DVT prophylaxis: SCDs Start: 05/30/2021 1408 ?SCDs Start: 06/12/2021 0542 ? ? ?  Code Status: DNR ? ?Family Communication: None ?Disposition Plan: Plan to discharge to hospice house ? ? ?Status is: Inpatient ?Remains inpatient appropriate because: Comfort measures ? ? ? ? ? ? ?  Subjective:  ? ?He is unable to provide any history because of confusion.  His nurse was at the bedside. ? ?Objective:  ? ? ?Vitals:  ? 05/31/21 1725 06/01/21 0300 06/01/21 0555 06/01/21 0645  ?BP: 116/60   91/75  ?Pulse: (!) 109   (!) 108  ?Resp: 16 20    ?Temp: 98 ?F (36.7 ?C)     ?TempSrc:      ?SpO2: 95%  90%   ?Weight:      ?Height:      ? ?No data found. ? ? ?Intake/Output Summary (Last 24 hours) at 06/01/2021 1038 ?Last data filed at 06/01/2021 6759 ?Gross per 24 hour  ?Intake --  ?Output 800 ml  ?Net -800 ml  ? ?Filed Weights  ? 05/26/2021 0541 06/07/2021 1142 05/26/21 0000  ?Weight: 55.6 kg 55.6 kg 50.6 kg  ? ? ?Exam: ? ? ?GEN: NAD ?SKIN: Warm  and dry ?EYES: Anicteric ?ENT: MMM ?CV: RRR ?PULM: CTA B ?ABD: soft, ND, NT, +BS ?CNS: Alert, confused  ?EXT: No edema or tenderness ? ? ? ? ? ? ?Data Reviewed:  ? ?I have personally reviewed following labs and imaging studies: ? ?Labs: ?Labs show the following:  ? ?Basic Metabolic Panel: ?Recent Labs  ?Lab 05/26/21 ?0106 05/27/21 ?1638 05/28/21 ?0617 05/30/21 ?4665  ?NA 129* 134* 135 144  ?K 4.7 4.4 4.1 4.2  ?CL 101 104 107 113*  ?CO2 23 23 24 27   ?GLUCOSE 119* 85 120* 118*  ?BUN 34* 36* 45* 34*  ?CREATININE 1.02 0.85 1.06 0.76  ?CALCIUM 8.9 8.5* 8.8* 9.4  ?MG 1.7  --   --  1.9  ?PHOS  --  3.1  --  2.6  ? ?GFR ?Estimated Creatinine Clearance: 54.5 mL/min (by C-G formula based on SCr of 0.76 mg/dL). ?Liver Function Tests: ?No results for input(s): AST, ALT, ALKPHOS, BILITOT, PROT, ALBUMIN in the last 168 hours. ? ?No results for input(s): LIPASE, AMYLASE in the last 168 hours. ?No results for input(s): AMMONIA in the last 168 hours. ?Coagulation profile ?No results for input(s): INR, PROTIME in the last 168 hours. ? ?CBC: ?Recent Labs  ?Lab 05/26/21 ?0106 05/27/21 ?9935 05/28/21 ?0617 05/30/21 ?7017  ?WBC 19.8* 14.7* 16.7* 13.2*  ?NEUTROABS  --   --   --  10.9*  ?HGB 9.8* 9.7* 9.7* 10.0*  ?HCT 28.8* 29.3* 29.9* 31.3*  ?MCV 93.8 95.4 96.8 98.1  ?PLT 171 202 182 152  ? ?Cardiac Enzymes: ?No results for input(s): CKTOTAL, CKMB, CKMBINDEX, TROPONINI in the last 168 hours. ?BNP (last 3 results) ?No results for input(s): PROBNP in the last 8760 hours. ?CBG: ?Recent Labs  ?Lab 05/25/21 ?2348  ?GLUCAP 116*  ? ?D-Dimer: ?No results for input(s): DDIMER in the last 72 hours. ? ?Hgb A1c: ?No results for input(s): HGBA1C in the last 72 hours. ?Lipid Profile: ?No results for input(s): CHOL, HDL, LDLCALC, TRIG, CHOLHDL, LDLDIRECT in the last 72 hours. ?Thyroid function studies: ?No results for input(s): TSH, T4TOTAL, T3FREE, THYROIDAB in the last 72 hours. ? ?Invalid input(s): FREET3 ? ?Anemia work up: ?No results for input(s):  VITAMINB12, FOLATE, FERRITIN, TIBC, IRON, RETICCTPCT in the last 72 hours. ?Sepsis Labs: ?Recent Labs  ?Lab 05/26/21 ?0106 05/26/21 ?7939 05/27/21 ?0300 05/28/21 ?9233 05/30/21 ?0076  ?PROCALCITON  --  1.48  --   --   --   ?WBC 19.8*  --  14.7* 16.7* 13.2*  ? ? ?Microbiology ?Recent Results (from the past 240 hour(s))  ?CULTURE, BLOOD (ROUTINE X 2) w Reflex  to ID Panel     Status: None  ? Collection Time: 05/25/21  9:49 PM  ? Specimen: BLOOD  ?Result Value Ref Range Status  ? Specimen Description BLOOD RIGHT ANTECUBITAL  Final  ? Special Requests   Final  ?  BOTTLES DRAWN AEROBIC AND ANAEROBIC Blood Culture adequate volume  ? Culture   Final  ?  NO GROWTH 5 DAYS ?Performed at [facility], [address], [place], [place] [address] ?  ? Report Status 05/30/2021 FINAL  Final  ?CULTURE, BLOOD (ROUTINE X 2) w Reflex to ID Panel     Status: None  ? Collection Time: 05/25/21  9:49 PM  ? Specimen: BLOOD  ?Result Value Ref Range Status  ? Specimen Description BLOOD LEFT ANTECUBITAL  Final  ? Special Requests   Final  ?  BOTTLES DRAWN AEROBIC AND ANAEROBIC Blood Culture adequate volume  ? Culture   Final  ?  NO GROWTH 5 DAYS ?Performed at [facility], [address] [place], [place] [address] ?  ? Report Status 05/30/2021 FINAL  Final  ?MRSA Next Gen by PCR, Nasal     Status: None  ? Collection Time: 05/25/21 11:30 PM  ? Specimen: Nasal Mucosa; Nasal Swab  ?Result Value Ref Range Status  ? MRSA by PCR Next Gen NOT DETECTED NOT DETECTED Final  ?  Comment: (NOTE) ?The GeneXpert MRSA Assay (FDA approved for NASAL specimens only), ?is one component of a comprehensive MRSA colonization surveillance ?program. It is not intended to diagnose MRSA infection nor to guide ?or monitor treatment for MRSA infections. ?Test performance is not FDA approved in patients less than 2 years ?old. ?Performed at [facility], [place], ?[place] [address] ?  ? ? ?Procedures and diagnostic  studies: ? ?No results found. ? ? ? ? ? ? ? ? ? ? ? ? LOS: 12 days  ? ?Ashe Graybeal  ?[facility]  ? ?Pager on [email or web address]. If 7PM-7AM, please contact night-coverage at [email or web address] ? ? ? ? ?06/01/2021,

## 2021-06-01 NOTE — Plan of Care (Signed)

## 2021-06-01 NOTE — TOC Progression Note (Signed)
Transition of Care (TOC) - Progression Note  ? ? ?Patient Details  ?Name: Yechiel [name] ?MRN: [id number] ?Date of Birth: 1945/07/14 ? ?Transition of Care (TOC) CM/SW Contact  ?Avid Guillette E Pearl Berlinger, LCSW ?Phone Number: ?06/01/2021, 11:51 AM ? ?Clinical Narrative:   TOC consult for hospice home. ?Reached out to [employer] and mad referral.  ? ? ? ?Expected Discharge Plan: [place] ?Barriers to Discharge: Continued Medical Work up ? ?Expected Discharge Plan and Services ?Expected Discharge Plan: [place] ?  ?Discharge Planning Services: CM Consult ?Post Acute Care Choice: [place] ?Living arrangements for the past 2 months: Group Home ?                ?DME Arranged: N/A ?DME Agency: NA ?  ?  ?  ?HH Arranged: NA ?[place] Agency: NA ?  ?  ?  ? ? ?Social Determinants of Health (SDOH) Interventions ?  ? ?Readmission Risk Interventions ?   ? View : No data to display.  ?  ?  ?  ? ? ?

## 2021-06-02 DIAGNOSIS — J9601 Acute respiratory failure with hypoxia: Secondary | ICD-10-CM | POA: Diagnosis not present

## 2021-06-02 DIAGNOSIS — S72002A Fracture of unspecified part of neck of left femur, initial encounter for closed fracture: Secondary | ICD-10-CM | POA: Diagnosis not present

## 2021-06-02 DIAGNOSIS — J690 Pneumonitis due to inhalation of food and vomit: Secondary | ICD-10-CM | POA: Diagnosis not present

## 2021-06-02 MED ORDER — MORPHINE SULFATE (PF) 4 MG/ML IV SOLN
4.0000 mg | INTRAVENOUS | Status: DC | PRN
  Administered 2021-06-02: 4 mg via INTRAVENOUS
  Filled 2021-06-02: qty 1

## 2021-06-02 MED ORDER — LORAZEPAM 2 MG/ML IJ SOLN
2.0000 mg | INTRAMUSCULAR | Status: DC | PRN
  Administered 2021-06-02: 2 mg via INTRAVENOUS
  Filled 2021-06-02: qty 1

## 2021-06-02 NOTE — Progress Notes (Signed)
[facility]) Hospital Liaison Note ?  ?Unfortunately, Hospice Home is not able to offer a room today. [facility] Manager aware hospital liaison will follow up tomorrow or sooner if a room becomes available.  ?  ?Please do not hesitate to call with any hospice related questions.  ?  ?Thank you for the opportunity to participate in this patient's care. ?  ?Daphene Calamity, MSW ?[place] ?[phone] ? ?

## 2021-06-02 NOTE — Care Management Important Message (Signed)
Important Message ? ?Patient Details  ?Name: [name] ?MRN: [id number] ?Date of Birth: 1945/08/10 ? ? ?Medicare Important Message Given:  Other (see comment) ? ?Disposition is hospice home pending bed availability.  Medicare IM withheld at this time.  ? ? ?[name] ?06/02/2021, 10:49 AM ?

## 2021-06-02 NOTE — Plan of Care (Signed)
?  Problem: Health Behavior/Discharge Planning: ?Goal: Ability to manage health-related needs will improve ?06/02/2021 0604 by Charisse March, LPN ?Outcome: Not Progressing ?06/02/2021 0602 by Charisse March, LPN ?Outcome: Not Met (add Reason) ?  ?Problem: Clinical Measurements: ?Goal: Ability to maintain clinical measurements within normal limits will improve ?06/02/2021 0604 by Charisse March, LPN ?Outcome: Not Progressing ?06/02/2021 0602 by Charisse March, LPN ?Outcome: Not Met (add Reason) ?Goal: Will remain free from infection ?06/02/2021 0604 by Charisse March, LPN ?Outcome: Not Progressing ?06/02/2021 0602 by Charisse March, LPN ?Outcome: Not Progressing ?Goal: Respiratory complications will improve ?06/02/2021 0604 by Charisse March, LPN ?Outcome: Not Progressing ?06/02/2021 0602 by Charisse March, LPN ?Outcome: Not Progressing ?Goal: Cardiovascular complication will be avoided ?06/02/2021 0604 by Charisse March, LPN ?Outcome: Not Progressing ?06/02/2021 0602 by Charisse March, LPN ?Outcome: Not Progressing ?  ?Problem: Nutrition: ?Goal: Adequate nutrition will be maintained ?06/02/2021 0604 by Charisse March, LPN ?Outcome: Not Progressing ?06/02/2021 0602 by Charisse March, LPN ?Outcome: Not Met (add Reason) ?  ?Problem: Coping: ?Goal: Level of anxiety will decrease ?06/02/2021 0604 by Charisse March, LPN ?Outcome: Not Progressing ?06/02/2021 0602 by Charisse March, LPN ?Outcome: Not Progressing ?  ?Problem: Elimination: ?Goal: Will not experience complications related to bowel motility ?Outcome: Not Progressing ?Goal: Will not experience complications related to urinary retention ?Outcome: Not Progressing ?  ?Problem: Pain Managment: ?Goal: General experience of comfort will improve ?Outcome: Not Progressing ?  ?Problem: Safety: ?Goal: Ability to remain free from injury will improve ?06/02/2021 0604 by Charisse March, LPN ?Outcome: Not Progressing ?06/02/2021 0602 by Charisse March,  LPN ?Outcome: Not Progressing ?  ?Problem: Skin Integrity: ?Goal: Risk for impaired skin integrity will decrease ?Outcome: Not Progressing ?  ?Problem: Education: ?Goal: Knowledge of the prescribed therapeutic regimen will improve ?Outcome: Not Progressing ?Goal: Understanding of discharge needs will improve ?Outcome: Not Progressing ?Goal: Individualized Educational Video(s) ?Outcome: Not Progressing ?  ?Problem: Activity: ?Goal: Ability to avoid complications of mobility impairment will improve ?Outcome: Not Progressing ?Goal: Ability to tolerate increased activity will improve ?Outcome: Not Progressing ?  ?Problem: Clinical Measurements: ?Goal: Postoperative complications will be avoided or minimized ?Outcome: Not Progressing ?  ?Problem: Pain Management: ?Goal: Pain level will decrease with appropriate interventions ?Outcome: Not Progressing ?  ?Problem: Skin Integrity: ?Goal: Will show signs of wound healing ?Outcome: Not Progressing ?  ?

## 2021-06-02 NOTE — Progress Notes (Signed)
? ? ? ?Progress Note  ? ? ?[name]  YSA:[id number] DOB: August 09, 1945  DOA: 06/07/2021 ?PCP: Remi Haggard, FNP  ? ? ? ? ?Brief Narrative:  ? ? ?Medical records reviewed and are as summarized below: ? ? ?76 year old man with past medical history of COPD, dementia, hepatitis C, cirrhosis and malnutrition and lung cancer presents with a fall and found to have a left hip fracture.  Dr. Harlow Mares brought to the operating room on 06/02/2021. ? ? ? ? ? ?Assessment/Plan:  ? ?Principal Problem: ?  Closed left hip fracture ([place]) ?Active Problems: ?  Aspiration pneumonia ([place]) ?  Acute respiratory failure with hypoxia ([place]) ?  Obstructive chronic bronchitis without exacerbation ([place]) ?  BPH (benign prostatic hyperplasia) ?  Long QT interval ?  Hypothyroidism ?  Dementia with behavioral disturbance ([employer]) ?  Hyponatremia ?  Anemia, unspecified ?  Elevated liver function tests ?  Malnutrition of moderate degree ? ? ?Nutrition Problem: Moderate Malnutrition ?Etiology: chronic illness (liver cirrhosis, h/o ETOH abuse, hepatitis C, COPD, dementia) ? ?Signs/Symptoms: moderate fat depletion, severe muscle depletion ? ? ?Body mass index is 18.01 kg/m?. ? 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 ? ?PLAN ? ?Increase dose of IV Ativan and IV morphine for comfort ?Continue comfort measures ?Awaiting discharge to hospice house ? ?Diet Order   ? ?       ?  Diet NPO time specified  Diet effective now       ?  ? ?  ?  ? ?  ? ? ? ? ? ? ?Consultants: ?Palliative care ?Orthopedic surgeon ? ?Procedures: ?None ? ? ? ?Medications:  ? ? chlorhexidine  15 mL Mouth Rinse BID  ? mouth rinse   15 mL Mouth Rinse q12n4p  ? OLANZapine zydis  10 mg Oral QHS  ? umeclidinium bromide  1 puff Inhalation Daily  ? ?Continuous Infusions: ? ? ? ? ?Anti-infectives (From admission, onward)  ? ? Start     Dose/Rate Route Frequency Ordered Stop  ? 05/25/21 2230  cefTRIAXone (ROCEPHIN) 1 g in sodium chloride 0.9 % 100 mL IVPB       ? 1 g ?200 mL/hr over 30 Minutes Intravenous Every 24 hours 05/25/21 2141 05/31/21 0000  ? 06/09/2021 1800  ceFAZolin (ANCEF) IVPB 1 g/50 mL premix       ? 1 g ?100 mL/hr over 30 Minutes Intravenous Every 6 hours 06/05/2021 1407 05/21/21 0621  ? 05/23/2021 1143  ceFAZolin (ANCEF) 2-4 GM/100ML-% IVPB       ?Note to Pharmacy: Olena Mater F: cabinet override  ?    05/31/2021 1143 06/08/2021 1235  ? 05/31/2021 0724  ceFAZolin (ANCEF) IVPB 2g/100 mL premix       ? 2 g ?200 mL/hr over 30 Minutes Intravenous 30 min pre-op 05/21/2021 0724 06/07/2021 1218  ? ?  ? ? ? ? ? ? ? ? ? ?Family Communication/Anticipated D/C date and plan/Code Status  ? ?DVT prophylaxis: SCDs Start: 05/21/2021 1408 ?SCDs Start: 05/21/2021 0542 ? ? ?  Code Status: DNR ? ?Family Communication: None ?Disposition Plan: Plan to discharge to hospice house ? ? ?Status is: Inpatient ?Remains inpatient appropriate because: Comfort measures ? ? ? ? ? ? ?Subjective:  ? ?Interval events  noted.  He is unable to provide any history because of confusion ? ?Objective:  ? ? ?Vitals:  ? 06/01/21 0555 06/01/21 0645 06/01/21 2004 06/02/21 0820  ?BP:  91/75 (!) 136/107 131/74  ?Pulse:  (!) 108 (!) 115 (!) 104  ?Resp:   20 20  ?Temp:   98.2 ?F (36.8 ?C) 97.9 ?F (36.6 ?C)  ?TempSrc:   Oral   ?SpO2: 90%  93% 94%  ?Weight:      ?Height:      ? ?No data found. ? ? ?Intake/Output Summary (Last 24 hours) at 06/02/2021 1155 ?Last data filed at 06/02/2021 0020 ?Gross per 24 hour  ?Intake --  ?Output 450 ml  ?Net -450 ml  ? ?Filed Weights  ? 06/09/2021 0541 06/09/2021 1142 05/26/21 0000  ?Weight: 55.6 kg 55.6 kg 50.6 kg  ? ? ?Exam: ? ?GEN: NAD ?SKIN: Warm and dry ?EYES: No pallor  no icterus ?ENT: MMM ?CV: RRR ?PULM: CTA B ?ABD: soft, ND, NT, +BS ?CNS: Alert, confused non focal ?EXT: No edema or tenderness ? ? ? ? ? ?Data Reviewed:  ? ?I have personally reviewed following labs and imaging studies: ? ?Labs: ?Labs show the following:  ? ?Basic Metabolic Panel: ?Recent Labs  ?Lab 05/27/21 ?5277 05/28/21 ?0617 05/30/21 ?8242  ?NA 134* 135 144  ?K 4.4 4.1 4.2  ?CL 104 107 113*  ?CO2 23 24 27   ?GLUCOSE 85 120* 118*  ?BUN 36* 45* 34*  ?CREATININE 0.85 1.06 0.76  ?CALCIUM 8.5* 8.8* 9.4  ?MG  --   --  1.9  ?PHOS 3.1  --  2.6  ? ?GFR ?Estimated Creatinine Clearance: 54.5 mL/min (by C-G formula based on SCr of 0.76 mg/dL). ?Liver Function Tests: ?No results for input(s): AST, ALT, ALKPHOS, BILITOT, PROT, ALBUMIN in the last 168 hours. ? ?No results for input(s): LIPASE, AMYLASE in the last 168 hours. ?No results for input(s): AMMONIA in the last 168 hours. ?Coagulation profile ?No results for input(s): INR, PROTIME in the last 168 hours. ? ?CBC: ?Recent Labs  ?Lab 05/27/21 ?3536 05/28/21 ?0617 05/30/21 ?1443  ?WBC 14.7* 16.7* 13.2*  ?NEUTROABS  --   --  10.9*  ?HGB 9.7* 9.7* 10.0*  ?HCT 29.3* 29.9* 31.3*  ?MCV 95.4 96.8 98.1  ?PLT 202 182 152  ? ?Cardiac Enzymes: ?No results for input(s): CKTOTAL, CKMB, CKMBINDEX, TROPONINI in the last 168 hours. ?BNP (last 3 results) ?No results for input(s): PROBNP in the last 8760 hours. ?CBG: ?No results for input(s): GLUCAP in the last 168 hours. ? ?D-Dimer: ?No results for input(s): DDIMER in the last 72 hours. ? ?Hgb A1c: ?No results for input(s): HGBA1C in the last 72 hours. ?Lipid Profile: ?No results for input(s): CHOL, HDL, LDLCALC, TRIG, CHOLHDL, LDLDIRECT in the last 72 hours. ?Thyroid function studies: ?No results for input(s): TSH, T4TOTAL, T3FREE, THYROIDAB in the last 72 hours. ? ?Invalid input(s): FREET3 ? ?Anemia work up: ?No results for input(s): VITAMINB12, FOLATE, FERRITIN, TIBC, IRON, RETICCTPCT in the last 72 hours. ?Sepsis Labs: ?Recent Labs   ?Lab 05/27/21 ?1540 05/28/21 ?0617 05/30/21 ?0867  ?WBC 14.7* 16.7* 13.2*  ? ? ?Microbiology ?Recent Results (from the past 240 hour(s))  ?CULTURE, BLOOD (ROUTINE X 2) w Reflex to ID Panel     Status: None  ? Collection Time: 05/25/21  9:49 PM  ? Specimen: BLOOD  ?Result Value Ref Range Status  ? Specimen Description BLOOD RIGHT ANTECUBITAL  Final  ? Special Requests   Final  ?  BOTTLES DRAWN  AEROBIC AND ANAEROBIC Blood Culture adequate volume  ? Culture   Final  ?  NO GROWTH 5 DAYS ?Performed at [facility], [address], [place], [place] [address] ?  ? Report Status 05/30/2021 FINAL  Final  ?CULTURE, BLOOD (ROUTINE X 2) w Reflex to ID Panel     Status: None  ? Collection Time: 05/25/21  9:49 PM  ? Specimen: BLOOD  ?Result Value Ref Range Status  ? Specimen Description BLOOD LEFT ANTECUBITAL  Final  ? Special Requests   Final  ?  BOTTLES DRAWN AEROBIC AND ANAEROBIC Blood Culture adequate volume  ? Culture   Final  ?  NO GROWTH 5 DAYS ?Performed at [facility], [address], [place], [place] [address] ?  ? Report Status 05/30/2021 FINAL  Final  ?MRSA Next Gen by PCR, Nasal     Status: None  ? Collection Time: 05/25/21 11:30 PM  ? Specimen: Nasal Mucosa; Nasal Swab  ?Result Value Ref Range Status  ? MRSA by PCR Next Gen NOT DETECTED NOT DETECTED Final  ?  Comment: (NOTE) ?The GeneXpert MRSA Assay (FDA approved for NASAL specimens only), ?is one component of a comprehensive MRSA colonization surveillance ?program. It is not intended to diagnose MRSA infection nor to guide ?or monitor treatment for MRSA infections. ?Test performance is not FDA approved in patients less than 2 years ?old. ?Performed at [facility], [place], ?[place] [address] ?  ? ? ?Procedures and diagnostic studies: ? ?No results found. ? ? ? ? ? ? ? ? ? ? ? ? LOS: 13 days  ? ?[name]  ?[facility]  ? ?Pager on [email or web address]. If 7PM-7AM, please contact night-coverage at  [email or web address] ? ? ? ? ?06/02/2021, 11:55 AM  ? ? ? ? ? ? ? ? ? ?

## 2021-06-03 ENCOUNTER — Inpatient Hospital Stay: Payer: Medicare Other

## 2021-06-03 ENCOUNTER — Inpatient Hospital Stay: Payer: Medicare Other | Admitting: Oncology

## 2021-06-03 DIAGNOSIS — C3491 Malignant neoplasm of unspecified part of right bronchus or lung: Secondary | ICD-10-CM

## 2021-06-16 NOTE — Progress Notes (Signed)
?   06/27/21 0450  ?Attending Physican Contact  ?Attending Physician Notified Y  ?Attending Physician (First and Last Name) Neomia Glass NP  ?Post Mortem Checklist  ?Date of Death 06-27-2021  ?Time of Death 66  ?Pronounced By Floyde Parkins RN  ?Next of kin notified Yes  ?Name of next of kin notified of death Sheral Flow  ?Contact Person's Relationship to Patient Sister  ?Contact Person's Phone Number [phone]  ?Family Communication Notes  ?(Legal guardian Kalman Drape called; On call social worker Artist Pais returned call and will notify Kalman Drape.)  ?Was the patient a No Code Blue or a Limited Code Blue? Yes  ?Did the patient die unattended? Yes  ?Patient restrained? Not applicable  ?[employer] (previously known as [employer])  ?Notification Date 06-27-21  ?Notification Time 0457  ?[facility] Number [phone]  ?Is patient a potential donor? N  ?Medical Examiner  ?Is this a medical examiner's case? N  ? ? ?

## 2021-06-16 NOTE — Death Summary Note (Addendum)
? ?DEATH SUMMARY  ? ?Patient Details  ?Name: [name] ?MRN: [id number] ?DOB: Dec 19, 1945 ?ZHY:QMVHQIO, Jordan Likes, FNP ?Admission/Discharge Information  ? ?Admit Date:  06/14/2021  ?Date of Death: Date of Death: Jun 28, 2021  ?Time of Death: Time of Death: 72  ?Length of Stay: 14  ? ?Principle Cause of death: Stage IVb lung cancer ? ?Hospital Diagnoses: ?Principal Problem: ?  Adenocarcinoma, lung, right ([place]) ?Active Problems: ?  Closed left hip fracture ([place]) ?  Aspiration pneumonia ([place]) ?  Acute respiratory failure with hypoxia ([place]) ?  Obstructive chronic bronchitis without exacerbation ([place]) ?  BPH (benign prostatic hyperplasia) ?  Long QT interval ?  Hypothyroidism ?  Dementia with behavioral disturbance ([place]) ?  Chronic liver disease and cirrhosis ([place]) ?  Hyponatremia ?  Anemia, unspecified ?  Elevated liver function tests ?  Malnutrition of moderate degree ? ? ?Hospital Course: ? ?  ?76 year old man with past medical history of COPD, dementia with behavioral changes, hepatitis C, liver cirrhosis, moderate protein calorie malnutrition, stage IVb lung cancer, history of laryngeal cancer, BPH, hypothyroidism, chronic anemia, who presented to the hospital with left hip pain after a mechanical fall.   ? ?He was found to have left hip fracture.  He was treated with analgesics and he underwent left intramedullary nail intertrochanteric left hip fracture on Jun 14, 2021.  Hospital course was complicated by aspiration pneumonia and acute hypoxic respiratory failure.  He was treated with IV fluids, empiric IV antibiotics and oxygen via high flow nasal cannula.  He completed antibiotics and hypoxia improved. ?  ?Unfortunately, he had severe dysphagia and speech therapist recommended strict NPO.  He was confused and agitated which was likely from delirium in the setting of underlying dementia.  His legal guardian with DSS requested comfort measures and he was transitioned to comfort care.  Unfortunately, he expired while  awaiting placement to hospice house. ? ? ?  ? ? ?Procedures: Left intramedullary nail intertrochanteric left hip fracture ? ?Consultations: Orthopedic surgeon, palliative care team ? ?The results of significant diagnostics from this hospitalization (including imaging, microbiology, ancillary and laboratory) are listed below for reference.  ? ?Significant Diagnostic Studies: ?CT Angio Chest Pulmonary Embolism (PE) W or WO Contrast ? ?Result Date: 05/26/2021 ?CLINICAL DATA:  Positive D-dimer with shortness of breath EXAM: CT ANGIOGRAPHY CHEST WITH CONTRAST TECHNIQUE: Multidetector CT imaging of the chest was performed using the standard protocol during bolus administration of intravenous contrast. Multiplanar CT image reconstructions and MIPs were obtained to evaluate the vascular anatomy. RADIATION DOSE REDUCTION: This exam was performed according to the departmental dose-optimization program which includes automated exposure control, adjustment of the mA and/or kV according to patient size and/or use of iterative reconstruction technique. CONTRAST:  25mL OMNIPAQUE IOHEXOL 350 MG/ML SOLN COMPARISON:  Chest x-ray from the previous day. FINDINGS: Cardiovascular: Atherosclerotic calcifications of the thoracic aorta are noted. No aneurysmal dilatation or dissection is seen. No cardiac enlargement is noted. Coronary calcifications are seen. The pulmonary artery shows a normal branching pattern bilaterally. No filling defect to suggest pulmonary embolism is noted. Right chest wall port is noted. Mediastinum/Nodes: Thoracic inlet is within normal limits. Subcarinal lymph node is noted measuring 18 mm in short axis increased in size when compare with the prior exam. Small hilar nodes are seen although not significant by size criteria. The esophagus as visualized is within normal limits. Lungs/Pleura: Diffuse infiltrate is identified along the posterior aspect of the upper lobes bilaterally. This corresponds to that seen on  recent chest x-ray bilateral  pleural effusions right greater than left are seen with mild atelectatic changes in the bases bilaterally. Calcified densities are noted in the left base consistent with chronic aspiration. In the right upper lobe there is a somewhat spiculated mass lesion slightly smaller than that seen on prior PET-CT examination now measuring 3.0 x 1.1 cm. This showed hypermetabolic findings on recent PET-CT. No other focal nodules are seen. Upper Abdomen: Cirrhotic change of the liver is noted. Mild ascites is seen. No other focal abnormality is noted. Musculoskeletal: Degenerative changes of the thoracic spine are noted. No acute rib abnormality is seen. Review of the MIP images confirms the above findings. IMPRESSION: Persistent right upper lobe mass consistent with the known history of lung carcinoma. The overall size has decreased when compared with the prior PET-CT from October of 2022. Some slight prominence of the subcarinal lymph node is noted when compare with the prior PET-CT. Chronic calcifications in the left lower lobe consistent with chronic aspiration. Bilateral airspace opacities are noted in the upper lobes as well as the lower lobes likely related to aspiration. Bilateral pleural effusions are noted right greater than left. Cirrhotic change of the liver with underlying ascites. Aortic Atherosclerosis (ICD10-I70.0). Electronically Signed   By: Inez Catalina M.D.   On: 05/26/2021 02:42  ? ?US Venous Img Lower Bilateral (DVT) ? ?Result Date: 05/26/2021 ?CLINICAL DATA:  Positive D-dimer EXAM: BILATERAL LOWER EXTREMITY VENOUS DOPPLER ULTRASOUND TECHNIQUE: Gray-scale sonography with graded compression, as well as color Doppler and duplex ultrasound were performed to evaluate the lower extremity deep venous systems from the level of the common femoral vein and including the common femoral, femoral, profunda femoral, popliteal and calf veins including the posterior tibial, peroneal and  gastrocnemius veins when visible. The superficial great saphenous vein was also interrogated. Spectral Doppler was utilized to evaluate flow at rest and with distal augmentation maneuvers in the common femoral, femoral and popliteal veins. COMPARISON:  None. FINDINGS: RIGHT LOWER EXTREMITY Common Femoral Vein: No evidence of thrombus. Normal compressibility, respiratory phasicity and response to augmentation. Saphenofemoral Junction: No evidence of thrombus. Normal compressibility and flow on color Doppler imaging. Profunda Femoral Vein: No evidence of thrombus. Normal compressibility and flow on color Doppler imaging. Femoral Vein: No evidence of thrombus. Normal compressibility, respiratory phasicity and response to augmentation. Popliteal Vein: No evidence of thrombus. Normal compressibility, respiratory phasicity and response to augmentation. Calf Veins: No evidence of thrombus. Normal compressibility and flow on color Doppler imaging. Superficial Great Saphenous Vein: No evidence of thrombus. Normal compressibility. Venous Reflux:  None. Other Findings:  None. LEFT LOWER EXTREMITY Common Femoral Vein: No evidence of thrombus. Normal compressibility, respiratory phasicity and response to augmentation. Saphenofemoral Junction: No evidence of thrombus. Normal compressibility and flow on color Doppler imaging. Profunda Femoral Vein: No evidence of thrombus. Normal compressibility and flow on color Doppler imaging. Femoral Vein: No evidence of thrombus. Normal compressibility, respiratory phasicity and response to augmentation. Popliteal Vein: No evidence of thrombus. Normal compressibility, respiratory phasicity and response to augmentation. Calf Veins: No evidence of thrombus. Normal compressibility and flow on color Doppler imaging. Superficial Great Saphenous Vein: No evidence of thrombus. Normal compressibility. Venous Reflux:  None. Other Findings:  None. IMPRESSION: No evidence of deep venous thrombosis in  either lower extremity. Electronically Signed   By: Jacqulynn Cadet M.D.   On: 05/26/2021 08:00  ? ?DG Chest Port 1 View ? ?Result Date: 05/25/2021 ?CLINICAL DATA:  Hypoxia.  History of lung cancer. EXAM: PORTABLE

## 2021-06-16 NOTE — Significant Event (Signed)
? ? ?  OVERNIGHT PROGRESS REPORT ? ?Notified by RN that patient has expired at 0450 ? ?Patient was DNR and comfort care ? ?2 RN verified. ? ?RN contacting Legal Guardian to inform of patient's passing. ? ? ?Neomia Glass MHA, MSN, FNP-BC ?Nurse Practitioner ?[facility] ?[place] ?Pager [phone] ? ? ?

## 2021-06-16 DEATH — deceased

## 2021-12-17 IMAGING — XA IR IMAGING GUIDED PORT INSERTION
2 series · 2 of 2 positions shown · non-contrast
Comparison: PET-CT-04/26/2019

INDICATION: History of lung cancer. In need of durable intravenous access for
chemotherapy administration.

EXAM:
IMPLANTED PORT A CATH PLACEMENT WITH ULTRASOUND AND FLUOROSCOPIC
GUIDANCE

[Series 1: subclavian 3 fps · 1 of 1 slices shown (1 of 2)]
[im 1/1]
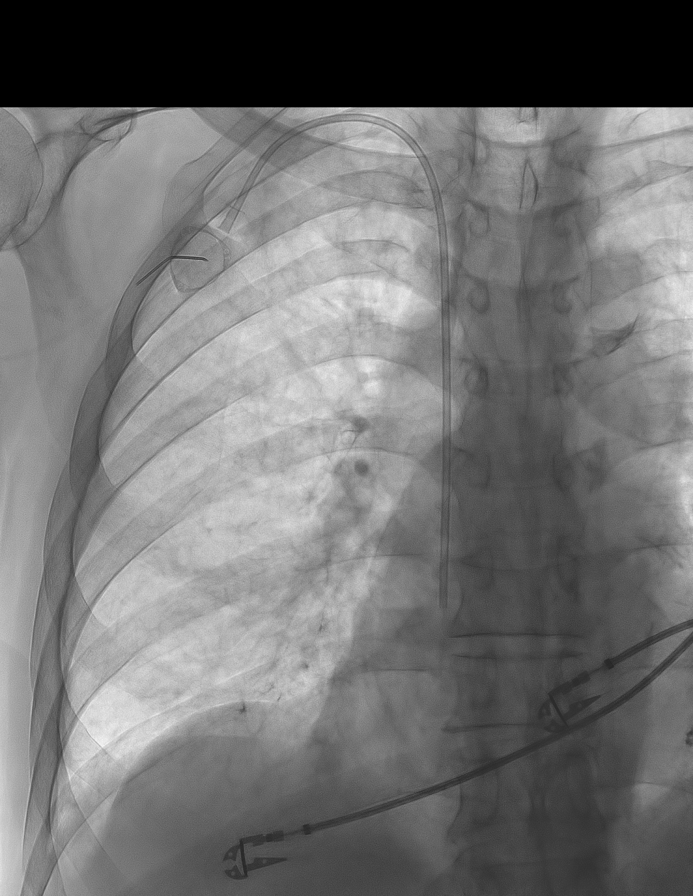

[Series 1: subclavian 3 fps · 1 of 1 slices shown (2 of 2)]
[im 1/1]
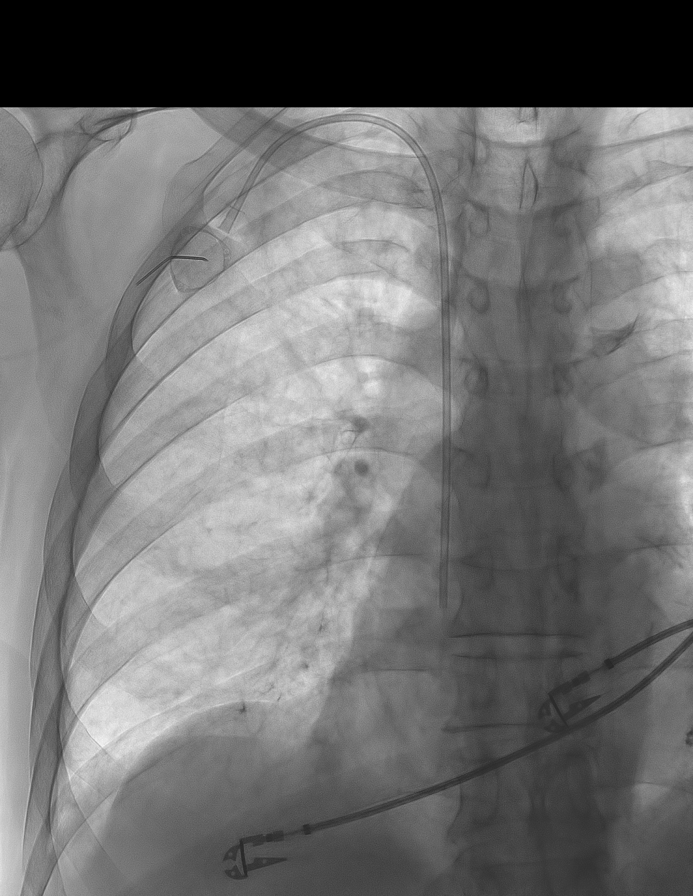

[2 of 2 positions shown; findings below may reference images not displayed]

MEDICATIONS:
Vancomycin 1 gm IV; The antibiotic was administered within an
appropriate time interval prior to skin puncture.

ANESTHESIA/SEDATION:
Moderate (conscious) sedation was employed during this procedure. A
total of Versed 1 mg and Fentanyl 50 mcg was administered
intravenously.

Moderate Sedation Time: 25 minutes. The patient's level of
consciousness and vital signs were monitored continuously by
radiology nursing throughout the procedure under my direct
supervision.

CONTRAST:  None

FLUOROSCOPY TIME:  18 seconds (1.8 mGy)

COMPLICATIONS:
None immediate.

PROCEDURE:
The procedure, risks, benefits, and alternatives were explained to
the patient. Questions regarding the procedure were encouraged and
answered. The patient understands and consents to the procedure.

The right neck and chest were prepped with chlorhexidine in a
sterile fashion, and a sterile drape was applied covering the
operative field. Maximum barrier sterile technique with sterile
gowns and gloves were used for the procedure. A timeout was
performed prior to the initiation of the procedure. Local anesthesia
was provided with 1% lidocaine with epinephrine.

After creating a small venotomy incision, a micropuncture kit was
utilized to access the internal jugular vein. Real-time ultrasound
guidance was utilized for vascular access including the acquisition
of a permanent ultrasound image documenting patency of the accessed
vessel. The microwire was utilized to measure appropriate catheter
length.

A subcutaneous port pocket was then created along the upper chest
wall utilizing a combination of sharp and blunt dissection. The
pocket was irrigated with sterile saline. A single lumen "standard
sized" power injectable port was chosen for placement. The 8 Fr
catheter was tunneled from the port pocket site to the venotomy
incision. The port was placed in the pocket. The external catheter
was trimmed to appropriate length. At the venotomy, an 8 Fr
peel-away sheath was placed over a guidewire under fluoroscopic
guidance. The catheter was then placed through the sheath and the
sheath was removed. Final catheter positioning was confirmed and
documented with a fluoroscopic spot radiograph. The port was
accessed with Kathrene Nu needle, aspirated and flushed with heparinized
saline.

The venotomy site was closed with an interrupted 4-0 Vicryl suture.
The port pocket incision was closed with interrupted 2-0 Vicryl
suture. The skin was opposed with a running subcuticular 4-0 Vicryl
suture. Dermabond and Mishkat were applied to both incisions.
Dressings were applied. The patient tolerated the procedure well
without immediate post procedural complication.
FINDINGS: After catheter placement, the tip lies within the superior
cavoatrial junction. The catheter aspirates and flushes normally and
is ready for immediate use.
IMPRESSION: Successful placement of a right internal jugular approach power
injectable Port-A-Cath. The catheter is ready for immediate use.

## 2021-12-20 IMAGING — MR MR HEAD WO/W CM
14 series · 45 of 48 positions shown · IV contrast (gadavist)
Comparison: Head CT September 30, 2016 and with

CLINICAL DATA: Lung cancer. Screening

EXAM:
MRI HEAD WITHOUT AND WITH CONTRAST
TECHNIQUE: Multiplanar, multiecho pulse sequences of the brain and surrounding
structures were obtained without and with intravenous contrast.
CONTRAST:  6mL GADAVIST GADOBUTROL 1 MMOL/ML IV SOLN

[Series 5: ax dwi_tracew · axial · 3.0mm · 0.60mm/px · z∈[-87,+64]mm · 4 of 48 slices shown]
[im 1/48]
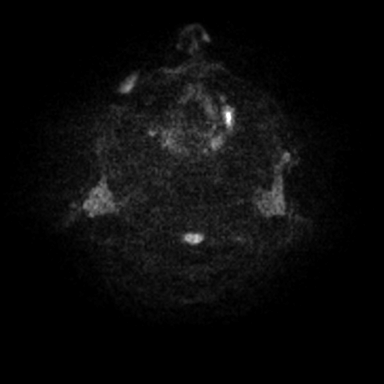
[im 16/48]
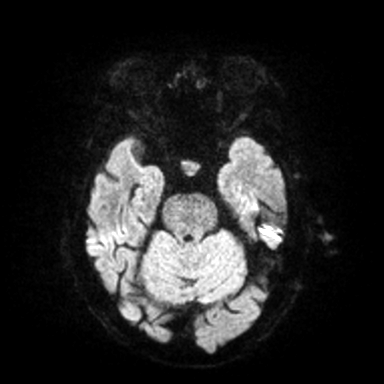
[im 32/48]
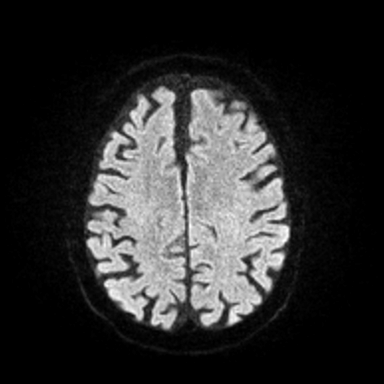
[im 48/48]
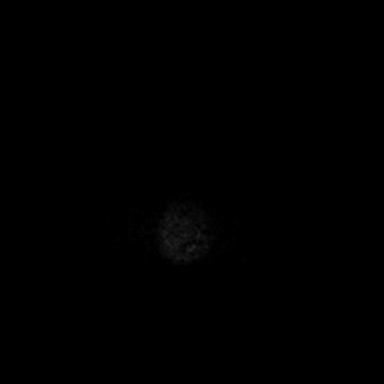

[Series 6: ax dwi_adc · axial · 3.0mm · 0.60mm/px · z∈[-87,+64]mm · 4 of 48 slices shown]
[im 1/48]
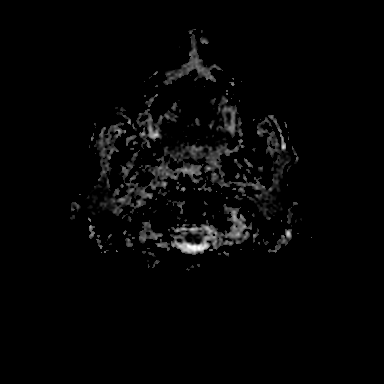
[im 16/48]
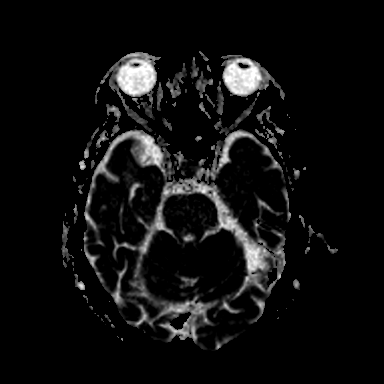
[im 32/48]
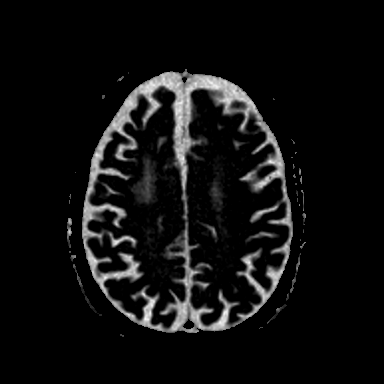
[im 48/48]
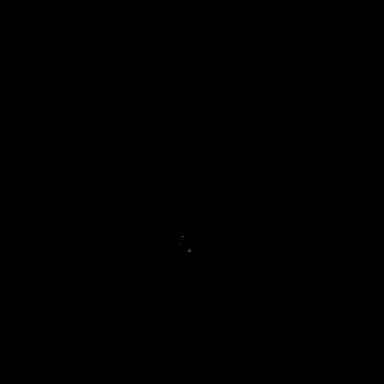

[Series 7: cor dwi_tracew · coronal · 5.0mm · 0.60mm/px · 2 of 40 slices shown]
[im 1/40]
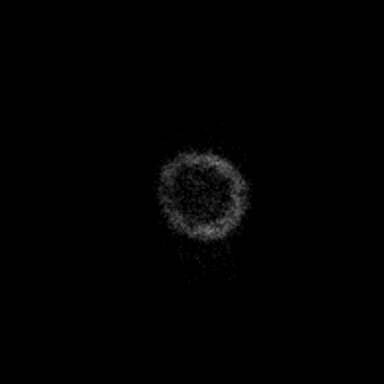
[im 40/40]
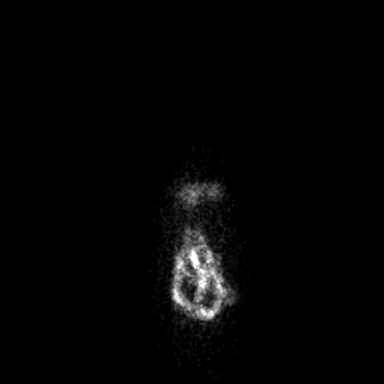

[Series 8: cor dwi_adc · coronal · 5.0mm · 0.60mm/px · 2 of 39 slices shown]
[im 1/39]
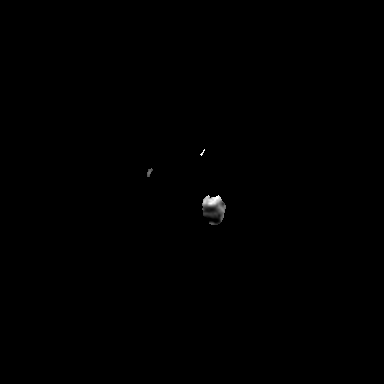
[im 39/39]
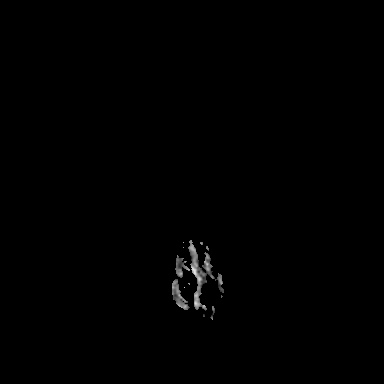

[Series 9: T1 · sagittal · 5.0mm · 0.62mm/px · 1 of 21 slices shown (1 of 2)]
[im 1/21]
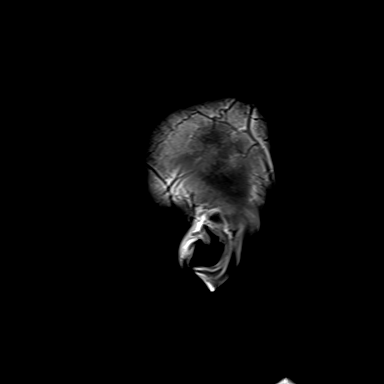

[Series 10: T2 · axial · 5.0mm · 0.53mm/px · 1 of 25 slices shown]
[im 1/25]
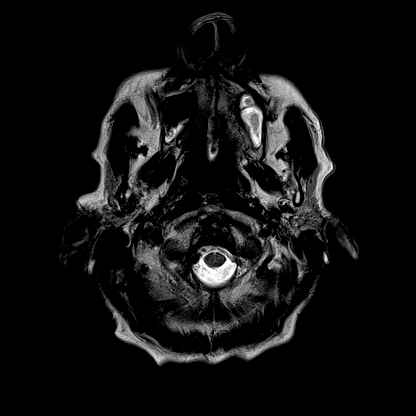

[Series 12: pha_images · axial · 3.0mm · 0.90mm/px · z∈[-93,+71]mm · 3 of 57 slices shown]
[im 1/57]
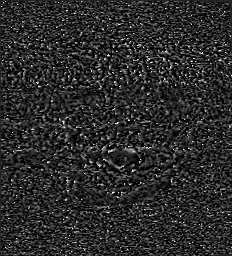
[im 29/57]
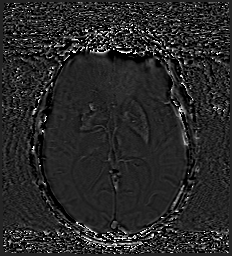
[im 57/57]
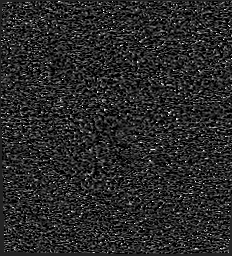

[Series 13: swi_images · axial · 3.0mm · 0.90mm/px · z∈[-96,+76]mm · 3 of 60 slices shown]
[im 1/60]
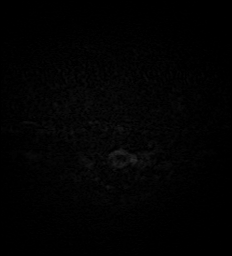
[im 30/60]
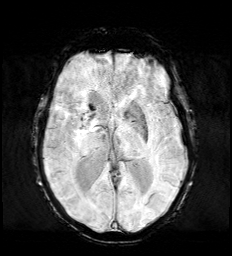
[im 60/60]
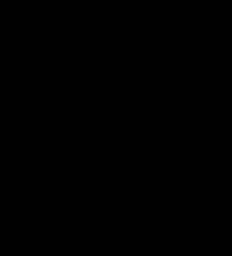

[Series 15: FLAIR · axial · 3.0mm · 0.53mm/px · z∈[-90,+67]mm · 3 of 55 slices shown]
[im 1/55]
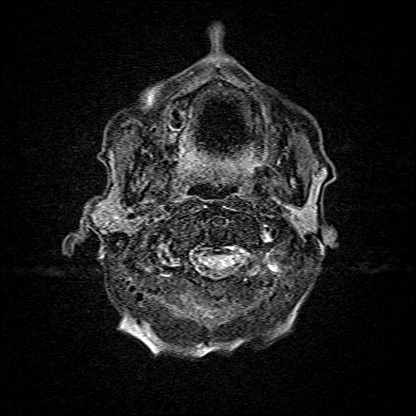
[im 28/55]
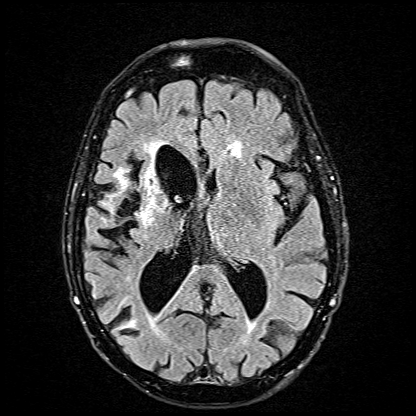
[im 55/55]
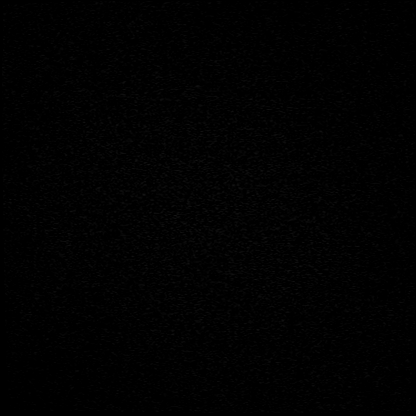

[Series 16: T1 · axial · 1.0mm · 0.98mm/px · z∈[-95,+76]mm · 8 of 176 slices shown (2 of 2)]
[im 1/176]
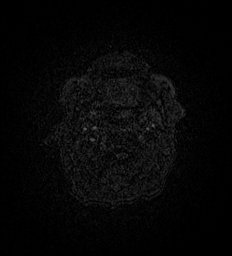
[im 20/176]
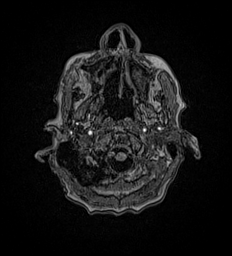
[im 59/176]
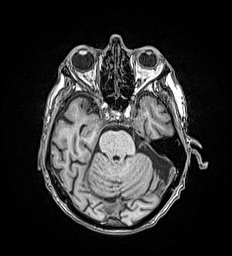
[im 78/176]
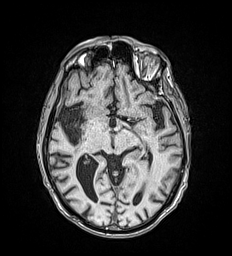
[im 98/176]
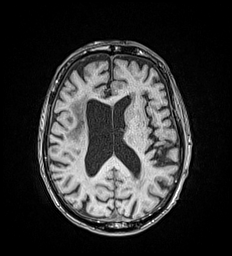
[im 117/176]
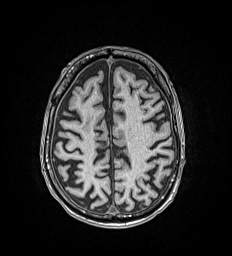
[im 156/176]
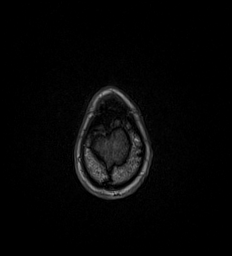
[im 176/176]
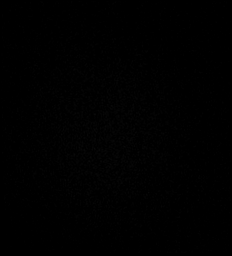

[Series 17: T2 post-contrast · coronal · 5.0mm · 0.57mm/px · 2 of 29 slices shown]
[im 1/29]
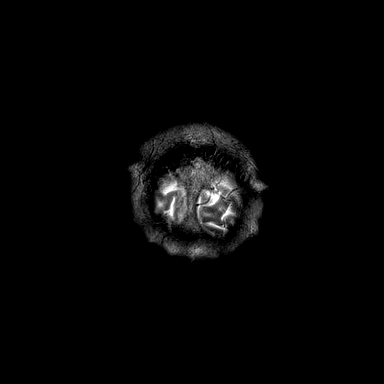
[im 29/29]
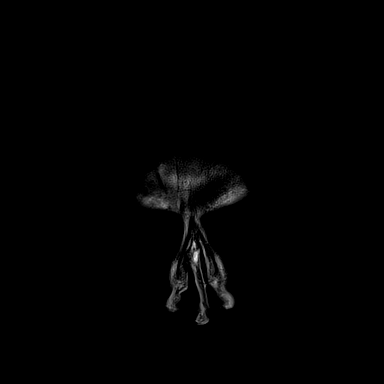

[Series 18: T1 post-contrast · axial · 1.0mm · 0.98mm/px · z∈[-95,+76]mm · 9 of 175 slices shown (1 of 3)]
[im 1/175]
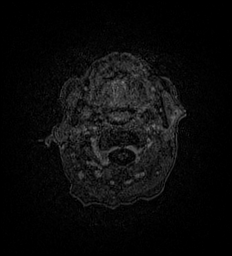
[im 20/175]
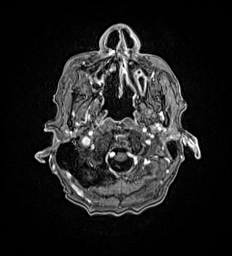
[im 39/175]
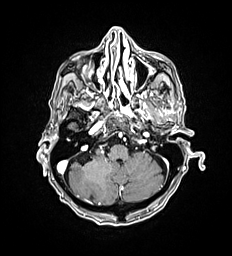
[im 59/175]
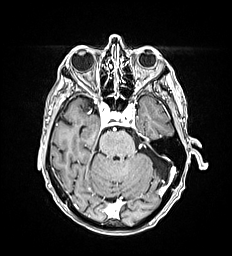
[im 78/175]
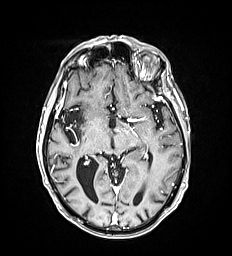
[im 97/175]
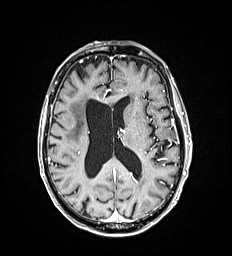
[im 117/175]
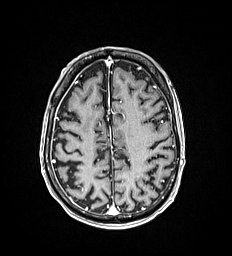
[im 155/175]
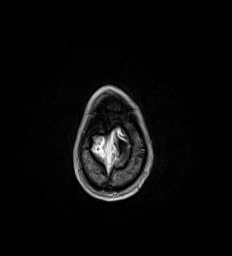
[im 175/175]
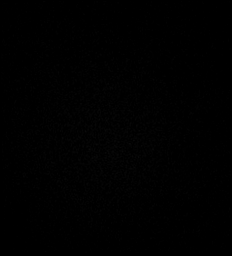

[Series 19: T1 post-contrast · coronal · 5.0mm · 0.57mm/px · 2 of 29 slices shown (2 of 3)]
[im 1/29]
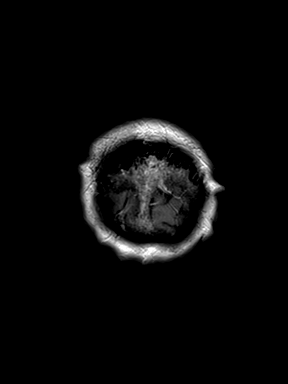
[im 29/29]
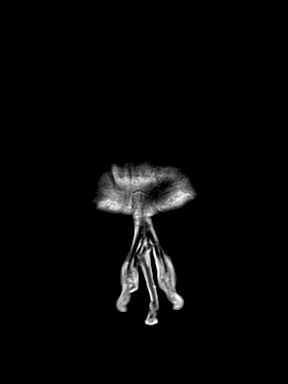

[Series 20: T1 post-contrast · sagittal · 5.0mm · 0.62mm/px · 1 of 21 slices shown (3 of 3)]
[im 1/21]
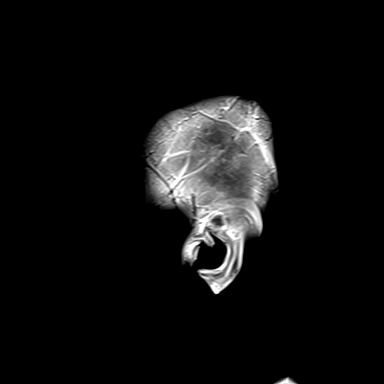

[45 of 48 positions shown; findings below may reference images not displayed]

FINDINGS: Brain: No acute infarction, hemorrhage, hydrocephalus or mass
lesion.

Area encephalomalacia and gliosis involving the right temporal lobe,
insula, basal ganglia and frontal lobe consistent with old right MCA
territory infarct with associated retraction of the right lateral
ventricle.

Scattered and confluent foci of T2 hyperintensity are seen within
the white matter of the cerebral hemispheres and within the pons,
nonspecific, most likely related to chronic small vessel ischemia.
Remote lacunar infarct is seen within the left thalamus.

Focal parafalcine dural thickening with heterogeneous contrast
enhancement is noted in the right frontal region (series 18, image
35) measuring up to 10 mm. This was present on prior head CT
performed September 30, 2016 and likely represents a small chronic
subdural hematoma.

Vascular: Normal flow voids.

Skull and upper cervical spine: Normal marrow signal.

Sinuses/Orbits: Mucosal thickening of the bilateral maxillary
sinuses and ethmoid cells. The orbits are maintained.

Other: None.
IMPRESSION: 1. No evidence of intracranial metastatic disease.
2. Parafalcine dural thickening with heterogeneous contrast
enhancement in the right frontal region, unchanged from prior CT,
likely representing small chronic subdural hematoma.
3. Encephalomalacia and gliosis involving the right temporal lobe,
insula, basal ganglia and frontal lobe consistent with old right MCA
territory infarct.
4. Moderate chronic small vessel ischemia.
5. Inflammatory paranasal sinus disease.
